# Patient Record
Sex: Female | Born: 1980 | Race: Black or African American | Hispanic: No | Marital: Single | Smoking: Former smoker
Health system: Southern US, Community
[De-identification: ages and names within clinical notes are randomized; demographics above are authoritative.]

## PROBLEM LIST (undated history)

## (undated) ENCOUNTER — Inpatient Hospital Stay (HOSPITAL_COMMUNITY): Payer: Self-pay

## (undated) DIAGNOSIS — R51 Headache: Secondary | ICD-10-CM

## (undated) DIAGNOSIS — O24419 Gestational diabetes mellitus in pregnancy, unspecified control: Secondary | ICD-10-CM

## (undated) DIAGNOSIS — R112 Nausea with vomiting, unspecified: Secondary | ICD-10-CM

## (undated) DIAGNOSIS — R519 Headache, unspecified: Secondary | ICD-10-CM

## (undated) DIAGNOSIS — K219 Gastro-esophageal reflux disease without esophagitis: Secondary | ICD-10-CM

## (undated) DIAGNOSIS — I10 Essential (primary) hypertension: Secondary | ICD-10-CM

## (undated) DIAGNOSIS — E1110 Type 2 diabetes mellitus with ketoacidosis without coma: Secondary | ICD-10-CM

## (undated) DIAGNOSIS — O139 Gestational [pregnancy-induced] hypertension without significant proteinuria, unspecified trimester: Secondary | ICD-10-CM

## (undated) DIAGNOSIS — E109 Type 1 diabetes mellitus without complications: Secondary | ICD-10-CM

## (undated) HISTORY — DX: Gestational diabetes mellitus in pregnancy, unspecified control: O24.419

## (undated) HISTORY — DX: Gestational (pregnancy-induced) hypertension without significant proteinuria, unspecified trimester: O13.9

## (undated) HISTORY — DX: Essential (primary) hypertension: I10

---

## 2005-09-27 ENCOUNTER — Emergency Department (HOSPITAL_COMMUNITY): Admission: EM | Admit: 2005-09-27 | Discharge: 2005-09-27 | Payer: Self-pay | Admitting: Family Medicine

## 2009-09-24 ENCOUNTER — Emergency Department (HOSPITAL_COMMUNITY): Admission: EM | Admit: 2009-09-24 | Discharge: 2009-09-25 | Payer: Self-pay | Admitting: Emergency Medicine

## 2009-09-29 ENCOUNTER — Emergency Department (HOSPITAL_COMMUNITY): Admission: EM | Admit: 2009-09-29 | Discharge: 2009-09-29 | Payer: Self-pay | Admitting: Emergency Medicine

## 2009-11-14 ENCOUNTER — Inpatient Hospital Stay (HOSPITAL_COMMUNITY): Admission: AD | Admit: 2009-11-14 | Discharge: 2009-11-14 | Payer: Self-pay | Admitting: Family Medicine

## 2009-11-14 ENCOUNTER — Ambulatory Visit: Payer: Self-pay | Admitting: Nurse Practitioner

## 2009-12-24 ENCOUNTER — Ambulatory Visit (HOSPITAL_COMMUNITY): Admission: RE | Admit: 2009-12-24 | Discharge: 2009-12-24 | Payer: Self-pay | Admitting: Family Medicine

## 2009-12-31 ENCOUNTER — Ambulatory Visit: Payer: Self-pay | Admitting: Obstetrics and Gynecology

## 2010-01-05 ENCOUNTER — Ambulatory Visit: Payer: Self-pay | Admitting: Obstetrics & Gynecology

## 2010-01-12 ENCOUNTER — Encounter: Admission: RE | Admit: 2010-01-12 | Discharge: 2010-01-23 | Payer: Self-pay | Admitting: Obstetrics and Gynecology

## 2010-01-12 ENCOUNTER — Ambulatory Visit: Payer: Self-pay | Admitting: Family Medicine

## 2010-01-19 ENCOUNTER — Encounter (INDEPENDENT_AMBULATORY_CARE_PROVIDER_SITE_OTHER): Payer: Self-pay | Admitting: *Deleted

## 2010-01-19 ENCOUNTER — Ambulatory Visit: Payer: Self-pay | Admitting: Obstetrics & Gynecology

## 2010-01-19 LAB — CONVERTED CEMR LAB
Hgb A1c MFr Bld: 6.5 % — ABNORMAL HIGH (ref <5.7)
TSH: 1.994 microintl units/mL (ref 0.350–4.500)

## 2010-01-26 ENCOUNTER — Encounter (INDEPENDENT_AMBULATORY_CARE_PROVIDER_SITE_OTHER): Payer: Self-pay | Admitting: *Deleted

## 2010-01-26 ENCOUNTER — Encounter
Admission: RE | Admit: 2010-01-26 | Discharge: 2010-03-09 | Payer: Self-pay | Source: Home / Self Care | Admitting: Obstetrics & Gynecology

## 2010-01-26 ENCOUNTER — Ambulatory Visit: Payer: Self-pay | Admitting: Obstetrics & Gynecology

## 2010-01-26 LAB — CONVERTED CEMR LAB
ALT: 15 units/L (ref 0–35)
AST: 14 units/L (ref 0–37)
Albumin: 3.7 g/dL (ref 3.5–5.2)
BUN: 4 mg/dL — ABNORMAL LOW (ref 6–23)
Calcium: 9.3 mg/dL (ref 8.4–10.5)
Chloride: 105 meq/L (ref 96–112)
Creatinine, Urine: 89.4 mg/dL
Hemoglobin: 12.5 g/dL (ref 12.0–15.0)
Potassium: 4 meq/L (ref 3.5–5.3)
RDW: 12.7 % (ref 11.5–15.5)
WBC: 10.7 10*3/uL — ABNORMAL HIGH (ref 4.0–10.5)

## 2010-01-27 ENCOUNTER — Encounter (INDEPENDENT_AMBULATORY_CARE_PROVIDER_SITE_OTHER): Payer: Self-pay | Admitting: *Deleted

## 2010-02-09 ENCOUNTER — Ambulatory Visit: Payer: Self-pay | Admitting: Obstetrics & Gynecology

## 2010-02-12 ENCOUNTER — Ambulatory Visit (HOSPITAL_COMMUNITY): Admission: RE | Admit: 2010-02-12 | Discharge: 2010-02-12 | Payer: Self-pay | Admitting: Obstetrics & Gynecology

## 2010-02-12 ENCOUNTER — Ambulatory Visit: Payer: Self-pay | Admitting: Obstetrics & Gynecology

## 2010-02-16 ENCOUNTER — Ambulatory Visit: Payer: Self-pay | Admitting: Obstetrics & Gynecology

## 2010-02-19 ENCOUNTER — Ambulatory Visit: Payer: Self-pay | Admitting: Obstetrics & Gynecology

## 2010-02-23 ENCOUNTER — Ambulatory Visit: Payer: Self-pay | Admitting: Family Medicine

## 2010-02-26 ENCOUNTER — Ambulatory Visit: Payer: Self-pay | Admitting: Obstetrics and Gynecology

## 2010-03-02 ENCOUNTER — Ambulatory Visit: Payer: Self-pay | Admitting: Obstetrics & Gynecology

## 2010-03-02 ENCOUNTER — Encounter (INDEPENDENT_AMBULATORY_CARE_PROVIDER_SITE_OTHER): Payer: Self-pay | Admitting: *Deleted

## 2010-03-05 ENCOUNTER — Ambulatory Visit: Payer: Self-pay | Admitting: Obstetrics and Gynecology

## 2010-03-09 ENCOUNTER — Encounter (INDEPENDENT_AMBULATORY_CARE_PROVIDER_SITE_OTHER): Payer: Self-pay | Admitting: *Deleted

## 2010-03-09 ENCOUNTER — Ambulatory Visit: Payer: Self-pay | Admitting: Obstetrics and Gynecology

## 2010-03-09 LAB — CONVERTED CEMR LAB
Chlamydia, DNA Probe: NEGATIVE
GC Probe Amp, Genital: NEGATIVE

## 2010-03-12 ENCOUNTER — Ambulatory Visit (HOSPITAL_COMMUNITY)
Admission: RE | Admit: 2010-03-12 | Discharge: 2010-03-12 | Payer: Self-pay | Source: Home / Self Care | Admitting: Obstetrics and Gynecology

## 2010-03-12 ENCOUNTER — Ambulatory Visit: Payer: Self-pay | Admitting: Obstetrics and Gynecology

## 2010-03-18 ENCOUNTER — Ambulatory Visit: Payer: Self-pay | Admitting: Obstetrics & Gynecology

## 2010-03-23 ENCOUNTER — Ambulatory Visit: Payer: Self-pay | Admitting: Obstetrics & Gynecology

## 2010-03-23 ENCOUNTER — Encounter: Admission: RE | Admit: 2010-03-23 | Discharge: 2010-05-26 | Payer: Self-pay | Source: Home / Self Care

## 2010-03-26 ENCOUNTER — Ambulatory Visit: Payer: Self-pay | Admitting: Obstetrics & Gynecology

## 2010-03-26 ENCOUNTER — Ambulatory Visit (HOSPITAL_COMMUNITY): Admission: RE | Admit: 2010-03-26 | Discharge: 2010-03-26 | Payer: Self-pay | Admitting: Family Medicine

## 2010-03-28 ENCOUNTER — Inpatient Hospital Stay (HOSPITAL_COMMUNITY)
Admission: AD | Admit: 2010-03-28 | Discharge: 2010-04-03 | Payer: Self-pay | Source: Home / Self Care | Attending: Obstetrics & Gynecology | Admitting: Obstetrics & Gynecology

## 2010-04-27 ENCOUNTER — Ambulatory Visit: Payer: Self-pay | Admitting: Obstetrics and Gynecology

## 2010-05-06 ENCOUNTER — Other Ambulatory Visit
Admission: RE | Admit: 2010-05-06 | Discharge: 2010-05-06 | Payer: Self-pay | Source: Home / Self Care | Admitting: Family Medicine

## 2010-05-06 ENCOUNTER — Ambulatory Visit
Admission: RE | Admit: 2010-05-06 | Discharge: 2010-05-06 | Payer: Self-pay | Source: Home / Self Care | Attending: Obstetrics and Gynecology | Admitting: Obstetrics and Gynecology

## 2010-05-07 ENCOUNTER — Encounter: Payer: Self-pay | Admitting: Obstetrics & Gynecology

## 2010-05-07 LAB — CONVERTED CEMR LAB: Trich, Wet Prep: NONE SEEN

## 2010-07-07 LAB — CBC
HCT: 34.1 % — ABNORMAL LOW (ref 36.0–46.0)
Hemoglobin: 13.5 g/dL (ref 12.0–15.0)
MCH: 32.9 pg (ref 26.0–34.0)
MCH: 33.1 pg (ref 26.0–34.0)
MCHC: 33 g/dL (ref 30.0–36.0)
MCHC: 33.2 g/dL (ref 30.0–36.0)
MCHC: 33.3 g/dL (ref 30.0–36.0)
MCV: 99.4 fL (ref 78.0–100.0)
MCV: 99.7 fL (ref 78.0–100.0)
Platelets: 187 10*3/uL (ref 150–400)
RDW: 13.5 % (ref 11.5–15.5)
RDW: 13.9 % (ref 11.5–15.5)
RDW: 13.9 % (ref 11.5–15.5)
WBC: 17.8 10*3/uL — ABNORMAL HIGH (ref 4.0–10.5)

## 2010-07-07 LAB — GLUCOSE, CAPILLARY
Glucose-Capillary: 103 mg/dL — ABNORMAL HIGH (ref 70–99)
Glucose-Capillary: 105 mg/dL — ABNORMAL HIGH (ref 70–99)
Glucose-Capillary: 106 mg/dL — ABNORMAL HIGH (ref 70–99)
Glucose-Capillary: 113 mg/dL — ABNORMAL HIGH (ref 70–99)
Glucose-Capillary: 113 mg/dL — ABNORMAL HIGH (ref 70–99)
Glucose-Capillary: 120 mg/dL — ABNORMAL HIGH (ref 70–99)
Glucose-Capillary: 122 mg/dL — ABNORMAL HIGH (ref 70–99)
Glucose-Capillary: 124 mg/dL — ABNORMAL HIGH (ref 70–99)
Glucose-Capillary: 131 mg/dL — ABNORMAL HIGH (ref 70–99)
Glucose-Capillary: 155 mg/dL — ABNORMAL HIGH (ref 70–99)
Glucose-Capillary: 169 mg/dL — ABNORMAL HIGH (ref 70–99)
Glucose-Capillary: 227 mg/dL — ABNORMAL HIGH (ref 70–99)
Glucose-Capillary: 49 mg/dL — ABNORMAL LOW (ref 70–99)
Glucose-Capillary: 51 mg/dL — ABNORMAL LOW (ref 70–99)
Glucose-Capillary: 63 mg/dL — ABNORMAL LOW (ref 70–99)
Glucose-Capillary: 63 mg/dL — ABNORMAL LOW (ref 70–99)
Glucose-Capillary: 77 mg/dL (ref 70–99)
Glucose-Capillary: 89 mg/dL (ref 70–99)

## 2010-07-07 LAB — COMPREHENSIVE METABOLIC PANEL
ALT: 14 U/L (ref 0–35)
AST: 31 U/L (ref 0–37)
Calcium: 7.9 mg/dL — ABNORMAL LOW (ref 8.4–10.5)
GFR calc Af Amer: 49 mL/min — ABNORMAL LOW (ref >60)
Sodium: 138 mEq/L (ref 135–145)
Total Protein: 5.1 g/dL — ABNORMAL LOW (ref 6.0–8.3)

## 2010-07-07 LAB — POCT URINALYSIS DIPSTICK
Bilirubin Urine: NEGATIVE
Glucose, UA: NEGATIVE mg/dL
Glucose, UA: NEGATIVE mg/dL
Ketones, ur: 15 mg/dL — AB
Nitrite: NEGATIVE
Nitrite: NEGATIVE
Protein, ur: NEGATIVE mg/dL
Specific Gravity, Urine: 1.02 (ref 1.005–1.030)
Urobilinogen, UA: 0.2 mg/dL (ref 0.0–1.0)
Urobilinogen, UA: 0.2 mg/dL (ref 0.0–1.0)

## 2010-07-07 LAB — URINALYSIS, MICROSCOPIC ONLY
Bilirubin Urine: NEGATIVE
Nitrite: NEGATIVE
Specific Gravity, Urine: 1.015 (ref 1.005–1.030)
Urobilinogen, UA: 0.2 mg/dL (ref 0.0–1.0)

## 2010-07-07 LAB — PROTEIN / CREATININE RATIO, URINE
Protein Creatinine Ratio: 0.27 — ABNORMAL HIGH (ref 0.00–0.15)
Total Protein, Urine: 11 mg/dL

## 2010-07-08 LAB — GLUCOSE, CAPILLARY: Glucose-Capillary: 124 mg/dL — ABNORMAL HIGH (ref 70–99)

## 2010-07-08 LAB — POCT URINALYSIS DIPSTICK
Bilirubin Urine: NEGATIVE
Bilirubin Urine: NEGATIVE
Glucose, UA: 100 mg/dL — AB
Glucose, UA: NEGATIVE mg/dL
Ketones, ur: 15 mg/dL — AB
Ketones, ur: 40 mg/dL — AB
Ketones, ur: 80 mg/dL — AB
Nitrite: NEGATIVE
Specific Gravity, Urine: 1.02 (ref 1.005–1.030)
Urobilinogen, UA: 0.2 mg/dL (ref 0.0–1.0)

## 2010-07-09 LAB — POCT URINALYSIS DIPSTICK
Bilirubin Urine: NEGATIVE
Bilirubin Urine: NEGATIVE
Bilirubin Urine: NEGATIVE
Bilirubin Urine: NEGATIVE
Glucose, UA: >=1000 mg/dL — AB
Glucose, UA: >=1000 mg/dL — AB
Glucose, UA: 500 mg/dL — AB
Ketones, ur: 40 mg/dL — AB
Ketones, ur: 80 mg/dL — AB
Ketones, ur: 40 mg/dL — AB
Nitrite: NEGATIVE
Nitrite: NEGATIVE
Protein, ur: NEGATIVE mg/dL
Specific Gravity, Urine: 1.025 (ref 1.005–1.030)
Specific Gravity, Urine: 1.02 (ref 1.005–1.030)
Specific Gravity, Urine: 1.025 (ref 1.005–1.030)
pH: 5.5 (ref 5.0–8.0)

## 2010-07-11 LAB — URINALYSIS, ROUTINE W REFLEX MICROSCOPIC
Glucose, UA: >1000 mg/dL — AB
Nitrite: NEGATIVE
Protein, ur: NEGATIVE mg/dL
Specific Gravity, Urine: >1.03 — ABNORMAL HIGH (ref 1.005–1.030)
Urobilinogen, UA: 0.2 mg/dL (ref 0.0–1.0)
pH: 6 (ref 5.0–8.0)

## 2010-07-11 LAB — URINE MICROSCOPIC-ADD ON

## 2010-07-11 LAB — URINE CULTURE: Colony Count: 10000

## 2010-07-13 LAB — BASIC METABOLIC PANEL
BUN: 6 mg/dL (ref 6–23)
Chloride: 107 mEq/L (ref 96–112)
Glucose, Bld: 219 mg/dL — ABNORMAL HIGH (ref 70–99)
Potassium: 3.3 mEq/L — ABNORMAL LOW (ref 3.5–5.1)

## 2010-07-13 LAB — DIFFERENTIAL
Basophils Relative: 0 % (ref 0–1)
Lymphocytes Relative: 21 % (ref 12–46)
Lymphs Abs: 2.9 10*3/uL (ref 0.7–4.0)
Monocytes Absolute: 0.5 10*3/uL (ref 0.1–1.0)
Monocytes Relative: 4 % (ref 3–12)
Neutro Abs: 10.2 10*3/uL — ABNORMAL HIGH (ref 1.7–7.7)

## 2010-07-13 LAB — URINE CULTURE

## 2010-07-13 LAB — URINALYSIS, ROUTINE W REFLEX MICROSCOPIC
Bilirubin Urine: NEGATIVE
Hgb urine dipstick: NEGATIVE
Specific Gravity, Urine: 1.026 (ref 1.005–1.030)
Urobilinogen, UA: 0.2 mg/dL (ref 0.0–1.0)

## 2010-07-13 LAB — CBC
Hemoglobin: 12.4 g/dL (ref 12.0–15.0)
MCHC: 34.8 g/dL (ref 30.0–36.0)
RBC: 3.58 MIL/uL — ABNORMAL LOW (ref 3.87–5.11)
WBC: 13.8 10*3/uL — ABNORMAL HIGH (ref 4.0–10.5)

## 2010-07-13 LAB — URINE MICROSCOPIC-ADD ON

## 2010-07-13 LAB — HEPATIC FUNCTION PANEL
ALT: 23 U/L (ref 0–35)
AST: 23 U/L (ref 0–37)
Alkaline Phosphatase: 62 U/L (ref 39–117)
Bilirubin, Direct: <0.1 mg/dL (ref 0.0–0.3)
Total Bilirubin: 0.5 mg/dL (ref 0.3–1.2)

## 2010-07-13 LAB — WET PREP, GENITAL

## 2010-07-13 LAB — GC/CHLAMYDIA PROBE AMP, GENITAL: Chlamydia, DNA Probe: NEGATIVE

## 2010-07-13 LAB — POCT PREGNANCY, URINE: Preg Test, Ur: POSITIVE

## 2012-01-06 LAB — CYTOLOGY - PAP: CYTOLOGY - PAP: NEGATIVE

## 2012-01-06 LAB — GLUCOSE TOLERANCE, 1 HOUR (50G) W/O FASTING: Glucose, 1 Hour GTT: 345

## 2012-01-10 LAB — OB RESULTS CONSOLE ABO/RH: RH Type: POSITIVE

## 2012-01-10 LAB — OB RESULTS CONSOLE HIV ANTIBODY (ROUTINE TESTING): HIV: NONREACTIVE

## 2012-01-10 LAB — OB RESULTS CONSOLE RPR: RPR: NONREACTIVE

## 2012-01-10 LAB — OB RESULTS CONSOLE RUBELLA ANTIBODY, IGM: Rubella: IMMUNE

## 2012-04-28 LAB — OB RESULTS CONSOLE RPR: RPR: NONREACTIVE

## 2012-04-28 LAB — CYSTIC FIBROSIS DIAGNOSTIC STUDY: Interpretation-CFDNA:: NEGATIVE

## 2012-05-22 ENCOUNTER — Encounter: Payer: Self-pay | Admitting: Obstetrics & Gynecology

## 2012-05-22 ENCOUNTER — Encounter: Payer: Medicaid Other | Attending: Obstetrics & Gynecology | Admitting: Dietician

## 2012-05-22 ENCOUNTER — Ambulatory Visit (INDEPENDENT_AMBULATORY_CARE_PROVIDER_SITE_OTHER): Payer: Medicaid Other | Admitting: Obstetrics & Gynecology

## 2012-05-22 VITALS — BP 134/78 | Temp 98.7°F | Ht 62.0 in | Wt 177.4 lb

## 2012-05-22 DIAGNOSIS — O24919 Unspecified diabetes mellitus in pregnancy, unspecified trimester: Secondary | ICD-10-CM

## 2012-05-22 DIAGNOSIS — M545 Low back pain: Secondary | ICD-10-CM

## 2012-05-22 DIAGNOSIS — O9989 Other specified diseases and conditions complicating pregnancy, childbirth and the puerperium: Secondary | ICD-10-CM

## 2012-05-22 DIAGNOSIS — Z713 Dietary counseling and surveillance: Secondary | ICD-10-CM | POA: Insufficient documentation

## 2012-05-22 DIAGNOSIS — O358XX0 Maternal care for other (suspected) fetal abnormality and damage, not applicable or unspecified: Secondary | ICD-10-CM

## 2012-05-22 DIAGNOSIS — O99810 Abnormal glucose complicating pregnancy: Secondary | ICD-10-CM | POA: Insufficient documentation

## 2012-05-22 DIAGNOSIS — O34219 Maternal care for unspecified type scar from previous cesarean delivery: Secondary | ICD-10-CM

## 2012-05-22 LAB — POCT URINALYSIS DIP (DEVICE)
Bilirubin Urine: NEGATIVE
Glucose, UA: NEGATIVE mg/dL
Hgb urine dipstick: NEGATIVE
Nitrite: NEGATIVE
Specific Gravity, Urine: 1.025 (ref 1.005–1.030)

## 2012-05-22 NOTE — Patient Instructions (Signed)
Return to clinic for any obstetric concerns or go to [facility] for evaluation  

## 2012-05-22 NOTE — Progress Notes (Signed)
Transfer of care at [redacted]w[redacted]d from [facility]. Records reviewed. Patient has Class B DM, fetal left renal agenesis, chronic back pain requiring Vicodin (LFTs elevated in 02/2012).  Patient has infrequent CBG checks, wants to be "written out of work so I can focus on my diabetes".  She was told that we do not write patients out of work for this indication, offered further diabetic education and counseling.  Patient also will be scheduled for MFM ultrasound and consult given report of fetal left renal agenesis.  Recommended that patient start 2x/week testing today as per our protocol but she refused due to transportation issues; BPP added on to be done at MFM.  Patient seemed to be very upset during the entire encounter.  No other complaints or concerns.  Fetal movement and labor precautions reviewed. Will follow up with [facility] May, CDE recommendations.

## 2012-05-22 NOTE — Progress Notes (Signed)
Patient complains of pain in her back and legs that make it difficult to work.  She is a diabetic.  She has been having bad headaches on a daily basis for a couple of weeks.   The patient states that she was told her baby only has one kidney.  She is confused about this and needs more information.

## 2012-05-22 NOTE — Progress Notes (Signed)
Diabetes Education:  Seen for increased blood glucose and infrequent testing.  Has recently been hospitalized and placed on insulin regimen using NPH and Novolog.  Today she reports that she is having to work and that the job site does not provide her the time for meals, snacks and testing her blood glucose.  05/21/2012  210, 168  (Not working and only 2 entries for the day)  No readings for 05/21/2011 (Day off)  05/19/2012 139, 148, 05/18/2012: 146, 80, 104, 05/17/2012: 192, 198, 117, 118.  Tearful and insisting that at this time, she is not able to work and focus on taking care of her blood glucose/diet and insulin.  Provided her with handout "Nutrition, Diabetes and Pregnancy", which said they provided her in the hospital.  Provided Carb Counting Guide.  Ask that she attempt to plan for meals and snacks, check her glucose and to just not take the insulin or to take it and not check her blood glucose.  Maggie Darry Kelnhofer, RN, RD, CDE

## 2012-05-22 NOTE — Progress Notes (Signed)
Appointment for U/S, NST and consult scheduled with MFM on 05/31/12 at 215pm. Patient states she cannot come this week for an appointment due to Medicaid transportation issues.

## 2012-05-23 ENCOUNTER — Encounter (HOSPITAL_COMMUNITY): Payer: Self-pay | Admitting: Obstetrics & Gynecology

## 2012-05-24 ENCOUNTER — Other Ambulatory Visit (HOSPITAL_COMMUNITY): Payer: Medicaid Other

## 2012-05-24 DIAGNOSIS — O24919 Unspecified diabetes mellitus in pregnancy, unspecified trimester: Secondary | ICD-10-CM

## 2012-05-24 DIAGNOSIS — O34219 Maternal care for unspecified type scar from previous cesarean delivery: Secondary | ICD-10-CM

## 2012-05-24 DIAGNOSIS — O26899 Other specified pregnancy related conditions, unspecified trimester: Secondary | ICD-10-CM

## 2012-05-24 DIAGNOSIS — O358XX0 Maternal care for other (suspected) fetal abnormality and damage, not applicable or unspecified: Secondary | ICD-10-CM

## 2012-05-29 ENCOUNTER — Telehealth: Payer: Self-pay | Admitting: Obstetrics and Gynecology
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
## 2012-05-29 NOTE — Telephone Encounter (Signed)
Patient left a message stating that she missed our call earlier and would like a call back.

## 2012-05-29 NOTE — Telephone Encounter (Signed)
Patient called in and left message on nurse line stating she wants to speak to Dr. Macon Large about her health issues. Called patient back and left message for her to call clinic back for more information.

## 2012-05-31 ENCOUNTER — Ambulatory Visit (HOSPITAL_COMMUNITY): Payer: Medicaid Other

## 2012-05-31 ENCOUNTER — Other Ambulatory Visit: Payer: Self-pay | Admitting: Obstetrics & Gynecology

## 2012-05-31 ENCOUNTER — Ambulatory Visit (HOSPITAL_COMMUNITY): Admission: RE | Admit: 2012-05-31 | Payer: Medicaid Other | Source: Ambulatory Visit

## 2012-05-31 DIAGNOSIS — O24919 Unspecified diabetes mellitus in pregnancy, unspecified trimester: Secondary | ICD-10-CM

## 2012-06-01 ENCOUNTER — Telehealth: Payer: Self-pay | Admitting: *Deleted

## 2012-06-01 NOTE — Telephone Encounter (Signed)
Pt left a message stating that she spoke with a nurse earlier this week about her back injury. She was told to get her records from the provider she saw about her back and bring to her next appt. She tried to get the records but her provider wouldn't release them to her. They told her she needs to have [place] request them. Pt would like a call back so she can give [place] information about who to send the release to.

## 2012-06-02 NOTE — Telephone Encounter (Signed)
Patient has an appointment on Monday. Will have her sign release on Monday.

## 2012-06-05 ENCOUNTER — Encounter: Payer: Medicaid Other | Attending: Obstetrics & Gynecology | Admitting: Dietician

## 2012-06-05 ENCOUNTER — Ambulatory Visit (INDEPENDENT_AMBULATORY_CARE_PROVIDER_SITE_OTHER): Payer: Medicaid Other | Admitting: Obstetrics & Gynecology

## 2012-06-05 VITALS — BP 109/86 | Temp 98.0°F | Wt 180.0 lb

## 2012-06-05 DIAGNOSIS — O24919 Unspecified diabetes mellitus in pregnancy, unspecified trimester: Secondary | ICD-10-CM

## 2012-06-05 DIAGNOSIS — M545 Low back pain: Secondary | ICD-10-CM

## 2012-06-05 DIAGNOSIS — O99810 Abnormal glucose complicating pregnancy: Secondary | ICD-10-CM | POA: Insufficient documentation

## 2012-06-05 DIAGNOSIS — O34219 Maternal care for unspecified type scar from previous cesarean delivery: Secondary | ICD-10-CM

## 2012-06-05 DIAGNOSIS — Z713 Dietary counseling and surveillance: Secondary | ICD-10-CM | POA: Insufficient documentation

## 2012-06-05 LAB — POCT URINALYSIS DIP (DEVICE)
Glucose, UA: 500 mg/dL — AB
Nitrite: NEGATIVE
Protein, ur: 30 mg/dL — AB
Urobilinogen, UA: 1 mg/dL (ref 0.0–1.0)

## 2012-06-05 MED ORDER — INSULIN NPH (HUMAN) (ISOPHANE) 100 UNIT/ML ~~LOC~~ SUSP: SUBCUTANEOUS | Status: DC

## 2012-06-05 MED ORDER — INSULIN ASPART 100 UNIT/ML ~~LOC~~ SOLN: 18.0000 [IU] | Freq: Three times a day (TID) | SUBCUTANEOUS | Status: DC

## 2012-06-05 MED ORDER — FLUCONAZOLE 150 MG PO TABS: 150.0000 mg | ORAL_TABLET | Freq: Once | ORAL | Status: DC

## 2012-06-05 NOTE — Progress Notes (Signed)
US- Ob detail, BPP and NST @ MFM on 06/09/12 @ 1315

## 2012-06-05 NOTE — Telephone Encounter (Signed)
Patient in clinic- brought records with her, states she got them from her lawyer- records copied for scan

## 2012-06-05 NOTE — Progress Notes (Signed)
Pulse 93 Edema trace in ankles. Vaginal d/c as thin clear with itch.
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
## 2012-06-05 NOTE — Patient Instructions (Signed)

## 2012-06-05 NOTE — Progress Notes (Signed)
.   Diabetes Education:  Seen today for high blood glucose levels.  Was hospitalized at [facility] and placed on insulin to control her blood glucose.  Currently, unsure of the names of her insulin and does not have the insulin bottles with her.  Current dose total is at 132 units or 1.6 units insulin per Kg.  She says she is on Humalog and NPH insulins.  She is mixing the insulin in the AM.  Given her high readings of 2-3 times per day being greater than 200 mg/dl, I'm wondering why the large doses of NPH and the 12 units of Novolog before meals are not covering her glucose levels.  Perhaps, she is mixing Humalog with Novolin N insulin.  This does not work.  These are two different insulin manufactures.  Dose adjusted to NPH 14 units in the AM, 14 units at HS and 18 units of Humalog before each meal.  Ask that she not mix insulins in the AM until she comes to clinic on 06/12/2012.  To call clinic nurse if continues to have problems.  Maggie Kiarrah Rausch, RN, RD, CDE

## 2012-06-06 ENCOUNTER — Ambulatory Visit (HOSPITAL_COMMUNITY): Payer: Medicaid Other

## 2012-06-09 ENCOUNTER — Ambulatory Visit (HOSPITAL_COMMUNITY): Payer: Medicaid Other

## 2012-06-12 ENCOUNTER — Other Ambulatory Visit: Payer: Self-pay | Admitting: Obstetrics and Gynecology

## 2012-06-12 ENCOUNTER — Ambulatory Visit (INDEPENDENT_AMBULATORY_CARE_PROVIDER_SITE_OTHER): Payer: Medicaid Other | Admitting: Obstetrics and Gynecology

## 2012-06-12 ENCOUNTER — Encounter: Payer: Self-pay | Admitting: Obstetrics and Gynecology

## 2012-06-12 VITALS — BP 127/86 | Temp 99.1°F | Wt 185.6 lb

## 2012-06-12 DIAGNOSIS — O34219 Maternal care for unspecified type scar from previous cesarean delivery: Secondary | ICD-10-CM

## 2012-06-12 DIAGNOSIS — M545 Low back pain: Secondary | ICD-10-CM

## 2012-06-12 DIAGNOSIS — O358XX0 Maternal care for other (suspected) fetal abnormality and damage, not applicable or unspecified: Secondary | ICD-10-CM

## 2012-06-12 DIAGNOSIS — O9989 Other specified diseases and conditions complicating pregnancy, childbirth and the puerperium: Secondary | ICD-10-CM

## 2012-06-12 DIAGNOSIS — O24919 Unspecified diabetes mellitus in pregnancy, unspecified trimester: Secondary | ICD-10-CM

## 2012-06-12 NOTE — Progress Notes (Signed)
P=100,  states has been having headaches for last 2- 3 days, states not taking any medicine because was told not to take tylenol. States edema has gotten worse in feet, pelvic pressure still for a few weeks,

## 2012-06-12 NOTE — Progress Notes (Signed)
NST reviewed and reactive. CBG F- 94-157 (5/7 abnormal) 2hr pB 119-190 (3/5 abnormal) 2hr p L 98-151 (2/6 abnormal) 2 hr p D 100-220 (4/6 abnormal). Will increase NPH to 16 units in am and qHS. FM/PTL precautions reviewed. Patient desires repeat c-section. Will schedule repeat c-section at 39 weeks. Patient to have cultures done at next visit. Left a little upset due to the fact that disability papers have not been filled out yet.

## 2012-06-13 ENCOUNTER — Encounter: Payer: Self-pay | Admitting: *Deleted

## 2012-06-13 LAB — POCT URINALYSIS DIP (DEVICE)
Ketones, ur: >=160 mg/dL — AB
Leukocytes, UA: NEGATIVE
Protein, ur: 30 mg/dL — AB
Specific Gravity, Urine: >=1.03 (ref 1.005–1.030)
pH: 6 (ref 5.0–8.0)

## 2012-06-14 ENCOUNTER — Telehealth: Payer: Self-pay | Admitting: *Deleted

## 2012-06-14 NOTE — Telephone Encounter (Addendum)
Called pt and left message that I will be needing additional information from her Orthopedic doctor in order to complete the forms for the [employer]. She will need to sign a release of information form when she goes to [facility]FM on 2/21.  I have left a message for her DSS worker Cheryl Parker so that she will know the reason for the delay of paperwork completion. She can call me if she has additional questions.  Pt called back @ 1300 and stated that she will come in today to sign release of information form to obtain records from [facility].

## 2012-06-16 ENCOUNTER — Ambulatory Visit (HOSPITAL_COMMUNITY)
Admission: RE | Admit: 2012-06-16 | Discharge: 2012-06-16 | Disposition: A | Discharge status: Home / Self Care | Payer: Medicaid Other | Source: Ambulatory Visit | Attending: Obstetrics & Gynecology | Admitting: Obstetrics & Gynecology

## 2012-06-16 ENCOUNTER — Encounter: Payer: Self-pay | Admitting: Obstetrics & Gynecology

## 2012-06-16 ENCOUNTER — Ambulatory Visit (HOSPITAL_COMMUNITY): Payer: Medicaid Other

## 2012-06-16 VITALS — BP 131/85 | HR 95 | Wt 187.0 lb

## 2012-06-16 DIAGNOSIS — O24919 Unspecified diabetes mellitus in pregnancy, unspecified trimester: Secondary | ICD-10-CM

## 2012-06-16 DIAGNOSIS — Z363 Encounter for antenatal screening for malformations: Secondary | ICD-10-CM | POA: Insufficient documentation

## 2012-06-16 DIAGNOSIS — M545 Low back pain: Secondary | ICD-10-CM

## 2012-06-16 DIAGNOSIS — O358XX0 Maternal care for other (suspected) fetal abnormality and damage, not applicable or unspecified: Secondary | ICD-10-CM

## 2012-06-16 DIAGNOSIS — Z1389 Encounter for screening for other disorder: Secondary | ICD-10-CM | POA: Insufficient documentation

## 2012-06-16 DIAGNOSIS — O34219 Maternal care for unspecified type scar from previous cesarean delivery: Secondary | ICD-10-CM

## 2012-06-16 NOTE — Consult Note (Signed)
MFM consult  32 yr old G2P1001 at [redacted]w[redacted]d with type II diabetes and fetus with left renal agenesis referred by Dr. Macon Large for fetal ultrasound and consult.  Ultrasound today shows: estimated fetal weight is in the 68th%. Posterior placenta without evidence of previa. Normal amniotic fluid index. The left kidney is not seen. The remainder of the limited anatomy survey is normal. The anatomic survey was limited by advanced gestational age. Normal biophysical profile of 8/8.  I counseled the patient as follows:  1. Appropriate fetal growth. 2. Left renal agenesis: - discussed right kidney appears normal; discussed a person only needs one functioning kidney and the expected outcome is good if the right kidney functions properly as we would expect - do not recommend any change in delivery timing or pregnancy management - referred to Pediatric Nephrology- recommend neonatal follow up as well - recommend inform Pediatrics at delivery - prognosis is good 3. Type II diabetes: Discussed increased risks in pregnancy include: fetal macrosomia, shoulder dystocia, and increased risk of requiring a Cesarean delivery. There is also an increased risk of developing preeclampsia during the pregnancy. There is an increased risk of congenital malformations related to level of diabetic control in the first trimester. I discussed there is an increased risk of stillbirth, neonatal hypoglycemia, neonatal jaundice, and neonatal electrolyte disturbances. I recommend strict glucose control maintaining fasting blood sugars <90 and 2 hour postprandial values <120.  I recommend antenatal testing with either weekly biophysical profiles or twice weekly nonstress tests and weekly amniotic fluid index.  I recommend delivery by estimated due date but not prior to 39 weeks in the absence of other complications. Patient is scheduled for repeat C section at 39 weeks.  I spent 30 minutes in face to face consultation with the patient  in addition to time spent on the ultrasound.  Eulis Foster, MD

## 2012-06-16 NOTE — Progress Notes (Signed)
[facility]  Indication: 32 yr old G2P1001 at [redacted]w[redacted]d with type II diabetes and fetus with left renal agenesis for fetal ultrasound.  Findings: 1. Single intrauterine pregnancy. 2. Estimated fetal weight is in the 68th%. 3. Posterior placenta without evidence of previa. 4. Normal amniotic fluid index. 5. The left kidney is not seen. 6. The remainder of the limited anatomy survey is normal. The anatomic survey was limited by advanced gestational age. 7. Normal biophysical profile of 8/8.  Recommendations: 1. Appropriate fetal growth. 2. Left renal agenesis: - see consult letter - referred to Pediatric Nephrology - recommend inform Pediatrics at delivery - prognosis is good 3. Type II diabetes: Discussed increased risks in pregnancy include: fetal macrosomia, shoulder dystocia, and increased risk of requiring a Cesarean delivery. There is also an increased risk of developing preeclampsia during the pregnancy. There is an increased risk of congenital malformations related to level of diabetic control in the first trimester. I discussed there is an increased risk of stillbirth, neonatal hypoglycemia, neonatal jaundice, and neonatal electrolyte disturbances. I recommend strict glucose control maintaining fasting blood sugars <90 and 2 hour postprandial values <120.  I recommend antenatal testing with either weekly biophysical profiles or twice weekly nonstress tests and weekly amniotic fluid index.  I recommend delivery by estimated due date but not prior to 39 weeks in the absence of other complications. Patient is scheduled for repeat C section at 39 weeks.  Eulis Foster, MD

## 2012-06-19 ENCOUNTER — Encounter: Payer: Self-pay | Admitting: Obstetrics and Gynecology

## 2012-06-19 ENCOUNTER — Ambulatory Visit (INDEPENDENT_AMBULATORY_CARE_PROVIDER_SITE_OTHER): Payer: Medicaid Other | Admitting: Obstetrics and Gynecology

## 2012-06-19 VITALS — BP 135/90 | Temp 98.7°F | Wt 185.8 lb

## 2012-06-19 DIAGNOSIS — O24913 Unspecified diabetes mellitus in pregnancy, third trimester: Secondary | ICD-10-CM

## 2012-06-19 DIAGNOSIS — O358XX0 Maternal care for other (suspected) fetal abnormality and damage, not applicable or unspecified: Secondary | ICD-10-CM

## 2012-06-19 DIAGNOSIS — O24919 Unspecified diabetes mellitus in pregnancy, unspecified trimester: Secondary | ICD-10-CM

## 2012-06-19 DIAGNOSIS — O34219 Maternal care for unspecified type scar from previous cesarean delivery: Secondary | ICD-10-CM

## 2012-06-19 DIAGNOSIS — M545 Low back pain: Secondary | ICD-10-CM

## 2012-06-19 DIAGNOSIS — O358XX1 Maternal care for other (suspected) fetal abnormality and damage, fetus 1: Secondary | ICD-10-CM

## 2012-06-19 DIAGNOSIS — O9989 Other specified diseases and conditions complicating pregnancy, childbirth and the puerperium: Secondary | ICD-10-CM

## 2012-06-19 LAB — POCT URINALYSIS DIP (DEVICE)
Hgb urine dipstick: NEGATIVE
Protein, ur: 100 mg/dL — AB
Specific Gravity, Urine: >=1.03 (ref 1.005–1.030)
Urobilinogen, UA: 0.2 mg/dL (ref 0.0–1.0)

## 2012-06-19 NOTE — Progress Notes (Signed)
NST reviewed and reactive. CBG: f 80-125 (1 normal value) 2 hr p B 71-204 (2/6 abnormal) 2 hr p L105-148 (2/5 normal) 2 hr p D 91-257. Upon review of her log book, patient is not consistently checking 2 hr post meal, at times CBGs are checked 30 minutes after. Education provided. No change to insulin level right now. Cultures done.

## 2012-06-21 ENCOUNTER — Encounter (HOSPITAL_COMMUNITY): Payer: Self-pay

## 2012-06-22 ENCOUNTER — Other Ambulatory Visit: Payer: Medicaid Other

## 2012-06-22 ENCOUNTER — Telehealth (HOSPITAL_COMMUNITY): Payer: Self-pay | Admitting: MS"

## 2012-06-22 NOTE — Telephone Encounter (Addendum)
Called patient to discuss referral for pediatric urology given unilateral renal agenesis. Explained that [facility] pediatric urologist come to [facility] in [place] once per month for prenatal consultations. However, the next scheduled appointments are for 07/18/12, which is after patient's [facility] and scheduled delivery date. Reviewed with the patient that we could attempt to schedule an earlier date with pediatric urology or patient could wait until after delivery. The patient's pediatrician would be able to facilitate follow-up. Discussed with Ms. Casciano that per discussion with Dr. Vincenza Hews, she did not feel that a prenatal consultation was required but wanted patient to have the option, if she desired one. Ms. Quintin stated that she was comfortable waiting for postnatal follow-up of the unilateral renal agenesis.   Clydie Braun Deboraha Goar 06/22/2012 1:17 PM

## 2012-06-23 ENCOUNTER — Ambulatory Visit (INDEPENDENT_AMBULATORY_CARE_PROVIDER_SITE_OTHER): Payer: Medicaid Other | Admitting: *Deleted

## 2012-06-23 VITALS — BP 137/82 | Wt 187.6 lb

## 2012-06-23 DIAGNOSIS — O24913 Unspecified diabetes mellitus in pregnancy, third trimester: Secondary | ICD-10-CM

## 2012-06-23 DIAGNOSIS — O24919 Unspecified diabetes mellitus in pregnancy, unspecified trimester: Secondary | ICD-10-CM

## 2012-06-23 NOTE — Progress Notes (Signed)
P = 83   Pt did not bring log book but had meter- fasting today= 109. There were only 2-3 readings per day noted. Pt stated that she was told to check CBG 3hrs after eating. I re-educated that it needs to be checked 2 hrs after eating as well as fasting. She should have 4 readings per day. Pt instructed to bring meter to visit on 06/26/12- voiced understanding.

## 2012-06-26 ENCOUNTER — Ambulatory Visit (INDEPENDENT_AMBULATORY_CARE_PROVIDER_SITE_OTHER): Payer: Medicaid Other | Admitting: Obstetrics & Gynecology

## 2012-06-26 VITALS — BP 126/86 | Temp 98.3°F | Wt 187.2 lb

## 2012-06-26 DIAGNOSIS — O24919 Unspecified diabetes mellitus in pregnancy, unspecified trimester: Secondary | ICD-10-CM

## 2012-06-26 DIAGNOSIS — O24913 Unspecified diabetes mellitus in pregnancy, third trimester: Secondary | ICD-10-CM

## 2012-06-26 LAB — POCT URINALYSIS DIP (DEVICE)
Hgb urine dipstick: NEGATIVE
Ketones, ur: NEGATIVE mg/dL
Leukocytes, UA: NEGATIVE
Protein, ur: 30 mg/dL — AB
pH: 6.5 (ref 5.0–8.0)

## 2012-06-26 NOTE — Progress Notes (Signed)
Pt states she has H/A today- pain scale 7.  Tylenol 500 mg given po.   Repeat C/S scheduled 07/05/12

## 2012-06-26 NOTE — Progress Notes (Signed)
P = 91 

## 2012-06-26 NOTE — Progress Notes (Signed)
NST reactive today. Brought her book but has not recorded in book since 2/23, encouraged to do so. 166 at 0948 today. States fasting 93 today, wil not change doses.

## 2012-06-26 NOTE — Patient Instructions (Addendum)
Cesarean Delivery  Cesarean delivery is the birth of a baby through a cut (incision) in the abdomen and womb (uterus).  LET YOUR CAREGIVER KNOW ABOUT:  Complicationsinvolving the pregnancy.  Allergies.  Medicines taken including herbs, eyedrops, over-the-counter medicines, and creams.  Use of steroids (by mouth or creams).  Previous problems with anesthetics or numbing medicine.  Previous surgery.  History of blood clots.  History of bleeding or blood problems.  Other health problems. RISKS AND COMPLICATIONS   Bleeding.  Infection.  Blood clots.  Injury to surrounding organs.  Anesthesia problems.  Injury to the baby. BEFORE THE PROCEDURE   A tube (Foley catheter) will be placed in your bladder. The Foley catheter drains the urine from your bladder into a bag. This keeps your bladder empty during surgery.  An intravenous access tube (IV) will be placed in your arm.  Hair may be removed from your pubic area and your lower abdomen. This is to prevent infection in the incision site.  You may be given an antacid medicine to drink. This will prevent acid contents in your stomach from going into your lungs if you vomit during the surgery.  You may be given an antibiotic medicine to prevent infection. PROCEDURE   You may be given medicine to numb the lower half of your body (regional anesthetic). If you were in labor, you may have already had an epidural in place which can be used in both labor and cesarean delivery. You may possibly be given medicine to make you sleep (general anesthetic) though this is not as common.  An incision will be made in your abdomen that extends to your uterus. There are 2 basic kinds of incisions:  The horizontal (transverse) incision. Horizontal incisions are used for most routine cesarean deliveries.  The vertical (up and down) incision. This is less commonly used. This is most often reserved for women who have a serious complication  (extreme prematurity) or under emergency situations.  The horizontal and vertical incisions may both be used at the same time. However, this is very uncommon.  Your baby will then be delivered. AFTER THE PROCEDURE   If you were awake during the surgery, you will see your baby right away. If you were asleep, you will see your baby as soon as you are awake.  You may breastfeed your baby after surgery.  You may be able to get up and walk the same day as the surgery. If you need to stay in bed for a period of time, you will receive help to turn, cough, and take deep breaths after surgery. This helps prevent lung problems such as pneumonia.  Do not get out of bed alone the first time after surgery. You will need help getting out of bed until you are able to do this by yourself.  You may be able to shower the day after your cesarean delivery. After the bandage (dressing) is taken off the incision site, a nurse will assist you to shower, if you like.  You will have pneumatic compressing hose placed on your feet or lower legs. These hose are used to prevent blood clots. When you are up and walking regularly, they will no longer be necessary.  Do not cross your legs when you sit.  Save any blood clots that you pass. If you pass a clot while on the toilet, do not flush it. Call for the nurse. Tell the nurse if you think you are bleeding too much or passing too many   clots.  Start drinking liquids and eating food as directed by your caregiver. If your stomach is not ready, drinking and eating too soon can cause an increase in bloating and swelling of your intestine and abdomen. This is very uncomfortable.  You will be given medicine as needed. Let your caregivers know if you are hurting. They want you to be comfortable. You may also be given an antibiotic to prevent an infection.  Your IV will be taken out when you are drinking a reasonable amount of fluids. The Foley catheter is taken out when  you are up and walking.  If your blood type is Rh negative and your baby's blood type is Rh positive, you will be given a shot of anti-D immune globulin. This shot prevents you from having Rh problems with a future pregnancy. You should get the shot even if you had your tubes tied (tubal ligation).  If you are allowed to take the baby for a walk, place the baby in the bassinet and push it. Do not carry your baby in your arms. Document Released: 04/12/2005 Document Revised: 07/05/2011 Document Reviewed: 08/07/2010 [employer] Patient Information 2013 [employer].  

## 2012-06-28 ENCOUNTER — Encounter: Payer: Self-pay | Admitting: Obstetrics and Gynecology

## 2012-06-28 DIAGNOSIS — O99820 Streptococcus B carrier state complicating pregnancy: Secondary | ICD-10-CM | POA: Insufficient documentation

## 2012-06-28 NOTE — Progress Notes (Signed)
2/28- NST reviewed and reactive 

## 2012-06-29 ENCOUNTER — Ambulatory Visit (INDEPENDENT_AMBULATORY_CARE_PROVIDER_SITE_OTHER): Payer: Medicaid Other | Admitting: *Deleted

## 2012-06-29 VITALS — BP 143/79 | Wt 190.1 lb

## 2012-06-29 DIAGNOSIS — O24913 Unspecified diabetes mellitus in pregnancy, third trimester: Secondary | ICD-10-CM

## 2012-06-29 DIAGNOSIS — O24919 Unspecified diabetes mellitus in pregnancy, unspecified trimester: Secondary | ICD-10-CM

## 2012-06-29 NOTE — Progress Notes (Signed)
NST reviewed and reactive.  Carolyn L. Harraway-Smith, M.D., FACOG    

## 2012-06-29 NOTE — Progress Notes (Signed)
P = 91 

## 2012-06-30 ENCOUNTER — Encounter (HOSPITAL_COMMUNITY): Payer: Self-pay

## 2012-07-03 ENCOUNTER — Inpatient Hospital Stay (HOSPITAL_COMMUNITY): Admission: RE | Admit: 2012-07-03 | Payer: Medicaid Other | Source: Ambulatory Visit

## 2012-07-03 ENCOUNTER — Other Ambulatory Visit: Payer: Self-pay | Admitting: Obstetrics & Gynecology

## 2012-07-03 ENCOUNTER — Ambulatory Visit (INDEPENDENT_AMBULATORY_CARE_PROVIDER_SITE_OTHER): Payer: Medicaid Other | Admitting: Obstetrics and Gynecology

## 2012-07-03 ENCOUNTER — Encounter: Payer: Self-pay | Admitting: Obstetrics and Gynecology

## 2012-07-03 VITALS — BP 128/78 | Temp 98.1°F | Wt 189.7 lb

## 2012-07-03 DIAGNOSIS — O34219 Maternal care for unspecified type scar from previous cesarean delivery: Secondary | ICD-10-CM

## 2012-07-03 DIAGNOSIS — O9989 Other specified diseases and conditions complicating pregnancy, childbirth and the puerperium: Secondary | ICD-10-CM

## 2012-07-03 DIAGNOSIS — O24919 Unspecified diabetes mellitus in pregnancy, unspecified trimester: Secondary | ICD-10-CM

## 2012-07-03 DIAGNOSIS — O99820 Streptococcus B carrier state complicating pregnancy: Secondary | ICD-10-CM

## 2012-07-03 DIAGNOSIS — Z22330 Carrier of Group B streptococcus: Secondary | ICD-10-CM

## 2012-07-03 DIAGNOSIS — O358XX0 Maternal care for other (suspected) fetal abnormality and damage, not applicable or unspecified: Secondary | ICD-10-CM

## 2012-07-03 DIAGNOSIS — O09899 Supervision of other high risk pregnancies, unspecified trimester: Secondary | ICD-10-CM

## 2012-07-03 DIAGNOSIS — O26893 Other specified pregnancy related conditions, third trimester: Secondary | ICD-10-CM

## 2012-07-03 DIAGNOSIS — O24913 Unspecified diabetes mellitus in pregnancy, third trimester: Secondary | ICD-10-CM

## 2012-07-03 DIAGNOSIS — O358XX1 Maternal care for other (suspected) fetal abnormality and damage, fetus 1: Secondary | ICD-10-CM

## 2012-07-03 LAB — POCT URINALYSIS DIP (DEVICE)
Bilirubin Urine: NEGATIVE
Glucose, UA: NEGATIVE mg/dL
Ketones, ur: 40 mg/dL — AB
Specific Gravity, Urine: >=1.03 (ref 1.005–1.030)

## 2012-07-03 NOTE — Progress Notes (Signed)
Rpt C/S on 07/05/12.

## 2012-07-03 NOTE — Progress Notes (Signed)
NST reviewed and reactive. Patient did not bring meter or log book today. Reports highest postprandial 175. FM/labor precautions reviewed. Reviewed risks and benefits of cesarean section including but not limited to risks of bleeding, infection and damage to adjacent organs. Patient verbalized understanding and all questions were answered. Patient was informed of fetal diagnosis of cleft lip/palate and was provided with information on topic. Patient was completely unaware of that diagnosis.

## 2012-07-03 NOTE — Progress Notes (Signed)
Pulse- 100  Patient states she forget her blood sugar book today

## 2012-07-03 NOTE — Patient Instructions (Signed)
Breastfeeding, Cleft Lip, Cleft Palate Cleft palate and cleft lip are some of the most common birth defects that happen as a baby is developing in the womb. A cleft, or opening, in either the palate (roof of the mouth) or lip can happen together or separately. Both can be corrected through surgery. Both conditions can prevent babies from breastfeeding. This is because the baby cannot form a good seal around the nipple and areola with his or her mouth. Because of this, the baby has difficulty getting enough milk out of the breast. There are special benefits of breastfeeding for babies with cleft defects:  Babies with a cleft palate tend to have more ear infections, because fluid can more easily accumulate in the middle ear. The immune properties of human milk protect the baby against many kinds of infections, which is important as the infant faces surgery.  Human milk is a body fluid that is less irritating to a baby's tissues than formula.  Breastfeeding is calming and soothing for mother and baby. It gives the benefit of skin-to-skin contact with the mother. It is beneficial for an infant to suck for comfort, as well as for nourishment.  The breast is soft and flexible, and it molds itself in order to compensate for abnormalities of the lip and mouth.  Breastfeeding allows the baby to have increased control over the flow of milk. Cleft palate can happen on one or both sides of a baby's mouth. It can be partial or complete. Right after birth, a mother whose baby has a cleft palate can try to breastfeed her baby. If the cleft is large enough to prevent the infant from nursing effectively, the mother can start expressing her milk right away, to keep up her supply. Even if the baby cannot latch on well to the mother's breast, the baby can be fed breast milk by cup or spoon. In some hospitals, babies with cleft palate are fitted with a mouthpiece called an obturator. It fits into the cleft and seals it, for  easier feeding. After surgery, the baby should be able to exclusively breastfeed, if the mother's milk supply has been maintained. Begin breastfeeding as soon as possible after birth. Try different positions, while the breast is still soft and pliable. An upright position is often easiest for a baby with a cleft palate, because milk is less likely to leak into the baby's nose. The mother can use her thumb or breast to help fill in the opening left by the lip, in order to form a seal around the breast. It is important to be patient and to find the way that breastfeeding will work best for you and your baby. With cleft lip repair, breastfeeding may only need to be stopped for a few hours during the infant's surgery and recovery period.  Talk with a lactation consultant in the hospital for assistance as soon as possible. Human milk and early breastfeeding is still best for your baby's health. Document Released: 08/10/2004 Document Revised: 07/05/2011 Document Reviewed: 04/02/2009 [facility] Patient Information 2013 [place], [place].

## 2012-07-04 ENCOUNTER — Encounter (HOSPITAL_COMMUNITY): Payer: Self-pay

## 2012-07-04 ENCOUNTER — Encounter (HOSPITAL_COMMUNITY)
Admission: RE | Admit: 2012-07-04 | Discharge: 2012-07-04 | Disposition: A | Discharge status: Home / Self Care | Payer: Medicaid Other | Source: Ambulatory Visit | Attending: Obstetrics and Gynecology | Admitting: Obstetrics and Gynecology

## 2012-07-04 VITALS — BP 144/93 | Ht 62.0 in | Wt 185.0 lb

## 2012-07-04 DIAGNOSIS — O34219 Maternal care for unspecified type scar from previous cesarean delivery: Secondary | ICD-10-CM

## 2012-07-04 DIAGNOSIS — M545 Low back pain: Secondary | ICD-10-CM

## 2012-07-04 DIAGNOSIS — O99820 Streptococcus B carrier state complicating pregnancy: Secondary | ICD-10-CM

## 2012-07-04 LAB — CBC
Hemoglobin: 13.2 g/dL (ref 12.0–15.0)
MCV: 93.5 fL (ref 78.0–100.0)
Platelets: 197 10*3/uL (ref 150–400)
RBC: 4.14 MIL/uL (ref 3.87–5.11)
WBC: 10.8 10*3/uL — ABNORMAL HIGH (ref 4.0–10.5)

## 2012-07-04 LAB — BASIC METABOLIC PANEL WITH GFR
BUN: 9 mg/dL (ref 6–23)
CO2: 21 meq/L (ref 19–32)
Calcium: 8.6 mg/dL (ref 8.4–10.5)
Chloride: 105 meq/L (ref 96–112)
Creatinine, Ser: 0.59 mg/dL (ref 0.50–1.10)
GFR calc Af Amer: >90 mL/min
GFR calc non Af Amer: >90 mL/min
Glucose, Bld: 82 mg/dL (ref 70–99)
Potassium: 3.7 meq/L (ref 3.5–5.1)
Sodium: 134 meq/L — ABNORMAL LOW (ref 135–145)

## 2012-07-04 LAB — ABO/RH: ABO/RH(D): B NEG

## 2012-07-04 LAB — TYPE AND SCREEN
ABO/RH(D): B NEG
Antibody Screen: NEGATIVE

## 2012-07-04 NOTE — Patient Instructions (Addendum)
20 Jazsmin Couse  07/04/2012   Your procedure is scheduled on:  07/05/12  Enter through the Main Entrance of [facility] at 10 AM. Per MD instructions to patient.  Pick up the phone at the desk and dial [phone].   Call this number if you have problems the morning of surgery: [phone]   Remember:   Do not eat food:After Midnight.  Do not drink clear liquids: After Midnight.  Take these medicines the morning of surgery with A SIP OF WATER: usual dose of Insulin with dinner, 1/2 dose of bedtime.   Do not wear jewelry, make-up or nail polish.  Do not wear lotions, powders, or perfumes. You may wear deodorant.  Do not shave 48 hours prior to surgery.  Do not bring valuables to the hospital.  Contacts, dentures or bridgework may not be worn into surgery.  Leave suitcase in the car. After surgery it may be brought to your room.  For patients admitted to the hospital, checkout time is 11:00 AM the day of discharge.   Patients discharged the day of surgery will not be allowed to drive home.  Name and phone number of your driver: NA  Special Instructions: Shower using CHG 2 nights before surgery and the night before surgery.  If you shower the day of surgery use CHG.  Use special wash - you have one bottle of CHG for all showers.  You should use approximately 1/3 of the bottle for each shower.   Please read over the following fact sheets that you were given: Surgical Site Infection Prevention

## 2012-07-05 ENCOUNTER — Encounter (HOSPITAL_COMMUNITY)
Admission: RE | Disposition: A | Discharge status: Home / Self Care | Payer: Self-pay | Source: Ambulatory Visit | Attending: Obstetrics and Gynecology

## 2012-07-05 ENCOUNTER — Inpatient Hospital Stay (HOSPITAL_COMMUNITY): Payer: Medicaid Other | Admitting: Anesthesiology

## 2012-07-05 ENCOUNTER — Encounter (HOSPITAL_COMMUNITY): Payer: Self-pay | Admitting: Anesthesiology

## 2012-07-05 ENCOUNTER — Inpatient Hospital Stay (HOSPITAL_COMMUNITY)
Admission: RE | Admit: 2012-07-05 | Discharge: 2012-07-07 | DRG: 766 | Disposition: A | Discharge status: Home / Self Care | Payer: Medicaid Other | Source: Ambulatory Visit | Attending: Obstetrics and Gynecology | Admitting: Obstetrics and Gynecology

## 2012-07-05 DIAGNOSIS — M545 Low back pain: Secondary | ICD-10-CM

## 2012-07-05 DIAGNOSIS — O99820 Streptococcus B carrier state complicating pregnancy: Secondary | ICD-10-CM

## 2012-07-05 DIAGNOSIS — O99892 Other specified diseases and conditions complicating childbirth: Secondary | ICD-10-CM | POA: Diagnosis present

## 2012-07-05 DIAGNOSIS — O358XX0 Maternal care for other (suspected) fetal abnormality and damage, not applicable or unspecified: Secondary | ICD-10-CM | POA: Diagnosis present

## 2012-07-05 DIAGNOSIS — O99814 Abnormal glucose complicating childbirth: Secondary | ICD-10-CM | POA: Diagnosis present

## 2012-07-05 DIAGNOSIS — O34219 Maternal care for unspecified type scar from previous cesarean delivery: Principal | ICD-10-CM | POA: Diagnosis present

## 2012-07-05 DIAGNOSIS — O358XX1 Maternal care for other (suspected) fetal abnormality and damage, fetus 1: Secondary | ICD-10-CM

## 2012-07-05 DIAGNOSIS — Z22330 Carrier of Group B streptococcus: Secondary | ICD-10-CM

## 2012-07-05 LAB — RPR: RPR Ser Ql: NONREACTIVE

## 2012-07-05 LAB — GLUCOSE, CAPILLARY: Glucose-Capillary: 93 mg/dL (ref 70–99)

## 2012-07-05 SURGERY — Surgical Case
Anesthesia: Spinal | Site: Abdomen | Wound class: Clean Contaminated

## 2012-07-05 MED ORDER — LACTATED RINGERS IV SOLN
INTRAVENOUS | Status: DC
  Administered 2012-07-05 (×4): via INTRAVENOUS

## 2012-07-05 MED ORDER — SCOPOLAMINE 1 MG/3DAYS TD PT72
MEDICATED_PATCH | TRANSDERMAL | Status: AC
  Administered 2012-07-05: 1.5 mg via TRANSDERMAL
  Filled 2012-07-05: qty 1

## 2012-07-05 MED ORDER — FENTANYL CITRATE 0.05 MG/ML IJ SOLN
INTRAMUSCULAR | Status: AC
  Filled 2012-07-05: qty 2

## 2012-07-05 MED ORDER — DIPHENHYDRAMINE HCL 50 MG/ML IJ SOLN: 25.0000 mg | INTRAMUSCULAR | Status: DC | PRN

## 2012-07-05 MED ORDER — 0.9 % SODIUM CHLORIDE (POUR BTL) OPTIME
TOPICAL | Status: DC | PRN
  Administered 2012-07-05: 1000 mL

## 2012-07-05 MED ORDER — DIBUCAINE 1 % RE OINT: 1.0000 "application " | TOPICAL_OINTMENT | RECTAL | Status: DC | PRN

## 2012-07-05 MED ORDER — NALOXONE HCL 1 MG/ML IJ SOLN
1.0000 ug/kg/h | INTRAVENOUS | Status: DC | PRN
  Filled 2012-07-05: qty 2

## 2012-07-05 MED ORDER — IBUPROFEN 600 MG PO TABS: 600.0000 mg | ORAL_TABLET | Freq: Four times a day (QID) | ORAL | Status: DC | PRN

## 2012-07-05 MED ORDER — PHENYLEPHRINE 40 MCG/ML (10ML) SYRINGE FOR IV PUSH (FOR BLOOD PRESSURE SUPPORT)
PREFILLED_SYRINGE | INTRAVENOUS | Status: AC
  Filled 2012-07-05: qty 5

## 2012-07-05 MED ORDER — WITCH HAZEL-GLYCERIN EX PADS: 1.0000 "application " | MEDICATED_PAD | CUTANEOUS | Status: DC | PRN

## 2012-07-05 MED ORDER — NALOXONE HCL 0.4 MG/ML IJ SOLN: 0.4000 mg | INTRAMUSCULAR | Status: DC | PRN

## 2012-07-05 MED ORDER — MORPHINE SULFATE (PF) 0.5 MG/ML IJ SOLN
INTRAMUSCULAR | Status: DC | PRN
  Administered 2012-07-05: .15 mg via INTRATHECAL

## 2012-07-05 MED ORDER — SCOPOLAMINE 1 MG/3DAYS TD PT72: 1.0000 | MEDICATED_PATCH | Freq: Once | TRANSDERMAL | Status: DC

## 2012-07-05 MED ORDER — NALBUPHINE SYRINGE 5 MG/0.5 ML
INJECTION | INTRAMUSCULAR | Status: AC
  Filled 2012-07-05: qty 1

## 2012-07-05 MED ORDER — ONDANSETRON HCL 4 MG PO TABS: 4.0000 mg | ORAL_TABLET | ORAL | Status: DC | PRN

## 2012-07-05 MED ORDER — SODIUM CHLORIDE 0.9 % IJ SOLN: 3.0000 mL | INTRAMUSCULAR | Status: DC | PRN

## 2012-07-05 MED ORDER — SIMETHICONE 80 MG PO CHEW: 80.0000 mg | CHEWABLE_TABLET | ORAL | Status: DC | PRN

## 2012-07-05 MED ORDER — METFORMIN HCL 500 MG PO TABS
1000.0000 mg | ORAL_TABLET | Freq: Two times a day (BID) | ORAL | Status: DC
  Administered 2012-07-05: 1000 mg via ORAL
  Filled 2012-07-05 (×2): qty 2

## 2012-07-05 MED ORDER — KETOROLAC TROMETHAMINE 30 MG/ML IJ SOLN
30.0000 mg | Freq: Four times a day (QID) | INTRAMUSCULAR | Status: AC | PRN
  Administered 2012-07-05: 30 mg via INTRAVENOUS
  Filled 2012-07-05: qty 1

## 2012-07-05 MED ORDER — TETANUS-DIPHTH-ACELL PERTUSSIS 5-2.5-18.5 LF-MCG/0.5 IM SUSP: 0.5000 mL | Freq: Once | INTRAMUSCULAR | Status: DC

## 2012-07-05 MED ORDER — OXYTOCIN 40 UNITS IN LACTATED RINGERS INFUSION - SIMPLE MED: 62.5000 mL/h | INTRAVENOUS | Status: AC

## 2012-07-05 MED ORDER — METOCLOPRAMIDE HCL 5 MG/ML IJ SOLN: 10.0000 mg | Freq: Three times a day (TID) | INTRAMUSCULAR | Status: DC | PRN

## 2012-07-05 MED ORDER — MORPHINE SULFATE 0.5 MG/ML IJ SOLN
INTRAMUSCULAR | Status: AC
  Filled 2012-07-05: qty 10

## 2012-07-05 MED ORDER — KETOROLAC TROMETHAMINE 30 MG/ML IJ SOLN
INTRAMUSCULAR | Status: AC
  Filled 2012-07-05: qty 1

## 2012-07-05 MED ORDER — DIPHENHYDRAMINE HCL 25 MG PO CAPS
25.0000 mg | ORAL_CAPSULE | ORAL | Status: DC | PRN
  Administered 2012-07-06 (×2): 25 mg via ORAL
  Filled 2012-07-05: qty 1

## 2012-07-05 MED ORDER — OXYTOCIN 10 UNIT/ML IJ SOLN
40.0000 [IU] | INTRAVENOUS | Status: DC | PRN
  Administered 2012-07-05: 40 [IU] via INTRAVENOUS

## 2012-07-05 MED ORDER — DIPHENHYDRAMINE HCL 50 MG/ML IJ SOLN: 12.5000 mg | INTRAMUSCULAR | Status: DC | PRN

## 2012-07-05 MED ORDER — CEFAZOLIN SODIUM-DEXTROSE 2-3 GM-% IV SOLR
INTRAVENOUS | Status: DC | PRN
  Administered 2012-07-05: 2 g via INTRAVENOUS

## 2012-07-05 MED ORDER — SIMETHICONE 80 MG PO CHEW
80.0000 mg | CHEWABLE_TABLET | Freq: Three times a day (TID) | ORAL | Status: DC
  Administered 2012-07-05 – 2012-07-07 (×7): 80 mg via ORAL

## 2012-07-05 MED ORDER — IBUPROFEN 600 MG PO TABS
600.0000 mg | ORAL_TABLET | Freq: Four times a day (QID) | ORAL | Status: DC
  Administered 2012-07-06 – 2012-07-07 (×6): 600 mg via ORAL
  Filled 2012-07-05 (×6): qty 1

## 2012-07-05 MED ORDER — MENTHOL 3 MG MT LOZG: 1.0000 | LOZENGE | OROMUCOSAL | Status: DC | PRN

## 2012-07-05 MED ORDER — OXYCODONE-ACETAMINOPHEN 5-325 MG PO TABS
1.0000 | ORAL_TABLET | ORAL | Status: DC | PRN
  Administered 2012-07-06 (×2): 2 via ORAL
  Administered 2012-07-06 – 2012-07-07 (×6): 1 via ORAL
  Filled 2012-07-05: qty 2
  Filled 2012-07-05 (×2): qty 1
  Filled 2012-07-05 (×2): qty 2
  Filled 2012-07-05 (×3): qty 1

## 2012-07-05 MED ORDER — NALBUPHINE HCL 10 MG/ML IJ SOLN
5.0000 mg | INTRAMUSCULAR | Status: DC | PRN
  Administered 2012-07-05: 5 mg via INTRAVENOUS
  Administered 2012-07-05: 10 mg via INTRAVENOUS
  Filled 2012-07-05 (×2): qty 1

## 2012-07-05 MED ORDER — CEFAZOLIN SODIUM-DEXTROSE 2-3 GM-% IV SOLR: 2.0000 g | INTRAVENOUS | Status: DC

## 2012-07-05 MED ORDER — LACTATED RINGERS IV SOLN
INTRAVENOUS | Status: DC | PRN
  Administered 2012-07-05: 12:00:00 via INTRAVENOUS

## 2012-07-05 MED ORDER — BUPIVACAINE IN DEXTROSE 0.75-8.25 % IT SOLN
INTRATHECAL | Status: DC | PRN
  Administered 2012-07-05: 1.4 mL via INTRATHECAL

## 2012-07-05 MED ORDER — EPHEDRINE 5 MG/ML INJ
INTRAVENOUS | Status: AC
  Filled 2012-07-05: qty 10

## 2012-07-05 MED ORDER — NALBUPHINE HCL 10 MG/ML IJ SOLN
5.0000 mg | INTRAMUSCULAR | Status: DC | PRN
  Administered 2012-07-05: 5 mg via SUBCUTANEOUS
  Administered 2012-07-06: 10 mg via SUBCUTANEOUS
  Filled 2012-07-05: qty 0.5
  Filled 2012-07-05: qty 1

## 2012-07-05 MED ORDER — LACTATED RINGERS IV SOLN
INTRAVENOUS | Status: DC
  Administered 2012-07-05 – 2012-07-06 (×2): via INTRAVENOUS

## 2012-07-05 MED ORDER — MEPERIDINE HCL 25 MG/ML IJ SOLN: 6.2500 mg | INTRAMUSCULAR | Status: DC | PRN

## 2012-07-05 MED ORDER — ONDANSETRON HCL 4 MG/2ML IJ SOLN
INTRAMUSCULAR | Status: DC | PRN
  Administered 2012-07-05: 4 mg via INTRAVENOUS

## 2012-07-05 MED ORDER — SENNOSIDES-DOCUSATE SODIUM 8.6-50 MG PO TABS
2.0000 | ORAL_TABLET | Freq: Every day | ORAL | Status: DC
  Administered 2012-07-05 – 2012-07-06 (×2): 2 via ORAL

## 2012-07-05 MED ORDER — ONDANSETRON HCL 4 MG/2ML IJ SOLN
INTRAMUSCULAR | Status: AC
  Filled 2012-07-05: qty 2

## 2012-07-05 MED ORDER — ONDANSETRON HCL 4 MG/2ML IJ SOLN: 4.0000 mg | INTRAMUSCULAR | Status: DC | PRN

## 2012-07-05 MED ORDER — DIPHENHYDRAMINE HCL 25 MG PO CAPS
25.0000 mg | ORAL_CAPSULE | Freq: Four times a day (QID) | ORAL | Status: DC | PRN
  Filled 2012-07-05: qty 1

## 2012-07-05 MED ORDER — FENTANYL CITRATE 0.05 MG/ML IJ SOLN
INTRAMUSCULAR | Status: DC | PRN
  Administered 2012-07-05: 25 ug via INTRATHECAL

## 2012-07-05 MED ORDER — KETOROLAC TROMETHAMINE 30 MG/ML IJ SOLN
30.0000 mg | Freq: Four times a day (QID) | INTRAMUSCULAR | Status: AC | PRN
  Administered 2012-07-05: 30 mg via INTRAMUSCULAR

## 2012-07-05 MED ORDER — PRENATAL MULTIVITAMIN CH
1.0000 | ORAL_TABLET | Freq: Every day | ORAL | Status: DC
  Administered 2012-07-06 – 2012-07-07 (×2): 1 via ORAL
  Filled 2012-07-05 (×2): qty 1

## 2012-07-05 MED ORDER — LANOLIN HYDROUS EX OINT: 1.0000 "application " | TOPICAL_OINTMENT | CUTANEOUS | Status: DC | PRN

## 2012-07-05 MED ORDER — FENTANYL CITRATE 0.05 MG/ML IJ SOLN: 25.0000 ug | INTRAMUSCULAR | Status: DC | PRN

## 2012-07-05 MED ORDER — ONDANSETRON HCL 4 MG/2ML IJ SOLN: 4.0000 mg | Freq: Three times a day (TID) | INTRAMUSCULAR | Status: DC | PRN

## 2012-07-05 MED ORDER — OXYTOCIN 10 UNIT/ML IJ SOLN
INTRAMUSCULAR | Status: AC
  Filled 2012-07-05: qty 4

## 2012-07-05 MED ORDER — EPHEDRINE SULFATE 50 MG/ML IJ SOLN
INTRAMUSCULAR | Status: DC | PRN
  Administered 2012-07-05 (×3): 5 mg via INTRAVENOUS
  Administered 2012-07-05: 10 mg via INTRAVENOUS
  Administered 2012-07-05 (×8): 5 mg via INTRAVENOUS

## 2012-07-05 SURGICAL SUPPLY — 31 items
BRR ADH 6X5 SEPRAFILM 1 SHT (MISCELLANEOUS)
CONTAINER PREFILL 10% NBF 15ML (MISCELLANEOUS) IMPLANT
DRAPE LG THREE QUARTER DISP (DRAPES) ×2 IMPLANT
DRESSING TELFA 8X3 (GAUZE/BANDAGES/DRESSINGS) ×2 IMPLANT
DRSG OPSITE POSTOP 4X10 (GAUZE/BANDAGES/DRESSINGS) ×2 IMPLANT
DURAPREP 26ML APPLICATOR (WOUND CARE) ×2 IMPLANT
ELECT REM PT RETURN 9FT ADLT (ELECTROSURGICAL) ×2
ELECTRODE REM PT RTRN 9FT ADLT (ELECTROSURGICAL) ×1 IMPLANT
EXTRACTOR VACUUM M CUP 4 TUBE (SUCTIONS) IMPLANT
GLOVE BIOGEL PI IND STRL 6.5 (GLOVE) ×1 IMPLANT
GLOVE BIOGEL PI INDICATOR 6.5 (GLOVE) ×1
GLOVE SURG SS PI 6.0 STRL IVOR (GLOVE) ×2 IMPLANT
GOWN STRL REIN XL XLG (GOWN DISPOSABLE) ×4 IMPLANT
KIT ABG SYR 3ML LUER SLIP (SYRINGE) IMPLANT
NEEDLE HYPO 25X5/8 SAFETYGLIDE (NEEDLE) IMPLANT
NS IRRIG 1000ML POUR BTL (IV SOLUTION) ×2 IMPLANT
PACK C SECTION WH (CUSTOM PROCEDURE TRAY) ×2 IMPLANT
PAD ABD 7.5X8 STRL (GAUZE/BANDAGES/DRESSINGS) ×2 IMPLANT
PAD OB MATERNITY 4.3X12.25 (PERSONAL CARE ITEMS) ×2 IMPLANT
RTRCTR C-SECT PINK 25CM LRG (MISCELLANEOUS) ×1 IMPLANT
SEPRAFILM MEMBRANE 5X6 (MISCELLANEOUS) IMPLANT
SLEEVE SCD COMPRESS KNEE MED (MISCELLANEOUS) IMPLANT
SPONGE GAUZE 4X4 12PLY (GAUZE/BANDAGES/DRESSINGS) ×2 IMPLANT
STAPLER VISISTAT 35W (STAPLE) ×1 IMPLANT
SUT PLAIN 0 NONE (SUTURE) IMPLANT
SUT VIC AB 0 CT1 36 (SUTURE) ×8 IMPLANT
SUT VIC AB 4-0 KS 27 (SUTURE) ×1 IMPLANT
TAPE CLOTH SURG 4X10 WHT LF (GAUZE/BANDAGES/DRESSINGS) ×1 IMPLANT
TOWEL OR 17X24 6PK STRL BLUE (TOWEL DISPOSABLE) ×6 IMPLANT
TRAY FOLEY CATH 14FR (SET/KITS/TRAYS/PACK) ×2 IMPLANT
WATER STERILE IRR 1000ML POUR (IV SOLUTION) ×1 IMPLANT

## 2012-07-05 NOTE — Anesthesia Postprocedure Evaluation (Signed)
  Anesthesia Post-op Note  Patient: Cheryl Parker  Procedure(s) Performed: Procedure(s): CESAREAN SECTION (N/A)  Patient Location: Mother/Baby  Anesthesia Type:Spinal  Level of Consciousness: awake, alert , oriented and patient cooperative  Airway and Oxygen Therapy: Patient Spontanous Breathing  Post-op Pain: mild  Post-op Assessment: Patient's Cardiovascular Status Stable, Respiratory Function Stable, Patent Airway, No signs of Nausea or vomiting, Adequate PO intake and Pain level controlled  Post-op Vital Signs: Reviewed and stable  Complications: No apparent anesthesia complications

## 2012-07-05 NOTE — Anesthesia Procedure Notes (Signed)
Spinal  Patient location during procedure: OR Start time: 07/05/2012 11:20 AM Staffing Anesthesiologist: Keylie Beavers A. Performed by: anesthesiologist  Preanesthetic Checklist Completed: patient identified, site marked, surgical consent, pre-op evaluation, timeout performed, IV checked, risks and benefits discussed and monitors and equipment checked Spinal Block Patient position: sitting Prep: site prepped and draped and DuraPrep Patient monitoring: heart rate, cardiac monitor, continuous pulse ox and blood pressure Approach: midline Location: L3-4 Injection technique: single-shot Needle Needle type: Sprotte  Needle gauge: 24 G Needle length: 9 cm Needle insertion depth: 5 cm Assessment Sensory level: T4 Additional Notes Patient tolerated procedure well. Adequate sensory level.

## 2012-07-05 NOTE — Anesthesia Preprocedure Evaluation (Signed)
Anesthesia Evaluation  Patient identified by MRN, date of birth, ID band Patient awake    Reviewed: Allergy & Precautions, H&P , NPO status , Patient's Chart, lab work & pertinent test results  Airway Mallampati: III TM Distance: >3 FB Neck ROM: Full    Dental no notable dental hx. (+) Teeth Intact   Pulmonary neg pulmonary ROS,  breath sounds clear to auscultation  Pulmonary exam normal       Cardiovascular hypertension, negative cardio ROS  Rhythm:Regular Rate:Normal     Neuro/Psych negative neurological ROS  negative psych ROS   GI/Hepatic Neg liver ROS, GERD-  Medicated and Controlled,  Endo/Other  diabetes, Well Controlled, Gestational, Insulin Dependent  Renal/GU negative Renal ROS  negative genitourinary   Musculoskeletal negative musculoskeletal ROS (+)   Abdominal (+) + obese,   Peds  Hematology negative hematology ROS (+)   Anesthesia Other Findings   Reproductive/Obstetrics (+) Pregnancy Previous C/Section Fetal Anomalies                           Anesthesia Physical Anesthesia Plan  ASA: II  Anesthesia Plan: Spinal   Post-op Pain Management:    Induction:   Airway Management Planned: Natural Airway  Additional Equipment:   Intra-op Plan:   Post-operative Plan:   Informed Consent: I have reviewed the patients History and Physical, chart, labs and discussed the procedure including the risks, benefits and alternatives for the proposed anesthesia with the patient or authorized representative who has indicated his/her understanding and acceptance.   Dental advisory given  Plan Discussed with: Anesthesiologist, CRNA and Surgeon  Anesthesia Plan Comments:         Anesthesia Quick Evaluation

## 2012-07-05 NOTE — Anesthesia Postprocedure Evaluation (Signed)
  Anesthesia Post-op Note  Patient: Cheryl Parker  Procedure(s) Performed: Procedure(s): CESAREAN SECTION (N/A)  Patient Location: PACU  Anesthesia Type:Spinal  Level of Consciousness: awake, alert  and oriented  Airway and Oxygen Therapy: Patient Spontanous Breathing  Post-op Pain: none  Post-op Assessment: Post-op Vital signs reviewed, Patient's Cardiovascular Status Stable, Respiratory Function Stable, Patent Airway, No signs of Nausea or vomiting, Pain level controlled, No headache and No backache  Post-op Vital Signs: Reviewed and stable  Complications: No apparent anesthesia complications

## 2012-07-05 NOTE — Transfer of Care (Signed)
Immediate Anesthesia Transfer of Care Note  Patient: Cheryl Parker  Procedure(s) Performed: Procedure(s): CESAREAN SECTION (N/A)  Patient Location: PACU  Anesthesia Type:Spinal  Level of Consciousness: awake, alert  and oriented  Airway & Oxygen Therapy: Patient Spontanous Breathing  Post-op Assessment: Report given to PACU RN  Post vital signs: Reviewed  Complications: No apparent anesthesia complications

## 2012-07-05 NOTE — Addendum Note (Signed)
Addendum created 07/05/12 1322 by Tyrone Apple. Malen Gauze, MD   Modules edited: Anesthesia Attestations

## 2012-07-05 NOTE — H&P (Signed)
[name] is a 32 y.o. female G2P1001 at 20 weeks presenting for scheduled repeat cesarean section. Patient had prenatal care at [facility] hospital complicated by class B diabetes melitus, current fetus with left renal agenesis, and left cleft lip and cleft palate, h/o chronic back pain, previous cesarean section. Patient is currently without any complaints. History OB History   Grav Para Term Preterm Abortions TAB SAB Ect Mult Living   2 1 1       1      Past Medical History  Diagnosis Date  . Hypertension   . Pregnancy induced hypertension   . 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 Gestational diabetes    Past Surgical History  Procedure Laterality Date  . Cesarean section     Family History: family history includes Diabetes in her mother. Social History:  reports that she has never smoked. She has never used smokeless tobacco. She reports that she does not drink alcohol or use illicit drugs.   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      Blood pressure 137/91, pulse 91, temperature 98.2 F (36.8 C), temperature source Oral, resp. rate 18, last menstrual period 10/06/2011, SpO2 99.00%. Exam Physical Exam  GENERAL: Well-developed, well-nourished female in no acute distress.  HEENT: Normocephalic, atraumatic. Sclerae anicteric.  NECK: Supple. Normal thyroid.  LUNGS: Clear to auscultation bilaterally.  HEART: Regular rate and rhythm. ABDOMEN: Soft,  gravid, nontender. PELVIC: Not indicated EXTREMITIES: No cyanosis, clubbing, or edema, 2+ distal pulses.  Prenatal labs: ABO, Rh: --/--/B NEG, B NEG (03/11 1400) Antibody: NEG (03/11 1400) Rubella: Immune (09/16 0000) RPR: NON REACTIVE (03/11 1400)  HBsAg: Negative (09/16 0000)  HIV: Non-reactive (01/03 0000)  GBS: Positive (02/24 0000)   Assessment/Plan: 32 yo G2P1001 at 39 weeks here for scheduled repeat cesarean section - risks, benefits and alternatives were explained, including but not limited to risks of bleeding, infection and damage to adjacent organs. Patient verbalized understanding and all questions were answered.  - Patient planning on using Nexplanon vs Depo-Provera for post-partum contraception  CONSTANT,PEGGY 07/05/2012, 10:14 AM

## 2012-07-05 NOTE — Op Note (Signed)
[name] PROCEDURE DATE: 07/05/2012  PREOPERATIVE DIAGNOSIS: Intrauterine pregnancy at  [redacted]w[redacted]d weeks gestation with GDM class B (most likely pre-GDM); for scheduled elective repeat c-section  POSTOPERATIVE DIAGNOSIS: The same  PROCEDURE:     Cesarean Section  SURGEON:  Dr. Catalina Antigua  ASSISTANT: none  INDICATIONS: Cheryl Parker is a 32 y.o. [id number] at [redacted]w[redacted]d with GDM class B for scheduled for cesarean section secondary to previous cesarean section.  The risks of cesarean section discussed with the patient included but were not limited to: bleeding which may require transfusion or reoperation; infection which may require antibiotics; injury to bowel, bladder, ureters or other surrounding organs; injury to the fetus; need for additional procedures including hysterectomy in the event of a life-threatening hemorrhage; placental abnormalities wth subsequent pregnancies, incisional problems, thromboembolic phenomenon and other postoperative/anesthesia complications. The patient concurred with the proposed plan, giving informed written consent for the procedure.    FINDINGS:  Viable female infant in cephalic presentation with no evidence of cleft lip/cleft palate. Apgars 9 and 9, weight, unknown at time of witting this note.  Clear amniotic fluid.  Intact placenta, three vessel cord.  Normal uterus, fallopian tubes and ovaries bilaterally.  ANESTHESIA:    Spinal INTRAVENOUS FLUIDS:3500 ml ESTIMATED BLOOD LOSS: 700 ml URINE OUTPUT:  100 ml SPECIMENS: Placenta sent to L&D COMPLICATIONS: None immediate  PROCEDURE IN DETAIL:  The patient received intravenous antibiotics and had sequential compression devices applied to her lower extremities while in the preoperative area.  She was then taken to the operating room where anesthesia was induced and was found to be adequate. A foley catheter was placed into her bladder and attached to constant gravity. She was then placed in a dorsal supine position  with a leftward tilt, and prepped and draped in a sterile manner. After an adequate timeout was performed, a Pfannenstiel skin incision was made with scalpel and carried through to the underlying layer of fascia. The fascia was incised in the midline and this incision was extended bilaterally using the Mayo scissors. Kocher clamps were applied to the superior aspect of the fascial incision and the underlying rectus muscles were dissected off bluntly. A similar process was carried out on the inferior aspect of the facial incision. The rectus muscles were separated in the midline bluntly and the peritoneum was entered bluntly. The Alexis self-retaining retractor was introduced into the abdominal cavity. Attention was turned to the lower uterine segment where a bladder flap was created, and a transverse hysterotomy was made with a scalpel and extended bilaterally bluntly. The infant was successfully delivered, and cord was clamped and cut and infant was handed over to awaiting neonatology team. Uterine massage was then administered and the placenta delivered intact with three-vessel cord. The uterus was cleared of clot and debris.  The hysterotomy was closed with 0 Vicryl in a running locked fashion, and an imbricating layer was also placed with a 0 Vicryl. Overall, excellent hemostasis was noted. The pelvis copiously irrigated and cleared of all clot and debris. Hemostasis was confirmed on all surfaces.  The peritoneum and the muscles were reapproximated using 0 vicryl interrupted stitches. The fascia was then closed using 0 Vicryl in a running locked fashion.  The skin was closed in a subcuticular fashion using 3.0 Vicryl. The patient tolerated the procedure well. Sponge, lap, instrument and needle counts were correct x 2. She was taken to the recovery room in stable condition.    CONSTANT,PEGGYMD  07/05/2012 12:22 PM

## 2012-07-06 ENCOUNTER — Encounter (HOSPITAL_COMMUNITY): Payer: Self-pay | Admitting: *Deleted

## 2012-07-06 LAB — CBC
Hemoglobin: 11.9 g/dL — ABNORMAL LOW (ref 12.0–15.0)
MCH: 31.2 pg (ref 26.0–34.0)
Platelets: 213 10*3/uL (ref 150–400)
RBC: 3.81 MIL/uL — ABNORMAL LOW (ref 3.87–5.11)
WBC: 10.9 10*3/uL — ABNORMAL HIGH (ref 4.0–10.5)

## 2012-07-06 LAB — BIRTH TISSUE RECOVERY COLLECTION (PLACENTA DONATION)

## 2012-07-06 LAB — GLUCOSE, CAPILLARY
Glucose-Capillary: 136 mg/dL — ABNORMAL HIGH (ref 70–99)
Glucose-Capillary: 101 mg/dL — ABNORMAL HIGH (ref 70–99)

## 2012-07-06 NOTE — Progress Notes (Signed)
UR chart review completed.  

## 2012-07-06 NOTE — Progress Notes (Signed)
Subjective: Postpartum Day 1: Cesarean Delivery  Patient reports no problems voiding.  No BM or flatus. Appetite is good. No complaints.   Objective: Vital signs in last 24 hours: Temp:  [97.6 F (36.4 C)-98.4 F (36.9 C)] 98.4 F (36.9 C) (03/13 0351) Pulse Rate:  [62-92] 77 (03/13 0351) Resp:  [16-24] 20 (03/13 0351) BP: (126-171)/(72-100) 126/83 mmHg (03/13 0351) SpO2:  [93 %-100 %] 98 % (03/13 0603) Weight:  [83.915 kg (185 lb)] 83.915 kg (185 lb) (03/13 0001)  Physical Exam:  General: alert, cooperative, appears stated age and no distress Uterine Fundus: firm Incision: no significant drainage DVT Evaluation: No evidence of DVT seen on physical exam. Negative Homan's sign. No cords or calf tenderness. Heart and Lungs clear to auscultation   Recent Labs  07/04/12 1525 07/06/12 0620  HGB 13.2 11.9*  HCT 38.7 35.9*    Assessment/Plan: Status post Cesarean section. Doing well postoperatively.  Continue with current care, plan for discharge tomorrow.  Lorna Dibble, PA-S 07/06/2012, 7:38 AM

## 2012-07-07 LAB — GLUCOSE, CAPILLARY: Glucose-Capillary: 133 mg/dL — ABNORMAL HIGH (ref 70–99)

## 2012-07-07 MED ORDER — RHO D IMMUNE GLOBULIN 1500 UNIT/2ML IJ SOLN
300.0000 ug | Freq: Once | INTRAMUSCULAR | Status: AC
  Administered 2012-07-07: 300 ug via INTRAMUSCULAR
  Filled 2012-07-07: qty 2

## 2012-07-07 MED ORDER — IBUPROFEN 600 MG PO TABS: 600.0000 mg | ORAL_TABLET | Freq: Four times a day (QID) | ORAL | Status: DC

## 2012-07-07 MED ORDER — OXYCODONE-ACETAMINOPHEN 5-325 MG PO TABS: 1.0000 | ORAL_TABLET | Freq: Four times a day (QID) | ORAL | Status: DC | PRN

## 2012-07-07 MED ORDER — DOCUSATE SODIUM 100 MG PO CAPS: 100.0000 mg | ORAL_CAPSULE | Freq: Two times a day (BID) | ORAL | Status: DC

## 2012-07-07 NOTE — Progress Notes (Signed)
I have seen and examined this patient and I agree with the above. Cam Hai 10:40 PM 07/07/2012

## 2012-07-07 NOTE — Discharge Summary (Signed)
Obstetric Discharge Summary Reason for Admission: cesarean section- repeat, with GDM class B Prenatal Procedures: none Intrapartum Procedures: cesarean: low cervical, transverse Postpartum Procedures: none Complications-Operative and Postpartum: none Hemoglobin  Date Value Range Status  07/06/2012 11.9* 12.0 - 15.0 g/dL Final  1/61/0960 45.4   Final     HCT  Date Value Range Status  07/06/2012 35.9* 36.0 - 46.0 % Final    Physical Exam:  General: alert, cooperative and no distress Lochia: appropriate Uterine Fundus: firm Incision: no significant drainage, no dehiscence, no significant erythema DVT Evaluation: No evidence of DVT seen on physical exam. Negative Homan's sign. No cords or calf tenderness.  Discharge Diagnoses: Repeat Low transverse c section, Class B GDM  Discharge Information: Date: 07/07/2012 Activity: unrestricted Diet: Carb modified Medications: PNV, Ibuprofen, Colace and Percocet Condition: stable Instructions: refer to practice specific booklet Discharge to: home Follow-up Information   Follow up with [facility]. (you will be called to schedule a follow up appt for 4-6 weeks)    Contact information:   [address] [place] [address] [phone]      Newborn Data: Live born female  Birth Weight: 7 lb 2.6 oz (3250 g) APGAR: 8, 9  Home with mother.  Kevin Fenton 07/07/2012, 7:38 AM  I saw and examined patient along with student and agree with above note.   Buffey Zabinski 07/13/2012 6:54 AM

## 2012-07-09 LAB — RH IG WORKUP (INCLUDES ABO/RH)
ABO/RH(D): B NEG
Fetal Screen: NEGATIVE

## 2012-07-13 NOTE — Discharge Summary (Signed)
Chart reviewed and agree with management and plan.  

## 2012-07-27 ENCOUNTER — Encounter: Payer: Self-pay | Admitting: *Deleted

## 2012-08-02 ENCOUNTER — Ambulatory Visit: Payer: Medicaid Other | Admitting: Obstetrics & Gynecology

## 2012-08-24 ENCOUNTER — Ambulatory Visit: Payer: Medicaid Other | Admitting: Obstetrics & Gynecology

## 2012-09-15 ENCOUNTER — Ambulatory Visit: Payer: Medicaid Other | Admitting: Obstetrics & Gynecology

## 2012-10-09 ENCOUNTER — Ambulatory Visit: Payer: Medicaid Other | Admitting: Family Medicine

## 2012-11-03 ENCOUNTER — Ambulatory Visit: Payer: Medicaid Other | Admitting: Advanced Practice Midwife

## 2014-02-25 ENCOUNTER — Encounter (HOSPITAL_COMMUNITY): Payer: Self-pay | Admitting: *Deleted

## 2014-09-07 ENCOUNTER — Emergency Department (HOSPITAL_BASED_OUTPATIENT_CLINIC_OR_DEPARTMENT_OTHER): Payer: Medicaid Other

## 2014-09-07 ENCOUNTER — Emergency Department (HOSPITAL_BASED_OUTPATIENT_CLINIC_OR_DEPARTMENT_OTHER)
Admission: EM | Admit: 2014-09-07 | Discharge: 2014-09-07 | Disposition: A | Discharge status: Home / Self Care | Payer: Medicaid Other | Attending: Emergency Medicine | Admitting: Emergency Medicine

## 2014-09-07 ENCOUNTER — Encounter (HOSPITAL_BASED_OUTPATIENT_CLINIC_OR_DEPARTMENT_OTHER): Payer: Self-pay

## 2014-09-07 DIAGNOSIS — Z3202 Encounter for pregnancy test, result negative: Secondary | ICD-10-CM | POA: Insufficient documentation

## 2014-09-07 DIAGNOSIS — I10 Essential (primary) hypertension: Secondary | ICD-10-CM | POA: Diagnosis not present

## 2014-09-07 DIAGNOSIS — Z9889 Other specified postprocedural states: Secondary | ICD-10-CM | POA: Diagnosis not present

## 2014-09-07 DIAGNOSIS — Z8632 Personal history of gestational diabetes: Secondary | ICD-10-CM | POA: Diagnosis not present

## 2014-09-07 DIAGNOSIS — R1033 Periumbilical pain: Secondary | ICD-10-CM | POA: Insufficient documentation

## 2014-09-07 DIAGNOSIS — R739 Hyperglycemia, unspecified: Secondary | ICD-10-CM

## 2014-09-07 DIAGNOSIS — R1013 Epigastric pain: Secondary | ICD-10-CM

## 2014-09-07 DIAGNOSIS — E1165 Type 2 diabetes mellitus with hyperglycemia: Secondary | ICD-10-CM | POA: Diagnosis not present

## 2014-09-07 LAB — BASIC METABOLIC PANEL
Anion gap: 15 (ref 5–15)
BUN: 9 mg/dL (ref 6–20)
CO2: 19 mmol/L — AB (ref 22–32)
Calcium: 9.2 mg/dL (ref 8.9–10.3)
Chloride: 101 mmol/L (ref 101–111)
Creatinine, Ser: 0.51 mg/dL (ref 0.44–1.00)
GFR calc Af Amer: >60 mL/min (ref >60)
GLUCOSE: 307 mg/dL — AB (ref 65–99)
Potassium: 3.3 mmol/L — ABNORMAL LOW (ref 3.5–5.1)
SODIUM: 135 mmol/L (ref 135–145)

## 2014-09-07 LAB — PREGNANCY, URINE: Preg Test, Ur: NEGATIVE

## 2014-09-07 LAB — CBC WITH DIFFERENTIAL/PLATELET
BASOS PCT: 0 % (ref 0–1)
Basophils Absolute: 0 10*3/uL (ref 0.0–0.1)
EOS PCT: 0 % (ref 0–5)
Eosinophils Absolute: 0 10*3/uL (ref 0.0–0.7)
HCT: 44.6 % (ref 36.0–46.0)
Hemoglobin: 15.4 g/dL — ABNORMAL HIGH (ref 12.0–15.0)
LYMPHS ABS: 1.9 10*3/uL (ref 0.7–4.0)
Lymphocytes Relative: 13 % (ref 12–46)
MCH: 31.9 pg (ref 26.0–34.0)
MCHC: 34.5 g/dL (ref 30.0–36.0)
MCV: 92.3 fL (ref 78.0–100.0)
MONO ABS: 0.5 10*3/uL (ref 0.1–1.0)
Monocytes Relative: 3 % (ref 3–12)
NEUTROS PCT: 84 % — AB (ref 43–77)
Neutro Abs: 12.2 10*3/uL — ABNORMAL HIGH (ref 1.7–7.7)
PLATELETS: 400 10*3/uL (ref 150–400)
RBC: 4.83 MIL/uL (ref 3.87–5.11)
RDW: 11.4 % — AB (ref 11.5–15.5)
WBC: 14.6 10*3/uL — AB (ref 4.0–10.5)

## 2014-09-07 LAB — URINALYSIS, ROUTINE W REFLEX MICROSCOPIC
BILIRUBIN URINE: NEGATIVE
HGB URINE DIPSTICK: NEGATIVE
Leukocytes, UA: NEGATIVE
Nitrite: NEGATIVE
PROTEIN: NEGATIVE mg/dL
SPECIFIC GRAVITY, URINE: 1.042 — AB (ref 1.005–1.030)
UROBILINOGEN UA: 0.2 mg/dL (ref 0.0–1.0)
pH: 6 (ref 5.0–8.0)

## 2014-09-07 LAB — URINE MICROSCOPIC-ADD ON

## 2014-09-07 LAB — LIPASE, BLOOD: Lipase: 30 U/L (ref 22–51)

## 2014-09-07 LAB — I-STAT CG4 LACTIC ACID, ED: Lactic Acid, Venous: 2.33 mmol/L (ref 0.5–2.0)

## 2014-09-07 MED ORDER — TRAMADOL HCL 50 MG PO TABS: 50.0000 mg | ORAL_TABLET | Freq: Four times a day (QID) | ORAL | Status: DC | PRN

## 2014-09-07 MED ORDER — IOHEXOL 300 MG/ML  SOLN
100.0000 mL | Freq: Once | INTRAMUSCULAR | Status: AC | PRN
  Administered 2014-09-07: 100 mL via INTRAVENOUS

## 2014-09-07 MED ORDER — ONDANSETRON HCL 4 MG/2ML IJ SOLN
4.0000 mg | Freq: Once | INTRAMUSCULAR | Status: AC
  Administered 2014-09-07: 4 mg via INTRAVENOUS

## 2014-09-07 MED ORDER — IOHEXOL 300 MG/ML  SOLN
25.0000 mL | Freq: Once | INTRAMUSCULAR | Status: AC | PRN
  Administered 2014-09-07: 25 mL via ORAL

## 2014-09-07 MED ORDER — METFORMIN HCL 500 MG PO TABS: 500.0000 mg | ORAL_TABLET | Freq: Two times a day (BID) | ORAL | Status: DC

## 2014-09-07 MED ORDER — SODIUM CHLORIDE 0.9 % IV BOLUS (SEPSIS)
2000.0000 mL | Freq: Once | INTRAVENOUS | Status: AC
  Administered 2014-09-07 (×2): 1000 mL via INTRAVENOUS

## 2014-09-07 MED ORDER — HYDROMORPHONE HCL 1 MG/ML IJ SOLN
1.0000 mg | Freq: Once | INTRAMUSCULAR | Status: AC
  Administered 2014-09-07: 1 mg via INTRAVENOUS
  Filled 2014-09-07: qty 1

## 2014-09-07 MED ORDER — ESOMEPRAZOLE MAGNESIUM 40 MG PO CPDR: 40.0000 mg | DELAYED_RELEASE_CAPSULE | Freq: Every day | ORAL | Status: DC

## 2014-09-07 MED ORDER — ONDANSETRON HCL 4 MG/2ML IJ SOLN
INTRAMUSCULAR | Status: AC
  Filled 2014-09-07: qty 2

## 2014-09-07 NOTE — ED Notes (Addendum)
Pt noted to be hyperventilating and forcefully retching into trashcan when rn entered room, calms easily and no emesis noted.  Only small amounts of spit noted in trashcan.  Pt requesting pain meds, informed will have to see md for narcotic administration orders.  Nausea meds offered, pt states "need pain meds".

## 2014-09-07 NOTE — ED Notes (Signed)
Pt given [facility] "Living well with Diabetes" booklet. Pt verbalized understanding of s/s of hypoglycemia and when to check CBGs. States she will take glucometer to pharmacy when she gets medications filled in order to make sure she had the correct blood glucose strips, batteries etc. Danna HeftyGolden, Darshay Deupree Lee, RN

## 2014-09-07 NOTE — ED Notes (Signed)
Critical Value Lactic Acid 2.33 reported to Claria Dicehristy Golden, Charity fundraiserN and M. Micheline Mazeocherty, MD

## 2014-09-07 NOTE — ED Provider Notes (Signed)
CSN: [id number]     Arrival date & time 09/07/14  1140 History   First MD Initiated Contact with Patient 09/07/14 1320     Chief Complaint  Patient presents with  . Abdominal Pain     (Consider location/radiation/quality/duration/timing/severity/associated sxs/prior Treatment) Patient is a 34 y.o. female presenting with abdominal pain. The history is provided by the patient. No language interpreter was used.  Abdominal Pain Pain location:  Periumbilical and epigastric Pain quality: aching and sharp   Pain radiates to:  Does not radiate Pain severity:  Severe Onset quality:  Sudden Timing:  Constant Progression:  Unchanged Chronicity:  New Context comment:  After drinking a 5 hours energy drink Relieved by:  Nothing Worsened by:  Nothing tried Ineffective treatments: tylenol. Associated symptoms: anorexia, chills, nausea and vomiting   Associated symptoms: no chest pain, no cough, no diarrhea, no dysuria, no fatigue, no fever, no shortness of breath and no sore throat     Past Medical History  Diagnosis Date  . Hypertension   . Pregnancy induced hypertension   . 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 Gestational diabetes    Past Surgical History  Procedure Laterality Date  . Cesarean section    . Cesarean section N/A 07/05/2012    Procedure: CESAREAN SECTION;  Surgeon: Catalina Antigua, MD;  Location: [facility];  Service: Obstetrics;  Laterality: N/A;   Family History  Problem Relation Age of Onset  . Diabetes Mother    History  Substance Use Topics  . Smoking status: Never Smoker   . Smokeless tobacco: Never Used  . Alcohol Use: No   OB History    Gravida Para Term Preterm AB TAB SAB Ectopic Multiple Living   Review of Systems  Constitutional: Positive for chills. Negative for fever, diaphoresis, activity change, appetite change and fatigue.  HENT: Negative for congestion, facial swelling, rhinorrhea and sore throat.    Eyes: Negative for photophobia and discharge.  Respiratory: Negative for cough, chest tightness and shortness of breath.   Cardiovascular: Negative for chest pain, palpitations and leg swelling.  Gastrointestinal: Positive for nausea, vomiting, abdominal pain and anorexia. Negative for diarrhea.  Endocrine: Negative for polydipsia and polyuria.  Genitourinary: Negative for dysuria, frequency, difficulty urinating and pelvic pain.  Musculoskeletal: Negative for back pain, arthralgias, neck pain and neck stiffness.  Skin: Negative for color change and wound.  Allergic/Immunologic: Negative for immunocompromised state.  Neurological: Negative for facial asymmetry, weakness, numbness and headaches.  Hematological: Does not bruise/bleed easily.  Psychiatric/Behavioral: Negative for confusion and agitation.      Allergies  Review of patient's allergies indicates no known allergies.  Home Medications   Prior to Admission medications   Not on File   BP 143/94 mmHg  Pulse 92  Temp(Src) 98.2 F (36.8 C) (Oral)  Resp 18  Ht  (1.651 m)  Wt 145 lb (65.772 kg)  BMI 24.13 kg/m2  SpO2 100%  LMP 06/25/2014 Physical Exam  Constitutional: She is oriented to person, place, and time. She appears well-developed and well-nourished. No distress.  HENT:  Head: Normocephalic and atraumatic.  Mouth/Throat: No oropharyngeal exudate.  Eyes: Pupils are equal, round, and reactive to light.  Neck: Normal range of motion. Neck supple.  Cardiovascular: Normal rate, regular rhythm and normal heart sounds.  Exam reveals no gallop and no friction rub.   No murmur heard. Pulmonary/Chest: Effort normal and  breath sounds normal. No respiratory distress. She has no wheezes. She has no rales.  Abdominal: Soft. Bowel sounds are normal. She exhibits no distension and no mass. There is tenderness in the epigastric area and periumbilical area. There is no rigidity, no rebound and no guarding.  Musculoskeletal:  Normal range of motion. She exhibits no edema or tenderness.  Neurological: She is alert and oriented to person, place, and time.  Skin: Skin is warm and dry.  Psychiatric: She has a normal mood and affect.    ED Course  Procedures (including critical care time) Labs Review Labs Reviewed  URINALYSIS, ROUTINE W REFLEX MICROSCOPIC - Abnormal; Notable for the following:    Specific Gravity, Urine 1.042 (*)    Glucose, UA >1000 (*)    Ketones, ur >80 (*)    All other components within normal limits  URINE MICROSCOPIC-ADD ON - Abnormal; Notable for the following:    Bacteria, UA FEW (*)    All other components within normal limits  CBC WITH DIFFERENTIAL/PLATELET - Abnormal; Notable for the following:    WBC 14.6 (*)    Hemoglobin 15.4 (*)    RDW 11.4 (*)    Neutrophils Relative % 84 (*)    Neutro Abs 12.2 (*)    All other components within normal limits  BASIC METABOLIC PANEL - Abnormal; Notable for the following:    Potassium 3.3 (*)    CO2 19 (*)    Glucose, Bld 307 (*)    All other components within normal limits  I-STAT CG4 LACTIC ACID, ED - Abnormal; Notable for the following:    Lactic Acid, Venous 2.33 (*)    All other components within normal limits  PREGNANCY, URINE  LIPASE, BLOOD    Imaging Review [place] Abdomen Complete  09/07/2014   CLINICAL DATA:  Mid epigastric pain for 1 day. Nausea and vomiting for 1 day. Chills and back pain.  EXAM: ULTRASOUND ABDOMEN COMPLETE  COMPARISON:  09/25/2009  FINDINGS: Gallbladder: No gallstones or wall thickening visualized. No sonographic Murphy sign noted.  Common bile duct: Diameter: 2 mm  Liver: Increased echogenicity throughout the liver without focal mass.  IVC: No abnormality visualized.  Pancreas: Partially obscured.  No obvious mass.  Spleen: Size and appearance within normal limits.  Right Kidney: Length: 13.4 cm. Echogenicity within normal limits. No mass or hydronephrosis visualized.  Left Kidney: Length: 12.2 cm. Echogenicity  within normal limits. No mass or hydronephrosis visualized.  Abdominal aorta: Abdominal aorta was partially obscured. Maximal visualized caliber is 2.3 cm.  Other findings: None.  IMPRESSION: Limited visualization of the aorta and pancreas.  Increased echogenicity throughout the liver likely due to diffuse hepatic steatosis.   Electronically Signed   By: Jolaine ClickArthur  Hoss M.D.   On: 09/07/2014 15:02     EKG Interpretation None      MDM   Final diagnoses:  Epigastric abdominal pain    Pt is a 34 y.o. female with Pmhx as above who presents with epigastic/periumbilical ab pain since about 6 PM last night several hours after drinking a 5 hour energy drink.  She's had associated nausea, vomiting and dry heaves, no diarrhea, no fevers, though has had chills.  On physical exam she is mildly tachycardic.  She appears uncomfortable but in no acute distress.  Abdomen is soft with tenderness in the periumbilical and epigastric region.  Labs from triage shows a leukocytosis of 14.6, hemoglobin of 15.5, which is likely due to hemoconcentration with associated vomiting.  BMP shows  potassium of 3.3, normal creatinine.  Glucose is elevated at 307, anion gap is normal.  Urine does not appear infected.    lactate is elevated, ultrasound was unremarkable.  We'll plan on CT abdomen pelvis which we followed by Dr. Silverio LayYao.    Toy CookeyMegan Pleasant Bensinger, MD 09/07/14 1535

## 2014-09-07 NOTE — ED Provider Notes (Signed)
  Physical Exam  BP 143/94 mmHg  Pulse 92  Temp(Src) 98.2 F (36.8 C) (Oral)  Resp 18  Ht 5\' 5"  (1.651 m)  Wt 145 lb (65.772 kg)  BMI 24.13 kg/m2  SpO2 100%  LMP 06/25/2014  Physical Exam  ED Course  Procedures  MDM Care assumed at sign out from Dr. Micheline Mazeocherty. Patient here with ab pain s/p drinking energy drink. Labs showed glucose 300 with mild gap and mildly elevated lactate. Sign out pending CT ab/pel. CT unremarkable. Felt better, tolerated PO fluids. I think likely early gastro vs food poisoning. She has hx of gestational diabetes and is at risk for diabetes. Counseled patient regarding starting low dose metformin vs repeat labs and Hg A1c with PMD next week and she wants to start medicines for now. Will dc with metformin 500 mg BID. Also start nexium and prn tramadol.   Richardean Canalavid H Yao, MD 09/07/14 [phone]

## 2014-09-07 NOTE — Discharge Instructions (Signed)
Take metformin 500 mg twice daily as your sugar is high. You need to see your doctor this week to get further workup for diabetes.   Take nexium as it will help your stomach.   Take tramadol as needed for pain. DO NOT drive with it.   See your doctor to get blood work done.   Return to ER if you have severe pain, vomiting, fever.  Avoid energy drinks.

## 2014-09-07 NOTE — ED Notes (Signed)
Patient here with generalized abdominal pain x 1 day with vomiting. Patient reports that she thinks related to drinking 5 hour energy drink yesterday.

## 2014-09-07 NOTE — ED Notes (Signed)
MD at bedside. 

## 2014-09-10 ENCOUNTER — Encounter (HOSPITAL_COMMUNITY): Payer: Self-pay

## 2014-09-10 ENCOUNTER — Inpatient Hospital Stay (HOSPITAL_COMMUNITY)
Admission: EM | Admit: 2014-09-10 | Discharge: 2014-09-12 | DRG: 638 | Disposition: A | Discharge status: Home / Self Care | Payer: Medicaid Other | Attending: Family Medicine | Admitting: Family Medicine

## 2014-09-10 DIAGNOSIS — E1110 Type 2 diabetes mellitus with ketoacidosis without coma: Secondary | ICD-10-CM | POA: Diagnosis present

## 2014-09-10 DIAGNOSIS — E1165 Type 2 diabetes mellitus with hyperglycemia: Secondary | ICD-10-CM | POA: Insufficient documentation

## 2014-09-10 DIAGNOSIS — R079 Chest pain, unspecified: Secondary | ICD-10-CM | POA: Insufficient documentation

## 2014-09-10 DIAGNOSIS — E860 Dehydration: Secondary | ICD-10-CM | POA: Diagnosis present

## 2014-09-10 DIAGNOSIS — I248 Other forms of acute ischemic heart disease: Secondary | ICD-10-CM | POA: Diagnosis present

## 2014-09-10 DIAGNOSIS — R1013 Epigastric pain: Secondary | ICD-10-CM | POA: Insufficient documentation

## 2014-09-10 DIAGNOSIS — E1310 Other specified diabetes mellitus with ketoacidosis without coma: Secondary | ICD-10-CM | POA: Diagnosis present

## 2014-09-10 DIAGNOSIS — Z833 Family history of diabetes mellitus: Secondary | ICD-10-CM | POA: Diagnosis not present

## 2014-09-10 DIAGNOSIS — Z7982 Long term (current) use of aspirin: Secondary | ICD-10-CM | POA: Diagnosis not present

## 2014-09-10 DIAGNOSIS — I10 Essential (primary) hypertension: Secondary | ICD-10-CM | POA: Diagnosis present

## 2014-09-10 DIAGNOSIS — K5900 Constipation, unspecified: Secondary | ICD-10-CM | POA: Diagnosis present

## 2014-09-10 DIAGNOSIS — Z794 Long term (current) use of insulin: Secondary | ICD-10-CM

## 2014-09-10 DIAGNOSIS — Z79899 Other long term (current) drug therapy: Secondary | ICD-10-CM

## 2014-09-10 DIAGNOSIS — E785 Hyperlipidemia, unspecified: Secondary | ICD-10-CM | POA: Insufficient documentation

## 2014-09-10 HISTORY — DX: Type 2 diabetes mellitus with ketoacidosis without coma: E11.10

## 2014-09-10 LAB — URINE MICROSCOPIC-ADD ON

## 2014-09-10 LAB — LIPID PANEL
CHOL/HDL RATIO: 7.6 ratio
CHOLESTEROL: 212 mg/dL — AB (ref 0–200)
HDL: 28 mg/dL — ABNORMAL LOW (ref >40)
LDL Cholesterol: 159 mg/dL — ABNORMAL HIGH (ref 0–99)
Triglycerides: 126 mg/dL (ref <150)
VLDL: 25 mg/dL (ref 0–40)

## 2014-09-10 LAB — CBC WITH DIFFERENTIAL/PLATELET
Basophils Absolute: 0 10*3/uL (ref 0.0–0.1)
Basophils Relative: 0 % (ref 0–1)
Eosinophils Absolute: 0 10*3/uL (ref 0.0–0.7)
Eosinophils Relative: 0 % (ref 0–5)
HCT: 47.4 % — ABNORMAL HIGH (ref 36.0–46.0)
Hemoglobin: 16.3 g/dL — ABNORMAL HIGH (ref 12.0–15.0)
LYMPHS ABS: 1.8 10*3/uL (ref 0.7–4.0)
LYMPHS PCT: 13 % (ref 12–46)
MCH: 31.8 pg (ref 26.0–34.0)
MCHC: 34.4 g/dL (ref 30.0–36.0)
MCV: 92.4 fL (ref 78.0–100.0)
MONOS PCT: 6 % (ref 3–12)
Monocytes Absolute: 0.8 10*3/uL (ref 0.1–1.0)
NEUTROS PCT: 81 % — AB (ref 43–77)
Neutro Abs: 11.5 10*3/uL — ABNORMAL HIGH (ref 1.7–7.7)
PLATELETS: 409 10*3/uL — AB (ref 150–400)
RBC: 5.13 MIL/uL — AB (ref 3.87–5.11)
RDW: 12.2 % (ref 11.5–15.5)
WBC: 14.2 10*3/uL — ABNORMAL HIGH (ref 4.0–10.5)

## 2014-09-10 LAB — URINALYSIS, ROUTINE W REFLEX MICROSCOPIC
Bilirubin Urine: NEGATIVE
Ketones, ur: >80 mg/dL — AB
Leukocytes, UA: NEGATIVE
Nitrite: NEGATIVE
PH: 5.5 (ref 5.0–8.0)
Protein, ur: NEGATIVE mg/dL
SPECIFIC GRAVITY, URINE: 1.044 — AB (ref 1.005–1.030)
UROBILINOGEN UA: 0.2 mg/dL (ref 0.0–1.0)

## 2014-09-10 LAB — LIPASE, BLOOD: Lipase: 28 U/L (ref 22–51)

## 2014-09-10 LAB — BASIC METABOLIC PANEL
ANION GAP: 12 (ref 5–15)
BUN: 9 mg/dL (ref 6–20)
CHLORIDE: 105 mmol/L (ref 101–111)
CO2: 18 mmol/L — AB (ref 22–32)
Calcium: 8.7 mg/dL — ABNORMAL LOW (ref 8.9–10.3)
Creatinine, Ser: 0.69 mg/dL (ref 0.44–1.00)
GFR calc non Af Amer: >60 mL/min (ref >60)
Glucose, Bld: 263 mg/dL — ABNORMAL HIGH (ref 65–99)
POTASSIUM: 3.9 mmol/L (ref 3.5–5.1)
SODIUM: 135 mmol/L (ref 135–145)

## 2014-09-10 LAB — COMPREHENSIVE METABOLIC PANEL
ALBUMIN: 4.6 g/dL (ref 3.5–5.0)
ALT: 31 U/L (ref 14–54)
AST: 12 U/L — ABNORMAL LOW (ref 15–41)
Alkaline Phosphatase: 106 U/L (ref 38–126)
Anion gap: 16 — ABNORMAL HIGH (ref 5–15)
BUN: 10 mg/dL (ref 6–20)
CO2: 16 mmol/L — ABNORMAL LOW (ref 22–32)
Calcium: 9.8 mg/dL (ref 8.9–10.3)
Chloride: 99 mmol/L — ABNORMAL LOW (ref 101–111)
Creatinine, Ser: 0.91 mg/dL (ref 0.44–1.00)
GFR calc non Af Amer: >60 mL/min (ref >60)
Glucose, Bld: 428 mg/dL — ABNORMAL HIGH (ref 65–99)
POTASSIUM: 4.3 mmol/L (ref 3.5–5.1)
Sodium: 131 mmol/L — ABNORMAL LOW (ref 135–145)
TOTAL PROTEIN: 7.8 g/dL (ref 6.5–8.1)
Total Bilirubin: 1.5 mg/dL — ABNORMAL HIGH (ref 0.3–1.2)

## 2014-09-10 LAB — TROPONIN I
Troponin I: 0.06 ng/mL — ABNORMAL HIGH (ref <0.031)
Troponin I: <0.03 ng/mL (ref <0.031)

## 2014-09-10 LAB — PREGNANCY, URINE: Preg Test, Ur: NEGATIVE

## 2014-09-10 LAB — RAPID URINE DRUG SCREEN, HOSP PERFORMED
Amphetamines: NOT DETECTED
Barbiturates: POSITIVE — AB
Benzodiazepines: NOT DETECTED
Cocaine: NOT DETECTED
Opiates: NOT DETECTED
Tetrahydrocannabinol: POSITIVE — AB

## 2014-09-10 LAB — CBG MONITORING, ED
GLUCOSE-CAPILLARY: 287 mg/dL — AB (ref 65–99)
Glucose-Capillary: 433 mg/dL — ABNORMAL HIGH (ref 65–99)

## 2014-09-10 LAB — GLUCOSE, CAPILLARY
GLUCOSE-CAPILLARY: 253 mg/dL — AB (ref 65–99)
Glucose-Capillary: 279 mg/dL — ABNORMAL HIGH (ref 65–99)

## 2014-09-10 LAB — TSH: TSH: 0.718 u[IU]/mL (ref 0.350–4.500)

## 2014-09-10 MED ORDER — HYDROMORPHONE HCL 1 MG/ML IJ SOLN
1.0000 mg | Freq: Once | INTRAMUSCULAR | Status: AC
  Administered 2014-09-10: 1 mg via INTRAVENOUS
  Filled 2014-09-10: qty 1

## 2014-09-10 MED ORDER — POTASSIUM CHLORIDE CRYS ER 20 MEQ PO TBCR
40.0000 meq | EXTENDED_RELEASE_TABLET | Freq: Two times a day (BID) | ORAL | Status: DC
  Administered 2014-09-10: 40 meq via ORAL
  Filled 2014-09-10 (×3): qty 2

## 2014-09-10 MED ORDER — INSULIN GLARGINE 100 UNIT/ML ~~LOC~~ SOLN
10.0000 [IU] | Freq: Once | SUBCUTANEOUS | Status: AC
  Administered 2014-09-10: 10 [IU] via SUBCUTANEOUS
  Filled 2014-09-10: qty 0.1

## 2014-09-10 MED ORDER — ONDANSETRON HCL 4 MG/2ML IJ SOLN
4.0000 mg | Freq: Once | INTRAMUSCULAR | Status: AC
  Administered 2014-09-10: 4 mg via INTRAVENOUS
  Filled 2014-09-10: qty 2

## 2014-09-10 MED ORDER — ENSURE ENLIVE PO LIQD: 237.0000 mL | Freq: Two times a day (BID) | ORAL | Status: DC

## 2014-09-10 MED ORDER — PNEUMOCOCCAL VAC POLYVALENT 25 MCG/0.5ML IJ INJ
0.5000 mL | INJECTION | INTRAMUSCULAR | Status: AC
  Administered 2014-09-11: 0.5 mL via INTRAMUSCULAR
  Filled 2014-09-10: qty 0.5

## 2014-09-10 MED ORDER — GI COCKTAIL ~~LOC~~
30.0000 mL | Freq: Once | ORAL | Status: AC
  Administered 2014-09-10: 30 mL via ORAL
  Filled 2014-09-10: qty 30

## 2014-09-10 MED ORDER — SODIUM CHLORIDE 0.9 % IV BOLUS (SEPSIS)
1000.0000 mL | INTRAVENOUS | Status: AC
  Administered 2014-09-10: 1000 mL via INTRAVENOUS

## 2014-09-10 MED ORDER — DEXTROSE-NACL 5-0.45 % IV SOLN: INTRAVENOUS | Status: DC

## 2014-09-10 MED ORDER — HEPARIN SODIUM (PORCINE) 5000 UNIT/ML IJ SOLN
5000.0000 [IU] | Freq: Three times a day (TID) | INTRAMUSCULAR | Status: DC
  Administered 2014-09-11: 5000 [IU] via SUBCUTANEOUS
  Filled 2014-09-10 (×7): qty 1

## 2014-09-10 MED ORDER — INSULIN ASPART 100 UNIT/ML ~~LOC~~ SOLN
0.0000 [IU] | SUBCUTANEOUS | Status: DC
  Administered 2014-09-10 (×2): 5 [IU] via SUBCUTANEOUS
  Administered 2014-09-11 (×2): 3 [IU] via SUBCUTANEOUS
  Administered 2014-09-11: 1 [IU] via SUBCUTANEOUS

## 2014-09-10 MED ORDER — ONDANSETRON HCL 4 MG/2ML IJ SOLN: 4.0000 mg | Freq: Four times a day (QID) | INTRAMUSCULAR | Status: DC | PRN

## 2014-09-10 MED ORDER — DEXTROSE-NACL 5-0.45 % IV SOLN
INTRAVENOUS | Status: DC
  Administered 2014-09-11: via INTRAVENOUS

## 2014-09-10 MED ORDER — POTASSIUM CHLORIDE 10 MEQ/100ML IV SOLN
10.0000 meq | INTRAVENOUS | Status: AC
  Administered 2014-09-10 (×2): 10 meq via INTRAVENOUS
  Filled 2014-09-10 (×2): qty 100

## 2014-09-10 MED ORDER — HYDROMORPHONE HCL 1 MG/ML IJ SOLN
0.5000 mg | Freq: Once | INTRAMUSCULAR | Status: AC
  Administered 2014-09-10: 0.5 mg via INTRAVENOUS
  Filled 2014-09-10: qty 1

## 2014-09-10 MED ORDER — MORPHINE SULFATE 2 MG/ML IJ SOLN
1.0000 mg | INTRAMUSCULAR | Status: DC | PRN
  Filled 2014-09-10: qty 1

## 2014-09-10 MED ORDER — ACETAMINOPHEN 325 MG PO TABS
650.0000 mg | ORAL_TABLET | Freq: Four times a day (QID) | ORAL | Status: DC | PRN
  Administered 2014-09-10 – 2014-09-11 (×4): 650 mg via ORAL
  Filled 2014-09-10 (×4): qty 2

## 2014-09-10 MED ORDER — SODIUM CHLORIDE 0.9 % IV SOLN
INTRAVENOUS | Status: DC
  Administered 2014-09-10: 1000 mL via INTRAVENOUS

## 2014-09-10 MED ORDER — SODIUM CHLORIDE 0.9 % IV SOLN
INTRAVENOUS | Status: DC
  Filled 2014-09-10: qty 2.5

## 2014-09-10 MED ORDER — MORPHINE SULFATE 2 MG/ML IJ SOLN
1.0000 mg | INTRAMUSCULAR | Status: DC | PRN
Start: 2014-09-10 — End: 2014-09-12
  Administered 2014-09-10: 1 mg via INTRAVENOUS

## 2014-09-10 NOTE — Progress Notes (Signed)
NURSING PROGRESS NOTE  Cheryl Parker [id number] Admission Data: 09/10/2014 3:12 PM Attending Provider: Doreene ElandKehinde T Eniola, MD PCP:No primary care provider on file. Code Status: full  Cheryl Parker is a 34 y.o. female patient admitted from ED:  -No acute distress noted.  -No complaints of shortness of breath.  -No complaints of chest pain.   Cardiac Monitoring: Box # [id number] in place. Cardiac monitor yields:normal sinus rhythm.  Blood pressure 144/69, pulse 102, temperature 98.3 F (36.8 C), temperature source Oral, resp. rate 20, height 5\' 2"  (1.575 m), weight 65.772 kg (145 lb), last menstrual period 06/25/2014, SpO2 100 %, unknown if currently breastfeeding.   IV Fluids:  IV in place, occlusive dsg intact without redness, IV cath antecubital right, condition patent and no redness normal saline bolus.   Allergies:  Review of patient's allergies indicates no known allergies.  Past Medical History:   has a past medical history of Hypertension; Pregnancy induced hypertension; Diabetes mellitus without complication; and Gestational diabetes.  Past Surgical History:   has past surgical history that includes Cesarean section and Cesarean section (N/A, 07/05/2012).  Social History:   reports that she has never smoked. She has never used smokeless tobacco. She reports that she does not drink alcohol or use illicit drugs.  Skin: dry intact  Patient/Family orientated to room. Information packet given to patient/family. Admission inpatient armband information verified with patient/family to include name and date of birth and placed on patient arm. Side rails up x 2, fall assessment and education completed with patient/family. Patient/family able to verbalize understanding of risk associated with falls and verbalized understanding to call for assistance before getting out of bed. Call light within reach. Patient/family able to voice and demonstrate understanding of unit orientation instructions.     Will continue to evaluate and treat per MD orders.

## 2014-09-10 NOTE — H&P (Signed)
[facility] Admission History and Physical Service Pager: [phone]  Patient name: Cheryl Parker Medical record number: [id number] Date of birth: [birth year] Age: 34 y.o. Gender: female  Primary Care Provider: No primary care provider on file. Consultants: none Code Status: full  Chief Complaint: Abdominal pain  Assessment and Plan: Shaunie Boehm is a 34 y.o. female presenting with mild DKA and epigastric abdominal pain. PMH is significant for gestational diabetes.   # DKA: Mild. Admit (Bicarb 16; AG 16; cbg 433) DKA likely due to new onset diabetes; No current signs of infection or MI.  History of gestational diabetes; no PCP f/u in last 3 years.  - IV fluids as below; s/p 2L bolus in ED - Lantus 10u; SSI q4hrs - BMET q6hrs x 3 to monitor & replace potassium as needed - check TSH, A1c, UDS, Lipids - Zofran prn - Consult care management for assistance with establishing PCP   # Epigastric abdominal pain: Likely due to DKA.  Upreg Negative.  CT abdomen 09/07/14 Negative. Lipase wnl - Continue to monitor   # Chest Pain: Atypical: worse with movement and palpation.  - check EKG; cycle trops  FEN/GI: NS @ 125 after completing ED bolus - Switch to D5 when cbgs < 250; Carb Mod diet Prophylaxis: Subcutaneous heparin  Disposition: Admit to MedSurg; discharge home pending resolution of DKA  History of Present Illness: Senovia Parker is a 34 y.o. female presenting with epigastric abdominal pain associated with anorexia.  She reports epigastric pain for the past week associated with nausea, vomiting and decreased appetite.  She reports being evaluated in urgent care 2 days ago was diagnosed with diabetes.  She was prescribed metformin which she has filled; however, due to her pain and nausea she has been unable to eat. Abdominal pain is sharp and nonradiating.  She has history of C-section 2, but no other abdominal surgeries.  Denies dyspepsia, reflux or right  upper quadrant pain.  Denies hematemesis.  Denies fevers, chills, URI symptoms, cough, or dysuria.  Endorses sharp left sided chest pain today while in the ED that is worse with movement.  Denies any dyspnea.  History of gestational diabetes.  No PCP follow-up and last 3 years.   Review Of Systems: Per HPI with the following additions:  Otherwise 12 point review of systems was performed and was unremarkable.  Patient Active Problem List   Diagnosis Date Noted  . DKA (diabetic ketoacidoses) 09/10/2014  . Diabetic ketoacidosis without coma associated with other specified diabetes mellitus   . Epigastric pain    Past Medical History: Past Medical History  Diagnosis Date  . Hypertension   . Pregnancy induced hypertension   . 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 Gestational diabetes   . DKA (diabetic ketoacidoses) 09/10/2014   Past Surgical History: Past Surgical History  Procedure Laterality Date  . Cesarean section    . Cesarean section N/A 07/05/2012    Procedure: CESAREAN SECTION;  Surgeon: Mora Bellman, MD;  Location: [facility];  Service: Obstetrics;  Laterality: N/A;   Social History: History  Substance Use Topics  . Smoking status: Never Smoker   . Smokeless tobacco: Never Used  . Alcohol Use: No   Additional social history:   Please also refer to relevant sections of EMR.  Family History: Family History  Problem Relation Age of Onset  . Diabetes Mother    Allergies and Medications: No Known Allergies No current facility-administered medications on file prior  to encounter.   Current Outpatient Prescriptions on File Prior to Encounter  Medication Sig Dispense Refill  . esomeprazole (NEXIUM) 40 MG capsule Take 1 capsule (40 mg total) by mouth daily. 30 capsule 0  . metFORMIN (GLUCOPHAGE) 500 MG tablet Take 1 tablet (500 mg total) by mouth 2 (two) times daily with a meal. 30 tablet 0  . traMADol (ULTRAM) 50 MG tablet Take 1 tablet (50 mg  total) by mouth every 6 (six) hours as needed. 8 tablet 0    Objective: BP 120/86 mmHg  Pulse 92  Temp(Src) 98.6 F (37 C) (Oral)  Resp 18  Ht 5' 2" (1.575 m)  Wt 153 lb 12.8 oz (69.763 kg)  BMI 28.12 kg/m2  SpO2 100%  LMP 06/25/2014 Exam: General: NAD Eyes: EOMI; PERRLA ENTM: MM mildly dry Neck: thyroid nonpalpable; no cervical adenopathy Cardiovascular: RRR no m/r/g; radial pulses 2+ bilaterally Respiratory: CTAB; Normal effort Abdomen: Soft, nondistended; mild epigastric tenderness; no rebounding; BS + Skin: no rashes Neuro: A&Ox3; gross sensory and motor intact Psych: Normal affect and attention  Labs and Imaging: Results for orders placed or performed during the hospital encounter of 09/10/14 (from the past 72 hour(s))  CBG monitoring, ED     Status: Abnormal   Collection Time: 09/10/14 10:00 AM  Result Value Ref Range   Glucose-Capillary 433 (H) 65 - 99 mg/dL  Comprehensive metabolic panel     Status: Abnormal   Collection Time: 09/10/14 10:34 AM  Result Value Ref Range   Sodium 131 (L) 135 - 145 mmol/L   Potassium 4.3 3.5 - 5.1 mmol/L   Chloride 99 (L) 101 - 111 mmol/L   CO2 16 (L) 22 - 32 mmol/L   Glucose, Bld 428 (H) 65 - 99 mg/dL   BUN 10 6 - 20 mg/dL   Creatinine, Ser 0.91 0.44 - 1.00 mg/dL   Calcium 9.8 8.9 - 10.3 mg/dL   Total Protein 7.8 6.5 - 8.1 g/dL   Albumin 4.6 3.5 - 5.0 g/dL   AST 12 (L) 15 - 41 U/L   ALT 31 14 - 54 U/L   Alkaline Phosphatase 106 38 - 126 U/L   Total Bilirubin 1.5 (H) 0.3 - 1.2 mg/dL   GFR calc non Af Amer >60 >60 mL/min   GFR calc Af Amer >60 >60 mL/min    Comment: (NOTE) The eGFR has been calculated using the CKD EPI equation. This calculation has not been validated in all clinical situations. eGFR's persistently <60 mL/min signify possible Chronic Kidney Disease.    Anion gap 16 (H) 5 - 15  Urinalysis, Routine w reflex microscopic     Status: Abnormal   Collection Time: 09/10/14 10:34 AM  Result Value Ref Range    Color, Urine YELLOW YELLOW   APPearance CLEAR CLEAR   Specific Gravity, Urine 1.044 (H) 1.005 - 1.030   pH 5.5 5.0 - 8.0   Glucose, UA >1000 (A) NEGATIVE mg/dL   Hgb urine dipstick TRACE (A) NEGATIVE   Bilirubin Urine NEGATIVE NEGATIVE   Ketones, ur >80 (A) NEGATIVE mg/dL   Protein, ur NEGATIVE NEGATIVE mg/dL   Urobilinogen, UA 0.2 0.0 - 1.0 mg/dL   Nitrite NEGATIVE NEGATIVE   Leukocytes, UA NEGATIVE NEGATIVE  CBC with Differential/Platelet     Status: Abnormal   Collection Time: 09/10/14 10:34 AM  Result Value Ref Range   WBC 14.2 (H) 4.0 - 10.5 K/uL   RBC 5.13 (H) 3.87 - 5.11 MIL/uL   Hemoglobin 16.3 (H) 12.0 -  15.0 g/dL   HCT 47.4 (H) 36.0 - 46.0 %   MCV 92.4 78.0 - 100.0 fL   MCH 31.8 26.0 - 34.0 pg   MCHC 34.4 30.0 - 36.0 g/dL   RDW 12.2 11.5 - 15.5 %   Platelets 409 (H) 150 - 400 K/uL   Neutrophils Relative % 81 (H) 43 - 77 %   Neutro Abs 11.5 (H) 1.7 - 7.7 K/uL   Lymphocytes Relative 13 12 - 46 %   Lymphs Abs 1.8 0.7 - 4.0 K/uL   Monocytes Relative 6 3 - 12 %   Monocytes Absolute 0.8 0.1 - 1.0 K/uL   Eosinophils Relative 0 0 - 5 %   Eosinophils Absolute 0.0 0.0 - 0.7 K/uL   Basophils Relative 0 0 - 1 %   Basophils Absolute 0.0 0.0 - 0.1 K/uL  Lipase, blood     Status: None   Collection Time: 09/10/14 10:34 AM  Result Value Ref Range   Lipase 28 22 - 51 U/L  Pregnancy, urine     Status: None   Collection Time: 09/10/14 10:34 AM  Result Value Ref Range   Preg Test, Ur NEGATIVE NEGATIVE    Comment:        THE SENSITIVITY OF THIS METHODOLOGY IS >20 mIU/mL.   Urine microscopic-add on     Status: Abnormal   Collection Time: 09/10/14 10:34 AM  Result Value Ref Range   Squamous Epithelial / LPF MANY (A) RARE   WBC, UA 7-10 <3 WBC/hpf   RBC / HPF 0-2 <3 RBC/hpf   Bacteria, UA FEW (A) RARE  CBG monitoring, ED     Status: Abnormal   Collection Time: 09/10/14  1:46 PM  Result Value Ref Range   Glucose-Capillary 287 (H) 65 - 99 mg/dL    Olam Idler,  MD 09/10/2014, 3:43 PM PGY-2, [place] Intern pager: [phone], text pages welcome

## 2014-09-10 NOTE — ED Notes (Signed)
Patient given Or

## 2014-09-10 NOTE — Progress Notes (Addendum)
Patient complaining of abdominal pain after drinking prune juice, patient also stated that she has not had a bowel movement since last Tuesday (09/03/14). Dr. Gayla DossJoyner notified as tylenol 650mg  PO prn dose not yet due. Morphine 1mg  IV prn dose placed by MD. No other orders placed at this time. Will continue to monitor

## 2014-09-10 NOTE — ED Provider Notes (Signed)
CSN: [id number]     Arrival date & time 09/10/14  4098 History   First MD Initiated Contact with Patient 09/10/14 1000     Chief Complaint  Patient presents with  . Hyperglycemia     (Consider location/radiation/quality/duration/timing/severity/associated sxs/prior Treatment) Patient is a 34 y.o. female presenting with hyperglycemia and abdominal pain. The history is provided by the patient.  Hyperglycemia Associated symptoms: abdominal pain, dysuria, nausea and vomiting   Associated symptoms: no chest pain, no dizziness, no fatigue, no fever and no shortness of breath   Abdominal Pain Pain location:  Epigastric Pain quality: burning   Pain radiates to:  Does not radiate Pain severity:  Moderate Onset quality:  Gradual Duration:  1 week Timing:  Constant Progression:  Unchanged Chronicity:  New Context comment:   after drinking an energy drink. Relieved by:  Nothing Worsened by:  Nothing tried Ineffective treatments: tramadol, nexium. Associated symptoms: dysuria, nausea and vomiting   Associated symptoms: no chest pain, no cough, no diarrhea, no fatigue, no fever, no hematuria and no shortness of breath     Past Medical History  Diagnosis Date  . Hypertension   . Pregnancy induced hypertension   . 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 Gestational diabetes    Past Surgical History  Procedure Laterality Date  . Cesarean section    . Cesarean section N/A 07/05/2012    Procedure: CESAREAN SECTION;  Surgeon: Catalina Antigua, MD;  Location: [facility];  Service: Obstetrics;  Laterality: N/A;   Family History  Problem Relation Age of Onset  . Diabetes Mother    History  Substance Use Topics  . Smoking status: Never Smoker   . Smokeless tobacco: Never Used  . Alcohol Use: No   OB History    Gravida Para Term Preterm AB TAB SAB Ectopic Multiple Living   Review of Systems  Constitutional: Negative for fever and fatigue.   HENT: Negative for congestion and drooling.   Eyes: Negative for pain.  Respiratory: Negative for cough and shortness of breath.   Cardiovascular: Negative for chest pain.  Gastrointestinal: Positive for nausea, vomiting and abdominal pain. Negative for diarrhea.  Genitourinary: Positive for dysuria. Negative for hematuria.  Musculoskeletal: Negative for back pain, gait problem and neck pain.  Skin: Negative for color change.  Neurological: Negative for dizziness and headaches.  Hematological: Negative for adenopathy.  Psychiatric/Behavioral: Negative for behavioral problems.  All other systems reviewed and are negative.     Allergies  Review of patient's allergies indicates no known allergies.  Home Medications   Prior to Admission medications   Medication Sig Start Date End Date Taking? Authorizing Provider  esomeprazole (NEXIUM) 40 MG capsule Take 1 capsule (40 mg total) by mouth daily. 09/07/14   Richardean Canal, MD  metFORMIN (GLUCOPHAGE) 500 MG tablet Take 1 tablet (500 mg total) by mouth 2 (two) times daily with a meal. 09/07/14   Richardean Canal, MD  traMADol (ULTRAM) 50 MG tablet Take 1 tablet (50 mg total) by mouth every 6 (six) hours as needed. 09/07/14   Richardean Canal, MD   BP 138/85 mmHg  Pulse 116  Temp(Src) 98.3 F (36.8 C) (Oral)  Resp 16  Ht  (1.575 m)  Wt 145 lb (65.772 kg)  BMI 26.51 kg/m2  SpO2 100%  LMP 06/25/2014 Physical Exam  Constitutional: She is oriented to person, place, and  time. She appears well-developed and well-nourished.  HENT:  Head: Normocephalic.  Mouth/Throat: Oropharynx is clear and moist. No oropharyngeal exudate.  Eyes: Conjunctivae and EOM are normal. Pupils are equal, round, and reactive to light.  Neck: Normal range of motion. Neck supple.  Cardiovascular: Normal rate, regular rhythm, normal heart sounds and intact distal pulses.  Exam reveals no gallop and no friction rub.   No murmur heard. Pulmonary/Chest: Effort normal and breath  sounds normal. No respiratory distress. She has no wheezes.  Abdominal: Soft. Bowel sounds are normal. There is tenderness (mild epig ttp). There is no rebound and no guarding.  Musculoskeletal: Normal range of motion. She exhibits no edema or tenderness.  Neurological: She is alert and oriented to person, place, and time.  Skin: Skin is warm and dry.  Psychiatric: She has a normal mood and affect. Her behavior is normal.  Nursing note and vitals reviewed.   ED Course  Procedures (including critical care time) Labs Review Labs Reviewed  COMPREHENSIVE METABOLIC PANEL - Abnormal; Notable for the following:    Sodium 131 (*)    Chloride 99 (*)    CO2 16 (*)    Glucose, Bld 428 (*)    AST 12 (*)    Total Bilirubin 1.5 (*)    Anion gap 16 (*)    All other components within normal limits  URINALYSIS, ROUTINE W REFLEX MICROSCOPIC - Abnormal; Notable for the following:    Specific Gravity, Urine 1.044 (*)    Glucose, UA >1000 (*)    Hgb urine dipstick TRACE (*)    Ketones, ur >80 (*)    All other components within normal limits  CBC WITH DIFFERENTIAL/PLATELET - Abnormal; Notable for the following:    WBC 14.2 (*)    RBC 5.13 (*)    Hemoglobin 16.3 (*)    HCT 47.4 (*)    Platelets 409 (*)    Neutrophils Relative % 81 (*)    Neutro Abs 11.5 (*)    All other components within normal limits  URINE MICROSCOPIC-ADD ON - Abnormal; Notable for the following:    Squamous Epithelial / LPF MANY (*)    Bacteria, UA FEW (*)    All other components within normal limits  LIPID PANEL - Abnormal; Notable for the following:    Cholesterol 212 (*)    HDL 28 (*)    LDL Cholesterol 159 (*)    All other components within normal limits  HEMOGLOBIN A1C - Abnormal; Notable for the following:    Hgb A1c MFr Bld 9.9 (*)    All other components within normal limits  TROPONIN I - Abnormal; Notable for the following:    Troponin I 0.06 (*)    All other components within normal limits  URINE RAPID DRUG  SCREEN (HOSP PERFORMED) - Abnormal; Notable for the following:    Tetrahydrocannabinol POSITIVE (*)    Barbiturates POSITIVE (*)    All other components within normal limits  BASIC METABOLIC PANEL - Abnormal; Notable for the following:    CO2 18 (*)    Glucose, Bld 263 (*)    Calcium 8.7 (*)    All other components within normal limits  BASIC METABOLIC PANEL - Abnormal; Notable for the following:    CO2 19 (*)    Glucose, Bld 164 (*)    Calcium 8.7 (*)    All other components within normal limits  GLUCOSE, CAPILLARY - Abnormal; Notable for the following:    Glucose-Capillary 253 (*)  All other components within normal limits  BASIC METABOLIC PANEL - Abnormal; Notable for the following:    Sodium 134 (*)    CO2 21 (*)    Glucose, Bld 189 (*)    Calcium 8.6 (*)    All other components within normal limits  GLUCOSE, CAPILLARY - Abnormal; Notable for the following:    Glucose-Capillary 279 (*)    All other components within normal limits  TROPONIN I - Abnormal; Notable for the following:    Troponin I 0.05 (*)    All other components within normal limits  GLUCOSE, CAPILLARY - Abnormal; Notable for the following:    Glucose-Capillary 146 (*)    All other components within normal limits  GLUCOSE, CAPILLARY - Abnormal; Notable for the following:    Glucose-Capillary 217 (*)    All other components within normal limits  GLUCOSE, CAPILLARY - Abnormal; Notable for the following:    Glucose-Capillary 210 (*)    All other components within normal limits  GLUCOSE, CAPILLARY - Abnormal; Notable for the following:    Glucose-Capillary 217 (*)    All other components within normal limits  CBG MONITORING, ED - Abnormal; Notable for the following:    Glucose-Capillary 433 (*)    All other components within normal limits  CBG MONITORING, ED - Abnormal; Notable for the following:    Glucose-Capillary 287 (*)    All other components within normal limits  LIPASE, BLOOD  PREGNANCY, URINE   TSH  TROPONIN I  TROPONIN I  TROPONIN I  TROPONIN I  C-PEPTIDE  BASIC METABOLIC PANEL  CBG MONITORING, ED    Imaging Review No results found.   EKG Interpretation None      MDM   Final diagnoses:  Diabetic ketoacidosis without coma associated with other specified diabetes mellitus    10:44 AM 34 y.o. female  With a history of gestational diabetes who presents with continued epigastric pain and elevated blood sugar.  She was seen recently for upper abdominal pain and vomiting. She states that 3 days prior to her last visit she had an energy drink and began vomiting afterwards. She subsequently developed epigastric burning pain. She was seen in the ER recently and had a noncontributory workup including CT scan of the abdomen as well as ultrasound. She denies any fevers but has had ongoing vomiting and epigastric pain despite taking the medications prescribed. She is mildly tachycardic here and vital signs are otherwise unremarkable. We'll get screening lab work and IV fluids. We'll treat symptomatically.  Will start insulin gtt. Pt will be admitted to family service (unassigned pt).     Purvis SheffieldForrest Shekela Goodridge, MD 09/11/14 1228

## 2014-09-10 NOTE — ED Notes (Addendum)
Pt has had diabetes with pregnancy but went to UC the other day because she was vomiting and not feeling well. Hasn't had anything to eat the past few days. Pt thought it went away. Informed that diabetes is not something that goes away but can be managed with medication and diet. Verbalized understanding.

## 2014-09-10 NOTE — Progress Notes (Signed)
RN called for report.  

## 2014-09-11 DIAGNOSIS — R079 Chest pain, unspecified: Secondary | ICD-10-CM

## 2014-09-11 DIAGNOSIS — E1165 Type 2 diabetes mellitus with hyperglycemia: Secondary | ICD-10-CM

## 2014-09-11 LAB — BASIC METABOLIC PANEL
Anion gap: 10 (ref 5–15)
Anion gap: 6 (ref 5–15)
Anion gap: 8 (ref 5–15)
BUN: 8 mg/dL (ref 6–20)
BUN: 10 mg/dL (ref 6–20)
BUN: 10 mg/dL (ref 6–20)
CHLORIDE: 107 mmol/L (ref 101–111)
CHLORIDE: 107 mmol/L (ref 101–111)
CO2: 19 mmol/L — ABNORMAL LOW (ref 22–32)
CO2: 21 mmol/L — ABNORMAL LOW (ref 22–32)
CO2: 23 mmol/L (ref 22–32)
CREATININE: 0.64 mg/dL (ref 0.44–1.00)
CREATININE: 0.57 mg/dL (ref 0.44–1.00)
Calcium: 8.7 mg/dL — ABNORMAL LOW (ref 8.9–10.3)
Calcium: 8.6 mg/dL — ABNORMAL LOW (ref 8.9–10.3)
Calcium: 8.4 mg/dL — ABNORMAL LOW (ref 8.9–10.3)
Chloride: 101 mmol/L (ref 101–111)
Creatinine, Ser: 0.67 mg/dL (ref 0.44–1.00)
GFR calc Af Amer: >60 mL/min (ref >60)
GFR calc Af Amer: >60 mL/min (ref >60)
GFR calc Af Amer: >60 mL/min (ref >60)
GFR calc non Af Amer: >60 mL/min (ref >60)
GFR calc non Af Amer: >60 mL/min (ref >60)
GLUCOSE: 239 mg/dL — AB (ref 65–99)
Glucose, Bld: 164 mg/dL — ABNORMAL HIGH (ref 65–99)
Glucose, Bld: 189 mg/dL — ABNORMAL HIGH (ref 65–99)
POTASSIUM: 3.7 mmol/L (ref 3.5–5.1)
Potassium: 3.6 mmol/L (ref 3.5–5.1)
Potassium: 3.3 mmol/L — ABNORMAL LOW (ref 3.5–5.1)
SODIUM: 134 mmol/L — AB (ref 135–145)
SODIUM: 132 mmol/L — AB (ref 135–145)
Sodium: 136 mmol/L (ref 135–145)

## 2014-09-11 LAB — GLUCOSE, CAPILLARY
GLUCOSE-CAPILLARY: 217 mg/dL — AB (ref 65–99)
Glucose-Capillary: 146 mg/dL — ABNORMAL HIGH (ref 65–99)
Glucose-Capillary: 162 mg/dL — ABNORMAL HIGH (ref 65–99)
Glucose-Capillary: 210 mg/dL — ABNORMAL HIGH (ref 65–99)
Glucose-Capillary: 217 mg/dL — ABNORMAL HIGH (ref 65–99)
Glucose-Capillary: 222 mg/dL — ABNORMAL HIGH (ref 65–99)

## 2014-09-11 LAB — TROPONIN I
TROPONIN I: 0.05 ng/mL — AB (ref <0.031)
Troponin I: <0.03 ng/mL (ref <0.031)

## 2014-09-11 LAB — HEMOGLOBIN A1C
HEMOGLOBIN A1C: 9.9 % — AB (ref 4.8–5.6)
MEAN PLASMA GLUCOSE: 237 mg/dL

## 2014-09-11 MED ORDER — GI COCKTAIL ~~LOC~~
30.0000 mL | Freq: Two times a day (BID) | ORAL | Status: DC | PRN
  Filled 2014-09-11: qty 30

## 2014-09-11 MED ORDER — INSULIN GLARGINE 100 UNIT/ML ~~LOC~~ SOLN
10.0000 [IU] | Freq: Every day | SUBCUTANEOUS | Status: DC
  Administered 2014-09-11: 10 [IU] via SUBCUTANEOUS
  Filled 2014-09-11 (×2): qty 0.1

## 2014-09-11 MED ORDER — INSULIN ASPART 100 UNIT/ML ~~LOC~~ SOLN
0.0000 [IU] | Freq: Three times a day (TID) | SUBCUTANEOUS | Status: DC
  Administered 2014-09-11 (×2): 5 [IU] via SUBCUTANEOUS
  Administered 2014-09-12: 8 [IU] via SUBCUTANEOUS
  Administered 2014-09-12: 5 [IU] via SUBCUTANEOUS

## 2014-09-11 MED ORDER — PANTOPRAZOLE SODIUM 40 MG PO TBEC
40.0000 mg | DELAYED_RELEASE_TABLET | Freq: Every day | ORAL | Status: DC
  Administered 2014-09-11 – 2014-09-12 (×2): 40 mg via ORAL
  Filled 2014-09-11 (×2): qty 1

## 2014-09-11 MED ORDER — POLYETHYLENE GLYCOL 3350 17 G PO PACK
17.0000 g | PACK | Freq: Every day | ORAL | Status: DC
  Administered 2014-09-11 – 2014-09-12 (×2): 17 g via ORAL
  Filled 2014-09-11 (×2): qty 1

## 2014-09-11 MED ORDER — ATORVASTATIN CALCIUM 20 MG PO TABS
20.0000 mg | ORAL_TABLET | Freq: Every day | ORAL | Status: DC
  Administered 2014-09-11: 20 mg via ORAL
  Filled 2014-09-11 (×2): qty 1

## 2014-09-11 MED ORDER — METFORMIN HCL 500 MG PO TABS
500.0000 mg | ORAL_TABLET | Freq: Two times a day (BID) | ORAL | Status: DC
  Administered 2014-09-11 – 2014-09-12 (×2): 500 mg via ORAL
  Filled 2014-09-11 (×4): qty 1

## 2014-09-11 MED ORDER — LIVING WELL WITH DIABETES BOOK
Freq: Once | Status: AC
  Administered 2014-09-11: 22:00:00
  Filled 2014-09-11: qty 1

## 2014-09-11 MED ORDER — INSULIN ASPART 100 UNIT/ML ~~LOC~~ SOLN: 0.0000 [IU] | Freq: Every day | SUBCUTANEOUS | Status: DC

## 2014-09-11 NOTE — Discharge Summary (Signed)
[facility] Discharge Summary  Patient name: Cheryl Parker Medical record number: [id number] Date of birth: 1980-12-24 Age: 34 y.o. Gender: female Date of Admission: 09/10/2014  Date of Discharge: 5/192016 Admitting Physician: Kinnie Feil, MD  Primary Care Provider: No primary care provider on file. Consultants: None  Indication for Hospitalization: DKA  Discharge Diagnoses/Problem List:  Diabetic Ketoacidosis, Type 2 Diabetes Mellitus, Hyperlipidemia, Epigastric pain, Chest pain  Disposition: Home  Discharge Condition: Improved  Discharge Exam:  Blood pressure 126/76, pulse 109, temperature 98.5 F (36.9 C), temperature source Oral, resp. rate 20, height _0  (1.575 m), weight 153 lb 12.8 oz (69.763 kg), SpO2 100 %,  General: NAD, lying in hospital bed Cardiovascular: RRR, no murmurs Respiratory: NWOB, CTAB Abdomen: +BS, NT, ND Extremities: WWP, no cyanosis Neuro: Alert. No focal deficits.   Brief Hospital Course:  Cheryl Parker is a 34 year old female with recent new diagnosis of diabetes who presented with DKA in the setting of 1 week of poor PO intake and vomiting. Her hospital course by problem is outlined below:  DKA / Type 2 Diabetes Mellitus. Labs on admission were remarkable for increased anion gap metabolic acidosis (bicarb 16, AG 16), hyperglycemia (CBG 433), and ketonuria. Given her mild acidosis, we elected to not start an insulin drip, and instead gave the patient subcutaneous insulin. We additionally fluid resuscitated her with IVF. Her acidosis quickly resolved. Work up while here revealed an A1C of 9.9, and a minimally decreased C-peptide (1.0). Patient received extensive diabetes education. She stated that she would prefer to not be discharged on insulin. We started the patient on metformin and Januvia. We also started the patient on atorvastatin and a daily aspirin. She remained stable throughout her stay and was discharged to  have close follow up with the [facility].   Epigastric / Chest Pain. Patient reported epigastric and chest pain on admission. This was likely thought to be related to her persistent vomiting and retching prior to admission. In the ED, the patient had an abdominal CT which was negative, and a normal lipase. Her EKGs showed no signs of ischemia, and she had negative troponins x4 (after initially being elevated to 0.05). We started the patient on a PPI and her symptoms improved and were resolved by the time of discharge.   Issues for Follow Up:  1. Follow up glucose control - Patient discharged on metformin and Januvia, but will likely need basal insulin in the near future 2. Follow up epigastric pain - Thought to be related to retching/vomiting, but would consider further investigation if recurs.   Significant Procedures: None  Significant Labs and Imaging:   Recent Labs Lab 09/07/14 1242 09/10/14 1034  WBC 14.6* 14.2*  HGB 15.4* 16.3*  HCT 44.6 47.4*  PLT 400 409*    Recent Labs Lab 09/10/14 1034 09/10/14 1715 09/11/14 0020 09/11/14 0536 09/11/14 1626 09/12/14 0347  NA 131* 135 136 134* 132* 136  K 4.3 3.9 3.7 3.6 3.3* 3.2*  CL 99* 105 107 107 101 103  CO2 16* 18* 19* 21* 23 25  GLUCOSE 428* 263* 164* 189* 239* 164*  BUN _1 CREATININE 0.91 0.69 0.67 0.64 0.57 0.60  CALCIUM 9.8 8.7* 8.7* 8.6* 8.4* 8.5*  ALKPHOS 106  --   --   --   --   --   AST 12*  --   --   --   --   --  ALT 31  --   --   --   --   --   ALBUMIN 4.6  --   --   --   --   --      Recent Labs Lab 09/11/14 1028 09/11/14 1626 09/11/14 2120 09/12/14 0347 09/12/14 1110  TROPONINI 0.05* <0.03 <0.03 <0.03 <0.03    Recent Labs Lab 09/11/14 1210 09/11/14 1712 09/11/14 2112 09/12/14 0744 09/12/14 1129  GLUCAP 217* 222* 162* 254* 239*   Urinalysis    Component Value Date/Time   COLORURINE YELLOW 09/10/2014 [place] 09/10/2014 1034   LABSPEC 1.044*  09/10/2014 1034   PHURINE 5.5 09/10/2014 1034   GLUCOSEU >1000* 09/10/2014 1034   HGBUR TRACE* 09/10/2014 1034   [employer] 09/10/2014 1034   KETONESUR >80* 09/10/2014 1034   PROTEINUR NEGATIVE 09/10/2014 1034   UROBILINOGEN 0.2 09/10/2014 1034   NITRITE NEGATIVE 09/10/2014 1034   LEUKOCYTESUR NEGATIVE 09/10/2014 1034   UDS: Positive for THC and barbiturates  FLP: total cholesterol 212, HDL 28, LDL 159 TSH 0.718 A1c 9.9 C-peptide 1.0 Lipase 28  EKG: NSR, no acute ischemic changes.   CT Abdomen 5/14 FINDINGS: There is focal fatty infiltration of the liver along the falciform ligament. The liver is otherwise normal. The spleen, pancreas, gallbladder, adrenal glands and kidneys are normal. The aorta is normal. There is no abdominal lymphadenopathy. There is no small bowel obstruction or diverticulitis. The appendix is normal. Fluid-filled bladder is normal. The uterus is normal. There is a 1.9 x 1.6 cm cyst in normal size right ovary, likely follicular cyst. The visualized lung bases are clear. No acute abnormalities identified within the visualized bones. IMPRESSION: No acute abnormality identified in the abdomen and pelvis.   Results/Tests Pending at Time of Discharge: None  Discharge Medications:    Medication List    TAKE these medications        ACCU-CHEK FASTCLIX LANCETS Misc  Use 1-4 times daily as needed to check blood glucose.     ACCU-CHEK NANO SMARTVIEW W/DEVICE Kit  Used daily as needed to check blood glucose     acetaminophen 325 MG tablet  Commonly known as:  TYLENOL  Take 650 mg by mouth every 6 (six) hours as needed for mild pain.     aspirin 81 MG chewable tablet  Chew 1 tablet (81 mg total) by mouth daily.     esomeprazole 40 MG capsule  Commonly known as:  NEXIUM  Take 1 capsule (40 mg total) by mouth daily.     glucose blood test strip  Commonly known as:  ACCU-CHEK SMARTVIEW  Use 1-4 times daily as needed to check blood  glucose.     metFORMIN 500 MG tablet  Commonly known as:  GLUCOPHAGE  Take 1 tablet (500 mg total) by mouth 2 (two) times daily with a meal.     traMADol 50 MG tablet  Commonly known as:  ULTRAM  Take 1 tablet (50 mg total) by mouth every 6 (six) hours as needed.      ASK your doctor about these medications        atorvastatin 20 MG tablet  Commonly known as:  LIPITOR  TAKE 1 TABLET BY MOUTH DAILY AT 6 PM  Ask about: Which instructions should I use?     JANUVIA 100 MG tablet  Generic drug:  sitaGLIPtin  TAKE 1 TABLET BY MOUTH DAILY  Ask about: Which instructions should I use?  Discharge Instructions: Please refer to Patient Instructions section of EMR for full details.  Patient was counseled important signs and symptoms that should prompt return to medical care, changes in medications, dietary instructions, activity restrictions, and follow up appointments.   Follow-Up Appointments: Follow-up Information    Follow up with [facility] On 09/18/2014.   Why:  09:30. Please come 15 minutes prior to your appointment time. If you are unable to keep your appointment please call to rechedule.    Contact information:   [place] [id number] [phone]      Vivi Barrack, MD 09/13/2014, 11:53 AM PGY-1, [facility]

## 2014-09-11 NOTE — Progress Notes (Signed)
Nutrition Brief Note  Patient identified on the Malnutrition Screening Tool (MST) Report and received consult for diabetes education.   Lab Results  Component Value Date   HGBA1C 9.9* 09/10/2014   Wt Readings from Last 15 Encounters:  09/10/14 153 lb 12.8 oz (69.763 kg)  09/07/14 145 lb (65.772 kg)  07/06/12 185 lb (83.915 kg)  07/03/12 189 lb 11.2 oz (86.047 kg)  06/29/12 190 lb 1.6 oz (86.229 kg)  06/26/12 187 lb 3.2 oz (84.913 kg)  06/23/12 187 lb 9.6 oz (85.095 kg)  06/19/12 185 lb 12.8 oz (84.278 kg)  06/12/12 185 lb 9.6 oz (84.188 kg)  06/05/12 180 lb (81.647 kg)  05/22/12 177 lb 6.4 oz (80.468 kg)   Cheryl Parker is a 34 y.o. female presenting with epigastric abdominal pain associated with anorexia. She reports epigastric pain for the past week associated with nausea, vomiting and decreased appetite. She reports being evaluated in urgent care 2 days ago was diagnosed with diabetes. She was prescribed metformin which she has filled; however, due to her pain and nausea she has been unable to eat. Abdominal pain is sharp and nonradiating. She has history of C-section 2, but no other abdominal surgeries. Denies dyspepsia, reflux or right upper quadrant pain. Denies hematemesis. Denies fevers, chills, URI symptoms, cough, or dysuria. Endorses sharp left sided chest pain today while in the ED that is worse with movement. Denies any dyspnea. History of gestational diabetes. No PCP follow-up and last 3 years.   Pt reports poor oral intake and weakness over the past 2 weeks due to abdominal pain. She reveals that she could only consume small bites of food initially, but as the problem progressed she was vomiting up all intake, even liquids. She reveals that her appetite has returned and she consumed an entire cheeseburger for lunch today. Nutrition-focused physical exam revealed no signs of fat and muscle depletion.  Pt reports UBW is 145#, which she has consistently weighed since  he son was born two years ago. She confirms that she had gestational diabetes with both pregnancies (pt has a 35 year old son and 35 year old daughter). She reports that she was very compliant with self management practices during pregnancy and took insulin at this time. She was on Metformin PTA, but both pt and RN confirm that this gave her severe abdominal pain. She also reveals she has a strong family hx of diabetes (her mother and multiple family members on her mother's side have diabetes). She expressed concern over her diagnosis, reporting multiple times "I don't want to end up here like this again". She is motivated to make lifestyle changes and asked very good questions throughout session. She is hopeful that her positive lifestyle changes will also prevent onset of diabetes in her children.   RD provided "Plate Method" handout. Discussed different food groups and their effects on blood sugar, emphasizing carbohydrate-containing foods. Provided list of carbohydrates and recommended serving sizes of common foods.  Discussed importance of controlled and consistent carbohydrate intake throughout the day. Provided examples of ways to balance meals/snacks and encouraged intake of high-fiber, whole grain complex carbohydrates. Teach back method used.  Expect fair compliance.  Body mass index is 28.12 kg/(m^2). Patient meets criteria for overweight based on current BMI.   Current diet order is Carb Modified, patient is consuming approximately 25-75% of meals at this time. RN revealed that pt is also consuming outside food (fast food cheeseburgers) brought in by family members. Labs and medications reviewed.  No nutrition interventions warranted at this time. If nutrition issues arise, please consult RD.   Bryanah Sidell A. Mayford KnifeWilliams, RD, LDN, CDE Pager: [phone] After hours Pager: [phone]

## 2014-09-11 NOTE — Progress Notes (Signed)
Family Medicine Teaching Service Daily Progress Note Intern Pager: [phone]  Patient name: Cheryl Parker Medical record number: [id number] Date of birth: May 12, 1980 Age: 34 y.o. Gender: female  Primary Care Provider: No primary care provider on file. Consultants: None Code Status: Full  Pt Overview and Major Events to Date:  5/17 - Admitted with mild DKA  Assessment and Plan: Cheryl Parker is a 34 y.o. female presenting with mild DKA and epigastric abdominal pain. PMH is significant for gestational diabetes.   # DKA, new onset diabetes: Mild. Admit labs: (Bicarb 16; AG 16; cbg 433). DKA likely due to new onset diabetes; No current signs of infection or MI. History of gestational diabetes; no PCP f/u in last 3 years. Likely component of dehydration and poor PO intake playing a role in initial lab abnormalities.  - CBGs and SSI with meals - Lantus 10U daily, Consider adjusting long acting insulin based on daily SSI needs,and c-peptide levels - Metformin 500mg  bid - A1c 9.9 - Zofran prn - Consult care management for assistance with establishing PCP  - Will check c peptide - Needs extensive diabetes education prior to discharge  # Epigastric abdominal pain: Likely due to DKA and persistent vomiting/retching prior to admission. Can consider GERD but patient has no reflux symptoms. CT abdomen 09/07/14 Negative. Lipase wnl - Continue to monitor  - Consider further investigations if not improving - Start PPI - GI cocktail prn  #HLD. FLP: total cholesterol 212, HDL 28, LDL 159 - Start atorvastatin here.   # Constipation.  - Miralax daily  # Chest Pain: Atypical: worse with movement and palpation. Likely associated with epigastric pain noted above. EKGs stable - Troponin: 0.06, <0.03, 0.03  Disposition: Admitted pending above management.   Subjective:  No further episodes of vomiting, but was unable to eat much breakfast. No BM in about a week.  Objective: Temp:  [98.3 F  (36.8 C)-99.6 F (37.6 C)] 98.8 F (37.1 C) (05/18 0559) Pulse Rate:  [85-116] 85 (05/18 0559) Resp:  [13-25] 13 (05/18 0559) BP: (112-148)/(58-93) 122/67 mmHg (05/18 0559) SpO2:  [99 %-100 %] 100 % (05/18 0559) Weight:  [145 lb (65.772 kg)-153 lb 12.8 oz (69.763 kg)] 153 lb 12.8 oz (69.763 kg) (05/17 1532) Physical Exam: General: NAD, lying in hospital bed Cardiovascular: RRR, no murmurs Respiratory: NWOB, CTAB Abdomen: +BD, tender in epigastrum, S, ND Extremities: WWP, no cyanosis Neuro: Alert. No focal deficits.   Laboratory/Imaging:  Recent Labs Lab 09/07/14 1242 09/10/14 1034  WBC 14.6* 14.2*  HGB 15.4* 16.3*  HCT 44.6 47.4*  PLT 400 409*    Recent Labs Lab 09/10/14 1034 09/10/14 1715 09/11/14 0020 09/11/14 0536  NA 131* 135 136 134*  K 4.3 3.9 3.7 3.6  CL 99* 105 107 107  CO2 16* 18* 19* 21*  BUN 10 9 8 10   CREATININE 0.91 0.69 0.67 0.64  CALCIUM 9.8 8.7* 8.7* 8.6*  PROT 7.8  --   --   --   BILITOT 1.5*  --   --   --   ALKPHOS 106  --   --   --   ALT 31  --   --   --   AST 12*  --   --   --   GLUCOSE 428* 263* 164* 189*    Recent Labs Lab 09/10/14 1642 09/10/14 2013 09/11/14 0020 09/11/14 0403 09/11/14 0806  GLUCAP 253* 279* 146* 217* 210*     Recent Labs Lab 09/10/14 1715 09/10/14 2132 09/11/14 0329  TROPONINI 0.06* <0.03 <0.03   UDS: Positive for THC and barbiturates  FLP: total cholesterol 212, HDL 28, LDL 159 TSH 0.718 W1XA1c 9.9  EKG: NSR, no acute ischemic changes.   [name], MD 09/11/2014, 8:36 AM PGY-1, [facility] FPTS Intern pager: [phone], text pages welcome

## 2014-09-11 NOTE — Progress Notes (Addendum)
RN provided patient with Living Well with Diabetes booklet. RN spent time answering questions and discussing lifestyle changes with patient who was eager to learn and was able to provide teachback, she verbalized the teaching that the dietician and diabetes coordinator provided her today. Patient able to demonstrate and self-inject insulin. Patient stated that she would go through some of the booklet tonight and let RN know if she has any further questions.

## 2014-09-11 NOTE — Progress Notes (Signed)
MD - Patient complaining of "gas" and feeling constipated this morning. Has not had bowel movement since 5/10.

## 2014-09-11 NOTE — Progress Notes (Signed)
Inpatient Diabetes Program Recommendations  AACE/ADA: New Consensus Statement on Inpatient Glycemic Control  Target Ranges:  Prepandial:   less than 140 mg/dL      Peak postprandial:   less than 180 mg/dL (1-2 hours)      Critically ill patients:  140 - 180 mg/dL  Pager:  [phone]    Reason for Visit: Patient with a history of Gestational DM.  Now tranistioned to DM.  Ordered inpatient DM education as well as RD Consult and OPED.   Consider starting Metformin as outpatient treatment.  Inpatient Diabetes Program Recommendations Outpatient Referral: Ordered OPED Diet: Ordered RD consult  Alfredia Clientrissy Shoshannah Faubert PhD, RN, BC-ADM Diabetes Coordinator  Office:  [phone] Team Pager:  [phone]

## 2014-09-11 NOTE — Progress Notes (Signed)
Utilization review completed. Reene Harlacher, RN, BSN. 

## 2014-09-12 ENCOUNTER — Other Ambulatory Visit: Payer: Self-pay | Admitting: Family Medicine

## 2014-09-12 DIAGNOSIS — E785 Hyperlipidemia, unspecified: Secondary | ICD-10-CM | POA: Insufficient documentation

## 2014-09-12 LAB — BASIC METABOLIC PANEL
Anion gap: 8 (ref 5–15)
BUN: 8 mg/dL (ref 6–20)
CO2: 25 mmol/L (ref 22–32)
Calcium: 8.5 mg/dL — ABNORMAL LOW (ref 8.9–10.3)
Chloride: 103 mmol/L (ref 101–111)
Creatinine, Ser: 0.6 mg/dL (ref 0.44–1.00)
GFR calc Af Amer: >60 mL/min (ref >60)
GFR calc non Af Amer: >60 mL/min (ref >60)
GLUCOSE: 164 mg/dL — AB (ref 65–99)
Potassium: 3.2 mmol/L — ABNORMAL LOW (ref 3.5–5.1)
SODIUM: 136 mmol/L (ref 135–145)

## 2014-09-12 LAB — TROPONIN I

## 2014-09-12 LAB — GLUCOSE, CAPILLARY
GLUCOSE-CAPILLARY: 254 mg/dL — AB (ref 65–99)
Glucose-Capillary: 239 mg/dL — ABNORMAL HIGH (ref 65–99)

## 2014-09-12 LAB — C-PEPTIDE: C PEPTIDE: 1 ng/mL — AB (ref 1.1–4.4)

## 2014-09-12 MED ORDER — ATORVASTATIN CALCIUM 20 MG PO TABS: 20.0000 mg | ORAL_TABLET | Freq: Every day | ORAL | Status: DC

## 2014-09-12 MED ORDER — ASPIRIN 81 MG PO CHEW: 81.0000 mg | CHEWABLE_TABLET | Freq: Every day | ORAL | Status: DC

## 2014-09-12 MED ORDER — POTASSIUM CHLORIDE CRYS ER 20 MEQ PO TBCR
40.0000 meq | EXTENDED_RELEASE_TABLET | Freq: Two times a day (BID) | ORAL | Status: DC
  Administered 2014-09-12: 40 meq via ORAL
  Filled 2014-09-12: qty 2

## 2014-09-12 MED ORDER — SITAGLIPTIN PHOSPHATE 100 MG PO TABS: 100.0000 mg | ORAL_TABLET | Freq: Every day | ORAL | Status: DC

## 2014-09-12 MED ORDER — ASPIRIN 81 MG PO CHEW
81.0000 mg | CHEWABLE_TABLET | Freq: Every day | ORAL | Status: DC
  Administered 2014-09-12: 81 mg via ORAL
  Filled 2014-09-12: qty 1

## 2014-09-12 MED ORDER — METFORMIN HCL 500 MG PO TABS: 500.0000 mg | ORAL_TABLET | Freq: Two times a day (BID) | ORAL | Status: DC

## 2014-09-12 MED ORDER — ACCU-CHEK FASTCLIX LANCETS MISC: Status: DC

## 2014-09-12 MED ORDER — GLUCOSE BLOOD VI STRP: ORAL_STRIP | Status: DC

## 2014-09-12 MED ORDER — ACCU-CHEK NANO SMARTVIEW W/DEVICE KIT: PACK | Status: DC

## 2014-09-12 NOTE — Telephone Encounter (Signed)
Rx sent in.  Katina Degreealeb M. Jimmey RalphParker, MD [facility] Resident PGY-1 09/12/2014 3:19 PM

## 2014-09-12 NOTE — Progress Notes (Signed)
Inpatient Diabetes Program Recommendations  AACE/ADA: New Consensus Statement on Inpatient Glycemic Control (2013)  Target Ranges:  Prepandial:   less than 140 mg/dL      Peak postprandial:   less than 180 mg/dL (1-2 hours)      Critically ill patients:  140 - 180 mg/dL     Results for FENIX, RUPPE (MRN [id number]) as of 09/12/2014 14:12  Ref. Range 09/12/2014 07:44 09/12/2014 11:29  Glucose-Capillary Latest Ref Range: 65-99 mg/dL 254 (H) 239 (H)     Met with patient to discuss any further questions patient may have about her new diagnosis of DM.  Patient stated many people have given her information on DM in the hospital and she feels fairly well prepared to go home.  Patient to go to the [facility] after discharge for follow up and primary care.  Patient was instructed to go to the [facility] clinic directly after d/c to get her Rxs.  Discussed Metformin and Januvia with patient.  Explained what they are, how they work, how to take, side effects, etc.  Also discussed s/sxs of Hypoglycemia with patient and how to treat.   Will follow Cheryl Quaker RN, MSN, CDE Diabetes Coordinator Inpatient Diabetes Program Team Pager: [phone] (8a-5p)

## 2014-09-12 NOTE — Progress Notes (Signed)
Family Medicine Teaching Service Daily Progress Note Intern Pager: [phone]  Patient name: Cheryl Parker Medical record number: [id number] Date of birth: 01-28-1981 Age: 34 y.o. Gender: female  Primary Care Provider: No primary care provider on file. Consultants: None Code Status: Full  Pt Overview and Major Events to Date:  5/17 - Admitted with mild DKA  Assessment and Plan: Cheryl Parker is a 34 y.o. female presenting with mild DKA and epigastric abdominal pain. PMH is significant for gestational diabetes.   # DKA (resolved), new onset diabetes: Admit labs: (Bicarb 16; AG 16; cbg 433). History of gestational diabetes; no PCP f/u in last 3 years. Likely component of dehydration and poor PO intake playing a role in initial lab abnormalities. Patient does not wish to be discharged on insulin.  - CBGs and SSI with meals - Lantus 10U daily, Consider adjusting long acting insulin based on daily SSI needs,and c-peptide levels - Metformin 500mg  bid - Will start Januvia at discharge.  - Can consider adding third antiglycemic agent at follow up.  - A1c 9.9 - Zofran prn - Consult care management for assistance with establishing PCP  - C peptide pending.   # Epigastric abdominal pain: Likely due to DKA and persistent vomiting/retching prior to admission. Can consider GERD but patient has no reflux symptoms. CT abdomen 09/07/14 Negative. Lipase wnl. Improving.  - Continue to monitor  - Start PPI - GI cocktail prn  #HLD. FLP: total cholesterol 212, HDL 28, LDL 159 - Start atorvastatin here.  - Start daily ASA  # Constipation.  - Miralax daily  # Chest Pain: Atypical: worse with movement and palpation. Likely associated with epigastric pain noted above. EKGs stable - Troponin: 0.06, <0.03, 0.03  Disposition: Admitted pending above management.   Subjective:  Able to tolerate eating dinner last night. No further abdominal pain. States that she would prefer to go home on oral  medications, though is open to using insulin in the future.   Objective: Temp:  [98.6 F (37 C)-99.3 F (37.4 C)] 99 F (37.2 C) (05/19 0610) Pulse Rate:  [77-101] 81 (05/19 0610) Resp:  [13-24] 19 (05/19 0610) BP: (115-132)/(65-81) 115/67 mmHg (05/19 0610) SpO2:  [99 %-100 %] 100 % (05/19 0610) Physical Exam: General: NAD, lying in hospital bed Cardiovascular: RRR, no murmurs Respiratory: NWOB, CTAB Abdomen: +BS, NT, ND Extremities: WWP, no cyanosis Neuro: Alert. No focal deficits.   Laboratory/Imaging:  Recent Labs Lab 09/07/14 1242 09/10/14 1034  WBC 14.6* 14.2*  HGB 15.4* 16.3*  HCT 44.6 47.4*  PLT 400 409*    Recent Labs Lab 09/10/14 1034  09/11/14 0536 09/11/14 1626 09/12/14 0347  NA 131*  < > 134* 132* 136  K 4.3  < > 3.6 3.3* 3.2*  CL 99*  < > 107 101 103  CO2 16*  < > 21* 23 25  BUN 10  < > 10 10 8   CREATININE 0.91  < > 0.64 0.57 0.60  CALCIUM 9.8  < > 8.6* 8.4* 8.5*  PROT 7.8  --   --   --   --   BILITOT 1.5*  --   --   --   --   ALKPHOS 106  --   --   --   --   ALT 31  --   --   --   --   AST 12*  --   --   --   --   GLUCOSE 428*  < > 189* 239* 164*  < > =  values in this interval not displayed.  Recent Labs Lab 09/11/14 0806 09/11/14 1210 09/11/14 1712 09/11/14 2112 09/12/14 0744  GLUCAP 210* 217* 222* 162* 254*      Recent Labs Lab 09/11/14 0329 09/11/14 1028 09/11/14 1626 09/11/14 2120 09/12/14 0347  TROPONINI <0.03 0.05* <0.03 <0.03 <0.03    Ardith Darkaleb M Enis Riecke, MD 09/12/2014, 8:15 AM PGY-1, [facility] FPTS Intern pager: [phone], text pages welcome

## 2014-09-12 NOTE — Progress Notes (Signed)
Reviewed discharge paperwork with pt and prescriptions give.  Pt denied any other needs at this time.  PIV removed.  Escorted pt to discharge location.

## 2014-09-12 NOTE — Discharge Instructions (Signed)
You were admitted to the hospital with diabetes ketoacidosis, also called DKA. This occurs when your body is not able to process glucose properly. We started you on two oral medications for your diabetes here, metformin and Januvia. You may experience some diarrhea with the metformin for 1-2 weeks, but this should go away. We also started you on a small dose of aspirin daily and a cholesterol medication. It is very important for [facility] for [facility] management of your diabetes.

## 2014-09-12 NOTE — Plan of Care (Signed)
Problem: Consults Goal: Diagnosis-Diabetes Mellitus Outcome: Completed/Met Date Met:  09/12/14 New Onset Type II

## 2014-09-12 NOTE — Progress Notes (Signed)
Pt given pamphlet on [facility], follow up appointment made and added to [facility]. Pt can receive medications there.

## 2014-09-18 ENCOUNTER — Inpatient Hospital Stay: Payer: Medicaid Other | Admitting: Family Medicine

## 2014-11-15 ENCOUNTER — Encounter (HOSPITAL_COMMUNITY): Payer: Self-pay | Admitting: Emergency Medicine

## 2014-11-15 ENCOUNTER — Inpatient Hospital Stay (HOSPITAL_COMMUNITY)
Admission: EM | Admit: 2014-11-15 | Discharge: 2014-11-19 | DRG: 781 | Disposition: A | Discharge status: Home / Self Care | Payer: Medicaid Other | Attending: Obstetrics and Gynecology | Admitting: Obstetrics and Gynecology

## 2014-11-15 DIAGNOSIS — E876 Hypokalemia: Secondary | ICD-10-CM | POA: Diagnosis present

## 2014-11-15 DIAGNOSIS — E860 Dehydration: Secondary | ICD-10-CM | POA: Diagnosis present

## 2014-11-15 DIAGNOSIS — Z794 Long term (current) use of insulin: Secondary | ICD-10-CM

## 2014-11-15 DIAGNOSIS — K2970 Gastritis, unspecified, without bleeding: Secondary | ICD-10-CM

## 2014-11-15 DIAGNOSIS — O26899 Other specified pregnancy related conditions, unspecified trimester: Secondary | ICD-10-CM

## 2014-11-15 DIAGNOSIS — D72829 Elevated white blood cell count, unspecified: Secondary | ICD-10-CM | POA: Diagnosis present

## 2014-11-15 DIAGNOSIS — E785 Hyperlipidemia, unspecified: Secondary | ICD-10-CM | POA: Diagnosis present

## 2014-11-15 DIAGNOSIS — O210 Mild hyperemesis gravidarum: Secondary | ICD-10-CM | POA: Diagnosis present

## 2014-11-15 DIAGNOSIS — R1110 Vomiting, unspecified: Secondary | ICD-10-CM

## 2014-11-15 DIAGNOSIS — O211 Hyperemesis gravidarum with metabolic disturbance: Secondary | ICD-10-CM | POA: Diagnosis present

## 2014-11-15 DIAGNOSIS — Z3A01 Less than 8 weeks gestation of pregnancy: Secondary | ICD-10-CM | POA: Diagnosis present

## 2014-11-15 DIAGNOSIS — R1010 Upper abdominal pain, unspecified: Secondary | ICD-10-CM

## 2014-11-15 DIAGNOSIS — O24111 Pre-existing diabetes mellitus, type 2, in pregnancy, first trimester: Principal | ICD-10-CM | POA: Diagnosis present

## 2014-11-15 DIAGNOSIS — R109 Unspecified abdominal pain: Secondary | ICD-10-CM

## 2014-11-15 DIAGNOSIS — E1165 Type 2 diabetes mellitus with hyperglycemia: Secondary | ICD-10-CM | POA: Diagnosis present

## 2014-11-15 DIAGNOSIS — E871 Hypo-osmolality and hyponatremia: Secondary | ICD-10-CM

## 2014-11-15 DIAGNOSIS — Z833 Family history of diabetes mellitus: Secondary | ICD-10-CM

## 2014-11-15 DIAGNOSIS — K3184 Gastroparesis: Secondary | ICD-10-CM | POA: Diagnosis present

## 2014-11-15 LAB — BASIC METABOLIC PANEL
Anion gap: 11 (ref 5–15)
BUN: 7 mg/dL (ref 6–20)
CO2: 19 mmol/L — ABNORMAL LOW (ref 22–32)
Calcium: 9.6 mg/dL (ref 8.9–10.3)
Chloride: 103 mmol/L (ref 101–111)
Creatinine, Ser: 0.63 mg/dL (ref 0.44–1.00)
GFR calc non Af Amer: >60 mL/min (ref >60)
Glucose, Bld: 176 mg/dL — ABNORMAL HIGH (ref 65–99)
Potassium: 3.3 mmol/L — ABNORMAL LOW (ref 3.5–5.1)
SODIUM: 133 mmol/L — AB (ref 135–145)

## 2014-11-15 LAB — CBC
HEMATOCRIT: 39.5 % (ref 36.0–46.0)
Hemoglobin: 13.7 g/dL (ref 12.0–15.0)
MCH: 31.8 pg (ref 26.0–34.0)
MCHC: 34.7 g/dL (ref 30.0–36.0)
MCV: 91.6 fL (ref 78.0–100.0)
PLATELETS: 425 10*3/uL — AB (ref 150–400)
RBC: 4.31 MIL/uL (ref 3.87–5.11)
RDW: 12.1 % (ref 11.5–15.5)
WBC: 17.4 10*3/uL — AB (ref 4.0–10.5)

## 2014-11-15 LAB — CBG MONITORING, ED: Glucose-Capillary: 159 mg/dL — ABNORMAL HIGH (ref 65–99)

## 2014-11-15 MED ORDER — SODIUM CHLORIDE 0.9 % IV BOLUS (SEPSIS)
1000.0000 mL | Freq: Once | INTRAVENOUS | Status: AC
  Administered 2014-11-15: 1000 mL via INTRAVENOUS

## 2014-11-15 MED ORDER — PANTOPRAZOLE SODIUM 40 MG IV SOLR
40.0000 mg | Freq: Once | INTRAVENOUS | Status: AC
  Administered 2014-11-15: 40 mg via INTRAVENOUS
  Filled 2014-11-15: qty 40

## 2014-11-15 MED ORDER — GI COCKTAIL ~~LOC~~
30.0000 mL | Freq: Once | ORAL | Status: AC
  Administered 2014-11-15: 30 mL via ORAL
  Filled 2014-11-15: qty 30

## 2014-11-15 MED ORDER — ONDANSETRON HCL 4 MG/2ML IJ SOLN
4.0000 mg | Freq: Once | INTRAMUSCULAR | Status: AC
  Administered 2014-11-15: 4 mg via INTRAVENOUS
  Filled 2014-11-15: qty 2

## 2014-11-15 NOTE — ED Notes (Signed)
Patient is a Type II diabetic, has been out of her meds for over a couple of weeks.  Patient with labored breathing, nausea and vomiting.  Patient has also been out of strips and has not been checking her sugar.

## 2014-11-15 NOTE — ED Notes (Signed)
Dr. Yelverton at the bedside.  

## 2014-11-15 NOTE — ED Provider Notes (Signed)
CSN: [id number]     Arrival date & time 11/15/14  2249 History  This chart was scribed for Julianne Rice, MD by Chester Holstein, ED Scribe. This patient was seen in room A13C/A13C and the patient's care was started at 11:24 PM.    Chief Complaint  Patient presents with  . Hyperglycemia     The history is provided by the patient. No language interpreter was used.   HPI Comments: Cheryl Parker is a 34 y.o. female with PMHx of HTN, DM, and DKA who presents to the Emergency Department complaining of nausea and vomiting with onset 2-3 hours ago. Pt reports recent sharp, burning upper abdominal pain, worsening today and radiating into back. She notes associated SOB. She attempted over-the-counter herbal therapy with no relief. She reports movement and walking worsens the pain. She reports she is unable to tolerate food intake but denies changes in pain with eating. She states she took Aleve frequently for 1 month prior to abdominal pain which began after she stopped taking it. Pt was seen in ED on 09/10/14 for epigastric pain with admission. She completed the course of Rx given at d/c 2 weeks ago. She did not f/u with a primary physician as instructed. Denies diarrhea and fever.   Past Medical History  Diagnosis Date  . Hypertension   . Pregnancy induced hypertension   . 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 Gestational diabetes   . DKA (diabetic ketoacidoses) 09/10/2014   Past Surgical History  Procedure Laterality Date  . Cesarean section    . Cesarean section N/A 07/05/2012    Procedure: CESAREAN SECTION;  Surgeon: Mora Bellman, MD;  Location: [facility];  Service: Obstetrics;  Laterality: N/A;   Family History  Problem Relation Age of Onset  . Diabetes Mother    History  Substance Use Topics  . Smoking status: Never Smoker   . Smokeless tobacco: Never Used  . Alcohol Use: No   OB History    Gravida Para Term Preterm AB TAB SAB Ectopic Multiple Living    '2 2 2       2     ' Review of Systems  Constitutional: Negative for fever and chills.  Respiratory: Positive for shortness of breath. Negative for cough.   Cardiovascular: Negative for chest pain, palpitations and leg swelling.  Gastrointestinal: Positive for nausea, vomiting and abdominal pain. Negative for diarrhea and constipation.  Musculoskeletal: Negative for back pain, neck pain and neck stiffness.  Skin: Negative for rash and wound.  Neurological: Negative for dizziness, weakness, light-headedness, numbness and headaches.  All other systems reviewed and are negative.     Allergies  Review of patient's allergies indicates no known allergies.  Home Medications   Prior to Admission medications   Medication Sig Start Date End Date Taking? Authorizing Provider  ACCU-CHEK FASTCLIX LANCETS MISC Use 1-4 times daily as needed to check blood glucose. 09/12/14  Yes Vivi Barrack, MD  acetaminophen (TYLENOL) 325 MG tablet Take 650 mg by mouth every 6 (six) hours as needed for mild pain.   Yes Historical Provider, MD  aspirin 81 MG chewable tablet Chew 1 tablet (81 mg total) by mouth daily. 09/12/14  Yes Vivi Barrack, MD  atorvastatin (LIPITOR) 20 MG tablet TAKE 1 TABLET BY MOUTH DAILY AT 6 PM 09/12/14  Yes Vivi Barrack, MD  Blood Glucose Monitoring Suppl (ACCU-CHEK NANO SMARTVIEW) W/DEVICE KIT Used daily as needed to check blood glucose 09/12/14  Yes Caleb  Burnetta Sabin, MD  glucose blood (ACCU-CHEK SMARTVIEW) test strip Use 1-4 times daily as needed to check blood glucose. 09/12/14  Yes Vivi Barrack, MD  JANUVIA 100 MG tablet TAKE 1 TABLET BY MOUTH DAILY 09/12/14  Yes Vivi Barrack, MD  metFORMIN (GLUCOPHAGE) 500 MG tablet Take 1 tablet (500 mg total) by mouth 2 (two) times daily with a meal. 09/12/14  Yes Vivi Barrack, MD  traMADol (ULTRAM) 50 MG tablet Take 1 tablet (50 mg total) by mouth every 6 (six) hours as needed. Patient taking differently: Take 50 mg by mouth every 6 (six) hours as  needed for moderate pain.  09/07/14  Yes Wandra Arthurs, MD  esomeprazole (NEXIUM) 40 MG capsule Take 1 capsule (40 mg total) by mouth daily. 11/16/14   Julianne Rice, MD  ondansetron (ZOFRAN ODT) 4 MG disintegrating tablet 47m ODT q4 hours prn nausea/vomit 11/16/14   DJulianne Rice MD   BP 133/82 mmHg  Pulse 109  Temp(Src) 97.6 F (36.4 C) (Oral)  Resp 18  Wt 159 lb (72.122 kg)  SpO2 100% Physical Exam  Constitutional: She is oriented to person, place, and time. She appears well-developed and well-nourished. No distress.  HENT:  Head: Normocephalic and atraumatic.  Mouth/Throat: Oropharynx is clear and moist.  Eyes: EOM are normal. Pupils are equal, round, and reactive to light.  Neck: Normal range of motion. Neck supple.  Cardiovascular: Normal rate and regular rhythm.   Pulmonary/Chest: Effort normal and breath sounds normal. No respiratory distress. She has no wheezes. She has no rales. She exhibits no tenderness.  Abdominal: Soft. Bowel sounds are normal. She exhibits no distension and no mass. There is no tenderness. There is no rebound and no guarding.  Musculoskeletal: Normal range of motion. She exhibits no edema or tenderness.  No CVA tenderness bilaterally.  Neurological: She is alert and oriented to person, place, and time.  Skin: Skin is warm and dry. No rash noted. No erythema.  Psychiatric: She has a normal mood and affect. Her behavior is normal.  Nursing note and vitals reviewed.   ED Course  Procedures (including critical care time) DIAGNOSTIC STUDIES: Oxygen Saturation is 100% on room air, normal by my interpretation.    COORDINATION OF CARE: 11:30 PM Discussed treatment plan with patient at beside, the patient agrees with the plan and has no further questions at this time.   Labs Review Labs Reviewed  BASIC METABOLIC PANEL - Abnormal; Notable for the following:    Sodium 133 (*)    Potassium 3.3 (*)    CO2 19 (*)    Glucose, Bld 176 (*)    All other  components within normal limits  CBC - Abnormal; Notable for the following:    WBC 17.4 (*)    Platelets 425 (*)    All other components within normal limits  HEPATIC FUNCTION PANEL - Abnormal; Notable for the following:    Bilirubin, Direct <0.1 (*)    All other components within normal limits  URINALYSIS, ROUTINE W REFLEX MICROSCOPIC [facility] - Abnormal; Notable for the following:    Glucose, UA >1000 (*)    Ketones, ur >80 (*)    All other components within normal limits  URINE MICROSCOPIC-ADD ON - Abnormal; Notable for the following:    Squamous Epithelial / LPF FEW (*)    All other components within normal limits  CBG MONITORING, ED - Abnormal; Notable for the following:    Glucose-Capillary 159 (*)  All other components within normal limits  POC URINE PREG, ED - Abnormal; Notable for the following:    Preg Test, Ur POSITIVE (*)    All other components within normal limits  CBG MONITORING, ED - Abnormal; Notable for the following:    Glucose-Capillary 219 (*)    All other components within normal limits  LIPASE, BLOOD  HEMOGLOBIN A1C  HCG, SERUM, QUALITATIVE    Imaging Review US Abdomen Complete  11/16/2014   CLINICAL DATA:  35 year old female with upper abdominal pain and vomiting  EXAM: ULTRASOUND ABDOMEN COMPLETE  COMPARISON:  CT dated 09/07/2014  FINDINGS: Gallbladder: No gallstones or wall thickening visualized. No sonographic Murphy sign noted.  Common bile duct: Diameter: 4 mm  Liver: No focal lesion identified. Within normal limits in parenchymal echogenicity.  IVC: No abnormality visualized.  Pancreas: Visualized portion unremarkable.  Spleen: Size and appearance within normal limits.  Right Kidney: Length: 12 cm. Echogenicity within normal limits. No mass or hydronephrosis visualized.  Left Kidney: Length: 12 cm. Echogenicity within normal limits. No mass or hydronephrosis visualized.  Abdominal aorta: No aneurysm visualized.  Other findings: None.  IMPRESSION:  Unremarkable abdominal ultrasound.   Electronically Signed   By: Anner Crete M.D.   On: 11/16/2014 02:04     EKG Interpretation None      MDM   Final diagnoses:  Upper abdominal pain  Gastritis  Intractable vomiting with nausea, vomiting of unspecified type    I personally performed the services described in this documentation, which was scribed in my presence. The recorded information has been reviewed and is accurate.  Patient with persistent vomiting despite multiple medication dosing. She also has persistent tachycardia despite several liters of IV fluids. The patient's symptoms likely due to hyperemesis gravidarum and gastritis. Discussed with [facility] hospitalist and will admit to observation MedSurg bed.    Julianne Rice, MD 11/16/14 [phone]

## 2014-11-15 NOTE — ED Notes (Signed)
Patient currently attempting to void via bedside commode.

## 2014-11-16 ENCOUNTER — Inpatient Hospital Stay (HOSPITAL_COMMUNITY): Payer: Medicaid Other

## 2014-11-16 ENCOUNTER — Encounter (HOSPITAL_COMMUNITY): Payer: Self-pay | Admitting: *Deleted

## 2014-11-16 ENCOUNTER — Emergency Department (HOSPITAL_COMMUNITY): Payer: Medicaid Other

## 2014-11-16 DIAGNOSIS — D72829 Elevated white blood cell count, unspecified: Secondary | ICD-10-CM

## 2014-11-16 DIAGNOSIS — R1110 Vomiting, unspecified: Secondary | ICD-10-CM | POA: Insufficient documentation

## 2014-11-16 DIAGNOSIS — O211 Hyperemesis gravidarum with metabolic disturbance: Secondary | ICD-10-CM | POA: Diagnosis present

## 2014-11-16 DIAGNOSIS — O210 Mild hyperemesis gravidarum: Secondary | ICD-10-CM | POA: Diagnosis not present

## 2014-11-16 DIAGNOSIS — E876 Hypokalemia: Secondary | ICD-10-CM | POA: Diagnosis present

## 2014-11-16 DIAGNOSIS — E785 Hyperlipidemia, unspecified: Secondary | ICD-10-CM | POA: Diagnosis present

## 2014-11-16 DIAGNOSIS — Z833 Family history of diabetes mellitus: Secondary | ICD-10-CM | POA: Diagnosis not present

## 2014-11-16 DIAGNOSIS — E871 Hypo-osmolality and hyponatremia: Secondary | ICD-10-CM

## 2014-11-16 DIAGNOSIS — Z794 Long term (current) use of insulin: Secondary | ICD-10-CM | POA: Diagnosis not present

## 2014-11-16 DIAGNOSIS — Z3A01 Less than 8 weeks gestation of pregnancy: Secondary | ICD-10-CM | POA: Diagnosis present

## 2014-11-16 DIAGNOSIS — K3184 Gastroparesis: Secondary | ICD-10-CM | POA: Diagnosis present

## 2014-11-16 DIAGNOSIS — E1165 Type 2 diabetes mellitus with hyperglycemia: Secondary | ICD-10-CM

## 2014-11-16 DIAGNOSIS — O24111 Pre-existing diabetes mellitus, type 2, in pregnancy, first trimester: Secondary | ICD-10-CM | POA: Diagnosis not present

## 2014-11-16 LAB — URINALYSIS, ROUTINE W REFLEX MICROSCOPIC
Bilirubin Urine: NEGATIVE
Hgb urine dipstick: NEGATIVE
Ketones, ur: >80 mg/dL — AB
Leukocytes, UA: NEGATIVE
Nitrite: NEGATIVE
Protein, ur: NEGATIVE mg/dL
SPECIFIC GRAVITY, URINE: 1.021 (ref 1.005–1.030)
UROBILINOGEN UA: 0.2 mg/dL (ref 0.0–1.0)
pH: 5 (ref 5.0–8.0)

## 2014-11-16 LAB — HEPATIC FUNCTION PANEL
ALT: 33 U/L (ref 14–54)
AST: 18 U/L (ref 15–41)
Albumin: 4.2 g/dL (ref 3.5–5.0)
Alkaline Phosphatase: 79 U/L (ref 38–126)
Bilirubin, Direct: <0.1 mg/dL — ABNORMAL LOW (ref 0.1–0.5)
Total Bilirubin: 0.6 mg/dL (ref 0.3–1.2)
Total Protein: 7.3 g/dL (ref 6.5–8.1)

## 2014-11-16 LAB — HCG, SERUM, QUALITATIVE: PREG SERUM: POSITIVE — AB

## 2014-11-16 LAB — URINE MICROSCOPIC-ADD ON

## 2014-11-16 LAB — CBG MONITORING, ED: Glucose-Capillary: 219 mg/dL — ABNORMAL HIGH (ref 65–99)

## 2014-11-16 LAB — GLUCOSE, CAPILLARY
GLUCOSE-CAPILLARY: 201 mg/dL — AB (ref 65–99)
GLUCOSE-CAPILLARY: 180 mg/dL — AB (ref 65–99)
Glucose-Capillary: 159 mg/dL — ABNORMAL HIGH (ref 65–99)
Glucose-Capillary: 184 mg/dL — ABNORMAL HIGH (ref 65–99)

## 2014-11-16 LAB — LIPASE, BLOOD: Lipase: 36 U/L (ref 22–51)

## 2014-11-16 LAB — TSH: TSH: 0.509 u[IU]/mL (ref 0.350–4.500)

## 2014-11-16 LAB — MAGNESIUM: MAGNESIUM: 1.7 mg/dL (ref 1.7–2.4)

## 2014-11-16 LAB — POC URINE PREG, ED: Preg Test, Ur: POSITIVE — AB

## 2014-11-16 MED ORDER — MORPHINE SULFATE 2 MG/ML IJ SOLN
0.5000 mg | INTRAMUSCULAR | Status: DC | PRN
  Administered 2014-11-16 (×2): 1 mg via INTRAVENOUS
  Administered 2014-11-16: 0.5 mg via INTRAVENOUS
  Administered 2014-11-16 – 2014-11-17 (×7): 1 mg via INTRAVENOUS
  Filled 2014-11-16 (×9): qty 1

## 2014-11-16 MED ORDER — ONDANSETRON HCL 4 MG/2ML IJ SOLN
4.0000 mg | Freq: Once | INTRAMUSCULAR | Status: AC
  Administered 2014-11-16: 4 mg via INTRAVENOUS
  Filled 2014-11-16: qty 2

## 2014-11-16 MED ORDER — ENOXAPARIN SODIUM 40 MG/0.4ML ~~LOC~~ SOLN
40.0000 mg | SUBCUTANEOUS | Status: DC
  Administered 2014-11-16 – 2014-11-17 (×2): 40 mg via SUBCUTANEOUS
  Filled 2014-11-16 (×3): qty 0.4

## 2014-11-16 MED ORDER — ONDANSETRON 4 MG PO TBDP: ORAL_TABLET | ORAL | Status: DC

## 2014-11-16 MED ORDER — BOOST / RESOURCE BREEZE PO LIQD
1.0000 | Freq: Three times a day (TID) | ORAL | Status: DC
  Administered 2014-11-17 – 2014-11-18 (×3): 1 via ORAL
  Filled 2014-11-16 (×4): qty 1

## 2014-11-16 MED ORDER — FOLIC ACID 5 MG/ML IJ SOLN
1.0000 mg | Freq: Every day | INTRAMUSCULAR | Status: DC
  Administered 2014-11-16 – 2014-11-17 (×2): 1 mg via INTRAVENOUS
  Filled 2014-11-16 (×3): qty 0.2

## 2014-11-16 MED ORDER — METOCLOPRAMIDE HCL 5 MG/ML IJ SOLN
5.0000 mg | Freq: Three times a day (TID) | INTRAMUSCULAR | Status: DC
  Administered 2014-11-16 – 2014-11-18 (×9): 5 mg via INTRAVENOUS
  Filled 2014-11-16: qty 1
  Filled 2014-11-16 (×2): qty 2
  Filled 2014-11-16: qty 1
  Filled 2014-11-16: qty 2
  Filled 2014-11-16 (×2): qty 1
  Filled 2014-11-16 (×5): qty 2

## 2014-11-16 MED ORDER — POTASSIUM CHLORIDE IN NACL 20-0.9 MEQ/L-% IV SOLN
INTRAVENOUS | Status: DC
  Administered 2014-11-16 – 2014-11-17 (×3): via INTRAVENOUS
  Filled 2014-11-16 (×7): qty 1000

## 2014-11-16 MED ORDER — ESOMEPRAZOLE MAGNESIUM 40 MG PO CPDR: 40.0000 mg | DELAYED_RELEASE_CAPSULE | Freq: Every day | ORAL | Status: DC

## 2014-11-16 MED ORDER — ACETAMINOPHEN 325 MG PO TABS
650.0000 mg | ORAL_TABLET | Freq: Four times a day (QID) | ORAL | Status: DC | PRN
  Administered 2014-11-16: 650 mg via ORAL
  Filled 2014-11-16: qty 2

## 2014-11-16 MED ORDER — ACETAMINOPHEN 650 MG RE SUPP: 650.0000 mg | Freq: Four times a day (QID) | RECTAL | Status: DC | PRN

## 2014-11-16 MED ORDER — SODIUM CHLORIDE 0.9 % IV SOLN
INTRAVENOUS | Status: DC
  Administered 2014-11-16: 07:00:00 via INTRAVENOUS

## 2014-11-16 MED ORDER — MORPHINE SULFATE 2 MG/ML IJ SOLN
2.0000 mg | Freq: Once | INTRAMUSCULAR | Status: AC
  Administered 2014-11-16: 2 mg via INTRAVENOUS
  Filled 2014-11-16: qty 1

## 2014-11-16 MED ORDER — ALUM & MAG HYDROXIDE-SIMETH 200-200-20 MG/5ML PO SUSP
30.0000 mL | Freq: Four times a day (QID) | ORAL | Status: DC | PRN
  Administered 2014-11-18 (×2): 30 mL via ORAL
  Filled 2014-11-16 (×2): qty 30

## 2014-11-16 MED ORDER — CHLORHEXIDINE GLUCONATE 0.12 % MT SOLN
15.0000 mL | Freq: Two times a day (BID) | OROMUCOSAL | Status: DC
  Administered 2014-11-16 – 2014-11-17 (×2): 15 mL via OROMUCOSAL
  Filled 2014-11-16 (×4): qty 15

## 2014-11-16 MED ORDER — SODIUM CHLORIDE 0.9 % IV SOLN: INTRAVENOUS | Status: DC

## 2014-11-16 MED ORDER — SODIUM CHLORIDE 0.9 % IV BOLUS (SEPSIS)
1000.0000 mL | Freq: Once | INTRAVENOUS | Status: AC
  Administered 2014-11-16: 1000 mL via INTRAVENOUS

## 2014-11-16 MED ORDER — INSULIN ASPART 100 UNIT/ML ~~LOC~~ SOLN
0.0000 [IU] | SUBCUTANEOUS | Status: DC
  Administered 2014-11-16 (×2): 3 [IU] via SUBCUTANEOUS
  Administered 2014-11-16: 5 [IU] via SUBCUTANEOUS
  Administered 2014-11-16: 3 [IU] via SUBCUTANEOUS
  Administered 2014-11-17: 2 [IU] via SUBCUTANEOUS
  Administered 2014-11-17: 5 [IU] via SUBCUTANEOUS
  Administered 2014-11-17 (×4): 2 [IU] via SUBCUTANEOUS
  Administered 2014-11-18: 3 [IU] via SUBCUTANEOUS
  Administered 2014-11-18 (×2): 2 [IU] via SUBCUTANEOUS
  Administered 2014-11-18: 3 [IU] via SUBCUTANEOUS
  Administered 2014-11-18: 2 [IU] via SUBCUTANEOUS

## 2014-11-16 MED ORDER — THIAMINE HCL 100 MG/ML IJ SOLN
100.0000 mg | Freq: Every day | INTRAMUSCULAR | Status: DC
  Administered 2014-11-16 – 2014-11-17 (×3): 100 mg via INTRAVENOUS
  Filled 2014-11-16: qty 1
  Filled 2014-11-16: qty 2
  Filled 2014-11-16 (×2): qty 1
  Filled 2014-11-16: qty 2

## 2014-11-16 MED ORDER — ONDANSETRON HCL 4 MG/2ML IJ SOLN
4.0000 mg | Freq: Three times a day (TID) | INTRAMUSCULAR | Status: AC | PRN
  Filled 2014-11-16 (×2): qty 2

## 2014-11-16 MED ORDER — FAMOTIDINE IN NACL 20-0.9 MG/50ML-% IV SOLN
20.0000 mg | Freq: Two times a day (BID) | INTRAVENOUS | Status: DC
  Administered 2014-11-16 – 2014-11-18 (×6): 20 mg via INTRAVENOUS
  Filled 2014-11-16 (×12): qty 50

## 2014-11-16 MED ORDER — SODIUM CHLORIDE 0.9 % IJ SOLN
3.0000 mL | Freq: Two times a day (BID) | INTRAMUSCULAR | Status: DC
  Administered 2014-11-16 – 2014-11-17 (×2): 3 mL via INTRAVENOUS

## 2014-11-16 MED ORDER — CETYLPYRIDINIUM CHLORIDE 0.05 % MT LIQD
7.0000 mL | Freq: Two times a day (BID) | OROMUCOSAL | Status: DC
  Administered 2014-11-16 – 2014-11-17 (×2): 7 mL via OROMUCOSAL

## 2014-11-16 MED ORDER — SODIUM CHLORIDE 0.9 % IV SOLN: 8.0000 mg | Freq: Once | INTRAVENOUS | Status: DC

## 2014-11-16 MED ORDER — ONDANSETRON HCL 4 MG/2ML IJ SOLN
4.0000 mg | Freq: Three times a day (TID) | INTRAMUSCULAR | Status: DC
  Administered 2014-11-16 – 2014-11-17 (×5): 4 mg via INTRAVENOUS
  Filled 2014-11-16 (×4): qty 2

## 2014-11-16 MED ORDER — INSULIN GLARGINE 100 UNIT/ML ~~LOC~~ SOLN
5.0000 [IU] | Freq: Every day | SUBCUTANEOUS | Status: DC
  Administered 2014-11-16 – 2014-11-17 (×2): 5 [IU] via SUBCUTANEOUS
  Filled 2014-11-16 (×2): qty 0.05

## 2014-11-16 NOTE — ED Notes (Signed)
cbg is 219 

## 2014-11-16 NOTE — Progress Notes (Signed)
Called for report from ED.  RN will call back.  [employer] RN-BC, WTA.

## 2014-11-16 NOTE — ED Notes (Signed)
Attempted to call report

## 2014-11-16 NOTE — ED Notes (Signed)
Called ultrasound, urine sample on file, no hcg needed prior to travel to ultrasound.

## 2014-11-16 NOTE — ED Notes (Signed)
Dr. Ranae Palms aware of cbg 219. Planning for admission.

## 2014-11-16 NOTE — Progress Notes (Signed)
RN called- pt with continued emesis and dry heaves and complains of severe epigastric pain that is worse with movement- no blood in emesis. Pain not relieved by IV Morphine. Review in Up-To-Date says cautious use of NSAIDs during 1st trimester so am reluctant to give. Will attempt to control emesis by adding IV Reglan- documented as safe to use during pregnancy. Will use heating pad to treat CW pain. Will like to avoid increasing IV Narc use since could be contributing to N/V. If we are unable to mange her sx's she may be better served at [facility] under the care of an OB.  Junious Silk, ANP

## 2014-11-16 NOTE — Discharge Instructions (Signed)
Gastritis, Adult °Gastritis is soreness and swelling (inflammation) of the lining of the stomach. Gastritis can develop as a sudden onset (acute) or long-term (chronic) condition. If gastritis is not treated, it can lead to stomach bleeding and ulcers. °CAUSES  °Gastritis occurs when the stomach lining is weak or damaged. Digestive juices from the stomach then inflame the weakened stomach lining. The stomach lining may be weak or damaged due to viral or bacterial infections. One common bacterial infection is the Helicobacter pylori infection. Gastritis can also result from excessive alcohol consumption, taking certain medicines, or having too much acid in the stomach.  °SYMPTOMS  °In some cases, there are no symptoms. When symptoms are present, they may include: °· Pain or a burning sensation in the upper abdomen. °· Nausea. °· Vomiting. °· An uncomfortable feeling of fullness after eating. °DIAGNOSIS  °Your caregiver may suspect you have gastritis based on your symptoms and a physical exam. To determine the cause of your gastritis, your caregiver may perform the following: °· Blood or stool tests to check for the H pylori bacterium. °· Gastroscopy. A thin, flexible tube (endoscope) is passed down the esophagus and into the stomach. The endoscope has a light and camera on the end. Your caregiver uses the endoscope to view the inside of the stomach. °· Taking a tissue sample (biopsy) from the stomach to examine under a microscope. °TREATMENT  °Depending on the cause of your gastritis, medicines may be prescribed. If you have a bacterial infection, such as an H pylori infection, antibiotics may be given. If your gastritis is caused by too much acid in the stomach, H2 blockers or antacids may be given. Your caregiver may recommend that you stop taking aspirin, ibuprofen, or other nonsteroidal anti-inflammatory drugs (NSAIDs). °HOME CARE INSTRUCTIONS °· Only take over-the-counter or prescription medicines as directed by  your caregiver. °· If you were given antibiotic medicines, take them as directed. Finish them even if you start to feel better. °· Drink enough fluids to keep your urine clear or pale yellow. °· Avoid foods and drinks that make your symptoms worse, such as: °¨ Caffeine or alcoholic drinks. °¨ Chocolate. °¨ Peppermint or mint flavorings. °¨ Garlic and onions. °¨ Spicy foods. °¨ Citrus fruits, such as oranges, lemons, or limes. °¨ Tomato-based foods such as sauce, chili, salsa, and pizza. °¨ Fried and fatty foods. °· Eat small, frequent meals instead of large meals. °SEEK IMMEDIATE MEDICAL CARE IF:  °· You have black or dark red stools. °· You vomit blood or material that looks like coffee grounds. °· You are unable to keep fluids down. °· Your abdominal pain gets worse. °· You have a fever. °· You do not feel better after 1 week. °· You have any other questions or concerns. °MAKE SURE YOU: °· Understand these instructions. °· Will watch your condition. °· Will get help right away if you are not doing well or get worse. °Document Released: 04/06/2001 Document Revised: 10/12/2011 Document Reviewed: 05/26/2011 °ExitCare® Patient Information ©2015 [employer]. This information is not intended to replace advice given to you by your health care provider. Make sure you discuss any questions you have with your health care provider. ° °Nausea and Vomiting °Nausea is a sick feeling that often comes before throwing up (vomiting). Vomiting is a reflex where stomach contents come out of your mouth. Vomiting can cause severe loss of body fluids (dehydration). Children and elderly adults can become dehydrated quickly, especially if they also have diarrhea. Nausea and vomiting are   symptoms of a condition or disease. It is important to find the cause of your symptoms. °CAUSES  °· Direct irritation of the stomach lining. This irritation can result from increased acid production (gastroesophageal reflux disease), infection, food  poisoning, taking certain medicines (such as nonsteroidal anti-inflammatory drugs), alcohol use, or tobacco use. °· Signals from the brain. These signals could be caused by a headache, heat exposure, an inner ear disturbance, increased pressure in the brain from injury, infection, a tumor, or a concussion, pain, emotional stimulus, or metabolic problems. °· An obstruction in the gastrointestinal tract (bowel obstruction). °· Illnesses such as diabetes, hepatitis, gallbladder problems, appendicitis, kidney problems, cancer, sepsis, atypical symptoms of a heart attack, or eating disorders. °· Medical treatments such as chemotherapy and radiation. °· Receiving medicine that makes you sleep (general anesthetic) during surgery. °DIAGNOSIS °Your caregiver may ask for tests to be done if the problems do not improve after a few days. Tests may also be done if symptoms are severe or if the reason for the nausea and vomiting is not clear. Tests may include: °· Urine tests. °· Blood tests. °· Stool tests. °· Cultures (to look for evidence of infection). °· X-rays or other imaging studies. °Test results can help your caregiver make decisions about treatment or the need for additional tests. °TREATMENT °You need to stay well hydrated. Drink frequently but in small amounts. You may wish to drink water, sports drinks, clear broth, or eat frozen ice pops or gelatin dessert to help stay hydrated. When you eat, eating slowly may help prevent nausea. There are also some antinausea medicines that may help prevent nausea. °HOME CARE INSTRUCTIONS  °· Take all medicine as directed by your caregiver. °· If you do not have an appetite, do not force yourself to eat. However, you must continue to drink fluids. °· If you have an appetite, eat a normal diet unless your caregiver tells you differently. °¨ Eat a variety of complex carbohydrates (rice, wheat, potatoes, bread), lean meats, yogurt, fruits, and vegetables. °¨ Avoid high-fat foods  because they are more difficult to digest. °· Drink enough water and fluids to keep your urine clear or pale yellow. °· If you are dehydrated, ask your caregiver for specific rehydration instructions. Signs of dehydration may include: °¨ Severe thirst. °¨ Dry lips and mouth. °¨ Dizziness. °¨ Dark urine. °¨ Decreasing urine frequency and amount. °¨ Confusion. °¨ Rapid breathing or pulse. °SEEK IMMEDIATE MEDICAL CARE IF:  °· You have blood or brown flecks (like coffee grounds) in your vomit. °· You have black or bloody stools. °· You have a severe headache or stiff neck. °· You are confused. °· You have severe abdominal pain. °· You have chest pain or trouble breathing. °· You do not urinate at least once every 8 hours. °· You develop cold or clammy skin. °· You continue to vomit for longer than 24 to 48 hours. °· You have a fever. °MAKE SURE YOU:  °· Understand these instructions. °· Will watch your condition. °· Will get help right away if you are not doing well or get worse. °Document Released: 04/12/2005 Document Revised: 07/05/2011 Document Reviewed: 09/09/2010 °ExitCare® Patient Information ©2015 [employer]. This information is not intended to replace advice given to you by your health care provider. Make sure you discuss any questions you have with your health care provider. ° °

## 2014-11-16 NOTE — ED Notes (Signed)
Reported large emesis episode to Dr. Ranae Palms. MD gives verbal order for  of zofran.

## 2014-11-16 NOTE — Progress Notes (Signed)
Utilization Review completed. Sanford Lindblad RN BSN CM 

## 2014-11-16 NOTE — H&P (Signed)
[facility] History and Physical                                                                                    Cheryl Parker, is a 34 y.o. female  MRN: [id number]   DOB - 1980/08/16  Admit Date - 11/15/2014  Outpatient Primary MD for the patient is No primary care provider on file.  Referring MD: Lita Mains / ER  With History of -  Past Medical History  Diagnosis Date  . Hypertension   . Pregnancy induced hypertension   . 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 Gestational diabetes   . DKA (diabetic ketoacidoses) 09/10/2014      Past Surgical History  Procedure Laterality Date  . Cesarean section    . Cesarean section N/A 07/05/2012    Procedure: CESAREAN SECTION;  Surgeon: Mora Bellman, MD;  Location: [facility];  Service: Obstetrics;  Laterality: N/A;    in for   Chief Complaint  Patient presents with  . Hyperglycemia     HPI This is a 34 year old female patient with initial gestational diabetes now converted to permanent diabetes-last hospitalized June 2016 with DKA. Presents with issues with ongoing nausea vomiting and suprapubic abdominal discomfort. Other past medical history includes pregnancy-induced hypertension and prior C-section. Patient reported to the ER with complaints of nausea and vomiting abrupt onset 2-3 hours prior to presentation this is been associated with sharp burning upper abdominal pain radiating into the back. She also reported she had not had a bowel movement in several days and attempted over-the-counter herbal therapy with no relief. She's been unable to keep anything down since onset of symptoms. Apparently she's had intermittent issues with symptoms of nausea and abdominal discomfort and vomiting for the past 4 weeks. After her recent discharge for DKA she apparently did not follow up with the provider and therefore did not get insulin prescription filled. At some point she went to an urgent care and obtain a  prescription for metformin but this caused GI symptoms as above so she stopped. Subsequently was prescribed Januvia which she took until the end of June and stopped this because of ongoing GI symptoms of nausea and vomiting. In addition in May and in June she was having dental pain and took relatively regular doses of Aleve over-the-counter every 12 hours but has not taken any Aleve since June. She reports emesis as essentially nonbloody except for occasional specks of blood after repeated episodes of emesis. She has not had any dark tarry or bloody stools. She did have some mild shortness of breath with today's onset of abdominal pain. She's not had a bowel movement in several days but has noticed increased urinary output and increased thirst.  In the ER she was afebrile and normotensive slightly tachycardic with a pulse between 110 and 1:15, room air sats were 100%, respiratory rate was normal. On clinical exam she appeared to be fine depleted so she was given 2 L of fluid. Has revealed sodium 133 potassium 3.3 glucose has been between 176 and 219, she did have leukocytosis white count 17,400 and platelets were slightly elevated at 425,000. Point of  care pregnancy test was positive. Urinalysis was unremarkable except for greater than 1000 glucose and greater than 80 ketones, she appeared to be dehydrated with a urine specific gravity 1.021. Abdominal ultrasound was unremarkable.   Review of Systems   In addition to the HPI above,  No Fever-chills, myalgias or other constitutional symptoms No Headache, changes with Vision or hearing, new weakness, tingling, numbness in any extremity, No problems swallowing food or Liquids, indigestion/reflux No Chest pain, Cough or Shortness of Breath, palpitations, orthopnea or DOE No melena or hematochezia, no dark tarry stools, Bowel movements are regular, No dysuria, hematuria or flank pain No new skin rashes, lesions, masses or bruises, No new joints  pains-aches No recent weight gain or loss   *A full 10 point Review of Systems was done, except as stated above, all other Review of Systems were negative.  Social History History  Substance Use Topics  . Smoking status: Never Smoker   . Smokeless tobacco: Never Used  . Alcohol Use: No    Resides at: Private residence  Lives with: With her young children  Ambulatory status: Without assistive devices   Family History Family History  Problem Relation Age of Onset  . Diabetes Mother      Prior to Admission medications   Medication Sig Start Date End Date Taking? Authorizing Provider  ACCU-CHEK FASTCLIX LANCETS MISC Use 1-4 times daily as needed to check blood glucose. 09/12/14  Yes Vivi Barrack, MD  acetaminophen (TYLENOL) 325 MG tablet Take 650 mg by mouth every 6 (six) hours as needed for mild pain.   Yes Historical Provider, MD  aspirin 81 MG chewable tablet Chew 1 tablet (81 mg total) by mouth daily. 09/12/14  Yes Vivi Barrack, MD  atorvastatin (LIPITOR) 20 MG tablet TAKE 1 TABLET BY MOUTH DAILY AT 6 PM 09/12/14  Yes Vivi Barrack, MD  Blood Glucose Monitoring Suppl (ACCU-CHEK NANO SMARTVIEW) W/DEVICE KIT Used daily as needed to check blood glucose 09/12/14  Yes Vivi Barrack, MD  glucose blood (ACCU-CHEK SMARTVIEW) test strip Use 1-4 times daily as needed to check blood glucose. 09/12/14  Yes Vivi Barrack, MD  JANUVIA 100 MG tablet TAKE 1 TABLET BY MOUTH DAILY 09/12/14  Yes Vivi Barrack, MD  metFORMIN (GLUCOPHAGE) 500 MG tablet Take 1 tablet (500 mg total) by mouth 2 (two) times daily with a meal. 09/12/14  Yes Vivi Barrack, MD  traMADol (ULTRAM) 50 MG tablet Take 1 tablet (50 mg total) by mouth every 6 (six) hours as needed. Patient taking differently: Take 50 mg by mouth every 6 (six) hours as needed for moderate pain.  09/07/14  Yes Wandra Arthurs, MD  esomeprazole (NEXIUM) 40 MG capsule Take 1 capsule (40 mg total) by mouth daily. 11/16/14   Julianne Rice, MD   ondansetron (ZOFRAN ODT) 4 MG disintegrating tablet 26m ODT q4 hours prn nausea/vomit 11/16/14   DJulianne Rice MD    No Known Allergies  Physical Exam  Vitals  Blood pressure 129/72, pulse 111, temperature 98.7 F (37.1 C), temperature source Oral, resp. rate 18, weight 159 lb (72.122 kg), SpO2 100 %, unknown if currently breastfeeding.   General:  In no acute distress, appears healthy and well nourished  Psych:  Normal affect, Denies Suicidal or Homicidal ideations, Awake Alert, Oriented X 3. Speech and thought patterns are clear and appropriate, no apparent short term memory deficits  Neuro:   No focal neurological deficits, CN II through XII intact, Strength 5/5  all 4 extremities, Sensation intact all 4 extremities.  ENT:  Ears and Eyes appear Normal, Conjunctivae clear, PER. Moist oral mucosa without erythema or exudates.  Neck:  Supple, No lymphadenopathy appreciated  Respiratory:  Symmetrical chest wall movement, Good air movement bilaterally, CTAB. Room Air  Cardiac:  RRR, No Murmurs, no LE edema noted, no JVD, No carotid bruits, peripheral pulses palpable at 2+  Abdomen:  Positive bowel sounds, Soft, Non tender, Non distended,  No masses appreciated, no obvious hepatosplenomegaly  Skin:  No Cyanosis, Normal Skin Turgor, No Skin Rash or Bruise.  Extremities: Symmetrical without obvious trauma or injury,  no effusions.  Data Review  CBC  Recent Labs Lab 11/15/14 2301  WBC 17.4*  HGB 13.7  HCT 39.5  PLT 425*  MCV 91.6  MCH 31.8  MCHC 34.7  RDW 12.1    Chemistries   Recent Labs Lab 11/15/14 2301  NA 133*  K 3.3*  CL 103  CO2 19*  GLUCOSE 176*  BUN 7  CREATININE 0.63  CALCIUM 9.6  AST 18  ALT 33  ALKPHOS 79  BILITOT 0.6    estimated creatinine clearance is 92.1 mL/min (by C-G formula based on Cr of 0.63).  No results for input(s): TSH, T4TOTAL, T3FREE, THYROIDAB in the last 72 hours.  Invalid input(s): FREET3  Coagulation profile No  results for input(s): INR, PROTIME in the last 168 hours.  No results for input(s): DDIMER in the last 72 hours.  Cardiac Enzymes No results for input(s): CKMB, TROPONINI, MYOGLOBIN in the last 168 hours.  Invalid input(s): CK  Invalid input(s): POCBNP  Urinalysis    Component Value Date/Time   COLORURINE YELLOW 11/16/2014 [place] 11/16/2014 0434   LABSPEC 1.021 11/16/2014 0434   PHURINE 5.0 11/16/2014 0434   GLUCOSEU >1000* 11/16/2014 0434   HGBUR NEGATIVE 11/16/2014 0434   BILIRUBINUR NEGATIVE 11/16/2014 0434   KETONESUR >80* 11/16/2014 0434   PROTEINUR NEGATIVE 11/16/2014 0434   UROBILINOGEN 0.2 11/16/2014 0434   NITRITE NEGATIVE 11/16/2014 0434   LEUKOCYTESUR NEGATIVE 11/16/2014 0434    Imaging results:   US Abdomen Complete  11/16/2014   CLINICAL DATA:  34 year old female with upper abdominal pain and vomiting  EXAM: ULTRASOUND ABDOMEN COMPLETE  COMPARISON:  CT dated 09/07/2014  FINDINGS: Gallbladder: No gallstones or wall thickening visualized. No sonographic Murphy sign noted.  Common bile duct: Diameter: 4 mm  Liver: No focal lesion identified. Within normal limits in parenchymal echogenicity.  IVC: No abnormality visualized.  Pancreas: Visualized portion unremarkable.  Spleen: Size and appearance within normal limits.  Right Kidney: Length: 12 cm. Echogenicity within normal limits. No mass or hydronephrosis visualized.  Left Kidney: Length: 12 cm. Echogenicity within normal limits. No mass or hydronephrosis visualized.  Abdominal aorta: No aneurysm visualized.  Other findings: None.  IMPRESSION: Unremarkable abdominal ultrasound.   Electronically Signed   By: Anner Crete M.D.   On: 11/16/2014 02:04     Assessment & Plan  Principal Problem:   Hyperemesis gravidarum -Telemetry bed since will be on scheduled Zofran -IV fluids and Zofran every 8 hours with every 12 hour Pepcid -Has reported intermittent issues with abdominal discomfort but no  specific signs of GI bleeding; as precaution we'll check stools for fecal occult blood since she reported regular NSAID use in June -Check TSH -Newly diagnosed pregnant this admission & does not have primary OB/GYN-have asked case management to assist, primarily [employer] case management since they followed this patient during her previous pregnancy  in 2014 -Serum hCG positive -Check pelvic ultrasound to confirm intrauterine pregnancy and to estimate gestational age of pregnancy -Previous pregnancy with scheduled C-section delivery and patient previously documented with history of pregnancy-induced hypertension  Active Problems:   Hypokalemia -Likely from recurrent emesis as well as poorly controlled diabetes/hyperglycemia -Continue IV fluids with potassium -Check magnesium -BMET in am    Dehydration with hyponatremia -Likely multifactorial from hyperglycemia and recurrent emesis -IV fluids and follow labs    Type 2 diabetes mellitus with hyperglycemia -Resume Lantus and begin at 5 units daily -Check CBGs and provide SSI -Suspect GI intolerance symptoms from metformin and Januvia may have been representative of nausea and vomiting from undiagnosed pregnancy; patient reports that pregnancy test during June admission was negative -Januvia listed as cautious used her in pregnancy due to an adequate human data available; metformin safe during pregnancy but with patient having current GI symptoms may not be best choice of drug for her    Leukocytosis -Likely reactive giving no other clinical signs of infection -Repeat CBC in a.m.    HLD  -Lipitor on hold secondary to nausea and vomiting -Lipitor is contraindicated during pregnancy -Need to discuss further with patient she was actually taking this medication; had not been taking diabetes medicines as prescribed    DVT Prophylaxis: Lovenox  Family Communication:   No family at bedside  Code Status:  Full code  Condition:   Stable  Discharge disposition: Anticipate discharge home once patient adequately hydrated and N/V adequately controlled  Time spent in minutes : 60      ELLIS,ALLISON L. ANP on 11/16/2014 at 8:45 AM  Between 7am to 7pm - Pager - [phone]  After 7pm go to [email or web address] - password [email or web address]  And look for the night coverage person covering me after hours  [facility]   I have taken an interval history, reviewed the chart and examined the patient. I agree with the Advanced Practice Provider's note, impression and recommendations. I have made any necessary editorial changes. 33 year old female with a history of diabetes mellitus, came to the ED with nausea vomiting, found to be hyperglycemic and found to be pregnant with positive urine hCG . Patient likely presenting with hyperemesis gravidarum, started on Zofran, Pepcid, IV fluids. Will repeat his potassium. Check magnesium. Will check pelvic ultrasound to confirm the pregnancy Resume patient's Lantus and sliding scale insulin with NovoLog. Hold Lipitor as it is contraindicated during pregnancy. Patient will need follow-up with OB as outpatient.

## 2014-11-16 NOTE — Progress Notes (Signed)
OB pelvic US confirms gestational age at 7 weeks.  Junious Silk, ANP

## 2014-11-16 NOTE — ED Notes (Signed)
Ginger ale, saltine crackers provided. Encouraged to void.

## 2014-11-17 ENCOUNTER — Encounter (HOSPITAL_COMMUNITY): Payer: Self-pay | Admitting: *Deleted

## 2014-11-17 DIAGNOSIS — O210 Mild hyperemesis gravidarum: Secondary | ICD-10-CM | POA: Diagnosis present

## 2014-11-17 LAB — CBC
HCT: 36.6 % (ref 36.0–46.0)
Hemoglobin: 12.5 g/dL (ref 12.0–15.0)
MCH: 31.8 pg (ref 26.0–34.0)
MCHC: 34.2 g/dL (ref 30.0–36.0)
MCV: 93.1 fL (ref 78.0–100.0)
PLATELETS: 362 10*3/uL (ref 150–400)
RBC: 3.93 MIL/uL (ref 3.87–5.11)
RDW: 12.2 % (ref 11.5–15.5)
WBC: 15.1 10*3/uL — AB (ref 4.0–10.5)

## 2014-11-17 LAB — COMPREHENSIVE METABOLIC PANEL
ALBUMIN: 3.4 g/dL — AB (ref 3.5–5.0)
ALT: 22 U/L (ref 14–54)
ANION GAP: 9 (ref 5–15)
AST: 11 U/L — ABNORMAL LOW (ref 15–41)
Alkaline Phosphatase: 75 U/L (ref 38–126)
BILIRUBIN TOTAL: 0.8 mg/dL (ref 0.3–1.2)
BUN: <5 mg/dL — ABNORMAL LOW (ref 6–20)
CO2: 17 mmol/L — ABNORMAL LOW (ref 22–32)
Calcium: 8.3 mg/dL — ABNORMAL LOW (ref 8.9–10.3)
Chloride: 105 mmol/L (ref 101–111)
Creatinine, Ser: 0.54 mg/dL (ref 0.44–1.00)
Glucose, Bld: 139 mg/dL — ABNORMAL HIGH (ref 65–99)
POTASSIUM: 3.5 mmol/L (ref 3.5–5.1)
SODIUM: 131 mmol/L — AB (ref 135–145)
TOTAL PROTEIN: 5.9 g/dL — AB (ref 6.5–8.1)

## 2014-11-17 LAB — GLUCOSE, CAPILLARY
GLUCOSE-CAPILLARY: 148 mg/dL — AB (ref 65–99)
GLUCOSE-CAPILLARY: 223 mg/dL — AB (ref 65–99)
Glucose-Capillary: 140 mg/dL — ABNORMAL HIGH (ref 65–99)
Glucose-Capillary: 143 mg/dL — ABNORMAL HIGH (ref 65–99)
Glucose-Capillary: 136 mg/dL — ABNORMAL HIGH (ref 65–99)
Glucose-Capillary: 147 mg/dL — ABNORMAL HIGH (ref 65–99)

## 2014-11-17 MED ORDER — PYRIDOXINE HCL 100 MG/ML IJ SOLN
100.0000 mg | Freq: Every day | INTRAMUSCULAR | Status: DC
  Administered 2014-11-17 – 2014-11-18 (×2): 100 mg via INTRAVENOUS
  Filled 2014-11-17 (×4): qty 1

## 2014-11-17 MED ORDER — ACETAMINOPHEN 325 MG PO TABS
325.0000 mg | ORAL_TABLET | ORAL | Status: DC | PRN
  Administered 2014-11-18 – 2014-11-19 (×5): 325 mg via ORAL
  Filled 2014-11-17 (×5): qty 1

## 2014-11-17 MED ORDER — PRENATAL MULTIVITAMIN CH
1.0000 | ORAL_TABLET | Freq: Every day | ORAL | Status: DC
  Administered 2014-11-18: 1 via ORAL
  Filled 2014-11-17: qty 1

## 2014-11-17 MED ORDER — PROMETHAZINE HCL 25 MG/ML IJ SOLN
25.0000 mg | INTRAMUSCULAR | Status: DC
  Administered 2014-11-17 – 2014-11-19 (×4): 25 mg via INTRAVENOUS
  Filled 2014-11-17 (×5): qty 1

## 2014-11-17 MED ORDER — ONDANSETRON HCL 4 MG/2ML IJ SOLN: 4.0000 mg | Freq: Four times a day (QID) | INTRAMUSCULAR | Status: DC | PRN

## 2014-11-17 MED ORDER — SODIUM CHLORIDE 0.9 % IV SOLN
25.0000 mg | Freq: Once | INTRAVENOUS | Status: DC
  Filled 2014-11-17: qty 1

## 2014-11-17 MED ORDER — SODIUM CHLORIDE 0.9 % IV SOLN
25.0000 mg | INTRAVENOUS | Status: DC
  Administered 2014-11-17: 25 mg via INTRAVENOUS
  Filled 2014-11-17 (×2): qty 1

## 2014-11-17 MED ORDER — FAMOTIDINE IN NACL 20-0.9 MG/50ML-% IV SOLN: 20.0000 mg | Freq: Two times a day (BID) | INTRAVENOUS | Status: DC

## 2014-11-17 MED ORDER — ONDANSETRON HCL 4 MG PO TABS: 4.0000 mg | ORAL_TABLET | Freq: Four times a day (QID) | ORAL | Status: DC | PRN

## 2014-11-17 NOTE — Progress Notes (Signed)
MEDICATION RELATED CONSULT NOTE - INITIAL   Pharmacy Consult for review of medications  Indication: first trimester pregnancy   Medications:  Scheduled:  . antiseptic oral rinse  7 mL Mouth Rinse q12n4p  . chlorhexidine  15 mL Mouth Rinse BID  . enoxaparin (LOVENOX) injection  40 mg Subcutaneous Q24H  . famotidine (PEPCID) IV  20 mg Intravenous Q12H  . feeding supplement  1 Container Oral TID BM  . folic acid  1 mg Intravenous Daily  . insulin aspart  0-15 Units Subcutaneous 6 times per day  . metoCLOPramide (REGLAN) injection  5 mg Intravenous TID AC & HS  . ondansetron (ZOFRAN) IV  4 mg Intravenous 3 times per day  . sodium chloride  3 mL Intravenous Q12H  . thiamine  100 mg Intravenous Daily    Assessment: 34yo female presenting to ED with N/V and found to be in first trimester of pregnancy.  I have reviewed her current medications and found Lantus to be Category C, though listed as OK to continue in pregnancy if pt stable on it prior.  This pt was not on Lantus pta and this has been communicated to MD.  Home Meds of concern:  Lipitor is Category X Nexium and Tramadol are both Category C Aspirin - recommended in setting of hx pre-eclampsia, treatment of complications d/t antiphospholipid syndrome, or prevention of thrombosis during 2nd & 3rd trimesters  Recommendation Do not resume Lipitor on discharge Change Nexium to Pepcid on discharge Consider discussing use of Tramadol and Aspirin with OB/Gyn before resuming  Marisue Humble, PharmD Clinical Pharmacist [facility]

## 2014-11-17 NOTE — Progress Notes (Signed)
Patient c/o that mid upper abdomen pain is cramping in nature and rates consistently as a 9 or higher.  She continues to be nauseated and vomits at times.  [facility] made aware that 1 mg Morphine only helps for about "15 minutes" and then pain level climbs again.  Rec'd order to give additional does of Morphine 2 mg IV which was given at 2305.  Will continue to monitor patient.  [employer] RN-BC, WTA.

## 2014-11-17 NOTE — Progress Notes (Signed)
Per Dr.I.Myers and patient request, patient for transfer to [facility], Room 318 via [facility]. Report called to [facility], [facility] notified, patient notified, and patient's sister (present in room) aware. All belongings gathered. Awaiting transport.Telemetry d/c'd. IVF to be saline locked when transport arrived.

## 2014-11-17 NOTE — Progress Notes (Signed)
Patient transferred to [facility] via [facility].  Patient's family took all her belongings.

## 2014-11-17 NOTE — Progress Notes (Signed)
Patient ID: Cheryl Parker, female   DOB: 1981-01-23, 34 y.o.   MRN: [id number]  [facility] PROGRESS NOTE  Shellia Hartl WUJ:[id number] DOB: Jan 30, 1981 DOA: 11/15/2014 PCP: No primary care provider on file.   Brief narrative:    Pt is 34 yo female, in first trimester of pregnancy, presented to [facility] ED with main concern of persistent nausea and non bloody vomiting, unable to keep anything down. No fevers, chills, no urinary concerns, no chest pain or shortness of breath.   Assessment/Plan:    Principal Problem:   Hyperemesis gravidarum - pt reports feeling better this AM and Zofran and reglan helping - ? Underlying gastroparesis contributing to vomiting a well - DM control  - continue current medical therapy, advance diet if pt able to tolerate   Active Problems:   Type 2 diabetes mellitus with hyperglycemia and gastroparesis  - continue long acting insulin with SSI for better control     Metabolic acidosis - from dehydration  - continue IVF and repeat BMP in AM    Hypokalemia secondary to vomiting   - supplemented and WNL this AM    Dehydration with hyponatremia - continue IVF and repeat BMP in AM    Thrombocytosis, acute  - reactive - resolved     Leukocytosis - possibly reactive and secondary to principal problem - no UTI based on UA and no symptoms suggestive of UTI - holding off on ABX for now - WBC is trending down and pt has been afebrile  - repeat CBC in AM  DVT prophylaxis - Lovenox SQ  Code Status: Full.  Family Communication:  plan of care discussed with the patient Disposition Plan: Home when stable.   IV access:  Peripheral IV  Procedures and diagnostic studies:    US Abdomen Complete 11/16/2014  Unremarkable abdominal ultrasound.   US Ob Transvaginal 11/16/2014  Intrauterine gestational sac, yolk sac, fetal pole and cardiac activity with crown-rump length corresponding to gestational age [redacted] weeks 0 days which is concordant with the assigned  gestational age of [redacted] weeks 1 day by LMP.   Medical Consultants:  None   Other Consultants:  None  IAnti-Infectives:   None   Debbora Presto, MD  [facility] Pager [phone]  If 7PM-7AM, please contact night-coverage [email or web address] Password [email or web address] 11/17/2014, 12:00 PM   LOS: 1 day   HPI/Subjective: No events overnight. Reports feeling better, still with poor oral intake   Objective: Filed Vitals:   11/16/14 2104 11/17/14 0409 11/17/14 0553 11/17/14 0815  BP: 131/69 122/78  135/75  Pulse: 89 79  82  Temp: 99.1 F (37.3 C) 98.2 F (36.8 C)  98.6 F (37 C)  TempSrc: Oral Oral  Oral  Resp: 17 16  18   Height:   5\' 2"  (1.575 m)   Weight:  74 kg (163 lb 2.3 oz)    SpO2: 100% 100%  100%    Intake/Output Summary (Last 24 hours) at 11/17/14 1200 Last data filed at 11/17/14 0940  Gross per 24 hour  Intake   2099 ml  Output      0 ml  Net   2099 ml    Exam:   General:  Pt is alert, follows commands appropriately, not in acute distress  Cardiovascular: Regular rate and rhythm, S1/S2, no murmurs, no rubs, no gallops  Respiratory: Clear to auscultation bilaterally, no wheezing, no crackles, no rhonchi  Abdomen: Soft, non tender, non distended, bowel sounds present, no guarding  Extremities: No edema, pulses DP and PT palpable  bilaterally  Data Reviewed: Basic Metabolic Panel:  Recent Labs Lab 11/15/14 2301 11/16/14 0948 11/17/14 0505  NA 133*  --  131*  K 3.3*  --  3.5  CL 103  --  105  CO2 19*  --  17*  GLUCOSE 176*  --  139*  BUN 7  --  <5*  CREATININE 0.63  --  0.54  CALCIUM 9.6  --  8.3*  MG  --  1.7  --    Liver Function Tests:  Recent Labs Lab 11/15/14 2301 11/17/14 0505  AST 18 11*  ALT 33 22  ALKPHOS 79 75  BILITOT 0.6 0.8  PROT 7.3 5.9*  ALBUMIN 4.2 3.4*    Recent Labs Lab 11/15/14 2301  LIPASE 36   CBC:  Recent Labs Lab 11/15/14 2301 11/17/14 0505  WBC 17.4* 15.1*  HGB 13.7 12.5  HCT 39.5 36.6  MCV 91.6 93.1  PLT 425* 362    CBG:  Recent Labs Lab 11/16/14 1623 11/16/14 2103 11/16/14 2356 11/17/14 0407 11/17/14 0804  GLUCAP 180* 184* 223* 148* 140*    Recent Results (from the past 240 hour(s))  Urine culture     Status: None (Preliminary result)   Collection Time: 11/16/14  7:32 PM  Result Value Ref Range Status   Specimen Description URINE, CLEAN CATCH  Final   Special Requests NONE  Final   Culture CULTURE REINCUBATED FOR BETTER GROWTH  Final   Report Status PENDING  Incomplete     Scheduled Meds: . antiseptic oral rinse  7 mL Mouth Rinse q12n4p  . chlorhexidine  15 mL Mouth Rinse BID  . enoxaparin (LOVENOX) injection  40 mg Subcutaneous Q24H  . famotidine (PEPCID) IV  20 mg Intravenous Q12H  . feeding supplement  1 Container Oral TID BM  . folic acid  1 mg Intravenous Daily  . insulin aspart  0-15 Units Subcutaneous 6 times per day  . insulin glargine  5 Units Subcutaneous Daily  . metoCLOPramide (REGLAN) injection  5 mg Intravenous TID AC & HS  . ondansetron (ZOFRAN) IV  4 mg Intravenous 3 times per day  . sodium chloride  3 mL Intravenous Q12H  . thiamine  100 mg Intravenous Daily   Continuous Infusions: . 0.9 % NaCl with KCl 20 mEq / L 125 mL/hr at 11/17/14 9604

## 2014-11-17 NOTE — H&P (Signed)
Cheryl Parker is an 34 y.o. female G3P2002 at [redacted] weeks gestational age transferred from [facility] for the treatment of hyperemesis in pregnancy. Patient was admitted on 7/23 secondary to abdominal pain, nausea and emesis. Patient did not know she was pregnant. She reports the onset of the abdominal pain a week ago after the discontinuation of a diabetic medication (she doesn't remember the name). She said the pain became progressively worst and she subsequently developed nausea and emesis and was not able to tolerate anything orally. The pain is located in the epigastric region and is worst with food consumption. Patient with history of gestational diabetes managed with insulin. She was then diagnosed with type 2 diabetes but could not tolerate metformin which she self discontinued. Patient is without any other complaints. She denies cramping/bleeding   Menstrual History:  Patient's last menstrual period was 09/25/2014 (approximate).    Past Medical History  Diagnosis Date  . Hypertension   . Pregnancy induced hypertension   . 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 Gestational diabetes   . DKA (diabetic ketoacidoses) 09/10/2014    Past Surgical History  Procedure Laterality Date  . Cesarean section    . Cesarean section N/A 07/05/2012    Procedure: CESAREAN SECTION;  Surgeon: Mora Bellman, MD;  Location: [facility];  Service: Obstetrics;  Laterality: N/A;    Family History  Problem Relation Age of Onset  . Diabetes Mother   . Kidney disease Father     Social History:  reports that she has never smoked. She has never used smokeless tobacco. She reports that she does not drink alcohol or use illicit drugs.  Allergies: No Known Allergies  Prescriptions prior to admission  Medication Sig Dispense Refill Last Dose  . ACCU-CHEK FASTCLIX LANCETS MISC Use 1-4 times daily as needed to check blood glucose. 102 each 0 Past Month at Unknown time  . Blood Glucose  Monitoring Suppl (ACCU-CHEK NANO SMARTVIEW) W/DEVICE KIT Used daily as needed to check blood glucose 1 kit 0 Past Month at Unknown time  . glucose blood (ACCU-CHEK SMARTVIEW) test strip Use 1-4 times daily as needed to check blood glucose. 100 each 0 Past Month at Unknown time  . JANUVIA 100 MG tablet TAKE 1 TABLET BY MOUTH DAILY 90 tablet 0 Past Month at Unknown time  . acetaminophen (TYLENOL) 325 MG tablet Take 650 mg by mouth every 6 (six) hours as needed for mild pain.   More than a month at Unknown time  . aspirin 81 MG chewable tablet Chew 1 tablet (81 mg total) by mouth daily. 30 tablet 0 More than a month at Unknown time  . atorvastatin (LIPITOR) 20 MG tablet TAKE 1 TABLET BY MOUTH DAILY AT 6 PM 90 tablet 0 More than a month at Unknown time  . metFORMIN (GLUCOPHAGE) 500 MG tablet Take 1 tablet (500 mg total) by mouth 2 (two) times daily with a meal. 60 tablet 0 More than a month at Unknown time  . traMADol (ULTRAM) 50 MG tablet Take 1 tablet (50 mg total) by mouth every 6 (six) hours as needed. (Patient taking differently: Take 50 mg by mouth every 6 (six) hours as needed for moderate pain. ) 8 tablet 0 More than a month at Unknown time    Review of Systems  All other systems reviewed and are negative.   Blood pressure 133/73, pulse 88, temperature 99.3 F (37.4 C), temperature source Oral, resp. rate 20, height '5\' 2"'  (1.575  m), weight 163 lb 2.3 oz (74 kg), last menstrual period 09/25/2014, SpO2 100 %, not currently breastfeeding. Physical Exam GENERAL: Well-developed, well-nourished female in no acute distress.  LUNGS: Clear to auscultation bilaterally.  HEART: Regular rate and rhythm. ABDOMEN: Soft, nontender, nondistended. No organomegaly. PELVIC: Not performed EXTREMITIES: No cyanosis, clubbing, or edema, 2+ distal pulses.  Results for orders placed or performed during the hospital encounter of 11/15/14 (from the past 24 hour(s))  Glucose, capillary     Status: Abnormal    Collection Time: 11/16/14 11:56 PM  Result Value Ref Range   Glucose-Capillary 223 (H) 65 - 99 mg/dL  Glucose, capillary     Status: Abnormal   Collection Time: 11/17/14  4:07 AM  Result Value Ref Range   Glucose-Capillary 148 (H) 65 - 99 mg/dL  Comprehensive metabolic panel     Status: Abnormal   Collection Time: 11/17/14  5:05 AM  Result Value Ref Range   Sodium 131 (L) 135 - 145 mmol/L   Potassium 3.5 3.5 - 5.1 mmol/L   Chloride 105 101 - 111 mmol/L   CO2 17 (L) 22 - 32 mmol/L   Glucose, Bld 139 (H) 65 - 99 mg/dL   BUN <5 (L) 6 - 20 mg/dL   Creatinine, Ser 0.54 0.44 - 1.00 mg/dL   Calcium 8.3 (L) 8.9 - 10.3 mg/dL   Total Protein 5.9 (L) 6.5 - 8.1 g/dL   Albumin 3.4 (L) 3.5 - 5.0 g/dL   AST 11 (L) 15 - 41 U/L   ALT 22 14 - 54 U/L   Alkaline Phosphatase 75 38 - 126 U/L   Total Bilirubin 0.8 0.3 - 1.2 mg/dL   GFR calc non Af Amer >60 >60 mL/min   GFR calc Af Amer >60 >60 mL/min   Anion gap 9 5 - 15  CBC     Status: Abnormal   Collection Time: 11/17/14  5:05 AM  Result Value Ref Range   WBC 15.1 (H) 4.0 - 10.5 K/uL   RBC 3.93 3.87 - 5.11 MIL/uL   Hemoglobin 12.5 12.0 - 15.0 g/dL   HCT 36.6 36.0 - 46.0 %   MCV 93.1 78.0 - 100.0 fL   MCH 31.8 26.0 - 34.0 pg   MCHC 34.2 30.0 - 36.0 g/dL   RDW 12.2 11.5 - 15.5 %   Platelets 362 150 - 400 K/uL  Glucose, capillary     Status: Abnormal   Collection Time: 11/17/14  8:04 AM  Result Value Ref Range   Glucose-Capillary 140 (H) 65 - 99 mg/dL  Glucose, capillary     Status: Abnormal   Collection Time: 11/17/14 12:11 PM  Result Value Ref Range   Glucose-Capillary 143 (H) 65 - 99 mg/dL  Glucose, capillary     Status: Abnormal   Collection Time: 11/17/14  5:41 PM  Result Value Ref Range   Glucose-Capillary 136 (H) 65 - 99 mg/dL  Glucose, capillary     Status: Abnormal   Collection Time: 11/17/14 10:03 PM  Result Value Ref Range   Glucose-Capillary 147 (H) 65 - 99 mg/dL    US Abdomen Complete  11/16/2014   CLINICAL DATA:   34 year old female with upper abdominal pain and vomiting  EXAM: ULTRASOUND ABDOMEN COMPLETE  COMPARISON:  CT dated 09/07/2014  FINDINGS: Gallbladder: No gallstones or wall thickening visualized. No sonographic Murphy sign noted.  Common bile duct: Diameter: 4 mm  Liver: No focal lesion identified. Within normal limits in parenchymal echogenicity.  IVC: No abnormality visualized.  Pancreas: Visualized portion unremarkable.  Spleen: Size and appearance within normal limits.  Right Kidney: Length: 12 cm. Echogenicity within normal limits. No mass or hydronephrosis visualized.  Left Kidney: Length: 12 cm. Echogenicity within normal limits. No mass or hydronephrosis visualized.  Abdominal aorta: No aneurysm visualized.  Other findings: None.  IMPRESSION: Unremarkable abdominal ultrasound.   Electronically Signed   By: Anner Crete M.D.   On: 11/16/2014 02:04   US Ob Comp Less 14 Wks  11/16/2014   CLINICAL DATA:  Pelvic pain for 2 days with vomiting. Positive pregnancy test.  EXAM: OBSTETRIC <14 WK [place] AND TRANSVAGINAL OB US  TECHNIQUE: Both transabdominal and transvaginal ultrasound examinations were performed for complete evaluation of the gestation as well as the maternal uterus, adnexal regions, and pelvic cul-de-sac. Transvaginal technique was performed to assess early pregnancy.  COMPARISON:  None for this pregnancy  FINDINGS: Intrauterine gestational sac: Visualized/normal in shape.  Yolk sac:  Visualized  Embryo:  Visualized  Cardiac Activity: Visualized  Heart Rate: 125  Bpm  CRL:  10  mm   7 w   0 d                  [place] EDC: 07/05/15  Maternal uterus/adnexae: Trace free fluid in the cul-de-sac. Ovaries are normal.  IMPRESSION: Intrauterine gestational sac, yolk sac, fetal pole and cardiac activity with crown-rump length corresponding to gestational age [redacted] weeks 0 days which is concordant with the assigned gestational age of [redacted] weeks 1 day by LMP.   Electronically Signed   By: Conchita Paris M.D.   On:  11/16/2014 10:48   US Ob Transvaginal  11/16/2014   CLINICAL DATA:  Pelvic pain for 2 days with vomiting. Positive pregnancy test.  EXAM: OBSTETRIC <14 WK [place] AND TRANSVAGINAL OB US  TECHNIQUE: Both transabdominal and transvaginal ultrasound examinations were performed for complete evaluation of the gestation as well as the maternal uterus, adnexal regions, and pelvic cul-de-sac. Transvaginal technique was performed to assess early pregnancy.  COMPARISON:  None for this pregnancy  FINDINGS: Intrauterine gestational sac: Visualized/normal in shape.  Yolk sac:  Visualized  Embryo:  Visualized  Cardiac Activity: Visualized  Heart Rate: 125  Bpm  CRL:  10  mm   7 w   0 d                  [place] EDC: 07/05/15  Maternal uterus/adnexae: Trace free fluid in the cul-de-sac. Ovaries are normal.  IMPRESSION: Intrauterine gestational sac, yolk sac, fetal pole and cardiac activity with crown-rump length corresponding to gestational age [redacted] weeks 0 days which is concordant with the assigned gestational age of [redacted] weeks 1 day by LMP.   Electronically Signed   By: Conchita Paris M.D.   On: 11/16/2014 10:48    Assessment/Plan: 34 yo with hyperemesis 1)  Hyperemesis - Admit for IV hydration and antiemetics - Vitamin B6  - Advance diet as tolerated  2) Diabetes - Follow up on Hg A1C results - will monitor CBG and cover with sliding scale until patient is tolerating po - Will start insulin regimen upon discharge as patient would prefer that over restarting metformin  Continue routine care  Ginnette Gates 11/17/2014, 11:21 PM

## 2014-11-18 LAB — COMPREHENSIVE METABOLIC PANEL
ALBUMIN: 3.4 g/dL — AB (ref 3.5–5.0)
ALT: 18 U/L (ref 14–54)
AST: 13 U/L — ABNORMAL LOW (ref 15–41)
Alkaline Phosphatase: 65 U/L (ref 38–126)
Anion gap: 4 — ABNORMAL LOW (ref 5–15)
CALCIUM: 8 mg/dL — AB (ref 8.9–10.3)
CHLORIDE: 108 mmol/L (ref 101–111)
CO2: 18 mmol/L — AB (ref 22–32)
Creatinine, Ser: 0.49 mg/dL (ref 0.44–1.00)
GFR calc non Af Amer: >60 mL/min (ref >60)
Glucose, Bld: 141 mg/dL — ABNORMAL HIGH (ref 65–99)
Potassium: 3.7 mmol/L (ref 3.5–5.1)
SODIUM: 130 mmol/L — AB (ref 135–145)
TOTAL PROTEIN: 6.1 g/dL — AB (ref 6.5–8.1)
Total Bilirubin: 1.2 mg/dL (ref 0.3–1.2)

## 2014-11-18 LAB — HEMOGLOBIN A1C
Hgb A1c MFr Bld: 8 % — ABNORMAL HIGH (ref 4.8–5.6)
Mean Plasma Glucose: 183 mg/dL

## 2014-11-18 LAB — GLUCOSE, CAPILLARY
GLUCOSE-CAPILLARY: 162 mg/dL — AB (ref 65–99)
GLUCOSE-CAPILLARY: 116 mg/dL — AB (ref 65–99)
GLUCOSE-CAPILLARY: 175 mg/dL — AB (ref 65–99)
GLUCOSE-CAPILLARY: 143 mg/dL — AB (ref 65–99)
Glucose-Capillary: 129 mg/dL — ABNORMAL HIGH (ref 65–99)
Glucose-Capillary: 138 mg/dL — ABNORMAL HIGH (ref 65–99)

## 2014-11-18 LAB — URINE CULTURE

## 2014-11-18 MED ORDER — GLYBURIDE 2.5 MG PO TABS
2.5000 mg | ORAL_TABLET | Freq: Every day | ORAL | Status: DC
  Administered 2014-11-18: 2.5 mg via ORAL
  Filled 2014-11-18: qty 1

## 2014-11-18 MED ORDER — ENSURE ENLIVE PO LIQD
237.0000 mL | Freq: Three times a day (TID) | ORAL | Status: DC
  Administered 2014-11-18: 237 mL via ORAL
  Filled 2014-11-18 (×3): qty 237

## 2014-11-18 NOTE — Progress Notes (Signed)
Subjective: Patient reports significant improvement in her epigastric pain. She reports feeling somewhat better. No emesis overnight. She denies vaginal bleeding or pelvic cramp    Objective: I have reviewed patient's vital signs.  General: alert, cooperative and no distress Resp: clear to auscultation bilaterally Cardio: regular rate and rhythm GI: soft, non-tender; bowel sounds normal; no masses,  no organomegaly Extremities: Homans sign is negative, no sign of DVT   Assessment/Plan: 34 yo G3P2002 at [redacted] weeks gestation admitted with hyperemesis and poorly controlled DM  1) hyperemesis - Continue IV hydration - Continue antiemetics - Encourage po intake  2) type 2 DM - Continue monitoring CBGs - fasting this am of 141. Will start glyburide 2.5 mg qHS - Continue covering CBG with sliding scale - Follow up HgA1C (9.9 in May 2016)   LOS: 2 days    Khala Tarte 11/18/2014, 7:03 AM

## 2014-11-19 LAB — GLUCOSE, CAPILLARY
GLUCOSE-CAPILLARY: 86 mg/dL (ref 65–99)
Glucose-Capillary: 83 mg/dL (ref 65–99)
Glucose-Capillary: 69 mg/dL (ref 65–99)
Glucose-Capillary: 98 mg/dL (ref 65–99)

## 2014-11-19 MED ORDER — GLYBURIDE 2.5 MG PO TABS: 2.5000 mg | ORAL_TABLET | Freq: Every day | ORAL | Status: DC

## 2014-11-19 MED ORDER — PROMETHAZINE HCL 25 MG PO TABS: ORAL_TABLET | ORAL | Status: DC

## 2014-11-19 MED ORDER — PRENATAL MULTIVITAMIN CH: 1.0000 | ORAL_TABLET | Freq: Every day | ORAL | Status: DC

## 2014-11-19 MED ORDER — FAMOTIDINE 20 MG PO TABS: 20.0000 mg | ORAL_TABLET | Freq: Two times a day (BID) | ORAL | Status: DC

## 2014-11-19 MED ORDER — ACCU-CHEK NANO SMARTVIEW W/DEVICE KIT: 1.0000 | PACK | Freq: Four times a day (QID) | Status: DC

## 2014-11-19 NOTE — Discharge Summary (Signed)
Physician Discharge Summary  Patient ID: Cheryl Parker MRN: [id number] DOB/AGE: 35-Oct-1982 35 y.o.  Admit date: 11/15/2014 Discharge date: 11/19/2014  Admission Diagnoses:hypermesis gravidarum in first trimester Discharge Diagnoses:  Principal Problem:   Hyperemesis gravidarum Active Problems:   Type 2 diabetes mellitus with hyperglycemia   HLD (hyperlipidemia)   Hypokalemia   Dehydration with hyponatremia   Leukocytosis   Hyperemesis affecting pregnancy, antepartum   Discharged Condition: good  Hospital Course: Patient admitted on 7/22 secondary to abdominal pain and hyperemesis. Patient discovered that she was pregnant during this admission. Her [facility] for further management of hyperemesis. She received IV fluid and antiemetics. On HD#4 patient reported feeling very good and requested to be discharged. Patient is also a type 2 DM, managed on oral glycemic agent which she doesn't remember the name. Patient received glyburide 2.5 mg qhs which significantly improved her fasting CBG. Instructions provided on monitoring CBGs. Patient with a h/o GDM and knows the diet to follow as well as how to check her CBG. New ob appointment also made for the patient. All questions were answered  Consults: None  Significant Diagnostic Studies: 10/2314 ultrasound : living IUP at [redacted]w[redacted]d Treatments: IV hydration and phenergan, pepcid and glyburide  Discharge Exam: Blood pressure 111/60, pulse 89, temperature 99.1 F (37.3 C), temperature source Oral, resp. rate 18, height '5\' 2"'  (1.575 m), weight 158 lb 3 oz (71.753 kg), last menstrual period 09/25/2014, SpO2 100 %, not currently breastfeeding. General appearance: alert, cooperative and no distress Resp: clear to auscultation bilaterally Cardio: regular rate and rhythm Extremities: extremities normal, atraumatic, no cyanosis or edema and no edema, redness or tenderness in the calves or  thighs  Disposition: 01-Home or Self Care     Medication List    STOP taking these medications        ACCU-CHEK FASTCLIX LANCETS Misc     atorvastatin 20 MG tablet  Commonly known as:  LIPITOR     JANUVIA 100 MG tablet  Generic drug:  sitaGLIPtin     metFORMIN 500 MG tablet  Commonly known as:  GLUCOPHAGE     traMADol 50 MG tablet  Commonly known as:  ULTRAM      TAKE these medications        ACCU-CHEK NANO SMARTVIEW W/DEVICE Kit  1 Device by Does not apply route 4 (four) times daily.     acetaminophen 325 MG tablet  Commonly known as:  TYLENOL  Take 650 mg by mouth every 6 (six) hours as needed for mild pain.     aspirin 81 MG chewable tablet  Chew 1 tablet (81 mg total) by mouth daily.     esomeprazole 40 MG capsule  Commonly known as:  NEXIUM  Take 1 capsule (40 mg total) by mouth daily.     famotidine 20 MG tablet  Commonly known as:  PEPCID  Take 1 tablet (20 mg total) by mouth 2 (two) times daily.     glucose blood test strip  Commonly known as:  ACCU-CHEK SMARTVIEW  Use 1-4 times daily as needed to check blood glucose.     glyBURIDE 2.5 MG tablet  Commonly known as:  DIABETA  Take 1 tablet (2.5 mg total) by mouth at bedtime.     ondansetron 4 MG disintegrating tablet  Commonly known as:  ZOFRAN ODT  471mODT q4 hours prn nausea/vomit     prenatal multivitamin Tabs tablet  Take 1 tablet by mouth daily  at 12 noon.     promethazine 25 MG tablet  Commonly known as:  PHENERGAN  Take 1/2 to 1 tablet every 6 hours prn nausea           Follow-up Information    Schedule an appointment as soon as possible for a visit with Kend[facility].   Contact information:   [place] [phone]       Follow up with [facility].   Specialty:  Emergency Medicine   Why:  As needed, If symptoms worsen   Contact information:   [address] [id number] [place] [phone]      Follow up with [facility].   Specialty:  Obstetrics and Gynecology   Why:  An appointment will be made for you to start prenatal care   Contact information:   [place] [phone]      Signed: [place] 11/19/2014, 9:08 AM

## 2014-11-19 NOTE — Progress Notes (Signed)
Patient discharged home with father... Discharge instructions reviewed with patient and she verbalized understanding... Condition stable... No equipment.... Taken to car via wheelchair by Selinda Michaels, RN.

## 2014-11-19 NOTE — Progress Notes (Signed)
CBG 69 at 0610. Given graham crackers, declined juice offered on the unit but ate graham crackers. Recheck CBG result of 86. Will continue to monitor

## 2014-12-02 ENCOUNTER — Ambulatory Visit: Payer: Medicaid Other

## 2014-12-09 ENCOUNTER — Encounter: Payer: Medicaid Other | Attending: Obstetrics & Gynecology | Admitting: *Deleted

## 2014-12-09 ENCOUNTER — Other Ambulatory Visit: Payer: Self-pay | Admitting: Family Medicine

## 2014-12-09 ENCOUNTER — Ambulatory Visit: Payer: Medicaid Other | Admitting: *Deleted

## 2014-12-09 VITALS — Ht 62.0 in | Wt 158.2 lb

## 2014-12-09 DIAGNOSIS — O24419 Gestational diabetes mellitus in pregnancy, unspecified control: Secondary | ICD-10-CM

## 2014-12-09 DIAGNOSIS — O24911 Unspecified diabetes mellitus in pregnancy, first trimester: Secondary | ICD-10-CM | POA: Diagnosis present

## 2014-12-09 DIAGNOSIS — IMO0001 Reserved for inherently not codable concepts without codable children: Secondary | ICD-10-CM

## 2014-12-09 DIAGNOSIS — R030 Elevated blood-pressure reading, without diagnosis of hypertension: Secondary | ICD-10-CM

## 2014-12-09 DIAGNOSIS — Z713 Dietary counseling and surveillance: Secondary | ICD-10-CM | POA: Insufficient documentation

## 2014-12-09 MED ORDER — INSULIN NPH (HUMAN) (ISOPHANE) 100 UNIT/ML ~~LOC~~ SUSP: 8.0000 [IU] | Freq: Two times a day (BID) | SUBCUTANEOUS | Status: DC

## 2014-12-09 MED ORDER — GLUCOSE BLOOD VI STRP: ORAL_STRIP | Status: DC

## 2014-12-09 MED ORDER — INSULIN ASPART 100 UNIT/ML ~~LOC~~ SOLN: 11.0000 [IU] | Freq: Three times a day (TID) | SUBCUTANEOUS | Status: DC

## 2014-12-09 MED ORDER — "INSULIN SYRINGE 31G X 5/16"" 0.3 ML MISC": 1.0000 | Freq: Every day | Status: DC

## 2014-12-09 MED ORDER — ACCU-CHEK FASTCLIX LANCETS MISC: 1.0000 | Freq: Four times a day (QID) | Status: DC

## 2014-12-09 NOTE — Progress Notes (Signed)
Nutrition note: GDM diet education Pt has type 2 DM & has been having elevated BS- fasting: 151-287, pp: 124-249. Pt reports eating 3 meals/d. Pt is taking a gummy PNV. Pt reports having N&V and heartburn but that medication has helped improve both. Pt reports no walking or physical activity. Pt received verbal & written review of DM diet during pregnancy. Pt agrees to follow DM diet with 3 meals & 1-3 snacks/d with proper CHO/ protein combination. Pt does not have WIC & is not interested in receiving it yet. Pt is unsure about BF. F/u in 4-6 wks Blondell Reveal, MS, RD, LDN, [facility]

## 2014-12-09 NOTE — Progress Notes (Signed)
Patient present following hospitalization for hyperemesis. Found to be [redacted]wks pregnant. Utilized insulin while in [facility] hospital. Discharged on Metformin and januvia, continued hyperemesis. Readmit stay at [facility] started on glyburide 2.5mg  HS. Glucose readings FBS 151-287, PP readings 124-249mg /dl. Reviewed with Dr. Shawnie Pons. Insulin started. She will be taking NPH 8units before breakfast and HS  and Novolog /Humolog 11 units 15-30 minutes before meals.

## 2014-12-11 LAB — COMPREHENSIVE METABOLIC PANEL
ALBUMIN: 3.8 g/dL (ref 3.6–5.1)
ALT: 35 U/L — ABNORMAL HIGH (ref 6–29)
AST: 23 U/L (ref 10–30)
Alkaline Phosphatase: 74 U/L (ref 33–115)
BILIRUBIN TOTAL: 0.4 mg/dL (ref 0.2–1.2)
BUN: 8 mg/dL (ref 7–25)
CALCIUM: 9.1 mg/dL (ref 8.6–10.2)
CO2: 21 mmol/L (ref 20–31)
Chloride: 101 mmol/L (ref 98–110)
Creat: 0.57 mg/dL (ref 0.50–1.10)
Glucose, Bld: 228 mg/dL — ABNORMAL HIGH (ref 65–99)
Potassium: 4.3 mmol/L (ref 3.5–5.3)
Sodium: 134 mmol/L — ABNORMAL LOW (ref 135–146)
Total Protein: 5.8 g/dL — ABNORMAL LOW (ref 6.1–8.1)

## 2014-12-11 LAB — CBC
HCT: 36.8 % (ref 36.0–46.0)
Hemoglobin: 12 g/dL (ref 12.0–15.0)
MCH: 31.1 pg (ref 26.0–34.0)
MCHC: 32.6 g/dL (ref 30.0–36.0)
MCV: 95.3 fL (ref 78.0–100.0)
MPV: 10 fL (ref 8.6–12.4)
PLATELETS: 412 10*3/uL — AB (ref 150–400)
RBC: 3.86 MIL/uL — ABNORMAL LOW (ref 3.87–5.11)
RDW: 13.1 % (ref 11.5–15.5)
WBC: 12.4 10*3/uL — ABNORMAL HIGH (ref 4.0–10.5)

## 2014-12-13 ENCOUNTER — Encounter: Payer: Self-pay | Admitting: Family Medicine

## 2014-12-13 DIAGNOSIS — O34219 Maternal care for unspecified type scar from previous cesarean delivery: Secondary | ICD-10-CM | POA: Insufficient documentation

## 2014-12-13 DIAGNOSIS — O24919 Unspecified diabetes mellitus in pregnancy, unspecified trimester: Secondary | ICD-10-CM | POA: Insufficient documentation

## 2014-12-13 LAB — PROTEIN, URINE, 24 HOUR
Protein, 24H Urine: 130 mg/d (ref <150)
Protein, Urine: 8 mg/dL (ref 5–24)

## 2014-12-16 ENCOUNTER — Ambulatory Visit: Payer: Medicaid Other

## 2014-12-23 ENCOUNTER — Encounter: Payer: Self-pay | Admitting: Obstetrics & Gynecology

## 2014-12-23 ENCOUNTER — Ambulatory Visit: Payer: Medicaid Other

## 2014-12-23 ENCOUNTER — Ambulatory Visit (INDEPENDENT_AMBULATORY_CARE_PROVIDER_SITE_OTHER): Payer: Medicaid Other | Admitting: Obstetrics & Gynecology

## 2014-12-23 ENCOUNTER — Other Ambulatory Visit (HOSPITAL_COMMUNITY)
Admission: RE | Admit: 2014-12-23 | Discharge: 2014-12-23 | Disposition: A | Discharge status: Home / Self Care | Payer: Medicaid Other | Source: Ambulatory Visit | Attending: Obstetrics & Gynecology | Admitting: Obstetrics & Gynecology

## 2014-12-23 VITALS — BP 129/74 | HR 122 | Temp 98.5°F | Wt 160.1 lb

## 2014-12-23 DIAGNOSIS — E876 Hypokalemia: Secondary | ICD-10-CM | POA: Diagnosis not present

## 2014-12-23 DIAGNOSIS — O09521 Supervision of elderly multigravida, first trimester: Secondary | ICD-10-CM | POA: Diagnosis not present

## 2014-12-23 DIAGNOSIS — Z1151 Encounter for screening for human papillomavirus (HPV): Secondary | ICD-10-CM | POA: Insufficient documentation

## 2014-12-23 DIAGNOSIS — O24111 Pre-existing diabetes mellitus, type 2, in pregnancy, first trimester: Secondary | ICD-10-CM

## 2014-12-23 DIAGNOSIS — O99211 Obesity complicating pregnancy, first trimester: Secondary | ICD-10-CM

## 2014-12-23 DIAGNOSIS — Z23 Encounter for immunization: Secondary | ICD-10-CM

## 2014-12-23 DIAGNOSIS — O3421 Maternal care for scar from previous cesarean delivery: Secondary | ICD-10-CM

## 2014-12-23 DIAGNOSIS — O24911 Unspecified diabetes mellitus in pregnancy, first trimester: Secondary | ICD-10-CM

## 2014-12-23 DIAGNOSIS — Z01419 Encounter for gynecological examination (general) (routine) without abnormal findings: Secondary | ICD-10-CM | POA: Insufficient documentation

## 2014-12-23 DIAGNOSIS — O24419 Gestational diabetes mellitus in pregnancy, unspecified control: Secondary | ICD-10-CM

## 2014-12-23 LAB — POCT URINALYSIS DIP (DEVICE)
BILIRUBIN URINE: NEGATIVE
Glucose, UA: 500 mg/dL — AB
Hgb urine dipstick: NEGATIVE
Ketones, ur: 40 mg/dL — AB
Leukocytes, UA: NEGATIVE
NITRITE: NEGATIVE
PROTEIN: 30 mg/dL — AB
Specific Gravity, Urine: >=1.03 (ref 1.005–1.030)
Urobilinogen, UA: 0.2 mg/dL (ref 0.0–1.0)
pH: 5.5 (ref 5.0–8.0)

## 2014-12-23 MED ORDER — PROMETHAZINE HCL 25 MG RE SUPP: 25.0000 mg | Freq: Four times a day (QID) | RECTAL | Status: DC | PRN

## 2014-12-23 MED ORDER — INSULIN NPH (HUMAN) (ISOPHANE) 100 UNIT/ML ~~LOC~~ SUSP: SUBCUTANEOUS | Status: DC

## 2014-12-23 MED ORDER — GLUCOSE BLOOD VI STRP: ORAL_STRIP | Status: DC

## 2014-12-23 MED ORDER — INSULIN ASPART 100 UNIT/ML ~~LOC~~ SOLN: SUBCUTANEOUS | Status: DC

## 2014-12-23 NOTE — Progress Notes (Signed)
Patient reports nausea/vomiting which limits how much she can eat Reports pain in her legs at night which have occurred since taking the insulin

## 2014-12-24 ENCOUNTER — Encounter: Payer: Self-pay | Admitting: Obstetrics & Gynecology

## 2014-12-24 ENCOUNTER — Encounter: Payer: Self-pay | Admitting: *Deleted

## 2014-12-24 ENCOUNTER — Telehealth: Payer: Self-pay | Admitting: *Deleted

## 2014-12-24 LAB — PRENATAL PROFILE (SOLSTAS)
ANTIBODY SCREEN: NEGATIVE
BASOS PCT: 0 % (ref 0–1)
Basophils Absolute: 0 10*3/uL (ref 0.0–0.1)
EOS ABS: 0 10*3/uL (ref 0.0–0.7)
Eosinophils Relative: 0 % (ref 0–5)
HEMATOCRIT: 37.7 % (ref 36.0–46.0)
HIV 1&2 Ab, 4th Generation: NONREACTIVE
Hemoglobin: 12.6 g/dL (ref 12.0–15.0)
Hepatitis B Surface Ag: NEGATIVE
Lymphocytes Relative: 14 % (ref 12–46)
Lymphs Abs: 2.1 10*3/uL (ref 0.7–4.0)
MCH: 32.2 pg (ref 26.0–34.0)
MCHC: 33.4 g/dL (ref 30.0–36.0)
MCV: 96.4 fL (ref 78.0–100.0)
MONO ABS: 0.6 10*3/uL (ref 0.1–1.0)
MPV: 9.7 fL (ref 8.6–12.4)
Monocytes Relative: 4 % (ref 3–12)
NEUTROS ABS: 12.1 10*3/uL — AB (ref 1.7–7.7)
Neutrophils Relative %: 82 % — ABNORMAL HIGH (ref 43–77)
Platelets: 398 10*3/uL (ref 150–400)
RBC: 3.91 MIL/uL (ref 3.87–5.11)
RDW: 13.2 % (ref 11.5–15.5)
RH TYPE: NEGATIVE
Rubella: 1.02 Index — ABNORMAL HIGH (ref <0.90)
WBC: 14.7 10*3/uL — AB (ref 4.0–10.5)

## 2014-12-24 LAB — CULTURE, OB URINE
Colony Count: NO GROWTH
ORGANISM ID, BACTERIA: NO GROWTH

## 2014-12-24 LAB — CYTOLOGY - PAP

## 2014-12-24 NOTE — Progress Notes (Signed)
Genetic counseling scheduled for September 1 :00pm with [facility]>

## 2014-12-24 NOTE — Telephone Encounter (Addendum)
Attempted to contact patient, no answer, message states phone is not accepting incoming calls.  Called mother (emergency contact) and informed her that I needed to speak with Cheryl Parker and to have her call the clinic.  Pt needs to be informed of Genetic counseling appointment on December 26, 2014 @ 3:00pm in MFM.  8/31  [phone]  Called pt and heard same message stating that the person called is not accepting calls at this time. MFM notified that pt has not been reached with appt details.  Diane Day RNC

## 2014-12-24 NOTE — Progress Notes (Signed)
Subjective:    Cheryl Parker is a [id number] [redacted]w[redacted]d being seen today for her first obstetrical visit.  Her obstetrical history is significant for advanced maternal age, obesity and pregnancy induced hypertension, gestational diabetes, but now has Type 2 DM Patient does intend to breast feed. Pregnancy history fully reviewed.  Patient reports nausea and vomiting and does not like the pills (makes throat hurt).  Requests suppositories (phenergan). Reports muscle soreness in legs--will start magnesium 200 mg po daily.  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 Hypertension   . Pregnancy induced hypertension   . 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 DKA (diabetic ketoacidoses) 09/10/2014   Past Surgical History  Procedure Laterality Date  . Cesarean section    . Cesarean section N/A 07/05/2012    Procedure: CESAREAN SECTION;  Surgeon: Catalina Antigua, MD;  Location: [facility];  Service: Obstetrics;  Laterality: N/A;   Family History  Problem Relation Age of Onset  . Diabetes Mother   . Kidney disease Father      Exam    Uterus:     Pelvic Exam:    Perineum: No Hemorrhoids   Vulva: normal   Vagina:  normal mucosa   pH: n/a   Cervix: no lesions   Adnexa: normal adnexa   Bony Pelvis: gynecoid  System: Breast:  normal appearance, no masses or tenderness   Skin: normal coloration and turgor, no rashes    Neurologic: oriented, normal mood   Extremities: no deformities   HEENT  sclera clear, anicteric, oropharynx clear, no lesions, neck supple with midline trachea and thyroid without masses   Mouth/Teeth mucous membranes moist, pharynx normal without lesions   Neck supple and no masses   Cardiovascular: regular rate and rhythm   Respiratory:  appears well, vitals normal, no respiratory distress, acyanotic, normal RR, chest clear, no wheezing, crepitations, rhonchi, normal symmetric air entry   Abdomen: soft, non-tender; bowel sounds normal; no masses,  no organomegaly   Urinary: urethral meatus normal      Assessment:    Pregnancy: [id number] Patient Active Problem List   Diagnosis Date Noted  . Pre-existing Diabetes mellitus in pregnancy, antepartum 12/13/2014  . Previous cesarean section complicating pregnancy, antepartum condition or complication 12/13/2014  . Hyperemesis affecting pregnancy, antepartum 11/17/2014  . Hypokalemia 11/16/2014  . Dehydration with hyponatremia 11/16/2014  . Leukocytosis 11/16/2014  . HLD (hyperlipidemia)   . Type 2 diabetes mellitus with hyperglycemia   . Pain in the chest   . Epigastric pain         Plan:     Initial labs drawn. Prenatal vitamins. Problem list reviewed and updated. Genetic Screening discussed First Screen: ordered. (pt will be AMA--could do [facility]IPS)  Ultrasound discussed;  fetal survey: requested.  Follow up in 1 weeks. Patient has high fasting ans pp cbgs.  All doses of insulin increased (14 Novalog tid and 12 NPH bid) Refer to nutrition to review diet again. Explained to her in detail that she is a type 2 DM with hgb A1C 9.9 4 months ago. Phenergan suppositories for nausea Baby asa  Collyns Mcquigg H. 12/24/2014

## 2014-12-25 NOTE — Telephone Encounter (Signed)
Contacted patient, appointment given, pt verbalizes understanding. Pt states she cannot make this appointment time, number to MFM given for patient to change appointment date/time.

## 2014-12-26 ENCOUNTER — Other Ambulatory Visit (HOSPITAL_COMMUNITY): Payer: Medicaid Other

## 2014-12-26 ENCOUNTER — Encounter (HOSPITAL_COMMUNITY): Payer: Medicaid Other

## 2014-12-26 LAB — HEMOGLOBINOPATHY EVALUATION
HGB A2 QUANT: 2.6 % (ref 2.2–3.2)
HGB F QUANT: 0 % (ref 0.0–2.0)
Hemoglobin Other: 0 %
Hgb A: 97.4 % (ref 96.8–97.8)
Hgb S Quant: 0 %

## 2014-12-27 LAB — CANNABANOIDS (GC/LC/MS), URINE: THC-COOH (GC/LC/MS), ur confirm: 40 ng/mL — AB (ref <5)

## 2014-12-28 LAB — PRESCRIPTION MONITORING PROFILE (19 PANEL)
AMPHETAMINE/METH: NEGATIVE ng/mL
Barbiturate Screen, Urine: NEGATIVE ng/mL
Benzodiazepine Screen, Urine: NEGATIVE ng/mL
Buprenorphine, Urine: NEGATIVE ng/mL
CARISOPRODOL, URINE: NEGATIVE ng/mL
COCAINE METABOLITES: NEGATIVE ng/mL
Creatinine, Urine: 227.4 mg/dL (ref >20.0)
Fentanyl, Ur: NEGATIVE ng/mL
MDMA URINE: NEGATIVE ng/mL
Meperidine, Ur: NEGATIVE ng/mL
Methadone Screen, Urine: NEGATIVE ng/mL
Methaqualone: NEGATIVE ng/mL
Nitrites, Initial: NEGATIVE ug/mL
OXYCODONE SCRN UR: NEGATIVE ng/mL
Opiate Screen, Urine: NEGATIVE ng/mL
Phencyclidine, Ur: NEGATIVE ng/mL
Propoxyphene: NEGATIVE ng/mL
TAPENTADOLUR: NEGATIVE ng/mL
TRAMADOL UR: NEGATIVE ng/mL
Zolpidem, Urine: NEGATIVE ng/mL
pH, Initial: 5.9 pH (ref 4.5–8.9)

## 2015-01-06 ENCOUNTER — Ambulatory Visit (INDEPENDENT_AMBULATORY_CARE_PROVIDER_SITE_OTHER): Payer: Self-pay | Admitting: Obstetrics & Gynecology

## 2015-01-06 VITALS — BP 116/74 | HR 97 | Temp 98.5°F | Wt 163.0 lb

## 2015-01-06 DIAGNOSIS — Z3492 Encounter for supervision of normal pregnancy, unspecified, second trimester: Secondary | ICD-10-CM

## 2015-01-06 DIAGNOSIS — O24912 Unspecified diabetes mellitus in pregnancy, second trimester: Secondary | ICD-10-CM

## 2015-01-06 LAB — POCT URINALYSIS DIP (DEVICE)
Bilirubin Urine: NEGATIVE
GLUCOSE, UA: 250 mg/dL — AB
Nitrite: NEGATIVE
PH: 5.5 (ref 5.0–8.0)
PROTEIN: NEGATIVE mg/dL
Urobilinogen, UA: 0.2 mg/dL (ref 0.0–1.0)

## 2015-01-06 NOTE — Progress Notes (Signed)
Subjective: did not bring her book today  Cheryl Parker is a 34 y.o. G3P2002 at [redacted]w[redacted]d being seen today for ongoing prenatal care.  Patient reports nausea.  Contractions: Not present.  Vag. Bleeding: None.  . Denies leaking of fluid.   The following portions of the patient's history were reviewed and updated as appropriate: allergies, current medications, past family history, past medical history, past social history, past surgical history and problem list.   Objective:   Filed Vitals:   01/06/15 1009  BP: 116/74  Pulse: 97  Temp: 98.5 F (36.9 C)  Weight: 163 lb (73.936 kg)    Fetal Status: Fetal Heart Rate (bpm): 156         General:  Alert, oriented and cooperative. Patient is in no acute distress.  Skin: Skin is warm and dry. No rash noted.   Cardiovascular: Normal heart rate noted  Respiratory: Normal respiratory effort, no problems with respiration noted  Abdomen: Soft, gravid, appropriate for gestational age. Pain/Pressure: Absent     Pelvic: Vag. Bleeding: None     Cervical exam deferred        Extremities: Normal range of motion.  Edema: None  Mental Status: Normal mood and affect. Normal behavior. Normal judgment and thought content.   Urinalysis: Urine Protein: Negative Urine Glucose: 2+  Assessment and Plan:  Pregnancy: G3P2002 at [redacted]w[redacted]d  1. Prenatal care in second trimester Needs US-ordered - POCT urinalysis dip (device) - [place] MFM OB COMP + 14 WK; Future  2. Diabetes mellitus in pregnancy, antepartum, second trimester States improved control but did not bring her book  Preterm labor symptoms and general obstetric precautions including but not limited to vaginal bleeding, contractions, leaking of fluid and fetal movement were reviewed in detail with the patient. Please refer to After Visit Summary for other counseling recommendations.  2 weeks, has [facility] appt 2 days  Adam Phenix, MD

## 2015-01-06 NOTE — Progress Notes (Signed)
Ultrasound for anatomy scheduled for 02/03/2015 @ 8:30am. She opts to move genetic counseling to that day as well. MFM aware

## 2015-01-06 NOTE — Patient Instructions (Signed)
Second Trimester of Pregnancy The second trimester is from week 13 through week 28, months 4 through 6. The second trimester is often a time when you feel your best. Your body has also adjusted to being pregnant, and you begin to feel better physically. Usually, morning sickness has lessened or quit completely, you may have more energy, and you may have an increase in appetite. The second trimester is also a time when the fetus is growing rapidly. At the end of the sixth month, the fetus is about 9 inches long and weighs about 1 pounds. You will likely begin to feel the baby move (quickening) between 18 and 20 weeks of the pregnancy. BODY CHANGES Your body goes through many changes during pregnancy. The changes vary from woman to woman.   Your weight will continue to increase. You will notice your lower abdomen bulging out.  You may begin to get stretch marks on your hips, abdomen, and breasts.  You may develop headaches that can be relieved by medicines approved by your health care provider.  You may urinate more often because the fetus is pressing on your bladder.  You may develop or continue to have heartburn as a result of your pregnancy.  You may develop constipation because certain hormones are causing the muscles that push waste through your intestines to slow down.  You may develop hemorrhoids or swollen, bulging veins (varicose veins).  You may have back pain because of the weight gain and pregnancy hormones relaxing your joints between the bones in your pelvis and as a result of a shift in weight and the muscles that support your balance.  Your breasts will continue to grow and be tender.  Your gums may bleed and may be sensitive to brushing and flossing.  Dark spots or blotches (chloasma, mask of pregnancy) may develop on your face. This will likely fade after the baby is born.  A dark line from your belly button to the pubic area (linea nigra) may appear. This will likely fade  after the baby is born.  You may have changes in your hair. These can include thickening of your hair, rapid growth, and changes in texture. Some women also have hair loss during or after pregnancy, or hair that feels dry or thin. Your hair will most likely return to normal after your baby is born. WHAT TO EXPECT AT YOUR PRENATAL VISITS During a routine prenatal visit:  You will be weighed to make sure you and the fetus are growing normally.  Your blood pressure will be taken.  Your abdomen will be measured to track your baby's growth.  The fetal heartbeat will be listened to.  Any test results from the previous visit will be discussed. Your health care provider may ask you:  How you are feeling.  If you are feeling the baby move.  If you have had any abnormal symptoms, such as leaking fluid, bleeding, severe headaches, or abdominal cramping.  If you have any questions. Other tests that may be performed during your second trimester include:  Blood tests that check for:  Low iron levels (anemia).  Gestational diabetes (between 24 and 28 weeks).  Rh antibodies.  Urine tests to check for infections, diabetes, or protein in the urine.  An ultrasound to confirm the proper growth and development of the baby.  An amniocentesis to check for possible genetic problems.  Fetal screens for spina bifida and Down syndrome. HOME CARE INSTRUCTIONS   Avoid all smoking, herbs, alcohol, and unprescribed   drugs. These chemicals affect the formation and growth of the baby.  Follow your health care provider's instructions regarding medicine use. There are medicines that are either safe or unsafe to take during pregnancy.  Exercise only as directed by your health care provider. Experiencing uterine cramps is a good sign to stop exercising.  Continue to eat regular, healthy meals.  Wear a good support bra for breast tenderness.  Do not use hot tubs, steam rooms, or saunas.  Wear your  seat belt at all times when driving.  Avoid raw meat, uncooked cheese, cat litter boxes, and soil used by cats. These carry germs that can cause birth defects in the baby.  Take your prenatal vitamins.  Try taking a stool softener (if your health care provider approves) if you develop constipation. Eat more high-fiber foods, such as fresh vegetables or fruit and whole grains. Drink plenty of fluids to keep your urine clear or pale yellow.  Take warm sitz baths to soothe any pain or discomfort caused by hemorrhoids. Use hemorrhoid cream if your health care provider approves.  If you develop varicose veins, wear support hose. Elevate your feet for 15 minutes, 3-4 times a day. Limit salt in your diet.  Avoid heavy lifting, wear low heel shoes, and practice good posture.  Rest with your legs elevated if you have leg cramps or low back pain.  Visit your dentist if you have not gone yet during your pregnancy. Use a soft toothbrush to brush your teeth and be gentle when you floss.  A sexual relationship may be continued unless your health care provider directs you otherwise.  Continue to go to all your prenatal visits as directed by your health care provider. SEEK MEDICAL CARE IF:   You have dizziness.  You have mild pelvic cramps, pelvic pressure, or nagging pain in the abdominal area.  You have persistent nausea, vomiting, or diarrhea.  You have a bad smelling vaginal discharge.  You have pain with urination. SEEK IMMEDIATE MEDICAL CARE IF:   You have a fever.  You are leaking fluid from your vagina.  You have spotting or bleeding from your vagina.  You have severe abdominal cramping or pain.  You have rapid weight gain or loss.  You have shortness of breath with chest pain.  You notice sudden or extreme swelling of your face, hands, ankles, feet, or legs.  You have not felt your baby move in over an hour.  You have severe headaches that do not go away with  medicine.  You have vision changes. Document Released: 04/06/2001 Document Revised: 04/17/2013 Document Reviewed: 06/13/2012 [employer] Patient Information 2015 [employer]. This information is not intended to replace advice given to you by your health care provider. Make sure you discuss any questions you have with your health care provider.  

## 2015-01-06 NOTE — Progress Notes (Signed)
Breastfeeding tip of the week reviewed Schedule anatomy ultrasound Ketones: trace, Hgb: trace, Leukocytes: small

## 2015-01-08 ENCOUNTER — Ambulatory Visit (HOSPITAL_COMMUNITY): Payer: Medicaid Other

## 2015-01-08 ENCOUNTER — Encounter (HOSPITAL_COMMUNITY): Payer: Medicaid Other

## 2015-01-08 ENCOUNTER — Encounter: Payer: Self-pay | Admitting: Obstetrics & Gynecology

## 2015-01-08 DIAGNOSIS — F1290 Cannabis use, unspecified, uncomplicated: Secondary | ICD-10-CM | POA: Insufficient documentation

## 2015-01-10 ENCOUNTER — Encounter: Payer: Self-pay | Admitting: Obstetrics & Gynecology

## 2015-01-10 DIAGNOSIS — O0990 Supervision of high risk pregnancy, unspecified, unspecified trimester: Secondary | ICD-10-CM | POA: Insufficient documentation

## 2015-01-20 ENCOUNTER — Encounter: Payer: Self-pay | Admitting: Family Medicine

## 2015-01-27 ENCOUNTER — Ambulatory Visit (INDEPENDENT_AMBULATORY_CARE_PROVIDER_SITE_OTHER): Payer: Medicaid Other | Admitting: Obstetrics and Gynecology

## 2015-01-27 ENCOUNTER — Encounter: Payer: Self-pay | Admitting: Obstetrics and Gynecology

## 2015-01-27 VITALS — BP 118/68 | HR 98 | Wt 167.9 lb

## 2015-01-27 DIAGNOSIS — O24912 Unspecified diabetes mellitus in pregnancy, second trimester: Secondary | ICD-10-CM

## 2015-01-27 DIAGNOSIS — O0992 Supervision of high risk pregnancy, unspecified, second trimester: Secondary | ICD-10-CM | POA: Diagnosis not present

## 2015-01-27 DIAGNOSIS — O34219 Maternal care for unspecified type scar from previous cesarean delivery: Secondary | ICD-10-CM

## 2015-01-27 DIAGNOSIS — O360120 Maternal care for anti-D [Rh] antibodies, second trimester, not applicable or unspecified: Secondary | ICD-10-CM | POA: Diagnosis not present

## 2015-01-27 DIAGNOSIS — E1165 Type 2 diabetes mellitus with hyperglycemia: Secondary | ICD-10-CM

## 2015-01-27 DIAGNOSIS — O26899 Other specified pregnancy related conditions, unspecified trimester: Secondary | ICD-10-CM

## 2015-01-27 DIAGNOSIS — Z6791 Unspecified blood type, Rh negative: Secondary | ICD-10-CM | POA: Insufficient documentation

## 2015-01-27 LAB — POCT URINALYSIS DIP (DEVICE)
BILIRUBIN URINE: NEGATIVE
Glucose, UA: 500 mg/dL — AB
Hgb urine dipstick: NEGATIVE
Ketones, ur: 80 mg/dL — AB
NITRITE: NEGATIVE
Protein, ur: NEGATIVE mg/dL
Specific Gravity, Urine: 1.025 (ref 1.005–1.030)
Urobilinogen, UA: 0.2 mg/dL (ref 0.0–1.0)
pH: 6 (ref 5.0–8.0)

## 2015-01-27 MED ORDER — FAMOTIDINE 20 MG PO TABS: 20.0000 mg | ORAL_TABLET | Freq: Two times a day (BID) | ORAL | Status: DC

## 2015-01-27 NOTE — Progress Notes (Signed)
Subjective:  Cheryl Parker is a 34 y.o. G3P2002 at [redacted]w[redacted]d being seen today for ongoing prenatal care.  Patient reports no complaints.  Contractions: Not present.  Vag. Bleeding: None. Movement: Present. Denies leaking of fluid.   The following portions of the patient's history were reviewed and updated as appropriate: allergies, current medications, past family history, past medical history, past social history, past surgical history and problem list.   Objective:   Filed Vitals:   01/27/15 1018  BP: 118/68  Pulse: 98  Weight: 167 lb 14.4 oz (76.159 kg)    Fetal Status: Fetal Heart Rate (bpm): 148   Movement: Present     General:  Alert, oriented and cooperative. Patient is in no acute distress.  Skin: Skin is warm and dry. No rash noted.   Cardiovascular: Normal heart rate noted  Respiratory: Normal respiratory effort, no problems with respiration noted  Abdomen: Soft, gravid, appropriate for gestational age. Pain/Pressure: Absent     Pelvic: Vag. Bleeding: None     Cervical exam deferred        Extremities: Normal range of motion.  Edema: None  Mental Status: Normal mood and affect. Normal behavior. Normal judgment and thought content.   Urinalysis:      Assessment and Plan:  Pregnancy: G3P2002 at [redacted]w[redacted]d  1. Type 2 diabetes mellitus with hyperglycemia, unspecified long term insulin use status (HCC)   2. Supervision of high-risk pregnancy, second trimester Patient is doing well, reports a lot of heart burn- Rx pepcid provided Plans on having Panorama with MFM on 02/03/2015  3. Previous cesarean section complicating pregnancy, antepartum condition or complication   4. Diabetes mellitus in pregnancy, antepartum, second trimester Patient has not checked CBG for a week prior to her appointment. Based on last few days CBG log, fasting as high as 150 and 2 hr pp as high as 312 Will increase Novolog to 16 with each meal and NPH 14 BID Discussed portion control and adhering to the  diet Discussed increasing exercise regimen Follow up anatomy on 10/10  5. Rh negative, antepartum, second trimester, not applicable or unspecified fetus Will need rhogam  General obstetric precautions including but not limited to vaginal bleeding, contractions, leaking of fluid and fetal movement were reviewed in detail with the patient. Please refer to After Visit Summary for other counseling recommendations.  Return in about 2 weeks (around 02/10/2015).   Catalina Antigua, MD

## 2015-02-03 ENCOUNTER — Ambulatory Visit (HOSPITAL_COMMUNITY): Admission: RE | Admit: 2015-02-03 | Payer: Medicaid Other | Source: Ambulatory Visit

## 2015-02-03 ENCOUNTER — Ambulatory Visit (HOSPITAL_COMMUNITY)
Admission: RE | Admit: 2015-02-03 | Discharge: 2015-02-03 | Disposition: A | Discharge status: Home / Self Care | Payer: Medicaid Other | Source: Ambulatory Visit | Attending: Obstetrics & Gynecology | Admitting: Obstetrics & Gynecology

## 2015-02-03 ENCOUNTER — Other Ambulatory Visit: Payer: Self-pay | Admitting: Obstetrics & Gynecology

## 2015-02-03 ENCOUNTER — Encounter (HOSPITAL_COMMUNITY): Payer: Self-pay

## 2015-02-03 DIAGNOSIS — Z315 Encounter for genetic counseling: Secondary | ICD-10-CM | POA: Insufficient documentation

## 2015-02-03 DIAGNOSIS — O24312 Unspecified pre-existing diabetes mellitus in pregnancy, second trimester: Secondary | ICD-10-CM

## 2015-02-03 DIAGNOSIS — O09522 Supervision of elderly multigravida, second trimester: Secondary | ICD-10-CM | POA: Diagnosis not present

## 2015-02-03 DIAGNOSIS — O34219 Maternal care for unspecified type scar from previous cesarean delivery: Secondary | ICD-10-CM

## 2015-02-03 DIAGNOSIS — Z3A18 18 weeks gestation of pregnancy: Secondary | ICD-10-CM | POA: Diagnosis not present

## 2015-02-03 DIAGNOSIS — O09529 Supervision of elderly multigravida, unspecified trimester: Secondary | ICD-10-CM | POA: Insufficient documentation

## 2015-02-03 DIAGNOSIS — Z3689 Encounter for other specified antenatal screening: Secondary | ICD-10-CM

## 2015-02-03 DIAGNOSIS — O360120 Maternal care for anti-D [Rh] antibodies, second trimester, not applicable or unspecified: Secondary | ICD-10-CM | POA: Diagnosis not present

## 2015-02-03 DIAGNOSIS — Z3492 Encounter for supervision of normal pregnancy, unspecified, second trimester: Secondary | ICD-10-CM

## 2015-02-03 DIAGNOSIS — Z36 Encounter for antenatal screening of mother: Secondary | ICD-10-CM | POA: Insufficient documentation

## 2015-02-03 DIAGNOSIS — O352XX0 Maternal care for (suspected) hereditary disease in fetus, not applicable or unspecified: Secondary | ICD-10-CM | POA: Insufficient documentation

## 2015-02-03 NOTE — Progress Notes (Signed)
Genetic Counseling  High-Risk Gestation Note  Appointment Date:  02/03/2015 Referred By: Adam Phenix, MD Date of Birth:  [birth year]   Pregnancy History: [id number] Estimated Date of Delivery: 07/02/15 Estimated Gestational Age: [redacted]w[redacted]d Attending: Particia Nearing, MD   Ms. Cheryl Parker was seen for genetic counseling because of a maternal age of 34 y.o..  She will be 34 years old at delivery.   In Summary:  Reviewed maternal age related 1 in 73 risk for fetal aneuploidy  Detailed ultrasound performed today and within normal limits  After detailed discussion, patient declined additional screening or testing for fetal chromosome conditions  Patient with diabetes; Fetal echocardiogram being facilitated  Previous son with unilateral renal agenesis   She was counseled regarding maternal age and the association with risk for chromosome conditions due to nondisjunction with aging of the ova.  We reviewed chromosomes, nondisjunction, and the associated 1 in 141 risk for fetal aneuploidy at [redacted]w[redacted]d gestation related to a maternal age of 34 years old at delivery.  She was counseled that the risk for aneuploidy decreases as gestational age increases, accounting for those pregnancies which spontaneously abort.  We specifically discussed Down syndrome (trisomy 9), trisomies 80 and 51, and sex chromosome aneuploidies (47,XXX and 47,XXY) including the common features and prognoses of each.   We reviewed available screening options including Quad screen, noninvasive prenatal screening (NIPS)/prenatal cell free DNA (cfDNA) testing, and detailed ultrasound.  She was counseled that screening tests are used to modify a patient's a priori risk for aneuploidy, typically based on age. This estimate provides a pregnancy specific risk assessment. We reviewed the benefits and limitations of each option. Specifically, we discussed the conditions for which each test screens, the detection rates, and false positive  rates of each. She was also counseled regarding diagnostic testing via amniocentesis. We reviewed the approximate 1 in 300-500 risk for complications for amniocentesis, including spontaneous pregnancy loss.   A detailed ultrasound was performed today. The ultrasound report will be sent under separate cover. There were no visualized fetal anomalies or markers suggestive of aneuploidy.   After consideration of all the options, Cheryl Parker declined further screening and testing for fetal chromosome conditions including Quad screen, NIPS, and amniocentesis. She stated that she was initially concerned about these conditions given that she was unaware of the pregnancy in the beginning. She later stated that the presence of one of these conditions would not alter the course of her pregnancy for her and she would prefer to find this information postnatally, if present.  She understands that screening tests cannot rule out all birth defects or genetic syndromes. The patient was advised of this limitation and states she still does not want additional testing at this time.   Cheryl Parker was provided with written information regarding sickle cell anemia (SCA) including the carrier frequency and incidence in the Hispanic/African-American population, the availability of carrier testing and prenatal diagnosis if indicated.  In addition, we discussed that hemoglobinopathies are routinely screened for as part of the [place] newborn screening panel. Hemoglobin electrophoresis is available to the patient if desired and if not previously performed.  Both family histories were reviewed and found to be contributory for unilateral renal agenesis in the patient's previous son. He is currently 52 years old and is followed by pediatric urology at [facility]. The patient reported that he currently has normal kidney function. No additional relatives were reported with congenital renal anomalies. Cheryl Parker stated that  she and the  father of her children have both of their kidneys. 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 Remaining visualized fetal anatomy appeared normal. Complete ultrasound results reported separately.   Renal agenesis is typically sporadic. However, there are familial cases reported that are consistent with autosomal dominant inheritance. Additionally, prenatal exposures such as warfarin, cocaine, and maternal diabetes have been reported to be associated with renal agenesis. Cheryl Parker had diabetes during her previous pregnancy.  We also discussed that renal agenesis is described as an underlying feature of many single gene conditions and can also be seen with chromosome conditions. Specifically, renal agenesis has been described as a feature of two birth defects associations: VACTERL and MURCS. The patient reported no additional features for her son suggestive of a particular syndrome. We discussed that in the case of isolated renal agenesis, recurrence risk is likely low, given that sporadic occurrence is observed in the majority of cases. However, given that autosomal dominant inheritance has been observed in families, renal imaging would be available to Cheryl Parker and the father of the pregnancy, if not previously performed, in order to more accurately determine recurrence risk assessment for relatives. Without further information regarding the provided family history, an accurate genetic risk cannot be calculated. Further genetic counseling is warranted if more information is obtained.  Cheryl Parker denied exposure to environmental toxins or chemical agents. She denied the use of alcohol or street drugs. She reported smoking 3-4 cigarettes per day prior to being aware of the pregnancy. She reported that she has discontinued smoking cigarettes. She denied significant viral illnesses during the course of her pregnancy. Her medical and surgical histories  were contributory for diabetes, for which she is currently taking medication. We discussed the fact that women who have insulin dependent diabetes are at an increased risk to have a baby with a birth defect.  The increase in risk correlates with the level of blood sugar control during the pregnancy, particularly during organogenesis.  The increase in risk is for any type of birth defect but is greatest for heart, limb, and neural tube defects.  The risk could be as high as 6-10% for individuals whose blood sugars are not well-controlled, but lower for women who have good blood sugar control throughout pregnancy. Fetal echocardiogram is being scheduled for the patient. Follow-up ultrasound was scheduled in 6 weeks.    I counseled Cheryl Parker regarding the above risks and available options.  The approximate face-to-face time with the genetic counselor was 40 minutes.  Quinn Plowman, MS,  Certified Genetic Counselor 02/03/2015

## 2015-02-10 ENCOUNTER — Telehealth: Payer: Self-pay | Admitting: *Deleted

## 2015-02-10 ENCOUNTER — Ambulatory Visit (INDEPENDENT_AMBULATORY_CARE_PROVIDER_SITE_OTHER): Payer: Medicaid Other | Admitting: Obstetrics and Gynecology

## 2015-02-10 ENCOUNTER — Inpatient Hospital Stay (HOSPITAL_COMMUNITY)
Admission: AD | Admit: 2015-02-10 | Discharge: 2015-02-10 | Disposition: A | Discharge status: Home / Self Care | Payer: Medicaid Other | Source: Ambulatory Visit | Attending: Family Medicine | Admitting: Family Medicine

## 2015-02-10 ENCOUNTER — Encounter (HOSPITAL_COMMUNITY): Payer: Self-pay | Admitting: *Deleted

## 2015-02-10 ENCOUNTER — Encounter: Payer: Medicaid Other | Attending: Obstetrics & Gynecology | Admitting: *Deleted

## 2015-02-10 VITALS — BP 111/70 | HR 108 | Temp 98.2°F | Wt 168.9 lb

## 2015-02-10 DIAGNOSIS — R112 Nausea with vomiting, unspecified: Secondary | ICD-10-CM | POA: Diagnosis present

## 2015-02-10 DIAGNOSIS — O210 Mild hyperemesis gravidarum: Secondary | ICD-10-CM | POA: Diagnosis present

## 2015-02-10 DIAGNOSIS — O24919 Unspecified diabetes mellitus in pregnancy, unspecified trimester: Secondary | ICD-10-CM

## 2015-02-10 DIAGNOSIS — O211 Hyperemesis gravidarum with metabolic disturbance: Secondary | ICD-10-CM | POA: Diagnosis not present

## 2015-02-10 DIAGNOSIS — Z7982 Long term (current) use of aspirin: Secondary | ICD-10-CM | POA: Insufficient documentation

## 2015-02-10 DIAGNOSIS — Z713 Dietary counseling and surveillance: Secondary | ICD-10-CM | POA: Diagnosis not present

## 2015-02-10 DIAGNOSIS — Z87891 Personal history of nicotine dependence: Secondary | ICD-10-CM | POA: Insufficient documentation

## 2015-02-10 DIAGNOSIS — O26892 Other specified pregnancy related conditions, second trimester: Secondary | ICD-10-CM | POA: Insufficient documentation

## 2015-02-10 DIAGNOSIS — O360120 Maternal care for anti-D [Rh] antibodies, second trimester, not applicable or unspecified: Secondary | ICD-10-CM

## 2015-02-10 DIAGNOSIS — O219 Vomiting of pregnancy, unspecified: Secondary | ICD-10-CM

## 2015-02-10 DIAGNOSIS — R739 Hyperglycemia, unspecified: Secondary | ICD-10-CM | POA: Insufficient documentation

## 2015-02-10 DIAGNOSIS — Z3A19 19 weeks gestation of pregnancy: Secondary | ICD-10-CM | POA: Diagnosis not present

## 2015-02-10 DIAGNOSIS — O0992 Supervision of high risk pregnancy, unspecified, second trimester: Secondary | ICD-10-CM

## 2015-02-10 DIAGNOSIS — O24419 Gestational diabetes mellitus in pregnancy, unspecified control: Secondary | ICD-10-CM

## 2015-02-10 DIAGNOSIS — O34219 Maternal care for unspecified type scar from previous cesarean delivery: Secondary | ICD-10-CM

## 2015-02-10 DIAGNOSIS — E1165 Type 2 diabetes mellitus with hyperglycemia: Secondary | ICD-10-CM | POA: Diagnosis present

## 2015-02-10 DIAGNOSIS — O0990 Supervision of high risk pregnancy, unspecified, unspecified trimester: Secondary | ICD-10-CM

## 2015-02-10 DIAGNOSIS — O24912 Unspecified diabetes mellitus in pregnancy, second trimester: Secondary | ICD-10-CM | POA: Diagnosis not present

## 2015-02-10 LAB — COMPREHENSIVE METABOLIC PANEL
ALBUMIN: 3.2 g/dL — AB (ref 3.5–5.0)
ALT: 16 U/L (ref 14–54)
ANION GAP: 3 — AB (ref 5–15)
AST: 37 U/L (ref 15–41)
Alkaline Phosphatase: 70 U/L (ref 38–126)
BILIRUBIN TOTAL: 0.3 mg/dL (ref 0.3–1.2)
BUN: 5 mg/dL — AB (ref 6–20)
CO2: 24 mmol/L (ref 22–32)
Calcium: 8.6 mg/dL — ABNORMAL LOW (ref 8.9–10.3)
Chloride: 106 mmol/L (ref 101–111)
Creatinine, Ser: 0.53 mg/dL (ref 0.44–1.00)
GFR calc Af Amer: >60 mL/min (ref >60)
GFR calc non Af Amer: >60 mL/min (ref >60)
GLUCOSE: 172 mg/dL — AB (ref 65–99)
POTASSIUM: 3.8 mmol/L (ref 3.5–5.1)
SODIUM: 133 mmol/L — AB (ref 135–145)
Total Protein: 6.8 g/dL (ref 6.5–8.1)

## 2015-02-10 LAB — CBC WITH DIFFERENTIAL/PLATELET
BASOS ABS: 0 10*3/uL (ref 0.0–0.1)
BASOS PCT: 0 %
EOS ABS: 0 10*3/uL (ref 0.0–0.7)
Eosinophils Relative: 0 %
HEMATOCRIT: 35.9 % — AB (ref 36.0–46.0)
HEMOGLOBIN: 12 g/dL (ref 12.0–15.0)
Lymphocytes Relative: 18 %
Lymphs Abs: 1.7 10*3/uL (ref 0.7–4.0)
MCH: 32.2 pg (ref 26.0–34.0)
MCHC: 33.4 g/dL (ref 30.0–36.0)
MCV: 96.2 fL (ref 78.0–100.0)
MONO ABS: 0.6 10*3/uL (ref 0.1–1.0)
Monocytes Relative: 6 %
NEUTROS ABS: 7.3 10*3/uL (ref 1.7–7.7)
NEUTROS PCT: 76 %
Platelets: 293 10*3/uL (ref 150–400)
RBC: 3.73 MIL/uL — ABNORMAL LOW (ref 3.87–5.11)
RDW: 12.7 % (ref 11.5–15.5)
WBC: 9.7 10*3/uL (ref 4.0–10.5)

## 2015-02-10 LAB — GLUCOSE, CAPILLARY
GLUCOSE-CAPILLARY: 97 mg/dL (ref 65–99)
Glucose-Capillary: 190 mg/dL — ABNORMAL HIGH (ref 65–99)
Glucose-Capillary: 165 mg/dL — ABNORMAL HIGH (ref 65–99)
Glucose-Capillary: 86 mg/dL (ref 65–99)

## 2015-02-10 LAB — POCT URINALYSIS DIP (DEVICE)
BILIRUBIN URINE: NEGATIVE
Glucose, UA: 500 mg/dL — AB
HGB URINE DIPSTICK: NEGATIVE
LEUKOCYTES UA: NEGATIVE
Nitrite: NEGATIVE
Protein, ur: NEGATIVE mg/dL
Urobilinogen, UA: 0.2 mg/dL (ref 0.0–1.0)
pH: 5.5 (ref 5.0–8.0)

## 2015-02-10 MED ORDER — ONDANSETRON HCL 4 MG/2ML IJ SOLN
4.0000 mg | Freq: Once | INTRAMUSCULAR | Status: AC
  Administered 2015-02-10: 4 mg via INTRAVENOUS
  Filled 2015-02-10: qty 2

## 2015-02-10 MED ORDER — ACCU-CHEK FASTCLIX LANCETS MISC: 1.0000 | Freq: Four times a day (QID) | Status: DC

## 2015-02-10 MED ORDER — ONDANSETRON 4 MG PO TBDP: 4.0000 mg | ORAL_TABLET | Freq: Four times a day (QID) | ORAL | Status: DC | PRN

## 2015-02-10 MED ORDER — GLUCOSE BLOOD VI STRP: ORAL_STRIP | Status: DC

## 2015-02-10 MED ORDER — INSULIN ASPART 100 UNIT/ML ~~LOC~~ SOLN: SUBCUTANEOUS | Status: DC

## 2015-02-10 MED ORDER — SODIUM CHLORIDE 0.9 % IV BOLUS (SEPSIS)
1000.0000 mL | Freq: Once | INTRAVENOUS | Status: AC
  Administered 2015-02-10 (×2): 1000 mL via INTRAVENOUS

## 2015-02-10 MED ORDER — INSULIN NPH (HUMAN) (ISOPHANE) 100 UNIT/ML ~~LOC~~ SUSP: SUBCUTANEOUS | Status: DC

## 2015-02-10 NOTE — Discharge Instructions (Signed)
Continue to take your insulin ---> both your basal/cloudy insulin (NPH) and your mealtime insulin (Humalog)  Use the Zofran as needed to support your eating and drinking  Eat small frequent meals  Try to eat bland foods  Return to the MAU if you are unable to keep down food, your sugar is > 170 despite taking your insulin, you are not peeing often, or any other concern.     Hyperglycemia Hyperglycemia occurs when the glucose (sugar) in your blood is too high. Hyperglycemia can happen for many reasons, but it most often happens to people who do not know they have diabetes or are not managing their diabetes properly.  CAUSES  Whether you have diabetes or not, there are other causes of hyperglycemia. Hyperglycemia can occur when you have diabetes, but it can also occur in other situations that you might not be as aware of, such as: Diabetes  If you have diabetes and are having problems controlling your blood glucose, hyperglycemia could occur because of some of the following reasons:  Not following your meal plan.  Not taking your diabetes medications or not taking it properly.  Exercising less or doing less activity than you normally do.  Being sick. Pre-diabetes  This cannot be ignored. Before people develop Type 2 diabetes, they almost always have "pre-diabetes." This is when your blood glucose levels are higher than normal, but not yet high enough to be diagnosed as diabetes. Research has shown that some long-term damage to the body, especially the heart and circulatory system, may already be occurring during pre-diabetes. If you take action to manage your blood glucose when you have pre-diabetes, you may delay or prevent Type 2 diabetes from developing. Stress  If you have diabetes, you may be "diet" controlled or on oral medications or insulin to control your diabetes. However, you may find that your blood glucose is higher than usual in the hospital whether you have diabetes or  not. This is often referred to as "stress hyperglycemia." Stress can elevate your blood glucose. This happens because of hormones put out by the body during times of stress. If stress has been the cause of your high blood glucose, it can be followed regularly by your caregiver. That way he/she can make sure your hyperglycemia does not continue to get worse or progress to diabetes. Steroids  Steroids are medications that act on the infection fighting system (immune system) to block inflammation or infection. One side effect can be a rise in blood glucose. Most people can produce enough extra insulin to allow for this rise, but for those who cannot, steroids make blood glucose levels go even higher. It is not unusual for steroid treatments to "uncover" diabetes that is developing. It is not always possible to determine if the hyperglycemia will go away after the steroids are stopped. A special blood test called an A1c is sometimes done to determine if your blood glucose was elevated before the steroids were started. SYMPTOMS  Thirsty.  Frequent urination.  Dry mouth.  Blurred vision.  Tired or fatigue.  Weakness.  Sleepy.  Tingling in feet or leg. DIAGNOSIS  Diagnosis is made by monitoring blood glucose in one or all of the following ways:  A1c test. This is a chemical found in your blood.  Fingerstick blood glucose monitoring.  Laboratory results. TREATMENT  First, knowing the cause of the hyperglycemia is important before the hyperglycemia can be treated. Treatment may include, but is not be limited to:  Education.  Change or adjustment in medications.  Change or adjustment in meal plan.  Treatment for an illness, infection, etc.  More frequent blood glucose monitoring.  Change in exercise plan.  Decreasing or stopping steroids.  Lifestyle changes. HOME CARE INSTRUCTIONS   Test your blood glucose as directed.  Exercise regularly. Your caregiver will give you  instructions about exercise. Pre-diabetes or diabetes which comes on with stress is helped by exercising.  Eat wholesome, balanced meals. Eat often and at regular, fixed times. Your caregiver or nutritionist will give you a meal plan to guide your sugar intake.  Being at an ideal weight is important. If needed, losing as little as 10 to 15 pounds may help improve blood glucose levels. SEEK MEDICAL CARE IF:   You have questions about medicine, activity, or diet.  You continue to have symptoms (problems such as increased thirst, urination, or weight gain). SEEK IMMEDIATE MEDICAL CARE IF:   You are vomiting or have diarrhea.  Your breath smells fruity.  You are breathing faster or slower.  You are very sleepy or incoherent.  You have numbness, tingling, or pain in your feet or hands.  You have chest pain.  Your symptoms get worse even though you have been following your caregiver's orders.  If you have any other questions or concerns.   This information is not intended to replace advice given to you by your health care provider. Make sure you discuss any questions you have with your health care provider.   Document Released: 10/06/2000 Document Revised: 07/05/2011 Document Reviewed: 12/17/2014 Elsevier Interactive Patient Education [employer]

## 2015-02-10 NOTE — MAU Note (Signed)
Pt states she started having vomiting & diarrhea since Saturday, unable to keep down solids or liquids, denies fever.  C/O upper abd pain, denies bleeding.

## 2015-02-10 NOTE — Progress Notes (Signed)
States doesn't feel good. Has been having nausea and vomiting and diarrhea since 02/08/15. States last ate yesterday after breakfast. States after vomiting having pains in stomach and back. Feels baby moving but feels like just feels arms legs move not whole body.

## 2015-02-10 NOTE — MAU Provider Note (Signed)
History    CSN: [id number] Arrival date and time: 02/10/15 1019 First Provider Initiated Contact with Patient 02/10/15 1036    Chief Complaint  Patient presents with  . Hyperglycemia   HPI  Patient is 34 y.o. [id number] 31w5dhere with complaints of nausea, vomitting, diarrhea and elevated BG in clinic today. Patient reports she was in her normal state of health until Saturday when she started having diarrhea.  She reports vomiting since Sunday. Last food intake was Sunday night. Reports diarrhea, watery since Saturday, non bloody, no recent abx. No other sick contacts. Denies fevers, chills. Denies dysuria. Denies any wounds that might be infected. Denies productive cough, denies cough in general. Repots mild SOB since the beginning of pregnancy.  Patient reports taking both of her types of insulin (cloudy and clear- per patient's own words).   +FM, denies LOF, VB, contractions, vaginal discharge.   OB History    Gravida Para Term Preterm AB TAB SAB Ectopic Multiple Living   '3 2 2 ' 0 0 0 0 0 0 2      Past Medical History  Diagnosis Date  . Hypertension   . Pregnancy induced hypertension   . 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 DKA (diabetic ketoacidoses) ([facility] 09/10/2014    Past Surgical History  Procedure Laterality Date  . Cesarean section    . Cesarean section N/A 07/05/2012    Procedure: CESAREAN SECTION;  Surgeon: PMora Bellman MD;  [facility];  Service: Obstetrics;  Laterality: N/A;    Family History  Problem Relation Age of Onset  . Diabetes Mother   . Kidney disease Father   . Birth defects Son     unilateral renal agenesis    Social History  Substance Use Topics  . Smoking status: Former SResearch scientist (life sciences) . Smokeless tobacco: Never Used  . Alcohol Use: No    Allergies: No Known Allergies  Prescriptions prior to admission  Medication Sig Dispense Refill Last Dose  . acetaminophen (TYLENOL) 325 MG tablet Take 650 mg by mouth  every 6 (six) hours as needed for mild pain.   02/09/2015 at Unknown time  . aspirin 81 MG chewable tablet Chew 1 tablet (81 mg total) by mouth daily. 30 tablet 0 Past Month at Unknown time  . famotidine (PEPCID) 20 MG tablet Take 1 tablet (20 mg total) by mouth 2 (two) times daily. 60 tablet 6 02/09/2015 at Unknown time  . insulin aspart (NOVOLOG) 100 UNIT/ML injection Inject 16 units SQ before each meal 10 mL 10 02/10/2015 at Unknown time  . insulin NPH Human (HUMULIN N,NOVOLIN N) 100 UNIT/ML injection Inject 14 units subcutaneously before breakfast and 19 units at bedtime 10 mL 10 02/10/2015 at Unknown time  . Prenatal Vit-Fe Fumarate-FA (PRENATAL MULTIVITAMIN) TABS tablet Take 1 tablet by mouth daily at 12 noon. 30 tablet 11 Past Month at Unknown time  . promethazine (PHENERGAN) 25 MG suppository Place 1 suppository (25 mg total) rectally every 6 (six) hours as needed for nausea or vomiting. 30 each 1 Past Week at Unknown time  . Blood Glucose Monitoring Suppl (ACCU-CHEK NANO SMARTVIEW) W/DEVICE KIT 1 Device by Does not apply route 4 (four) times daily. 1 kit 0 Taking  . Insulin Syringe-Needle U-100 (INSULIN SYRINGE .3CC/31GX5/16") 31G X 5/16" 0.3 ML MISC 1 each by Does not apply route 5 (five) times daily. DX GDM O24.419 for injecting insulin 5 times daily 100 each 10 Taking    Review of Systems  Constitutional: Negative for fever and chills.  Eyes: Negative for blurred vision and double vision.  Respiratory: Positive for shortness of breath. Negative for cough.   Cardiovascular: Negative for chest pain and orthopnea.  Gastrointestinal: Negative for nausea and vomiting.  Genitourinary: Negative for dysuria, frequency and flank pain.  Musculoskeletal: Negative for myalgias and back pain.  Skin: Negative for rash.  Neurological: Negative for dizziness, tingling, weakness and headaches.  Endo/Heme/Allergies: Does not bruise/bleed easily.  Psychiatric/Behavioral: Negative for depression and  suicidal ideas. The patient is not nervous/anxious.    Physical Exam   Blood pressure 118/75, pulse 101, temperature 98.8 F (37.1 C), temperature source Oral, resp. rate 20, last menstrual period 09/25/2014, SpO2 100 %.  Physical Exam  Nursing note and vitals reviewed. Constitutional: She is oriented to person, place, and time. She appears well-developed and well-nourished. No distress.  Pregnant female. Sick but not toxic appearing. Somewhat withdrawn. Not actively vomiting.   HENT:  Head: Normocephalic and atraumatic.  Eyes: Conjunctivae are normal. No scleral icterus.  Neck: Normal range of motion. Neck supple.  Cardiovascular: Normal rate and intact distal pulses.   Respiratory: Effort normal. She exhibits no tenderness.  GI: Soft. There is no tenderness. There is no rebound and no guarding.  Gravid. No CVA tenderness.   Genitourinary: Vagina normal.  Musculoskeletal: Normal range of motion. She exhibits no edema.  Neurological: She is alert and oriented to person, place, and time.  Skin: Skin is warm and dry. No rash noted.  Psychiatric: She has a normal mood and affect.    MAU Course  Procedures  MDM CMP- bicarb 24, AG 3 CBG- 190, 165, 172, 86, 97 CBC- wbc 9.7, hgb 12.0 UA- glucose >500, >160 keytones. Negative LE/HGB S/p 2 L NS bolus.   Assessment and Plan  Cheryl Parker is 34 y.o. G3P2002 at 25w5dpresenting with concern for DKA from clinic given elevated blood glucose and keytones on UA.  Work up in MAU does not support acidosis but patient does have evidence of hyperglycemia and dehydration -likely related to viral gastroenteritis as no other infectious source identified. Unlikely serious bacterial infection given WBC wnl and UA non-infectious appearing.   #Hyperglycemia: Bicarb normal and AG normal. Patient received 2L NS and BG lowered appropriately  #Nausea/Vomitting, likely gastroenteritis: Patient has PO challenge in MAU and tolerated this well. She will be  given zofran for prn use to help support PO intake. Instructed to increase fluid intake and eat small bland meals. She voiced understanding that inability to eat/drink without vomiting would mean she should return to MAU.   Follow up in 1 week with HKensal10/17/2016, 2:28 PM

## 2015-02-10 NOTE — Progress Notes (Signed)
Patient present for review of glucose readings. She is not logging numbers. Nor testing 4 times daily. In review of her glucometer FBS range 181-213mg /dl, 2hpp or whenever she doesn't feel fell she will test with readings from 89-239mg /dl. Patient states she ran out of test strips and was not able to test ad instructed.

## 2015-02-10 NOTE — Progress Notes (Signed)
Subjective:  Cheryl Parker is a 34 y.o. G3P2002 at 8426w5d being seen today for ongoing prenatal care.  Patient reports vomiting and diarrhea. For last 2.5 days. Has had n/v this pregnancy, none though for the past 3 weeks. Having epigastric abdominal pain as well. Getting worse. Urinating, tolerating some fluids. Random glucose right now is 190. Hasn't been recording 4 times a day. Reviewing meter. Hasn't been to ophtho  Contractions: Not present.  Vag. Bleeding: None. Movement: Present. Denies leaking of fluid.   The following portions of the patient's history were reviewed and updated as appropriate: allergies, current medications, past family history, past medical history, past social history, past surgical history and problem list. Problem list updated.  Objective:   Filed Vitals:   02/10/15 0906  BP: 111/70  Pulse: 108  Temp: 98.2 F (36.8 C)  Weight: 168 lb 14.4 oz (76.613 kg)    Fetal Status: Fetal Heart Rate (bpm): 150   Movement: Present     General:  Alert, oriented and cooperative. Patient is in no acute distress.  Skin: Skin is warm and dry. No rash noted.   Cardiovascular: Normal heart rate noted  Respiratory: Normal respiratory effort, no problems with respiration noted  Abdomen: Soft, gravid, appropriate for gestational age. ttp epigastrum. Pain/Pressure: Present     Pelvic: Vag. Bleeding: None     Cervical exam deferred        Extremities: Normal range of motion.  Edema: None  Mental Status: Normal mood and affect. Normal behavior. Normal judgment and thought content.   Urinalysis: Urine Protein: Negative Urine Glucose: Negative  Assessment and Plan:  Pregnancy: G3P2002 at 4726w5d  1. Diabetes mellitus in pregnancy, antepartum, unspecified trimester To see our DM educator today Fastings appear to be mostly > 95, not checking 4 times daily Increase nighttime nph from 14 to 19 units, f/u diabetes educator one week Fetal echo scheduled 12/5 at [facility] - growth  scan scheduled in November - flu vaccine today  # abominal pain, nausea, vomiting - may be recurrence of n/v of pregnancy, but random glucose 190s and ketones in urine, so concern for dka - to our MAU for dka evaluation  Preterm labor symptoms and general obstetric precautions including but not limited to vaginal bleeding, contractions, leaking of fluid and fetal movement were reviewed in detail with the patient. Please refer to After Visit Summary for other counseling recommendations.  Return in about 2 weeks (around 02/24/2015).   Kathrynn RunningNoah Bedford Wouk, MD

## 2015-02-10 NOTE — Telephone Encounter (Signed)
Called patient per Dr Ashok PallWouk to let her know to increase her nighttime NPH insulin from 14 to 19 units. She also needs to see the diabetes educator next week. There was no answer. Voice mail was left asking her to return our call at the clinic regarding her medication dose.

## 2015-02-10 NOTE — Addendum Note (Signed)
Addended by: Rosendo GrosHALPIN, Railey Glad L on: 02/10/2015 12:33 PM   Modules accepted: Orders

## 2015-02-13 NOTE — Telephone Encounter (Signed)
Called patient and left message that we are calling with medication changes and appt info, please call back and ask for a nurse.

## 2015-02-17 ENCOUNTER — Encounter: Payer: Self-pay | Admitting: *Deleted

## 2015-02-17 NOTE — Telephone Encounter (Signed)
Certified letter to patient

## 2015-02-24 ENCOUNTER — Ambulatory Visit: Payer: Medicaid Other

## 2015-03-03 ENCOUNTER — Ambulatory Visit (INDEPENDENT_AMBULATORY_CARE_PROVIDER_SITE_OTHER): Payer: Medicaid Other | Admitting: Family Medicine

## 2015-03-03 ENCOUNTER — Encounter: Payer: Medicaid Other | Attending: Obstetrics & Gynecology | Admitting: *Deleted

## 2015-03-03 VITALS — BP 118/72 | HR 99 | Temp 98.3°F | Wt 172.6 lb

## 2015-03-03 DIAGNOSIS — F1210 Cannabis abuse, uncomplicated: Secondary | ICD-10-CM | POA: Diagnosis not present

## 2015-03-03 DIAGNOSIS — O99322 Drug use complicating pregnancy, second trimester: Secondary | ICD-10-CM

## 2015-03-03 DIAGNOSIS — O360120 Maternal care for anti-D [Rh] antibodies, second trimester, not applicable or unspecified: Secondary | ICD-10-CM | POA: Diagnosis not present

## 2015-03-03 DIAGNOSIS — O24912 Unspecified diabetes mellitus in pregnancy, second trimester: Secondary | ICD-10-CM | POA: Diagnosis present

## 2015-03-03 DIAGNOSIS — O24919 Unspecified diabetes mellitus in pregnancy, unspecified trimester: Secondary | ICD-10-CM | POA: Insufficient documentation

## 2015-03-03 DIAGNOSIS — F1290 Cannabis use, unspecified, uncomplicated: Secondary | ICD-10-CM

## 2015-03-03 DIAGNOSIS — Z713 Dietary counseling and surveillance: Secondary | ICD-10-CM | POA: Insufficient documentation

## 2015-03-03 DIAGNOSIS — O352XX0 Maternal care for (suspected) hereditary disease in fetus, not applicable or unspecified: Secondary | ICD-10-CM

## 2015-03-03 DIAGNOSIS — O34219 Maternal care for unspecified type scar from previous cesarean delivery: Secondary | ICD-10-CM | POA: Diagnosis not present

## 2015-03-03 DIAGNOSIS — E1165 Type 2 diabetes mellitus with hyperglycemia: Secondary | ICD-10-CM | POA: Diagnosis not present

## 2015-03-03 DIAGNOSIS — O24419 Gestational diabetes mellitus in pregnancy, unspecified control: Secondary | ICD-10-CM

## 2015-03-03 LAB — POCT URINALYSIS DIP (DEVICE)
BILIRUBIN URINE: NEGATIVE
Glucose, UA: NEGATIVE mg/dL
HGB URINE DIPSTICK: NEGATIVE
KETONES UR: NEGATIVE mg/dL
Nitrite: NEGATIVE
PH: 7.5 (ref 5.0–8.0)
Protein, ur: 30 mg/dL — AB
SPECIFIC GRAVITY, URINE: 1.02 (ref 1.005–1.030)
Urobilinogen, UA: 1 mg/dL (ref 0.0–1.0)

## 2015-03-03 MED ORDER — INSULIN ASPART 100 UNIT/ML ~~LOC~~ SOLN: SUBCUTANEOUS | Status: DC

## 2015-03-03 MED ORDER — ASPIRIN 81 MG PO CHEW: 81.0000 mg | CHEWABLE_TABLET | Freq: Every day | ORAL | Status: DC

## 2015-03-03 MED ORDER — INSULIN NPH (HUMAN) (ISOPHANE) 100 UNIT/ML ~~LOC~~ SUSP: SUBCUTANEOUS | Status: DC

## 2015-03-03 NOTE — Progress Notes (Signed)
Urinalysis shows moderate leukocytes.  

## 2015-03-03 NOTE — Progress Notes (Signed)
Subjective:  Cheryl Parker is a 34 y.o. G3P2002 at 452w5d being seen today for ongoing prenatal care.  Patient reports no complaints.  Contractions: Not present.  Vag. Bleeding: None. Movement: Present. Denies leaking of fluid.   The following portions of the patient's history were reviewed and updated as appropriate: allergies, current medications, past family history, past medical history, past social history, past surgical history and problem list. Problem list updated.  Objective:   Filed Vitals:   03/03/15 1040  BP: 118/72  Pulse: 99  Temp: 98.3 F (36.8 C)  Weight: 172 lb 9.6 oz (78.291 kg)    Fetal Status: Fetal Heart Rate (bpm): 145 Fundal Height: 24 cm Movement: Present     General:  Alert, oriented and cooperative. Patient is in no acute distress.  Skin: Skin is warm and dry. No rash noted.   Cardiovascular: Normal heart rate noted  Respiratory: Normal respiratory effort, no problems with respiration noted  Abdomen: Soft, gravid, appropriate for gestational age. Pain/Pressure: Absent     Pelvic: Vag. Bleeding: None     Cervical exam deferred        Extremities: Normal range of motion.  Edema: None  Mental Status: Normal mood and affect. Normal behavior. Normal judgment and thought content.   Urinalysis: Urine Protein: 1+ Urine Glucose: Negative  Fasting 99-192 Bfast 110-194 Lunch 119-149 Dinner 125-209  Assessment and Plan:  Pregnancy: G3P2002 at 616w5d  1. Diabetes mellitus in pregnancy, antepartum, second trimester - very poorly controlled (HA1C 8.0%) - NPH--> increased to 18 and 22u - Aspart--> increased to 18u with meals - Will need serial growth scan q 4 weeks.  - DM educator will call to discuss values in 1 week. Consider interval increase in both long acting and prandial insulin - US Fetal Echocardiography; Future - Ambulatory referral to Ophthalmology  2. Marijuana use  3. Previous cesarean section complicating pregnancy, antepartum condition or  complication -desires repeat  4. 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 Rh negative, antepartum, second trimester, not applicable or unspecified fetus - needs rhogam at 28 weeks  6. Type 2 diabetes mellitus with hyperglycemia, unspecified long term insulin use status (HCC) - US Fetal Echocardiography scheduled Dec 5th @[facility]  - Ambulatory referral to Ophthalmology  Preterm labor symptoms and general obstetric precautions including but not limited to vaginal bleeding, contractions, leaking of fluid and fetal movement were reviewed in detail with the patient. Please refer to After Visit Summary for other counseling recommendations.  Return in about 4 weeks (around 03/31/2015) for Routine prenatal care.   Federico FlakeKimberly Niles Patches Mcdonnell, MD  Future Appointments Date Time Provider Department Center  03/17/2015 9:00 AM [facility] US 1 [facility]US 203  03/17/2015 10:30 AM [facility] NURSE [facility]  03/24/2015 9:05 AM Catalina AntiguaPeggy Constant, MD [facility]

## 2015-03-03 NOTE — Patient Instructions (Addendum)
NPH 18 u in AM; 22 u in the night Novolog 18 u with every meal Schedule for eye doctor  Safe Medications in Pregnancy   Acne: Benzoyl Peroxide Salicylic Acid  Backache/Headache: Tylenol: 2 regular strength every 4 hours OR              2 Extra strength every 6 hours  Colds/Coughs/Allergies: Benadryl (alcohol free) 25 mg every 6 hours as needed Breath right strips Claritin Cepacol throat lozenges Chloraseptic throat spray Cold-Eeze- up to three times per day Cough drops, alcohol free Flonase (by prescription only) Guaifenesin Mucinex Robitussin DM (plain only, alcohol free) Saline nasal spray/drops Sudafed (pseudoephedrine) & Actifed ** use only after [redacted] weeks gestation and if you do not have high blood pressure Tylenol Vicks Vaporub Zinc lozenges Zyrtec   Constipation: Colace Ducolax suppositories Fleet enema Glycerin suppositories Metamucil Milk of magnesia Miralax Senokot Smooth move tea  Diarrhea: Kaopectate Imodium A-D  *NO pepto Bismol  Hemorrhoids: Anusol Anusol HC Preparation H Tucks  Indigestion: Tums Maalox Mylanta Zantac  Pepcid  Insomnia: Benadryl (alcohol free)  every 6 hours as needed Tylenol PM Unisom, no Gelcaps  Leg Cramps: Tums MagGel  Nausea/Vomiting:  Bonine Dramamine Emetrol Ginger extract Sea bands Meclizine  Nausea medication to take during pregnancy:  Unisom (doxylamine succinate 25 mg tablets) Take one tablet daily at bedtime. If symptoms are not adequately controlled, the dose can be increased to a maximum recommended dose of two tablets daily (1/2 tablet in the morning, 1/2 tablet mid-afternoon and one at bedtime). Vitamin B6  tablets. Take one tablet twice a day (up to 200 mg per day).  Skin Rashes: Aveeno products Benadryl cream or  every 6 hours as needed Calamine Lotion 1% cortisone cream  Yeast infection: Gyne-lotrimin 7 Monistat 7   **If taking multiple medications, please check  labels to avoid duplicating the same active ingredients **take medication as directed on the label ** Do not exceed 4000 mg of tylenol in 24 hours **Do not take medications that contain aspirin or ibuprofen      Type 1 or Type 2 Diabetes Mellitus During Pregnancy Diabetes mellitus, often simply referred to as diabetes, is a long-term (chronic) disease. Type 1 diabetes occurs when the islet cells, which are in the pancreas and make the hormone insulin, are destroyed and can no longer make insulin. Type 2 diabetes occurs when the pancreas does not make enough insulin, the cells are less responsive to the insulin that is made (insulin resistance), or both. Insulin is needed to move sugars from food into the tissue cells. The tissue cells use the sugars for energy. The lack of insulin or the lack of normal response to insulin causes excess sugars to build up in the blood instead of going into the tissue cells. As a result, high blood sugar (hyperglycemia) develops.  If blood glucose levels are kept in the normal range both before and during pregnancy, women can have a healthy pregnancy. If your blood glucose levels are not well controlled, there may be risks to you, your unborn baby, and your labor and delivery. Also, there may be risks to your baby once he or she is born.  RISK FACTORS  You are predisposed to developing type 1 diabetes if someone in your family has diabetes and you are exposed to certain environmental triggers.  You have an increased chance of developing type 2 diabetes if you have a family history of diabetes and also have one or more of the following risk  factors:  Being overweight.  Having an inactive lifestyle.  Having a history of consistently eating high-calorie foods. SYMPTOMS  Increased thirst (polydipsia).  Increased urination (polyuria).  Increased urination during the night (nocturia).  Weight loss. This weight loss may be rapid.  Frequent, recurring  infections.  Tiredness (fatigue).  Weakness.  Vision changes, such as blurred vision.  Fruity smell to your breath.  Abdominal pain.  Nausea or vomiting. DIAGNOSIS  If you have risk factors for diabetes, you may be screened for undiagnosed type 2 diabetes at your first prenatal visit. If you have previously given birth and you had gestational diabetes, you should be screened. The screening should be performed 6-12 weeks after the child is born and repeated every 1-3 years after the first test. Diabetes is diagnosed when blood glucose levels are increased. Your blood glucose level may be checked by one or more of the following blood tests:  A fasting blood glucose test. You will not be allowed to eat for at least 8 hours before a blood sample is taken.  A random blood glucose test. Your blood glucose is checked at any time of the day regardless of when you ate.  A hemoglobin A1c blood glucose test. A hemoglobin A1c test provides information about blood glucose control over the previous 3 months.  An oral glucose tolerance test (OGTT). Your blood glucose is measured after you have not eaten (fasted) for 1-3 hours and then after you drink a glucose-containing beverage. An OGTT is usually performed during weeks 24-28 of your pregnancy. TREATMENT   You will need to take diabetes medicine or insulin daily to keep blood glucose levels in the desired range.  You will need to match insulin dosing with exercise and healthy food choices. If you have type 1 or type 2 diabetes, your treatment goal is to maintain the following blood glucose levels during pregnancy:  Before meals (preprandial), at bedtime, and overnight: 60-99 mg/dL.  After meals (postprandial): peak of 100-129 mg/dL.  A1c: less than 7%. HOME CARE INSTRUCTIONS   Have your hemoglobin A1c level checked twice a year.  Perform daily blood glucose monitoring as directed by your health care provider. It is common to perform  frequent blood glucose monitoring.  Monitor urine ketones when you are sick and as directed by your health care provider.  Take your diabetes medicine and insulin as directed by your health care provider to maintain your blood glucose level in the desired range.  Never run out of diabetes medicine or insulin. It is needed every day.  Adjust insulin based on your intake of carbohydrates. Carbohydrates can raise blood glucose levels but need to be included in your diet. Carbohydrates provide vitamins, minerals, and fiber, which are an essential part of a healthy diet. Carbohydrates are found in fruits, vegetables, whole grains, dairy products, legumes, and foods containing added sugars.  Eat healthy foods. Alternate 3 meals with 3 snacks.  Maintain a healthy weight gain. The usual total expected weight gain varies according to your prepregnancy body mass index (BMI).  Carry a medical alert card or wear medical alert jewelry.  Carry a 15-gram carbohydrate snack with you at all times to treat low blood sugar (hypoglycemia). Some examples of 15-gram carbohydrate snacks include:  Glucose tablets, 3 or 4.  Glucose gel, 15-gram tube.  Raisins, 2 Tbsp (24 grams).  Jelly beans, 6.  Animal crackers, 8.  Fruit juice, regular soda, or low-fat milk, 4 ounces (120 mL).  Gummy treats, 9.  Recognize  hypoglycemia. Hypoglycemia during pregnancy occurs with blood glucose levels of 60 mg/dL and below. The risk for hypoglycemia increases when fasting or skipping meals, during or after intense exercise, and during sleep. Hypoglycemia symptoms can include:  Tremors or shakes.  Decreased ability to concentrate.  Sweating.  Increased heart rate.  Headache.  Dry mouth.  Hunger.  Irritability.  Anxiety.  Restless sleep.  Altered speech or coordination.  Confusion.  Treat hypoglycemia promptly. If you are alert and able to safely swallow, follow the 15:15 rule:  Take 15-20 grams of  rapid-acting glucose or carbohydrate. Rapid-acting options include glucose gel, glucose tablets, or 4 ounces (120 mL) of fruit juice, regular soda, or low-fat milk.  Check your blood glucose level 15 minutes after taking the glucose.  Take an additional 15-20 grams of glucose if the repeat blood glucose level is still 70 mg/dL or below.  Eat a meal or snack within 1 hour once blood glucose levels return to normal.  Engage in at least 30 minutes of physical activity a day or as directed by your health care provider. Ten minutes of physical activity timed 30 minutes after each meal is encouraged to control postprandial blood glucose levels.  Watch for polyuria (excess urination) and polydipsia (feeling extra thirsty), which are early signs of hyperglycemia. An early awareness of hyperglycemia allows for prompt treatment. Treat hyperglycemia as directed by your health care provider.  Adjust your insulin dosing and food intake, as needed, if you start a new exercise or sport.  Follow your sick-day plan any time you are unable to eat or drink as usual.  Avoid tobacco and alcohol use.  Keep all follow-up visits as directed by your health care provider.  Follow the advice of your health care provider regarding your prenatal and post-delivery (postpartum) appointments, meal planning, exercise, medicines, vitamins, blood tests, other medical tests, and physical activities.  Continue daily skin and foot care. Examine your skin and feet daily for cuts, bruises, redness, nail problems, bleeding, blisters, or sores. A foot exam by a health care provider should be done annually.  Brush your teeth and gums at least twice a day and floss at least once a day. Follow up with your dentist regularly.  Schedule an eye exam during the first trimester of your pregnancy or as directed by your health care provider.  Share your diabetes management plan with your workplace or school.  Stay up-to-date with  immunizations.  Learn to manage stress.  Obtain ongoing diabetes education and support as needed.  Your health care provider may recommend that you take one low-dose aspirin (81 mg) each day to help prevent high blood pressure during your pregnancy (preeclampsia or eclampsia). You may be at risk for preeclampsia or eclampsia if:  You had preeclampsia or eclampsia during a previous pregnancy.  Your baby did not grow as expected during a previous pregnancy.  You experienced preterm birth with a previous pregnancy.  You experienced a separation of the placenta from the uterus (placental abruption) during a previous pregnancy.  You experienced the loss of your baby during a previous pregnancy.  You are pregnant with more than one baby.  You have other medical conditions, such as high blood pressure or autoimmune disease. SEEK MEDICAL CARE IF:   You are unable to eat food or drink fluids for more than 6 hours.  You have nausea and vomiting for more than 6 hours.  You have a blood glucose level of 200 mg/dL and you have ketones in  your urine.  There is a change in mental status.  You develop vision problems.  You have a persistent headache.  You have upper abdominal pain or discomfort.  You have an additional serious sickness.  You have diarrhea for more than 6 hours.  You have been sick or have had a fever for 2 days and are not getting better. SEEK IMMEDIATE MEDICAL CARE IF:  You have difficulty breathing.  You no longer feel your baby moving.  You are bleeding or have discharge from your vagina.  You start having premature contractions or labor. MAKE SURE YOU:  Understand these instructions.  Will watch your condition.  Will get help right away if you are not doing well or get worse.   This information is not intended to replace advice given to you by your health care provider. Make sure you discuss any questions you have with your health care provider.    Document Released: 01/05/2012 Document Revised: 05/03/2014 Document Reviewed: 01/05/2012 Elsevier Interactive Patient Education [employer]2016 [employer]

## 2015-03-03 NOTE — Progress Notes (Signed)
Patient here for review of BG Log sheet and insulin action of her Regular and NPH insulins. MD has increased her doses of NPH to 18 in AM and 22 at bedtime and meal time Regular insulin to 18 units at each meal. Patient expressed good verbal understanding of both insulin action times. I reviewed symptoms of hypoglycemia and treatment options. She states she mixed her NPH and Regular with last pregnancy but takes separate injections this time. The only time she could potentially mix her insulin could be at breakfast. She will discuss this with CDE at her next follow up visit with her. Plan phone call follow up in 1 week and CDE visit in 2 weeks.

## 2015-03-10 ENCOUNTER — Telehealth: Payer: Self-pay | Admitting: *Deleted

## 2015-03-10 DIAGNOSIS — O24419 Gestational diabetes mellitus in pregnancy, unspecified control: Secondary | ICD-10-CM

## 2015-03-10 NOTE — Telephone Encounter (Signed)
Patient called and spoke to Delphina CahillSusan Ross @ front desk. Wanted to speak with Harriett SineNancy and relay blood sugars. Harriett Sineancy no longer in clinic. Darl PikesSusan took down most recent blood sugars as follows: 11/8  177 am/117 pm 11/9  188 am/109 pm 11/10  108 am/ 127 pm 11/11  190 am/161 pm 11/12  185 am/127 pm 11/14  172 am/ 119/pm These values were shown to Dr Debroah LoopArnold. He recommended taking sugars fasting and 2 hours postprandial, log these and follow up with Harriett SineNancy at her next scheduled appt 11/21. Attempted to contact the patient and got her voice mail. Left message asking her to please return to call at the clinic.

## 2015-03-12 MED ORDER — INSULIN NPH (HUMAN) (ISOPHANE) 100 UNIT/ML ~~LOC~~ SUSP: SUBCUTANEOUS | Status: DC

## 2015-03-12 NOTE — Telephone Encounter (Signed)
Spoke w/pt yesterday when she called back regarding her blood sugar values. Further questioning revealed additional information regarding recent blood sugar values which were not given to Cheryl Parker during conversation on 11/14. They are as follows:     Fasting            2hr pc breakfast     2hr pc lunch     2hr pc dinner      11/8       177  117                   159  124      11/9       188  109         ---    106      11/10     108  127        154  106      11/11     190   ---              151  106      11/12     185  127        146  137      11/13     ----  ----        109  198      11/14     172  119        132  131  I advised pt that I will discuss with MD and call back with recommendations. Pt voiced understanding  11/16  1610  Called pt after consult with Dr. Macon LargeAnyanwu and advised her that she should increase NPH insulin to 20 units @ breakfast and 25 units at bedtime.  Novolog insulin dose to remain the same @ 18 units each meal.  Pt voiced understanding.

## 2015-03-17 ENCOUNTER — Other Ambulatory Visit: Payer: Self-pay | Admitting: Family Medicine

## 2015-03-17 ENCOUNTER — Ambulatory Visit (HOSPITAL_COMMUNITY): Payer: Medicaid Other

## 2015-03-17 ENCOUNTER — Ambulatory Visit: Payer: Medicaid Other

## 2015-03-17 ENCOUNTER — Encounter: Payer: Self-pay | Admitting: *Deleted

## 2015-03-24 ENCOUNTER — Encounter: Payer: Self-pay | Admitting: Obstetrics and Gynecology

## 2015-03-24 ENCOUNTER — Ambulatory Visit (INDEPENDENT_AMBULATORY_CARE_PROVIDER_SITE_OTHER): Payer: Medicaid Other | Admitting: Obstetrics and Gynecology

## 2015-03-24 VITALS — BP 113/67 | HR 103 | Temp 98.6°F | Wt 176.5 lb

## 2015-03-24 DIAGNOSIS — E1165 Type 2 diabetes mellitus with hyperglycemia: Secondary | ICD-10-CM | POA: Diagnosis not present

## 2015-03-24 DIAGNOSIS — O24912 Unspecified diabetes mellitus in pregnancy, second trimester: Secondary | ICD-10-CM | POA: Diagnosis present

## 2015-03-24 DIAGNOSIS — O360120 Maternal care for anti-D [Rh] antibodies, second trimester, not applicable or unspecified: Secondary | ICD-10-CM | POA: Diagnosis not present

## 2015-03-24 DIAGNOSIS — O34219 Maternal care for unspecified type scar from previous cesarean delivery: Secondary | ICD-10-CM | POA: Diagnosis not present

## 2015-03-24 DIAGNOSIS — O0992 Supervision of high risk pregnancy, unspecified, second trimester: Secondary | ICD-10-CM

## 2015-03-24 LAB — POCT URINALYSIS DIP (DEVICE)
BILIRUBIN URINE: NEGATIVE
Glucose, UA: >=1000 mg/dL — AB
Hgb urine dipstick: NEGATIVE
Ketones, ur: 80 mg/dL — AB
Leukocytes, UA: NEGATIVE
NITRITE: NEGATIVE
PH: 6 (ref 5.0–8.0)
PROTEIN: NEGATIVE mg/dL
Specific Gravity, Urine: 1.025 (ref 1.005–1.030)
Urobilinogen, UA: 0.2 mg/dL (ref 0.0–1.0)

## 2015-03-24 MED ORDER — ONDANSETRON 4 MG PO TBDP: 4.0000 mg | ORAL_TABLET | Freq: Four times a day (QID) | ORAL | Status: DC | PRN

## 2015-03-24 NOTE — Progress Notes (Signed)
Pressure- pelvic area Educated pt on Skin to Skin  Pt requests refill on Zofran

## 2015-03-24 NOTE — Progress Notes (Deleted)
Nutrition note: DM diet f/u Pt

## 2015-03-24 NOTE — Progress Notes (Signed)
Nutrition note: DM diet f/u Pt has gained 16.5# @ 5216w5d, which is > expected. Pt has been checking her BS-since last medication change: fasting: 117-162; 2 hr pp: 67-225. MD suggested that it could be due to her body going through stress because she is having a lot of N&V and therefore sometimes not eating a lot.  Pt also indicated that she sometimes drinks tea or other sugar beverages. Pt reports eating 3 meals & 1 snack/d. Pt is taking a PNV. Reviewed DM diet and discussed options for when she feels nauseous. Per client, MD increased her medications today. Encouraged pt to cut out all sugar beverages. Also discussed protein-rich, CHO-free foods that she can snack on if she is hungry but unable to have CHO at that time.  Pt agreeable & reports no other ?s at this time. F/u in 2-4 wks or as needed Blondell RevealLaura Dodie Parisi, MS, RD, LDN, [facility]

## 2015-03-24 NOTE — Progress Notes (Signed)
Subjective:  Cheryl Parker is a [age] y.o. G3P2002 at 6093w5d being seen today for ongoing prenatal care.  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  Patient reports nausea.  Contractions: Not present. Vag. Bleeding: None.  Movement: Present. Denies leaking of fluid.   The following portions of the patient's history were reviewed and updated as appropriate: allergies, current medications, past family history, past medical history, past social history, past surgical history and problem list. Problem list updated.  Objective:   Filed Vitals:   03/24/15 0919  BP: 113/67  Pulse: 103  Temp: 98.6 F (37 C)  Weight: 176 lb 8 oz (80.06 kg)    Fetal Status: Fetal Heart Rate (bpm): 152   Movement: Present     General:  Alert, oriented and cooperative. Patient is in no acute distress.  Skin: Skin is warm and dry. No rash noted.   Cardiovascular: Normal heart rate noted  Respiratory: Normal respiratory effort, no problems with respiration noted  Abdomen: Soft, gravid, appropriate for gestational age. Pain/Pressure: Present     Pelvic: Vag. Bleeding: None     Cervical exam deferred        Extremities: Normal range of motion.  Edema: None  Mental Status: Normal mood and affect. Normal behavior. Normal judgment and thought content.   Urinalysis: Urine Protein: Negative Urine Glucose: 4+  Assessment and Plan:   Pregnancy: G3P2002 at 8793w5d  1. Type 2 diabetes mellitus with hyperglycemia, unspecified long term insulin use status (HCC) CBGs all out of range with fasting as high as 179, 2 hr pp 76-225. Patient is not always adhering to the diet and often skips meals due to nausea. She is not taking a protein snack at bedtime and is not exercises. Discussed the benefits of all these modifications with the patient. Patient to see nutritionist today Increased NPH 27 in am and 32 with dinner  2. Supervision of high-risk pregnancy, second trimester   3. Rh negative, antepartum, second trimester, not applicable or unspecified fetus Needs rhogam by next visit  4. Previous cesarean section complicating pregnancy, antepartum condition or complication Will schedule repeat at 39 weeks  5. Diabetes mellitus in pregnancy, antepartum, second trimester Follow up growth ultrasound 11/29  Preterm labor symptoms and general obstetric precautions including but not limited to vaginal bleeding, contractions, leaking of fluid and fetal movement were reviewed in detail with the patient. Please refer to After Visit Summary for other counseling recommendations.  Return in about 2 weeks (around 04/07/2015).   Catalina AntiguaPeggy Daila Elbert, MD

## 2015-03-25 ENCOUNTER — Ambulatory Visit (HOSPITAL_COMMUNITY)
Admission: RE | Admit: 2015-03-25 | Discharge: 2015-03-25 | Disposition: A | Discharge status: Home / Self Care | Payer: Medicaid Other | Source: Ambulatory Visit | Attending: Obstetrics and Gynecology | Admitting: Obstetrics and Gynecology

## 2015-03-25 ENCOUNTER — Other Ambulatory Visit (HOSPITAL_COMMUNITY): Payer: Self-pay | Admitting: Maternal and Fetal Medicine

## 2015-03-25 VITALS — BP 121/72 | HR 101 | Wt 175.2 lb

## 2015-03-25 DIAGNOSIS — O24912 Unspecified diabetes mellitus in pregnancy, second trimester: Secondary | ICD-10-CM

## 2015-03-25 DIAGNOSIS — O34219 Maternal care for unspecified type scar from previous cesarean delivery: Secondary | ICD-10-CM | POA: Insufficient documentation

## 2015-03-25 DIAGNOSIS — O09522 Supervision of elderly multigravida, second trimester: Secondary | ICD-10-CM | POA: Diagnosis present

## 2015-03-25 DIAGNOSIS — O24913 Unspecified diabetes mellitus in pregnancy, third trimester: Secondary | ICD-10-CM

## 2015-03-25 DIAGNOSIS — Z3A25 25 weeks gestation of pregnancy: Secondary | ICD-10-CM | POA: Diagnosis not present

## 2015-03-25 DIAGNOSIS — O24312 Unspecified pre-existing diabetes mellitus in pregnancy, second trimester: Secondary | ICD-10-CM | POA: Diagnosis not present

## 2015-03-25 DIAGNOSIS — O360120 Maternal care for anti-D [Rh] antibodies, second trimester, not applicable or unspecified: Secondary | ICD-10-CM

## 2015-03-31 NOTE — Telephone Encounter (Signed)
See above note under contacts tab by Gilman ButtnerNancy Halpin RN.

## 2015-04-04 ENCOUNTER — Encounter (HOSPITAL_COMMUNITY): Payer: Self-pay | Admitting: *Deleted

## 2015-04-07 ENCOUNTER — Ambulatory Visit (INDEPENDENT_AMBULATORY_CARE_PROVIDER_SITE_OTHER): Payer: Medicaid Other | Admitting: Family Medicine

## 2015-04-07 VITALS — BP 124/73 | HR 109 | Temp 98.1°F | Wt 177.9 lb

## 2015-04-07 DIAGNOSIS — F1210 Cannabis abuse, uncomplicated: Secondary | ICD-10-CM

## 2015-04-07 DIAGNOSIS — O360130 Maternal care for anti-D [Rh] antibodies, third trimester, not applicable or unspecified: Secondary | ICD-10-CM

## 2015-04-07 DIAGNOSIS — O24913 Unspecified diabetes mellitus in pregnancy, third trimester: Secondary | ICD-10-CM | POA: Diagnosis not present

## 2015-04-07 DIAGNOSIS — O24419 Gestational diabetes mellitus in pregnancy, unspecified control: Secondary | ICD-10-CM | POA: Diagnosis not present

## 2015-04-07 DIAGNOSIS — O352XX0 Maternal care for (suspected) hereditary disease in fetus, not applicable or unspecified: Secondary | ICD-10-CM | POA: Diagnosis not present

## 2015-04-07 DIAGNOSIS — O09522 Supervision of elderly multigravida, second trimester: Secondary | ICD-10-CM | POA: Diagnosis not present

## 2015-04-07 DIAGNOSIS — Z23 Encounter for immunization: Secondary | ICD-10-CM

## 2015-04-07 DIAGNOSIS — F1290 Cannabis use, unspecified, uncomplicated: Secondary | ICD-10-CM

## 2015-04-07 DIAGNOSIS — O34219 Maternal care for unspecified type scar from previous cesarean delivery: Secondary | ICD-10-CM | POA: Diagnosis not present

## 2015-04-07 DIAGNOSIS — O0992 Supervision of high risk pregnancy, unspecified, second trimester: Secondary | ICD-10-CM

## 2015-04-07 LAB — CBC
HEMATOCRIT: 37.1 % (ref 36.0–46.0)
Hemoglobin: 12.4 g/dL (ref 12.0–15.0)
MCH: 31.4 pg (ref 26.0–34.0)
MCHC: 33.4 g/dL (ref 30.0–36.0)
MCV: 93.9 fL (ref 78.0–100.0)
MPV: 10.6 fL (ref 8.6–12.4)
PLATELETS: 336 10*3/uL (ref 150–400)
RBC: 3.95 MIL/uL (ref 3.87–5.11)
RDW: 12.8 % (ref 11.5–15.5)
WBC: 12.3 10*3/uL — AB (ref 4.0–10.5)

## 2015-04-07 LAB — POCT URINALYSIS DIP (DEVICE)
Bilirubin Urine: NEGATIVE
GLUCOSE, UA: 100 mg/dL — AB
Hgb urine dipstick: NEGATIVE
KETONES UR: 80 mg/dL — AB
NITRITE: NEGATIVE
PROTEIN: NEGATIVE mg/dL
Specific Gravity, Urine: >=1.03 (ref 1.005–1.030)
Urobilinogen, UA: 0.2 mg/dL (ref 0.0–1.0)
pH: 5.5 (ref 5.0–8.0)

## 2015-04-07 MED ORDER — RHO D IMMUNE GLOBULIN 1500 UNIT/2ML IJ SOSY
300.0000 ug | PREFILLED_SYRINGE | Freq: Once | INTRAMUSCULAR | Status: AC
  Administered 2015-04-07: 300 ug via INTRAMUSCULAR

## 2015-04-07 MED ORDER — INSULIN ASPART 100 UNIT/ML ~~LOC~~ SOLN: SUBCUTANEOUS | Status: DC

## 2015-04-07 MED ORDER — TETANUS-DIPHTH-ACELL PERTUSSIS 5-2.5-18.5 LF-MCG/0.5 IM SUSP
0.5000 mL | Freq: Once | INTRAMUSCULAR | Status: AC
  Administered 2015-04-07: 0.5 mL via INTRAMUSCULAR

## 2015-04-07 MED ORDER — INSULIN NPH (HUMAN) (ISOPHANE) 100 UNIT/ML ~~LOC~~ SUSP: SUBCUTANEOUS | Status: DC

## 2015-04-07 NOTE — Progress Notes (Signed)
Subjective:  Cheryl Parker is a 34 y.o. G3P2002 at 593w5d being seen today for ongoing prenatal care.  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  Patient reports backache and headache.  Contractions: Not present. Vag. Bleeding: None.  Movement: Present. Denies leaking of fluid.   Currently takes insulin: Clear 22u cloudy 25, 32 qhs  The following portions of the patient's history were reviewed and updated as appropriate: allergies, current medications, past family history, past medical history, past social history, past surgical history and problem list. Problem list updated.  Objective:   Filed Vitals:   04/07/15 1153  BP: 124/73  Pulse: 109  Temp: 98.1 F (36.7 C)  Weight: 177 lb 14.4 oz (80.695 kg)    Fetal Status: Fetal Heart Rate (bpm): 155   Movement: Present     General:  Alert, oriented and cooperative. Patient is in no acute distress.  Skin: Skin is warm and dry. No rash noted.   Cardiovascular: Normal heart rate noted  Respiratory: Normal respiratory effort, no problems with respiration noted  Abdomen: Soft, gravid, appropriate for gestational age. Pain/Pressure: Present     Pelvic: Vag. Bleeding: None     Cervical exam deferred        Extremities: Normal range of motion.  Edema: None  Mental Status: Normal mood and affect. Normal behavior. Normal judgment and thought content.    Urinalysis: Urine Protein: Negative Urine Glucose: 1+  Fasting: 107-196 2hr  62-287 lunch 98-217 dinner  Assessment and Plan:  Pregnancy: G3P2002 at 263w5d  1. Diabetes mellitus in pregnancy, antepartum, third trimester- POORLY CONTROLLED -Fetal echo performed and wnl -Taking ASA for prex prevention - increase NPH to 28u qAM and 34 qPM -increase Aspart 24u with meals - f/u with DM ed in 1 week to review sugars and then 2-3 weeks [facility]   2. Advanced maternal age in multigravida, second trimester  3. Marijuana use  4. Previous cesarean section complicating pregnancy, antepartum condition or complication -scheduled for 3/1 RCS  5. 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 Rh negative, antepartum, third trimester, not applicable or unspecified fetus -Rhogam today  7. Supervision of high-risk pregnancy, second trimester - updated box - rhogam today - CBC  - TDap  Preterm labor symptoms and general obstetric precautions including but not limited to vaginal bleeding, contractions, leaking of fluid and fetal movement were reviewed in detail with the patient. Please refer to After Visit Summary for other counseling recommendations.  Return in about 1 week (around 04/14/2015) for with DM ed for sugar check.   Federico FlakeKimberly Niles Rion Schnitzer, MD  Future Appointments Date Time Provider Department Center  04/22/2015 4:00 PM [facility] US 1 [facility]

## 2015-04-07 NOTE — Progress Notes (Signed)
Breastfeeding tip of the week reviewed Tdap today Rhogam today 28 week labs today

## 2015-04-07 NOTE — Patient Instructions (Addendum)
NPH (cloudy) 28u morning  34u at night Aspart (clear)- 24u with meals  Continue health diet with small frequent meals   Gestational Diabetes Mellitus Gestational diabetes mellitus, often simply referred to as gestational diabetes, is a type of diabetes that some women develop during pregnancy. In gestational diabetes, the pancreas does not make enough insulin (a hormone), the cells are less responsive to the insulin that is made (insulin resistance), or both.Normally, insulin moves sugars from food into the tissue cells. The tissue cells use the sugars for energy. The lack of insulin or the lack of normal response to insulin causes excess sugars to build up in the blood instead of going into the tissue cells. As a result, high blood sugar (hyperglycemia) develops. The effect of high sugar (glucose) levels can cause many problems.  RISK FACTORS You have an increased chance of developing gestational diabetes if you have a family history of diabetes and also have one or more of the following risk factors:  A body mass index over 30 (obesity).  A previous pregnancy with gestational diabetes.  An older age at the time of pregnancy. If blood glucose levels are kept in the normal range during pregnancy, women can have a healthy pregnancy. If your blood glucose levels are not well controlled, there may be risks to you, your unborn baby (fetus), your labor and delivery, or your newborn baby.  SYMPTOMS  If symptoms are experienced, they are much like symptoms you would normally expect during pregnancy. The symptoms of gestational diabetes include:   Increased thirst (polydipsia).  Increased urination (polyuria).  Increased urination during the night (nocturia).  Weight loss. This weight loss may be rapid.  Frequent, recurring infections.  Tiredness (fatigue).  Weakness.  Vision changes, such as blurred vision.  Fruity smell to your breath.  Abdominal pain. DIAGNOSIS Diabetes is  diagnosed when blood glucose levels are increased. Your blood glucose level may be checked by one or more of the following blood tests:  A fasting blood glucose test. You will not be allowed to eat for at least 8 hours before a blood sample is taken.  A random blood glucose test. Your blood glucose is checked at any time of the day regardless of when you ate.  An oral glucose tolerance test (OGTT). Your blood glucose is measured after you have not eaten (fasted) for 1-3 hours and then after you drink a glucose-containing beverage. Since the hormones that cause insulin resistance are highest at about 24-28 weeks of a pregnancy, an OGTT is usually performed during that time. If you have risk factors, you may be screened for undiagnosed type 2 diabetes at your first prenatal visit. TREATMENT  Gestational diabetes should be managed first with diet and exercise. Medicines may be added only if they are needed.  You will need to take diabetes medicine or insulin daily to keep blood glucose levels in the desired range.  You will need to match insulin dosing with exercise and healthy food choices. If you have gestational diabetes, your treatment goal is to maintain the following blood glucose levels:  Before meals (preprandial): at or below 95 mg/dL.  After meals (postprandial):  One hour after a meal: at or below 140 mg/dL.  Two hours after a meal: at or below 120 mg/dL. If you have pre-existing type 1 or type 2 diabetes, your treatment goal is to maintain the following blood glucose levels:  Before meals, at bedtime, and overnight: 60-99 mg/dL.  After meals: peak of 100-129 mg/dL.  HOME CARE INSTRUCTIONS   Have your hemoglobin A1c level checked twice a year.  Perform daily blood glucose monitoring as directed by your health care provider. It is common to perform frequent blood glucose monitoring.  Monitor urine ketones when you are ill and as directed by your health care provider.  Take  your diabetes medicine and insulin as directed by your health care provider to maintain your blood glucose level in the desired range.  Never run out of diabetes medicine or insulin. It is needed every day.  Adjust insulin based on your intake of carbohydrates. Carbohydrates can raise blood glucose levels but need to be included in your diet. Carbohydrates provide vitamins, minerals, and fiber which are an essential part of a healthy diet. Carbohydrates are found in fruits, vegetables, whole grains, dairy products, legumes, and foods containing added sugars.  Eat healthy foods. Alternate 3 meals with 3 snacks.  Maintain a healthy weight gain. The usual total expected weight gain varies according to your prepregnancy body mass index (BMI).  Carry a medical alert card or wear your medical alert jewelry.  Carry a 15-gram carbohydrate snack with you at all times to treat low blood glucose (hypoglycemia). Some examples of 15-gram carbohydrate snacks include:  Glucose tablets, 3 or 4.  Glucose gel, 15-gram tube.  Raisins, 2 tablespoons (24 g).  Jelly beans, 6.  Animal crackers, 8.  Fruit juice, regular soda, or low-fat milk, 4 ounces (120 mL).  Gummy treats, 9.  Recognize hypoglycemia. Hypoglycemia during pregnancy occurs with blood glucose levels of 60 mg/dL and below. The risk for hypoglycemia increases when fasting or skipping meals, during or after intense exercise, and during sleep. Hypoglycemia symptoms can include:  Tremors or shakes.  Decreased ability to concentrate.  Sweating.  Increased heart rate.  Headache.  Dry mouth.  Hunger.  Irritability.  Anxiety.  Restless sleep.  Altered speech or coordination.  Confusion.  Treat hypoglycemia promptly. If you are alert and able to safely swallow, follow the 15:15 rule:  Take 15-20 grams of rapid-acting glucose or carbohydrate. Rapid-acting options include glucose gel, glucose tablets, or 4 ounces (120 mL) of fruit  juice, regular soda, or low-fat milk.  Check your blood glucose level 15 minutes after taking the glucose.  Take 15-20 grams more of glucose if the repeat blood glucose level is still 70 mg/dL or below.  Eat a meal or snack within 1 hour once blood glucose levels return to normal.  Be alert to polyuria (excess urination) and polydipsia (excess thirst) which are early signs of hyperglycemia. An early awareness of hyperglycemia allows for prompt treatment. Treat hyperglycemia as directed by your health care provider.  Engage in at least 30 minutes of physical activity a day or as directed by your health care provider. Ten minutes of physical activity timed 30 minutes after each meal is encouraged to control postprandial blood glucose levels.  Adjust your insulin dosing and food intake as needed if you start a new exercise or sport.  Follow your sick-day plan at any time you are unable to eat or drink as usual.  Avoid tobacco and alcohol use.  Keep all follow-up visits as directed by your health care provider.  Follow the advice of your health care provider regarding your prenatal and post-delivery (postpartum) appointments, meal planning, exercise, medicines, vitamins, blood tests, other medical tests, and physical activities.  Perform daily skin and foot care. Examine your skin and feet daily for cuts, bruises, redness, nail problems, bleeding, blisters, or sores.  Brush your teeth and gums at least twice a day and floss at least once a day. Follow up with your dentist regularly.  Schedule an eye exam during the first trimester of your pregnancy or as directed by your health care provider.  Share your diabetes management plan with your workplace or school.  Stay up-to-date with immunizations.  Learn to manage stress.  Obtain ongoing diabetes education and support as needed.  Learn about and consider breastfeeding your baby.  You should have your blood sugar level checked 6-12  weeks after delivery. This is done with an oral glucose tolerance test (OGTT). SEEK MEDICAL CARE IF:   You are unable to eat food or drink fluids for more than 6 hours.  You have nausea and vomiting for more than 6 hours.  You have a blood glucose level of 200 mg/dL and you have ketones in your urine.  There is a change in mental status.  You develop vision problems.  You have a persistent headache.  You have upper abdominal pain or discomfort.  You develop an additional serious illness.  You have diarrhea for more than 6 hours.  You have been sick or have had a fever for a couple of days and are not getting better. SEEK IMMEDIATE MEDICAL CARE IF:   You have difficulty breathing.  You no longer feel the baby moving.  You are bleeding or have discharge from your vagina.  You start having premature contractions or labor. MAKE SURE YOU:  Understand these instructions.  Will watch your condition.  Will get help right away if you are not doing well or get worse.   This information is not intended to replace advice given to you by your health care provider. Make sure you discuss any questions you have with your health care provider.   Document Released: 07/19/2000 Document Revised: 05/03/2014 Document Reviewed: 11/09/2011 Elsevier Interactive Patient Education [employer]2016 Elsevier Inc.

## 2015-04-08 LAB — RPR

## 2015-04-08 LAB — HIV ANTIBODY (ROUTINE TESTING W REFLEX): HIV: NONREACTIVE

## 2015-04-13 ENCOUNTER — Encounter (HOSPITAL_COMMUNITY): Payer: Self-pay

## 2015-04-13 ENCOUNTER — Inpatient Hospital Stay (HOSPITAL_COMMUNITY)
Admission: AD | Admit: 2015-04-13 | Discharge: 2015-04-13 | Disposition: A | Discharge status: Home / Self Care | Payer: Medicaid Other | Source: Ambulatory Visit | Attending: Obstetrics & Gynecology | Admitting: Obstetrics & Gynecology

## 2015-04-13 DIAGNOSIS — E119 Type 2 diabetes mellitus without complications: Secondary | ICD-10-CM | POA: Diagnosis not present

## 2015-04-13 DIAGNOSIS — G44209 Tension-type headache, unspecified, not intractable: Secondary | ICD-10-CM | POA: Insufficient documentation

## 2015-04-13 DIAGNOSIS — Z87891 Personal history of nicotine dependence: Secondary | ICD-10-CM | POA: Diagnosis not present

## 2015-04-13 DIAGNOSIS — I10 Essential (primary) hypertension: Secondary | ICD-10-CM | POA: Diagnosis not present

## 2015-04-13 DIAGNOSIS — Z794 Long term (current) use of insulin: Secondary | ICD-10-CM | POA: Diagnosis not present

## 2015-04-13 DIAGNOSIS — R51 Headache: Secondary | ICD-10-CM | POA: Diagnosis present

## 2015-04-13 MED ORDER — OXYCODONE-ACETAMINOPHEN 5-325 MG PO TABS
2.0000 | ORAL_TABLET | Freq: Once | ORAL | Status: AC
  Administered 2015-04-13: 2 via ORAL
  Filled 2015-04-13: qty 2

## 2015-04-13 NOTE — MAU Note (Signed)
Notified provider that patient has c/o neck and head pain and visual disturbances BP. Hx of PIH but hasn't had trouble with it during this pregnancy. Provider said she would be down to see pt.

## 2015-04-13 NOTE — MAU Provider Note (Signed)
History   G3P2002 in with c/o headache back of neck and frontal since yesterday. Has not taken anything for it today.  CSN: [id number]  Arrival date & time 04/13/15  1627   None     Chief Complaint  Patient presents with  . Neck Pain    HPI  Past Medical History  Diagnosis Date  . Hypertension   . Pregnancy induced hypertension   . 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 DKA (diabetic ketoacidoses) (HCC) 09/10/2014    Past Surgical History  Procedure Laterality Date  . Cesarean section    . Cesarean section N/A 07/05/2012    Procedure: CESAREAN SECTION;  Surgeon: Catalina AntiguaPeggy Constant, MD;  Location: [facility];  Service: Obstetrics;  Laterality: N/A;    Family History  Problem Relation Age of Onset  . Diabetes Mother   . Kidney disease Father   . Birth defects Son     unilateral renal agenesis    Social History  Substance Use Topics  . Smoking status: Former Games developermoker  . Smokeless tobacco: Never Used  . Alcohol Use: No    OB History    Gravida Para Term Preterm AB TAB SAB Ectopic Multiple Living   3 2 2  0 0 0 0 0 0 2      Review of Systems  Constitutional: Negative.   Eyes: Negative.   Respiratory: Negative.   Cardiovascular: Negative.   Gastrointestinal: Negative.   Endocrine: Negative.   Genitourinary: Negative.   Musculoskeletal: Positive for neck pain.  Skin: Negative.   Neurological: Positive for headaches.  Hematological: Negative.   Psychiatric/Behavioral: Negative.     Allergies  Review of patient's allergies indicates no known allergies.  Home Medications  No current outpatient prescriptions on file.  BP 126/69 mmHg  Pulse 94  Resp 17  Ht 5\' 2"  (1.575 m)  Wt 178 lb (80.74 kg)  BMI 32.55 kg/m2  LMP 09/25/2014 (Approximate)  Physical Exam  Constitutional: She is oriented to person, place, and time. She appears well-developed and well-nourished.  HENT:  Head: Normocephalic.  Eyes: Pupils are equal, round, and  reactive to light.  Neck: Normal range of motion.  Cardiovascular: Normal rate, regular rhythm, normal heart sounds and intact distal pulses.   Pulmonary/Chest: Effort normal and breath sounds normal.  Abdominal: Soft. Bowel sounds are normal.  Musculoskeletal: Normal range of motion.  Neurological: She is alert and oriented to person, place, and time. She has normal reflexes.  Skin: Skin is warm and dry.  Psychiatric: She has a normal mood and affect. Her behavior is normal. Judgment and thought content normal.    MAU Course  Procedures (including critical care time)  Labs Reviewed - No data to display No results found.   No diagnosis found.    MDM  Tension headache. Will d/c home with resolution after pain med.

## 2015-04-13 NOTE — MAU Note (Signed)
Patient presents with c/o sharp neck and head pain since yesterday at 1600

## 2015-04-13 NOTE — Discharge Instructions (Signed)

## 2015-04-14 ENCOUNTER — Inpatient Hospital Stay (HOSPITAL_COMMUNITY)
Admission: AD | Admit: 2015-04-14 | Discharge: 2015-04-14 | Disposition: A | Discharge status: Home / Self Care | Payer: Medicaid Other | Source: Ambulatory Visit | Attending: Obstetrics and Gynecology | Admitting: Obstetrics and Gynecology

## 2015-04-14 ENCOUNTER — Encounter: Payer: Medicaid Other | Attending: Obstetrics & Gynecology | Admitting: *Deleted

## 2015-04-14 ENCOUNTER — Encounter (HOSPITAL_COMMUNITY): Payer: Self-pay

## 2015-04-14 ENCOUNTER — Ambulatory Visit: Payer: Medicaid Other | Admitting: *Deleted

## 2015-04-14 DIAGNOSIS — O26893 Other specified pregnancy related conditions, third trimester: Secondary | ICD-10-CM | POA: Diagnosis not present

## 2015-04-14 DIAGNOSIS — E119 Type 2 diabetes mellitus without complications: Secondary | ICD-10-CM | POA: Diagnosis not present

## 2015-04-14 DIAGNOSIS — O24113 Pre-existing diabetes mellitus, type 2, in pregnancy, third trimester: Secondary | ICD-10-CM | POA: Diagnosis not present

## 2015-04-14 DIAGNOSIS — Z713 Dietary counseling and surveillance: Secondary | ICD-10-CM | POA: Diagnosis not present

## 2015-04-14 DIAGNOSIS — Z3A28 28 weeks gestation of pregnancy: Secondary | ICD-10-CM | POA: Insufficient documentation

## 2015-04-14 DIAGNOSIS — O24919 Unspecified diabetes mellitus in pregnancy, unspecified trimester: Secondary | ICD-10-CM | POA: Insufficient documentation

## 2015-04-14 DIAGNOSIS — G44201 Tension-type headache, unspecified, intractable: Secondary | ICD-10-CM | POA: Diagnosis not present

## 2015-04-14 DIAGNOSIS — Z794 Long term (current) use of insulin: Secondary | ICD-10-CM | POA: Diagnosis not present

## 2015-04-14 DIAGNOSIS — O24119 Pre-existing diabetes mellitus, type 2, in pregnancy, unspecified trimester: Secondary | ICD-10-CM

## 2015-04-14 DIAGNOSIS — G44209 Tension-type headache, unspecified, not intractable: Secondary | ICD-10-CM

## 2015-04-14 LAB — COMPREHENSIVE METABOLIC PANEL
ALK PHOS: 81 U/L (ref 38–126)
ALT: 17 U/L (ref 14–54)
AST: 22 U/L (ref 15–41)
Albumin: 3 g/dL — ABNORMAL LOW (ref 3.5–5.0)
Anion gap: 11 (ref 5–15)
BUN: 6 mg/dL (ref 6–20)
CHLORIDE: 104 mmol/L (ref 101–111)
CO2: 19 mmol/L — AB (ref 22–32)
CREATININE: 0.46 mg/dL (ref 0.44–1.00)
Calcium: 9.1 mg/dL (ref 8.9–10.3)
GFR calc Af Amer: >60 mL/min (ref >60)
Glucose, Bld: 196 mg/dL — ABNORMAL HIGH (ref 65–99)
Potassium: 4.2 mmol/L (ref 3.5–5.1)
Sodium: 134 mmol/L — ABNORMAL LOW (ref 135–145)
Total Bilirubin: 0.8 mg/dL (ref 0.3–1.2)
Total Protein: 6.5 g/dL (ref 6.5–8.1)

## 2015-04-14 LAB — PROTEIN / CREATININE RATIO, URINE
Creatinine, Urine: 109 mg/dL
PROTEIN CREATININE RATIO: 0.25 mg/mg{creat} — AB (ref 0.00–0.15)
Total Protein, Urine: 27 mg/dL

## 2015-04-14 LAB — CBC WITH DIFFERENTIAL/PLATELET
BASOS ABS: 0 10*3/uL (ref 0.0–0.1)
Basophils Relative: 0 %
EOS PCT: 0 %
Eosinophils Absolute: 0 10*3/uL (ref 0.0–0.7)
HCT: 38.9 % (ref 36.0–46.0)
HEMOGLOBIN: 12.7 g/dL (ref 12.0–15.0)
LYMPHS PCT: 11 %
Lymphs Abs: 1.6 10*3/uL (ref 0.7–4.0)
MCH: 30.8 pg (ref 26.0–34.0)
MCHC: 32.6 g/dL (ref 30.0–36.0)
MCV: 94.4 fL (ref 78.0–100.0)
Monocytes Absolute: 0.6 10*3/uL (ref 0.1–1.0)
Monocytes Relative: 4 %
NEUTROS ABS: 12.3 10*3/uL — AB (ref 1.7–7.7)
NEUTROS PCT: 85 %
PLATELETS: 323 10*3/uL (ref 150–400)
RBC: 4.12 MIL/uL (ref 3.87–5.11)
RDW: 12.5 % (ref 11.5–15.5)
WBC: 14.5 10*3/uL — AB (ref 4.0–10.5)

## 2015-04-14 LAB — URINE MICROSCOPIC-ADD ON

## 2015-04-14 LAB — URINALYSIS, ROUTINE W REFLEX MICROSCOPIC
Bilirubin Urine: NEGATIVE
GLUCOSE, UA: 500 mg/dL — AB
HGB URINE DIPSTICK: NEGATIVE
Ketones, ur: >80 mg/dL — AB
Nitrite: NEGATIVE
PH: 6 (ref 5.0–8.0)
PROTEIN: 30 mg/dL — AB

## 2015-04-14 MED ORDER — ACETAMINOPHEN 325 MG PO TABS: 650.0000 mg | ORAL_TABLET | Freq: Once | ORAL | Status: DC

## 2015-04-14 MED ORDER — METOCLOPRAMIDE HCL 5 MG/ML IJ SOLN
10.0000 mg | Freq: Once | INTRAMUSCULAR | Status: AC
  Administered 2015-04-14: 10 mg via INTRAVENOUS
  Filled 2015-04-14: qty 2

## 2015-04-14 MED ORDER — LACTATED RINGERS IV BOLUS (SEPSIS): 1000.0000 mL | Freq: Once | INTRAVENOUS | Status: DC

## 2015-04-14 MED ORDER — DIPHENHYDRAMINE HCL 50 MG/ML IJ SOLN
25.0000 mg | Freq: Once | INTRAMUSCULAR | Status: AC
  Administered 2015-04-14: 11:00:00 via INTRAVENOUS
  Filled 2015-04-14: qty 1

## 2015-04-14 MED ORDER — DEXAMETHASONE SODIUM PHOSPHATE 10 MG/ML IJ SOLN
10.0000 mg | Freq: Once | INTRAMUSCULAR | Status: AC
  Administered 2015-04-14: 10 mg via INTRAVENOUS
  Filled 2015-04-14: qty 1

## 2015-04-14 NOTE — MAU Provider Note (Signed)
History     CSN: [id number]  Arrival date and time: 04/14/15 4098   None     Chief Complaint  Patient presents with  . Back Pain  . Headache   HPI   Cheryl Parker is 34 y.o.  [id number] @ [redacted]w[redacted]d presenting for headache of 2 days duration. Patient states that she has pain in the back of neck. Patient also states she has pain in head when leaning forward. Patient denies any hx of headaches.  Patient states she has had a headache for 2 days. Patient received percoet x1 in the MAU yesterday, and that didn't completely resolve the headache. Patient states she feels dry, she states she has not bee urinating as much as normal does. No blurry vision, no swelling in extremities, and no RUQ pain. Patient denies any numbness, tingling, or weakness throughout her body. Patient denies any fevers or chills. Patient states she has not taken  tylenol in the last two weeks. Patient's blood sugars this AM were in 110s.   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  Negative contractions, LOF or vaginal bleeding.   OB History    Gravida Para Term Preterm AB TAB SAB Ectopic Multiple Living   0 0 0 0 0 0 2      Past Medical History  Diagnosis Date  . Hypertension   . Pregnancy induced hypertension   . 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 DKA (diabetic ketoacidoses) (HCC) 09/10/2014    Past Surgical  History  Procedure Laterality Date  . Cesarean section    . Cesarean section N/A 07/05/2012    Procedure: CESAREAN SECTION;  Surgeon: Catalina Antigua, MD;  Location: [facility];  Service: Obstetrics;  Laterality: N/A;    Family History  Problem Relation Age of Onset  . Diabetes Mother   . Kidney disease Father   . Birth defects Son     unilateral renal agenesis    Social History  Substance Use Topics  . Smoking status: Former Games developer  . Smokeless tobacco: Never Used  . Alcohol Use: No    Allergies: No Known Allergies  Prescriptions prior to admission  Medication Sig Dispense Refill Last Dose  . acetaminophen (TYLENOL) 325 MG tablet Take 650 mg by mouth every 6 (six) hours as needed for mild pain or headache.    04/12/2015 at 1300  . aspirin 81 MG chewable tablet Chew 1 tablet (81 mg total) by mouth daily. 90 tablet 3 04/13/2015 at 1000  . famotidine (PEPCID) 20 MG tablet Take 1 tablet (20 mg total) by mouth 2 (two) times daily. 60 tablet 6 04/13/2015 at Unknown time  . insulin aspart (NOVOLOG) 100 UNIT/ML injection Inject 25 Units into the skin 3 (three) times daily with meals.   04/13/2015 at Unknown time  . insulin NPH Human (HUMULIN N,NOVOLIN N) 100 UNIT/ML injection Inject 28-34 Units into the skin 2 (two) times daily. Pt uses 28 units in the morning and 34 units at bedtime.   04/13/2015 at Unknown time  . ondansetron (ZOFRAN ODT) 4 MG disintegrating tablet  Take 1 tablet (4 mg total) by mouth every 6 (six) hours as needed for nausea. 20 tablet 0 Past Week at Unknown time  . Prenatal Vit-Fe Fumarate-FA (PRENATAL MULTIVITAMIN) TABS tablet Take 1 tablet by mouth daily.   04/13/2015 at Unknown time  . promethazine (PHENERGAN) 25 MG suppository Place 1 suppository (25 mg total) rectally every 6 (six) hours as needed for nausea or vomiting. (Patient not taking: Reported on 03/24/2015) 30 each 1 Not Taking    Review of Systems  Constitutional: Negative for fever and chills.  HENT:  Negative for congestion.   Eyes: Negative for blurred vision.  Gastrointestinal: Negative for nausea and vomiting.  Musculoskeletal: Positive for neck pain.  Neurological: Positive for headaches. Negative for dizziness, tingling, sensory change and focal weakness.   Physical Exam   Blood pressure 127/73, pulse 97, temperature 98.5 F (36.9 C), temperature source Oral, resp. rate 16, height 5\' 2"  (1.575 m), weight 178 lb (80.74 kg), last menstrual period 09/25/2014.  Physical Exam  Constitutional: She is oriented to person, place, and time.  Eyes: Conjunctivae and EOM are normal. Pupils are equal, round, and reactive to light.  Neck: Normal range of motion. Neck supple. Spinous process tenderness and muscular tenderness present. No rigidity. No Brudzinski's sign and no Kernig's sign noted.  Tenderness to palpation of cervical spinal process and paraspinal muscles   Respiratory: Effort normal and breath sounds normal.  GI: Soft. Bowel sounds are normal.  Neurological: She is alert and oriented to person, place, and time. No cranial nerve deficit. She exhibits normal muscle tone.  Skin: Skin is warm and dry.    MAU Course  Procedures  MDM - Provided headache cocktail  - Will get CBC and CMET - UA and protein/creatinine ratio   CBC    Component Value Date/Time   WBC 14.5* 04/14/2015 1100   RBC 4.12 04/14/2015 1100   HGB 12.7 04/14/2015 1100   HGB 12.8 01/10/2012   HCT 38.9 04/14/2015 1100   PLT 323 04/14/2015 1100   PLT 372 01/10/2012   MCV 94.4 04/14/2015 1100   MCH 30.8 04/14/2015 1100   MCHC 32.6 04/14/2015 1100   RDW 12.5 04/14/2015 1100   LYMPHSABS 1.6 04/14/2015 1100   MONOABS 0.6 04/14/2015 1100   EOSABS 0.0 04/14/2015 1100   BASOSABS 0.0 04/14/2015 1100    CMP Latest Ref Rng 04/14/2015  Glucose 65 - 99 mg/dL 161(W196(H)  BUN 6 - 20 mg/dL 6  Creatinine 9.600.44 - 4.541.00 mg/dL 0.980.46  Sodium 119135 - 147145 mmol/L 134(L)  Potassium 3.5 - 5.1 mmol/L 4.2  Chloride 101 - 111  mmol/L 104  CO2 22 - 32 mmol/L 19(L)  Calcium 8.9 - 10.3 mg/dL 9.1  Total Protein 6.5 - 8.1 g/dL 6.5  Total Bilirubin 0.3 - 1.2 mg/dL 0.8  Alkaline Phos 38 - 126 U/L 81  AST 15 - 41 U/L 22  ALT 14 - 54 U/L 17   Results for Clint BolderMILLER, Cheryl Parker (MRN [id number]) as of 04/14/2015 13:08  04/14/2015 09:45  Appearance CLOUDY (A)  Bacteria, UA FEW (A)  Bilirubin Urine NEGATIVE  Color, Urine YELLOW  Glucose 500 (A)  Hgb urine dipstick NEGATIVE  Ketones, ur >80 (A)  Leukocytes, UA TRACE (A)  Nitrite NEGATIVE  pH 6.0  Protein 30 (A)  RBC / HPF 0-5  Specific Gravity, Urine >1.030 (H)  Squamous Epithelial / LPF 6-30 (A)  WBC, UA 0-5   Results for Clint BolderMILLER, Cheryl Parker (MRN [id number]) as of 04/14/2015 13:08  Ref. Range  04/14/2015 09:45  Total Protein, Urine Latest Units: mg/dL 27  Protein Creatinine Ratio Latest Ref Range: 0.00-0.15 mg/mgCre 0.25 (H)  Creatinine, Urine Latest Units: mg/dL 161.09    Assessment and Plan  #Headache, tension,  Intractable: Patient's bp wnl. Labs wnl (protein/creatine 0.25, AST/ALT/Platelets wnls). Patient received Headache cocktail including Decadron, Benadryl, and Lactated Ringer Bolus. CBG 196, patient due for midday Novolog. Patient negative for signs of infection, WBC 14.5. Negative Kernig's sign or nuchal rigidity. Specific gravity >1.030, patient likely dehydrated - Follow up, patient's headache has resolved  - Will follow up blood sugars this Friday due to steroid admin for headache  - Discussed hydration with patient   Danella Maiers 04/14/2015, 10:33 AM

## 2015-04-14 NOTE — MAU Note (Addendum)
Headache x 2 days was seen in MAU given Percocet, has not been able to eat or drink, feels dehydrated.

## 2015-04-14 NOTE — Discharge Instructions (Signed)
Please follow up in clinic to check blood sugars in the next 3-4 days. Please continue to drink fluids, especially water to keep well hydrated.   Tension Headache A tension headache is pain, pressure, or aching that is felt over the front and sides of your head. These headaches can last from 30 minutes to several days. HOME CARE Managing Pain  Take over-the-counter and prescription medicines only as told by your doctor.  Lie down in a dark, quiet room when you have a headache.  If directed, apply ice to your head and neck area:  Put ice in a plastic bag.  Place a towel between your skin and the bag.  Leave the ice on for 20 minutes, 2-3 times per day.  Use a heating pad or a hot shower to apply heat to your head and neck area as told by your doctor. Eating and Drinking  Eat meals on a regular schedule.  Do not drink a lot of alcohol.  Do not use a lot of caffeine, or stop using caffeine. General Instructions  Keep all follow-up visits as told by your doctor. This is important.  Keep a journal to find out if certain things bring on headaches. For example, write down:  What you eat and drink.  How much sleep you get.  Any change to your diet or medicines.  Try getting a massage, or doing other things that help you to relax.  Lessen stress.  Sit up straight. Do not tighten (tense) your muscles.  Do not use tobacco products. This includes cigarettes, chewing tobacco, or e-cigarettes. If you need help quitting, ask your doctor.  Exercise regularly as told by your doctor.  Get enough sleep. This may mean 7-9 hours of sleep. GET HELP IF:  Your symptoms are not helped by medicine.  You have a headache that feels different from your usual headache.  You feel sick to your stomach (nauseous) or you throw up (vomit).  You have a fever. GET HELP RIGHT AWAY IF:  Your headache becomes very bad.  You keep throwing up.  You have a stiff neck.  You have trouble  seeing.  You have trouble speaking.  You have pain in your eye or ear.  Your muscles are weak or you lose muscle control.  You lose your balance or you have trouble walking.  You feel like you will pass out (faint) or you pass out.  You have confusion.   This information is not intended to replace advice given to you by your health care provider. Make sure you discuss any questions you have with your health care provider.   Document Released: 07/07/2009 Document Revised: 01/01/2015 Document Reviewed: 08/05/2014 Elsevier Interactive Patient Education [employer]2016 Elsevier Inc.

## 2015-04-14 NOTE — Progress Notes (Signed)
Patient present for review of glucose readings. Log reflects FBS range 113-186, 2hpp 137-247. FBS today 190. Patient notes that when she administers insulin that it leaks out of her. This may explain the elevated readings. I advised patient to inject and hold needle into skin for 10 second after administration to insure receipt of insulin. I will call her on Thursday 04/17/15 to review readings with her.

## 2015-04-17 ENCOUNTER — Telehealth: Payer: Self-pay | Admitting: *Deleted

## 2015-04-17 ENCOUNTER — Telehealth: Payer: Self-pay

## 2015-04-17 NOTE — Telephone Encounter (Signed)
-----   Message from Cheryl HooverKelly P Rassette, Cheryl Parker sent at 04/15/2015  8:12 AM EST ----- Regarding: FW: Please schedule Please call this patient on Thursday 04/17/15 in the morning.  Get her blood sugar readings since 04/14/15 and ask Dr. Macon LargeAnyanwu to review them.  The patient received Decadron in MAU for a headache 04/14/15. Thanks, Tresa EndoKelly ----- Message -----    From: Berton BonAsiyah Zahra Mikell, MD    Sent: 04/14/2015   2:51 PM      To: Cheryl HooverKelly P Rassette, Cheryl Parker Subject: RE: Please schedule                            Yeah, however we need to review blood sugars in the next couple of days because she received Decadron in the MAU for her headache. Sorry. Thanks so much  Cheryl    ----- Message -----    From: Cheryl HooverKelly P Rassette, Cheryl Parker    Sent: 04/14/2015   2:29 PM      To: Berton BonAsiyah Zahra Mikell, MD Subject: Please schedule                                She was seen this morning.  Her next visit is scheduled for 05/05/15. ----- Message -----    From: Berton BonAsiyah Zahra Mikell, MD    Sent: 04/14/2015  12:33 PM      To: Cheryl HooverKelly P Rassette, Cheryl Parker  Sandi MariscalHey Cheryl,   Please make an appointment for Cheryl Parker to have her blood sugars reviewed in the next 3-4 days.   Thanks so much,  Cheryl

## 2015-04-17 NOTE — Telephone Encounter (Signed)
Her blood sugars can be elevated for about one week after Decadron administration on 04/14/15.   She can increase her mealtime Novolog to 27 units daily (from 25 units) and NPH to 30 units in morning and 36 at night (changes made in Medication list) Will reevaluate at next office visit.  Tereso NewcomerUgonna A Aviendha Azbell, MD 04/17/2015 1:27 PM   .

## 2015-04-17 NOTE — Telephone Encounter (Signed)
Per Tresa EndoKelly request here is patient blood sugar  12/19 @ 12:30 226 Lunch 259  04/15/2015 189 fasting                      150  Breakfast  Lunch 154 Dinner 152  12/21  Fasting 82 Lunch 154  04/17/2015 Morning 104

## 2015-04-17 NOTE — Telephone Encounter (Signed)
Left message for patient to call us back regarding medicine

## 2015-04-17 NOTE — Telephone Encounter (Signed)
Informed patient of insulin changes per Dr. Macon LargeAnyanwu. Patient had no further questions or concerns.

## 2015-04-19 ENCOUNTER — Other Ambulatory Visit: Payer: Self-pay | Admitting: Obstetrics and Gynecology

## 2015-04-22 ENCOUNTER — Ambulatory Visit (HOSPITAL_COMMUNITY)
Admission: RE | Admit: 2015-04-22 | Discharge: 2015-04-22 | Disposition: A | Discharge status: Home / Self Care | Payer: Medicaid Other | Source: Ambulatory Visit | Attending: Family Medicine | Admitting: Family Medicine

## 2015-04-22 ENCOUNTER — Other Ambulatory Visit (HOSPITAL_COMMUNITY): Payer: Self-pay | Admitting: Maternal and Fetal Medicine

## 2015-04-22 DIAGNOSIS — O360130 Maternal care for anti-D [Rh] antibodies, third trimester, not applicable or unspecified: Secondary | ICD-10-CM

## 2015-04-22 DIAGNOSIS — O26893 Other specified pregnancy related conditions, third trimester: Secondary | ICD-10-CM | POA: Insufficient documentation

## 2015-04-22 DIAGNOSIS — O24113 Pre-existing diabetes mellitus, type 2, in pregnancy, third trimester: Secondary | ICD-10-CM | POA: Insufficient documentation

## 2015-04-22 DIAGNOSIS — O09523 Supervision of elderly multigravida, third trimester: Secondary | ICD-10-CM | POA: Insufficient documentation

## 2015-04-22 DIAGNOSIS — O24913 Unspecified diabetes mellitus in pregnancy, third trimester: Secondary | ICD-10-CM

## 2015-04-22 DIAGNOSIS — Z3A29 29 weeks gestation of pregnancy: Secondary | ICD-10-CM | POA: Diagnosis not present

## 2015-04-22 DIAGNOSIS — O34219 Maternal care for unspecified type scar from previous cesarean delivery: Secondary | ICD-10-CM

## 2015-04-22 DIAGNOSIS — Z6791 Unspecified blood type, Rh negative: Secondary | ICD-10-CM | POA: Diagnosis not present

## 2015-05-05 ENCOUNTER — Encounter: Payer: Medicaid Other | Admitting: Obstetrics and Gynecology

## 2015-05-12 ENCOUNTER — Ambulatory Visit (INDEPENDENT_AMBULATORY_CARE_PROVIDER_SITE_OTHER): Payer: Medicaid Other | Admitting: Obstetrics & Gynecology

## 2015-05-12 ENCOUNTER — Encounter: Payer: Medicaid Other | Attending: Obstetrics & Gynecology | Admitting: *Deleted

## 2015-05-12 VITALS — BP 139/77 | HR 106 | Temp 98.5°F | Wt 182.0 lb

## 2015-05-12 DIAGNOSIS — O24913 Unspecified diabetes mellitus in pregnancy, third trimester: Secondary | ICD-10-CM

## 2015-05-12 DIAGNOSIS — O24919 Unspecified diabetes mellitus in pregnancy, unspecified trimester: Secondary | ICD-10-CM | POA: Insufficient documentation

## 2015-05-12 DIAGNOSIS — Z713 Dietary counseling and surveillance: Secondary | ICD-10-CM | POA: Diagnosis not present

## 2015-05-12 LAB — POCT URINALYSIS DIP (DEVICE)
Bilirubin Urine: NEGATIVE
Glucose, UA: 100 mg/dL — AB
KETONES UR: 40 mg/dL — AB
NITRITE: NEGATIVE
PH: 6 (ref 5.0–8.0)
PROTEIN: NEGATIVE mg/dL
UROBILINOGEN UA: 0.2 mg/dL (ref 0.0–1.0)

## 2015-05-12 MED ORDER — INSULIN ASPART 100 UNIT/ML ~~LOC~~ SOLN: 30.0000 [IU] | Freq: Three times a day (TID) | SUBCUTANEOUS | Status: DC

## 2015-05-12 MED ORDER — ACCU-CHEK FASTCLIX LANCETS MISC: Status: DC

## 2015-05-12 MED ORDER — GLUCOSE BLOOD VI STRP: 1.0000 | ORAL_STRIP | Status: DC | PRN

## 2015-05-12 MED ORDER — INSULIN NPH (HUMAN) (ISOPHANE) 100 UNIT/ML ~~LOC~~ SUSP: 34.0000 [IU] | Freq: Two times a day (BID) | SUBCUTANEOUS | Status: DC

## 2015-05-12 NOTE — Telephone Encounter (Signed)
pateint was seen today for routine appt given instructions on insulin

## 2015-05-12 NOTE — Progress Notes (Signed)
Normal growth at last US, needs testing supplies Subjective:  Cheryl Parker is a 35 y.o. G3P2002 at 7744w5d being seen today for ongoing prenatal care.  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  Patient reports rash on abdomen.  Contractions: Not present. Vag. Bleeding: None.  Movement: Present. Denies leaking of fluid.   The following portions of the patient's history were reviewed and updated as appropriate: allergies, current medications, past family history, past medical history, past social history, past surgical history and problem list. Problem list updated.  Objective:   Filed Vitals:   05/12/15 1035  BP: 139/77  Pulse: 106  Temp: 98.5 F (36.9 C)  Weight: 182 lb (82.555 kg)    Fetal Status: Fetal Heart Rate (bpm): 155   Movement: Present     General:  Alert, oriented and cooperative. Patient is in no acute distress.  Skin: Skin is warm and dry. No rash noted.   Cardiovascular: Normal heart rate noted  Respiratory: Normal respiratory effort, no problems with respiration noted  Abdomen: Soft, gravid, appropriate for gestational age. Pain/Pressure: Present     Pelvic: Vag. Bleeding: None     Cervical exam deferred        Extremities: Normal range of motion.  Edema: Trace  Mental Status: Normal mood and affect. Normal behavior. Normal judgment and thought content.   Urinalysis: Urine  Protein: Negative Urine Glucose: 1+  Assessment and Plan:  Pregnancy: G3P2002 at 7044w5d  1. Diabetes mellitus in pregnancy, antepartum, third trimester FBS up to 103-145 and PP 79-181, will increase NPH Start NST 2/week Preterm labor symptoms and general obstetric precautions including but not limited to vaginal bleeding, contractions, leaking of fluid and fetal movement were reviewed in detail with the patient. Please refer to After Visit Summary for other counseling recommendations.  Return in about 3 days (around 05/15/2015).   Adam PhenixJames G Shirl Ludington, MD

## 2015-05-12 NOTE — Patient Instructions (Signed)

## 2015-05-12 NOTE — Progress Notes (Signed)
Pt reports pressure and pain in vaginal area Pt requests refill on Zofran, test strips, ASA  Observed rash on sides of abdomen pt states may be related to insulin injections

## 2015-05-12 NOTE — Progress Notes (Signed)
Patient presents with Dr. Debroah LoopArnold r/t elevated glucose readings and need to increase insulin. Will increase NPH to AM 34u, PM 40units.  Novolog to 30units with each meal. Reviewed patient dietary intake.  Breakfast: Sausage Biscuit, Hash Brown, Juice Lunch: Burger, fries, Sweet Tea Dinner: fried chicken, Mashed potatoes, peas / Pizza Counseled on nutrition: NO fruit juice, No sugar sweet tea. Limit all other carbohydrate foods. I question compliance of patient.

## 2015-05-15 ENCOUNTER — Other Ambulatory Visit: Payer: Medicaid Other

## 2015-05-16 ENCOUNTER — Ambulatory Visit (INDEPENDENT_AMBULATORY_CARE_PROVIDER_SITE_OTHER): Payer: Medicaid Other | Admitting: *Deleted

## 2015-05-16 VITALS — BP 119/69 | HR 96

## 2015-05-16 DIAGNOSIS — Z36 Encounter for antenatal screening of mother: Secondary | ICD-10-CM

## 2015-05-16 DIAGNOSIS — O24913 Unspecified diabetes mellitus in pregnancy, third trimester: Secondary | ICD-10-CM

## 2015-05-16 NOTE — Progress Notes (Signed)
05/16/2015 NST reviewed and reactive

## 2015-05-16 NOTE — Progress Notes (Signed)
[place] for growth on 1/25 - BPP added so will not need NST on 1/27.  Cx exam per Dr. Marice Potter due to pt reports UC's as painful. Labor precautions given.

## 2015-05-19 ENCOUNTER — Ambulatory Visit (INDEPENDENT_AMBULATORY_CARE_PROVIDER_SITE_OTHER): Payer: Medicaid Other | Admitting: Family Medicine

## 2015-05-19 ENCOUNTER — Encounter: Payer: Self-pay | Admitting: Family Medicine

## 2015-05-19 VITALS — BP 123/68 | HR 94 | Wt 186.1 lb

## 2015-05-19 DIAGNOSIS — O34219 Maternal care for unspecified type scar from previous cesarean delivery: Secondary | ICD-10-CM

## 2015-05-19 DIAGNOSIS — O24913 Unspecified diabetes mellitus in pregnancy, third trimester: Secondary | ICD-10-CM

## 2015-05-19 DIAGNOSIS — O0993 Supervision of high risk pregnancy, unspecified, third trimester: Secondary | ICD-10-CM

## 2015-05-19 DIAGNOSIS — O360130 Maternal care for anti-D [Rh] antibodies, third trimester, not applicable or unspecified: Secondary | ICD-10-CM

## 2015-05-19 LAB — POCT URINALYSIS DIP (DEVICE)
BILIRUBIN URINE: NEGATIVE
Glucose, UA: 100 mg/dL — AB
HGB URINE DIPSTICK: NEGATIVE
Ketones, ur: 15 mg/dL — AB
NITRITE: NEGATIVE
PROTEIN: NEGATIVE mg/dL
Specific Gravity, Urine: >=1.03 (ref 1.005–1.030)
Urobilinogen, UA: 0.2 mg/dL (ref 0.0–1.0)
pH: 6 (ref 5.0–8.0)

## 2015-05-19 MED ORDER — INSULIN NPH (HUMAN) (ISOPHANE) 100 UNIT/ML ~~LOC~~ SUSP: 44.0000 [IU] | Freq: Two times a day (BID) | SUBCUTANEOUS | Status: DC

## 2015-05-19 MED ORDER — INSULIN ASPART 100 UNIT/ML ~~LOC~~ SOLN: 32.0000 [IU] | Freq: Three times a day (TID) | SUBCUTANEOUS | Status: DC

## 2015-05-19 NOTE — Progress Notes (Signed)
[place] for growth and BPP on 1/25

## 2015-05-19 NOTE — Progress Notes (Signed)
Subjective:  Cheryl Parker is a 35 y.o. G3P2002 at [redacted]w[redacted]d being seen today for ongoing prenatal care.  She is currently monitored for the following issues for this high-risk pregnancy and has Type 2 diabetes mellitus with hyperglycemia (HCC); HLD (hyperlipidemia); Dehydration with hyponatremia; Hyperemesis affecting pregnancy, antepartum; Pre-existing Diabetes mellitus in pregnancy, antepartum; Previous cesarean section complicating pregnancy, antepartum condition or complication; Marijuana use; Supervision of high-risk pregnancy; Rh negative, antepartum; Advanced maternal age in multigravida; Previous child with congenital anomaly, currently pregnant, antepartum; and Nausea and vomiting during pregnancy on her problem list.  Patient reports no complaints.  Contractions: Not present.  .  Movement: Present. Denies leaking of fluid.   The following portions of the patient's history were reviewed and updated as appropriate: allergies, current medications, past family history, past medical history, past social history, past surgical history and problem list. Problem list updated.  Objective:   Filed Vitals:   05/19/15 1012  BP: 123/68  Pulse: 94  Weight: 186 lb 1.6 oz (84.414 kg)    Fetal Status: Fetal Heart Rate (bpm): RNST   Movement: Present     General:  Alert, oriented and cooperative. Patient is in no acute distress.  Skin: Skin is warm and dry. No rash noted.   Cardiovascular: Normal heart rate noted  Respiratory: Normal respiratory effort, no problems with respiration noted  Abdomen: Soft, gravid, appropriate for gestational age. Pain/Pressure: Present     Pelvic:       Cervical exam deferred        Extremities: Normal range of motion.     Mental Status: Normal mood and affect. Normal behavior. Normal judgment and thought content.   Urinalysis: Urine Protein: Negative Urine Glucose: 1+ FBS 101-152 2 hour pp 111-191 Most are out NST reviewed and reactive. Assessment and Plan:   Pregnancy: G3P2002 at [redacted]w[redacted]d  1. Diabetes mellitus in pregnancy, antepartum, third trimester Increased NPH and Novolog as listed below-reviewed risks of hyperglycemia including LGA and IUFD with patient continue 2x/wk testing. - Fetal nonstress test - insulin NPH Human (HUMULIN N,NOVOLIN N) 100 UNIT/ML injection; Inject 0.44 mLs (44 Units total) into the skin 2 (two) times daily. q am and q hs  Dispense: 10 mL; Refill: 10 - insulin aspart (NOVOLOG) 100 UNIT/ML injection; Inject 32 Units into the skin 3 (three) times daily with meals.  Dispense: 10 mL; Refill: 10  2. Previous cesarean section complicating pregnancy, antepartum condition or complication Scheduled for RCS  3. Rh negative, antepartum, third trimester, not applicable or unspecified fetus S/p rhogam  4. Supervision of high-risk pregnancy, third trimester Continue prenatal care.   Preterm labor symptoms and general obstetric precautions including but not limited to vaginal bleeding, contractions, leaking of fluid and fetal movement were reviewed in detail with the patient. Please refer to After Visit Summary for other counseling recommendations.  Return in about 7 days (around 05/26/2015) for as scheduled.   Reva Bores, MD

## 2015-05-19 NOTE — Patient Instructions (Addendum)
Following an appropriate diet and keeping your blood sugar under control is the most important thing to do for your health and that of your unborn baby.  Please check your blood sugar 4 times daily.  Please keep accurate BS logs and bring them with you to every visit.  Please bring your meter also.  Goals for Blood sugar should be: 1. Fasting (first thing in the morning before eating) should be less than 90.   2.  2 hours after meals should be less than 120.  Please eat 3 meals and 3 snacks.  Include protein (meat, dairy-cheese, eggs, nuts) with all meals.  Be mindful that carbohydrates increase your blood sugar.  Not just sweet food (cookies, cake, donuts, fruit, juice, soda) but also bread, pasta, rice, and potatoes.  You have to limit how many carbs you are eating.  Adding exercise, as little as 30 minutes a day can decrease your blood sugar.  Gestational Diabetes Mellitus Gestational diabetes mellitus, often simply referred to as gestational diabetes, is a type of diabetes that some women develop during pregnancy. In gestational diabetes, the pancreas does not make enough insulin (a hormone), the cells are less responsive to the insulin that is made (insulin resistance), or both.Normally, insulin moves sugars from food into the tissue cells. The tissue cells use the sugars for energy. The lack of insulin or the lack of normal response to insulin causes excess sugars to build up in the blood instead of going into the tissue cells. As a result, high blood sugar (hyperglycemia) develops. The effect of high sugar (glucose) levels can cause many problems.  RISK FACTORS You have an increased chance of developing gestational diabetes if you have a family history of diabetes and also have one or more of the following risk factors:  A body mass index over 30 (obesity).  A previous pregnancy with gestational diabetes.  An older age at the time of pregnancy. If blood glucose levels are kept in  the normal range during pregnancy, women can have a healthy pregnancy. If your blood glucose levels are not well controlled, there may be risks to you, your unborn baby (fetus), your labor and delivery, or your newborn baby.  SYMPTOMS  If symptoms are experienced, they are much like symptoms you would normally expect during pregnancy. The symptoms of gestational diabetes include:   Increased thirst (polydipsia).  Increased urination (polyuria).  Increased urination during the night (nocturia).  Weight loss. This weight loss may be rapid.  Frequent, recurring infections.  Tiredness (fatigue).  Weakness.  Vision changes, such as blurred vision.  Fruity smell to your breath.  Abdominal pain. DIAGNOSIS Diabetes is diagnosed when blood glucose levels are increased. Your blood glucose level may be checked by one or more of the following blood tests:  A fasting blood glucose test. You will not be allowed to eat for at least 8 hours before a blood sample is taken.  A random blood glucose test. Your blood glucose is checked at any time of the day regardless of when you ate.  An oral glucose tolerance test (OGTT). Your blood glucose is measured after you have not eaten (fasted) for 1-3 hours and then after you drink a glucose-containing beverage. Since the hormones that cause insulin resistance are highest at about 24-28 weeks of a pregnancy, an OGTT is usually performed during that time. If you have risk factors, you may be screened for undiagnosed type 2 diabetes at your first prenatal visit. TREATMENT  Gestational diabetes   should be managed first with diet and exercise. Medicines may be added only if they are needed.  You will need to take diabetes medicine or insulin daily to keep blood glucose levels in the desired range.  You will need to match insulin dosing with exercise and healthy food choices. If you have gestational diabetes, your treatment goal is to maintain the following  blood glucose levels:  Before meals (preprandial): at or below 95 mg/dL.  After meals (postprandial):  One hour after a meal: at or below 140 mg/dL.  Two hours after a meal: at or below 120 mg/dL. If you have pre-existing type 1 or type 2 diabetes, your treatment goal is to maintain the following blood glucose levels:  Before meals, at bedtime, and overnight: 60-99 mg/dL.  After meals: peak of 100-129 mg/dL. HOME CARE INSTRUCTIONS   Have your hemoglobin A1c level checked twice a year.  Perform daily blood glucose monitoring as directed by your health care provider. It is common to perform frequent blood glucose monitoring.  Monitor urine ketones when you are ill and as directed by your health care provider.  Take your diabetes medicine and insulin as directed by your health care provider to maintain your blood glucose level in the desired range.  Never run out of diabetes medicine or insulin. It is needed every day.  Adjust insulin based on your intake of carbohydrates. Carbohydrates can raise blood glucose levels but need to be included in your diet. Carbohydrates provide vitamins, minerals, and fiber which are an essential part of a healthy diet. Carbohydrates are found in fruits, vegetables, whole grains, dairy products, legumes, and foods containing added sugars.  Eat healthy foods. Alternate 3 meals with 3 snacks.  Maintain a healthy weight gain. The usual total expected weight gain varies according to your prepregnancy body mass index (BMI).  Carry a medical alert card or wear your medical alert jewelry.  Carry a 15-gram carbohydrate snack with you at all times to treat low blood glucose (hypoglycemia). Some examples of 15-gram carbohydrate snacks include:  Glucose tablets, 3 or 4.  Glucose gel, 15-gram tube.  Raisins, 2 tablespoons (24 g).  Jelly beans, 6.  Animal crackers, 8.  Fruit juice, regular soda, or low-fat milk, 4 ounces (120 mL).  Gummy treats,  9.  Recognize hypoglycemia. Hypoglycemia during pregnancy occurs with blood glucose levels of 60 mg/dL and below. The risk for hypoglycemia increases when fasting or skipping meals, during or after intense exercise, and during sleep. Hypoglycemia symptoms can include:  Tremors or shakes.  Decreased ability to concentrate.  Sweating.  Increased heart rate.  Headache.  Dry mouth.  Hunger.  Irritability.  Anxiety.  Restless sleep.  Altered speech or coordination.  Confusion.  Treat hypoglycemia promptly. If you are alert and able to safely swallow, follow the 15:15 rule:  Take 15-20 grams of rapid-acting glucose or carbohydrate. Rapid-acting options include glucose gel, glucose tablets, or 4 ounces (120 mL) of fruit juice, regular soda, or low-fat milk.  Check your blood glucose level 15 minutes after taking the glucose.  Take 15-20 grams more of glucose if the repeat blood glucose level is still 70 mg/dL or below.  Eat a meal or snack within 1 hour once blood glucose levels return to normal.  Be alert to polyuria (excess urination) and polydipsia (excess thirst) which are early signs of hyperglycemia. An early awareness of hyperglycemia allows for prompt treatment. Treat hyperglycemia as directed by your health care provider.  Engage in at least   30 minutes of physical activity a day or as directed by your health care provider. Ten minutes of physical activity timed 30 minutes after each meal is encouraged to control postprandial blood glucose levels.  Adjust your insulin dosing and food intake as needed if you start a new exercise or sport.  Follow your sick-day plan at any time you are unable to eat or drink as usual.  Avoid tobacco and alcohol use.  Keep all follow-up visits as directed by your health care provider.  Follow the advice of your health care provider regarding your prenatal and post-delivery (postpartum) appointments, meal planning, exercise, medicines,  vitamins, blood tests, other medical tests, and physical activities.  Perform daily skin and foot care. Examine your skin and feet daily for cuts, bruises, redness, nail problems, bleeding, blisters, or sores.  Brush your teeth and gums at least twice a day and floss at least once a day. Follow up with your dentist regularly.  Schedule an eye exam during the first trimester of your pregnancy or as directed by your health care provider.  Share your diabetes management plan with your workplace or school.  Stay up-to-date with immunizations.  Learn to manage stress.  Obtain ongoing diabetes education and support as needed.  Learn about and consider breastfeeding your baby.  You should have your blood sugar level checked 6-12 weeks after delivery. This is done with an oral glucose tolerance test (OGTT). SEEK MEDICAL CARE IF:   You are unable to eat food or drink fluids for more than 6 hours.  You have nausea and vomiting for more than 6 hours.  You have a blood glucose level of 200 mg/dL and you have ketones in your urine.  There is a change in mental status.  You develop vision problems.  You have a persistent headache.  You have upper abdominal pain or discomfort.  You develop an additional serious illness.  You have diarrhea for more than 6 hours.  You have been sick or have had a fever for a couple of days and are not getting better. SEEK IMMEDIATE MEDICAL CARE IF:   You have difficulty breathing.  You no longer feel the baby moving.  You are bleeding or have discharge from your vagina.  You start having premature contractions or labor. MAKE SURE YOU:  Understand these instructions.  Will watch your condition.  Will get help right away if you are not doing well or get worse.   This information is not intended to replace advice given to you by your health care provider. Make sure you discuss any questions you have with your health care provider.   Document  Released: 07/19/2000 Document Revised: 05/03/2014 Document Reviewed: 11/09/2011 [employer] Interactive Patient Education 2016 [employer]  Breastfeeding Deciding to breastfeed is one of the best choices you can make for you and your baby. A change in hormones during pregnancy causes your breast tissue to grow and increases the number and size of your milk ducts. These hormones also allow proteins, sugars, and fats from your blood supply to make breast milk in your milk-producing glands. Hormones prevent breast milk from being released before your baby is born as well as prompt milk flow after birth. Once breastfeeding has begun, thoughts of your baby, as well as his or her sucking or crying, can stimulate the release of milk from your milk-producing glands.  BENEFITS OF BREASTFEEDING For Your Baby  Your first milk (colostrum) helps your baby's digestive system function better.  There are   antibodies in your milk that help your baby fight off infections.  Your baby has a lower incidence of asthma, allergies, and sudden infant death syndrome.  The nutrients in breast milk are better for your baby than infant formulas and are designed uniquely for your baby's needs.  Breast milk improves your baby's brain development.  Your baby is less likely to develop other conditions, such as childhood obesity, asthma, or type 2 diabetes mellitus. For You  Breastfeeding helps to create a very special bond between you and your baby.  Breastfeeding is convenient. Breast milk is always available at the correct temperature and costs nothing.  Breastfeeding helps to burn calories and helps you lose the weight gained during pregnancy.  Breastfeeding makes your uterus contract to its prepregnancy size faster and slows bleeding (lochia) after you give birth.   Breastfeeding helps to lower your risk of developing type 2 diabetes mellitus, osteoporosis, and breast or ovarian cancer later in life. SIGNS THAT  YOUR BABY IS HUNGRY Early Signs of Hunger  Increased alertness or activity.  Stretching.  Movement of the head from side to side.  Movement of the head and opening of the mouth when the corner of the mouth or cheek is stroked (rooting).  Increased sucking sounds, smacking lips, cooing, sighing, or squeaking.  Hand-to-mouth movements.  Increased sucking of fingers or hands. Late Signs of Hunger  Fussing.  Intermittent crying. Extreme Signs of Hunger Signs of extreme hunger will require calming and consoling before your baby will be able to breastfeed successfully. Do not wait for the following signs of extreme hunger to occur before you initiate breastfeeding:  Restlessness.  A loud, strong cry.  Screaming. BREASTFEEDING BASICS Breastfeeding Initiation  Find a comfortable place to sit or lie down, with your neck and back well supported.  Place a pillow or rolled up blanket under your baby to bring him or her to the level of your breast (if you are seated). Nursing pillows are specially designed to help support your arms and your baby while you breastfeed.  Make sure that your baby's abdomen is facing your abdomen.  Gently massage your breast. With your fingertips, massage from your chest wall toward your nipple in a circular motion. This encourages milk flow. You may need to continue this action during the feeding if your milk flows slowly.  Support your breast with 4 fingers underneath and your thumb above your nipple. Make sure your fingers are well away from your nipple and your baby's mouth.  Stroke your baby's lips gently with your finger or nipple.  When your baby's mouth is open wide enough, quickly bring your baby to your breast, placing your entire nipple and as much of the colored area around your nipple (areola) as possible into your baby's mouth.  More areola should be visible above your baby's upper lip than below the lower lip.  Your baby's tongue should  be between his or her lower gum and your breast.  Ensure that your baby's mouth is correctly positioned around your nipple (latched). Your baby's lips should create a seal on your breast and be turned out (everted).  It is common for your baby to suck about 2-3 minutes in order to start the flow of breast milk. Latching Teaching your baby how to latch on to your breast properly is very important. An improper latch can cause nipple pain and decreased milk supply for you and poor weight gain in your baby. Also, if your baby is not   latched onto your nipple properly, he or she may swallow some air during feeding. This can make your baby fussy. Burping your baby when you switch breasts during the feeding can help to get rid of the air. However, teaching your baby to latch on properly is still the best way to prevent fussiness from swallowing air while breastfeeding. Signs that your baby has successfully latched on to your nipple:  Silent tugging or silent sucking, without causing you pain.  Swallowing heard between every 3-4 sucks.  Muscle movement above and in front of his or her ears while sucking. Signs that your baby has not successfully latched on to nipple:  Sucking sounds or smacking sounds from your baby while breastfeeding.  Nipple pain. If you think your baby has not latched on correctly, slip your finger into the corner of your baby's mouth to break the suction and place it between your baby's gums. Attempt breastfeeding initiation again. Signs of Successful Breastfeeding Signs from your baby:  A gradual decrease in the number of sucks or complete cessation of sucking.  Falling asleep.  Relaxation of his or her body.  Retention of a small amount of milk in his or her mouth.  Letting go of your breast by himself or herself. Signs from you:  Breasts that have increased in firmness, weight, and size 1-3 hours after feeding.  Breasts that are softer immediately after  breastfeeding.  Increased milk volume, as well as a change in milk consistency and color by the fifth day of breastfeeding.  Nipples that are not sore, cracked, or bleeding. Signs That Your Baby is Getting Enough Milk  Wetting at least 3 diapers in a 24-hour period. The urine should be clear and pale yellow by age 5 days.  At least 3 stools in a 24-hour period by age 5 days. The stool should be soft and yellow.  At least 3 stools in a 24-hour period by age 7 days. The stool should be seedy and yellow.  No loss of weight greater than 10% of birth weight during the first 3 days of age.  Average weight gain of 4-7 ounces (113-198 g) per week after age 4 days.  Consistent daily weight gain by age 5 days, without weight loss after the age of 2 weeks. After a feeding, your baby may spit up a small amount. This is common. BREASTFEEDING FREQUENCY AND DURATION Frequent feeding will help you make more milk and can prevent sore nipples and breast engorgement. Breastfeed when you feel the need to reduce the fullness of your breasts or when your baby shows signs of hunger. This is called "breastfeeding on demand." Avoid introducing a pacifier to your baby while you are working to establish breastfeeding (the first 4-6 weeks after your baby is born). After this time you may choose to use a pacifier. Research has shown that pacifier use during the first year of a baby's life decreases the risk of sudden infant death syndrome (SIDS). Allow your baby to feed on each breast as long as he or she wants. Breastfeed until your baby is finished feeding. When your baby unlatches or falls asleep while feeding from the first breast, offer the second breast. Because newborns are often sleepy in the first few weeks of life, you may need to awaken your baby to get him or her to feed. Breastfeeding times will vary from baby to baby. However, the following rules can serve as a guide to help you ensure that your baby is    properly fed:  Newborns (babies 4 weeks of age or younger) may breastfeed every 1-3 hours.  Newborns should not go longer than 3 hours during the day or 5 hours during the night without breastfeeding.  You should breastfeed your baby a minimum of 8 times in a 24-hour period until you begin to introduce solid foods to your baby at around 6 months of age. BREAST MILK PUMPING Pumping and storing breast milk allows you to ensure that your baby is exclusively fed your breast milk, even at times when you are unable to breastfeed. This is especially important if you are going back to work while you are still breastfeeding or when you are not able to be present during feedings. Your lactation consultant can give you guidelines on how long it is safe to store breast milk. A breast pump is a machine that allows you to pump milk from your breast into a sterile bottle. The pumped breast milk can then be stored in a refrigerator or freezer. Some breast pumps are operated by hand, while others use electricity. Ask your lactation consultant which type will work best for you. Breast pumps can be purchased, but some hospitals and breastfeeding support groups lease breast pumps on a monthly basis. A lactation consultant can teach you how to hand express breast milk, if you prefer not to use a pump. CARING FOR YOUR BREASTS WHILE YOU BREASTFEED Nipples can become dry, cracked, and sore while breastfeeding. The following recommendations can help keep your breasts moisturized and healthy:  Avoid using soap on your nipples.  Wear a supportive bra. Although not required, special nursing bras and tank tops are designed to allow access to your breasts for breastfeeding without taking off your entire bra or top. Avoid wearing underwire-style bras or extremely tight bras.  Air dry your nipples for 3-4minutes after each feeding.  Use only cotton bra pads to absorb leaked breast milk. Leaking of breast milk between feedings  is normal.  Use lanolin on your nipples after breastfeeding. Lanolin helps to maintain your skin's normal moisture barrier. If you use pure lanolin, you do not need to wash it off before feeding your baby again. Pure lanolin is not toxic to your baby. You may also hand express a few drops of breast milk and gently massage that milk into your nipples and allow the milk to air dry. In the first few weeks after giving birth, some women experience extremely full breasts (engorgement). Engorgement can make your breasts feel heavy, warm, and tender to the touch. Engorgement peaks within 3-5 days after you give birth. The following recommendations can help ease engorgement:  Completely empty your breasts while breastfeeding or pumping. You may want to start by applying warm, moist heat (in the shower or with warm water-soaked hand towels) just before feeding or pumping. This increases circulation and helps the milk flow. If your baby does not completely empty your breasts while breastfeeding, pump any extra milk after he or she is finished.  Wear a snug bra (nursing or regular) or tank top for 1-2 days to signal your body to slightly decrease milk production.  Apply ice packs to your breasts, unless this is too uncomfortable for you.  Make sure that your baby is latched on and positioned properly while breastfeeding. If engorgement persists after 48 hours of following these recommendations, contact your health care provider or a lactation consultant. OVERALL HEALTH CARE RECOMMENDATIONS WHILE BREASTFEEDING  Eat healthy foods. Alternate between meals and snacks, eating 3   of each per day. Because what you eat affects your breast milk, some of the foods may make your baby more irritable than usual. Avoid eating these foods if you are sure that they are negatively affecting your baby.  Drink milk, fruit juice, and water to satisfy your thirst (about 10 glasses a day).  Rest often, relax, and continue to take  your prenatal vitamins to prevent fatigue, stress, and anemia.  Continue breast self-awareness checks.  Avoid chewing and smoking tobacco. Chemicals from cigarettes that pass into breast milk and exposure to secondhand smoke may harm your baby.  Avoid alcohol and drug use, including marijuana. Some medicines that may be harmful to your baby can pass through breast milk. It is important to ask your health care provider before taking any medicine, including all over-the-counter and prescription medicine as well as vitamin and herbal supplements. It is possible to become pregnant while breastfeeding. If birth control is desired, ask your health care provider about options that will be safe for your baby. SEEK MEDICAL CARE IF:  You feel like you want to stop breastfeeding or have become frustrated with breastfeeding.  You have painful breasts or nipples.  Your nipples are cracked or bleeding.  Your breasts are red, tender, or warm.  You have a swollen area on either breast.  You have a fever or chills.  You have nausea or vomiting.  You have drainage other than breast milk from your nipples.  Your breasts do not become full before feedings by the fifth day after you give birth.  You feel sad and depressed.  Your baby is too sleepy to eat well.  Your baby is having trouble sleeping.   Your baby is wetting less than 3 diapers in a 24-hour period.  Your baby has less than 3 stools in a 24-hour period.  Your baby's skin or the white part of his or her eyes becomes yellow.   Your baby is not gaining weight by 5 days of age. SEEK IMMEDIATE MEDICAL CARE IF:  Your baby is overly tired (lethargic) and does not want to wake up and feed.  Your baby develops an unexplained fever.   This information is not intended to replace advice given to you by your health care provider. Make sure you discuss any questions you have with your health care provider.   Document Released: 04/12/2005  Document Revised: 01/01/2015 Document Reviewed: 10/04/2012 [employer] Interactive Patient Education 2016 [employer]  

## 2015-05-21 ENCOUNTER — Other Ambulatory Visit (HOSPITAL_COMMUNITY): Payer: Self-pay | Admitting: Maternal and Fetal Medicine

## 2015-05-21 ENCOUNTER — Other Ambulatory Visit: Payer: Self-pay | Admitting: Obstetrics and Gynecology

## 2015-05-21 ENCOUNTER — Ambulatory Visit (HOSPITAL_COMMUNITY)
Admission: RE | Admit: 2015-05-21 | Discharge: 2015-05-21 | Disposition: A | Discharge status: Home / Self Care | Payer: Medicaid Other | Source: Ambulatory Visit | Attending: Family Medicine | Admitting: Family Medicine

## 2015-05-21 ENCOUNTER — Encounter (HOSPITAL_COMMUNITY): Payer: Self-pay

## 2015-05-21 DIAGNOSIS — O24113 Pre-existing diabetes mellitus, type 2, in pregnancy, third trimester: Secondary | ICD-10-CM | POA: Diagnosis not present

## 2015-05-21 DIAGNOSIS — O09523 Supervision of elderly multigravida, third trimester: Secondary | ICD-10-CM

## 2015-05-21 DIAGNOSIS — O34219 Maternal care for unspecified type scar from previous cesarean delivery: Secondary | ICD-10-CM | POA: Insufficient documentation

## 2015-05-21 DIAGNOSIS — O26893 Other specified pregnancy related conditions, third trimester: Secondary | ICD-10-CM | POA: Insufficient documentation

## 2015-05-21 DIAGNOSIS — O360130 Maternal care for anti-D [Rh] antibodies, third trimester, not applicable or unspecified: Secondary | ICD-10-CM

## 2015-05-21 DIAGNOSIS — Z3A34 34 weeks gestation of pregnancy: Secondary | ICD-10-CM | POA: Insufficient documentation

## 2015-05-21 DIAGNOSIS — O24313 Unspecified pre-existing diabetes mellitus in pregnancy, third trimester: Secondary | ICD-10-CM

## 2015-05-21 DIAGNOSIS — Z6791 Unspecified blood type, Rh negative: Secondary | ICD-10-CM | POA: Diagnosis not present

## 2015-05-26 ENCOUNTER — Encounter: Payer: Self-pay | Admitting: Obstetrics and Gynecology

## 2015-05-26 ENCOUNTER — Ambulatory Visit (INDEPENDENT_AMBULATORY_CARE_PROVIDER_SITE_OTHER): Payer: Medicaid Other | Admitting: Obstetrics and Gynecology

## 2015-05-26 VITALS — BP 129/76 | HR 98 | Wt 186.8 lb

## 2015-05-26 DIAGNOSIS — E1165 Type 2 diabetes mellitus with hyperglycemia: Secondary | ICD-10-CM

## 2015-05-26 DIAGNOSIS — O09523 Supervision of elderly multigravida, third trimester: Secondary | ICD-10-CM

## 2015-05-26 DIAGNOSIS — O360130 Maternal care for anti-D [Rh] antibodies, third trimester, not applicable or unspecified: Secondary | ICD-10-CM

## 2015-05-26 DIAGNOSIS — O34219 Maternal care for unspecified type scar from previous cesarean delivery: Secondary | ICD-10-CM

## 2015-05-26 DIAGNOSIS — O0993 Supervision of high risk pregnancy, unspecified, third trimester: Secondary | ICD-10-CM

## 2015-05-26 DIAGNOSIS — O24913 Unspecified diabetes mellitus in pregnancy, third trimester: Secondary | ICD-10-CM | POA: Diagnosis not present

## 2015-05-26 DIAGNOSIS — O352XX0 Maternal care for (suspected) hereditary disease in fetus, not applicable or unspecified: Secondary | ICD-10-CM | POA: Diagnosis not present

## 2015-05-26 LAB — POCT URINALYSIS DIP (DEVICE)
BILIRUBIN URINE: NEGATIVE
Glucose, UA: NEGATIVE mg/dL
HGB URINE DIPSTICK: NEGATIVE
KETONES UR: 80 mg/dL — AB
NITRITE: NEGATIVE
PH: 6 (ref 5.0–8.0)
Protein, ur: 30 mg/dL — AB
Specific Gravity, Urine: >=1.03 (ref 1.005–1.030)
Urobilinogen, UA: 0.2 mg/dL (ref 0.0–1.0)

## 2015-05-26 NOTE — Progress Notes (Signed)
Subjective:  Cheryl Parker is a 35 y.o. G3P2002 at [redacted]w[redacted]d being seen today for ongoing prenatal care.  She is currently monitored for the following issues for this high-risk pregnancy and has Type 2 diabetes mellitus with hyperglycemia (HCC); HLD (hyperlipidemia); Dehydration with hyponatremia; Hyperemesis affecting pregnancy, antepartum; Pre-existing Diabetes mellitus in pregnancy, antepartum; Previous cesarean section complicating pregnancy, antepartum condition or complication; Marijuana use; Supervision of high-risk pregnancy; Rh negative, antepartum; Advanced maternal age in multigravida; Previous child with congenital anomaly, currently pregnant, antepartum; and Nausea and vomiting during pregnancy on her problem list.  Patient reports no complaints.  Contractions: Not present. Vag. Bleeding: None.  Movement: Present. Denies leaking of fluid.   The following portions of the patient's history were reviewed and updated as appropriate: allergies, current medications, past family history, past medical history, past social history, past surgical history and problem list. Problem list updated.  Objective:   Filed Vitals:   05/26/15 0927  BP: 129/76  Pulse: 98  Weight: 186 lb 12.8 oz (84.732 kg)    Fetal Status: Fetal Heart Rate (bpm): RNST   Movement: Present     General:  Alert, oriented and cooperative. Patient is in no acute distress.  Skin: Skin is warm and dry. No rash noted.   Cardiovascular: Normal heart rate noted  Respiratory: Normal respiratory effort, no problems with respiration noted  Abdomen: Soft, gravid, appropriate for gestational age. Pain/Pressure: Present     Pelvic: Vag. Bleeding: None     Cervical exam deferred        Extremities: Normal range of motion.  Edema: Trace  Mental Status: Normal mood and affect. Normal behavior. Normal judgment and thought content.   Urinalysis:      Assessment and Plan:  Pregnancy: G3P2002 at [redacted]w[redacted]d  1. Diabetes mellitus in  pregnancy, antepartum, third trimester CBGs reviewed and patient is not adhering to diabetic diet. She consumes spaghetti, hot dogs, ice cream and fruit loops as a bedtime snacks. She has recently changed her bedtime snacks to crackers with peanut butter and her fasting are now 75, instead of 123-193. Her postprandials are within range when she adheres to the diet otherwise 180-223.  I re-educated the patient on the importance of adhering to the diet and the fetal impact of poorly controlled diabetes. Patient declined nutritional counseling. No change was made to the current insulin regimen. Advised the patient to adhere to the diet for the next few weeks of her pregnancy 05/21/2015 EFW 2306 gm (57%tile) - Fetal nonstress test- reviewed and   2. Type 2 diabetes mellitus with hyperglycemia, unspecified long term insulin use status (HCC)   3. Supervision of high-risk pregnancy, third trimester   4. Rh negative, antepartum, third trimester, not applicable or unspecified fetus S/p rhogam  5. Previous cesarean section complicating pregnancy, antepartum condition or complication Scheduled for repeat on 06/25/2015  6. Previous child with congenital anomaly, currently pregnant, antepartum, not applicable or unspecified fetus   7. Advanced maternal age in multigravida, third trimester   Preterm labor symptoms and general obstetric precautions including but not limited to vaginal bleeding, contractions, leaking of fluid and fetal movement were reviewed in detail with the patient. Please refer to After Visit Summary for other counseling recommendations.  Return in about 4 days (around 05/30/2015) for 2x/wk as scheduled.   Catalina Antigua, MD

## 2015-05-26 NOTE — Progress Notes (Signed)
[place] for growth done 1/26.  Pt requests Rx refill of Zofran ODT.

## 2015-05-30 ENCOUNTER — Ambulatory Visit (INDEPENDENT_AMBULATORY_CARE_PROVIDER_SITE_OTHER): Payer: Medicaid Other | Admitting: *Deleted

## 2015-05-30 DIAGNOSIS — Z36 Encounter for antenatal screening of mother: Secondary | ICD-10-CM | POA: Diagnosis not present

## 2015-05-30 DIAGNOSIS — O24913 Unspecified diabetes mellitus in pregnancy, third trimester: Secondary | ICD-10-CM | POA: Diagnosis present

## 2015-05-30 NOTE — Progress Notes (Signed)
NST reviewed and reactive.  

## 2015-06-02 ENCOUNTER — Other Ambulatory Visit (HOSPITAL_COMMUNITY)
Admission: RE | Admit: 2015-06-02 | Discharge: 2015-06-02 | Disposition: A | Discharge status: Home / Self Care | Payer: Medicaid Other | Source: Ambulatory Visit | Attending: Family Medicine | Admitting: Family Medicine

## 2015-06-02 ENCOUNTER — Ambulatory Visit (INDEPENDENT_AMBULATORY_CARE_PROVIDER_SITE_OTHER): Payer: Medicaid Other | Admitting: Family Medicine

## 2015-06-02 VITALS — BP 129/79 | HR 86 | Wt 185.1 lb

## 2015-06-02 DIAGNOSIS — O34219 Maternal care for unspecified type scar from previous cesarean delivery: Secondary | ICD-10-CM | POA: Diagnosis not present

## 2015-06-02 DIAGNOSIS — O24913 Unspecified diabetes mellitus in pregnancy, third trimester: Secondary | ICD-10-CM

## 2015-06-02 DIAGNOSIS — Z113 Encounter for screening for infections with a predominantly sexual mode of transmission: Secondary | ICD-10-CM | POA: Insufficient documentation

## 2015-06-02 DIAGNOSIS — O0993 Supervision of high risk pregnancy, unspecified, third trimester: Secondary | ICD-10-CM

## 2015-06-02 LAB — FETAL NONSTRESS TEST

## 2015-06-02 LAB — POCT URINALYSIS DIP (DEVICE)
Bilirubin Urine: NEGATIVE
GLUCOSE, UA: 100 mg/dL — AB
Hgb urine dipstick: NEGATIVE
Nitrite: NEGATIVE
PH: 6 (ref 5.0–8.0)
PROTEIN: NEGATIVE mg/dL
UROBILINOGEN UA: 0.2 mg/dL (ref 0.0–1.0)

## 2015-06-02 MED ORDER — INSULIN NPH (HUMAN) (ISOPHANE) 100 UNIT/ML ~~LOC~~ SUSP: 50.0000 [IU] | Freq: Two times a day (BID) | SUBCUTANEOUS | Status: DC

## 2015-06-02 NOTE — Progress Notes (Signed)
Subjective:  Cheryl Parker is a 35 y.o. G3P2002 at [redacted]w[redacted]d being seen today for ongoing prenatal care.  She is currently monitored for the following issues for this high-risk pregnancy and has Type 2 diabetes mellitus with hyperglycemia (HCC); HLD (hyperlipidemia); Dehydration with hyponatremia; Hyperemesis affecting pregnancy, antepartum; Pre-existing Diabetes mellitus in pregnancy, antepartum; Previous cesarean section complicating pregnancy, antepartum condition or complication; Marijuana use; Supervision of high-risk pregnancy; Rh negative, antepartum; Advanced maternal age in multigravida; Previous child with congenital anomaly, currently pregnant, antepartum; and Nausea and vomiting during pregnancy on her problem list.   DM: Patient taking insulin NPH 44u, Regular 32u with breakfast, lunch, dinner, and bedtime.  Reports no hypoglycemic episodes.  Tolerating medication well Fasting: all elevated  2hr PP: most elevated   Patient reports no complaints.  Contractions: Not present. Vag. Bleeding: None.  Movement: Present. Denies leaking of fluid.   The following portions of the patient's history were reviewed and updated as appropriate: allergies, current medications, past family history, past medical history, past social history, past surgical history and problem list. Problem list updated.  Objective:   Filed Vitals:   06/02/15 0850  BP: 129/79  Pulse: 86  Weight: 185 lb 1.6 oz (83.961 kg)    Fetal Status: Fetal Heart Rate (bpm): NST   Movement: Present     General:  Alert, oriented and cooperative. Patient is in no acute distress.  Skin: Skin is warm and dry. No rash noted.   Cardiovascular: Normal heart rate noted  Respiratory: Normal respiratory effort, no problems with respiration noted  Abdomen: Soft, gravid, appropriate for gestational age. Pain/Pressure: Present     Pelvic: Vag. Bleeding: None     Cervical exam performed        Extremities: Normal range of motion.  Edema:  Trace  Mental Status: Normal mood and affect. Normal behavior. Normal judgment and thought content.   Urinalysis: Urine Protein: Negative Urine Glucose: 1+  Assessment and Plan:  Pregnancy: G3P2002 at [redacted]w[redacted]d  1. Diabetes mellitus in pregnancy, antepartum, third trimester NST reactive.  With patient's blood sugars not being controlled and taking regular at bedtime in addition to dinner - told patient to stop taking regular insulin at bedtime and increase NPH to 50u BID.  Check blood sugars on Friday Continue NST twice weekly  2. Supervision of high-risk pregnancy, third trimester NST noraml  3. Previous cesarean section complicating pregnancy, antepartum condition or complication   Preterm labor symptoms and general obstetric precautions including but not limited to vaginal bleeding, contractions, leaking of fluid and fetal movement were reviewed in detail with the patient. Please refer to After Visit Summary for other counseling recommendations.  Return in about 4 days (around 06/06/2015) for 2x/wk as scheduled.   Levie Heritage, DO

## 2015-06-02 NOTE — Patient Instructions (Signed)
Breastfeeding Deciding to breastfeed is one of the best choices you can make for you and your baby. A change in hormones during pregnancy causes your breast tissue to grow and increases the number and size of your milk ducts. These hormones also allow proteins, sugars, and fats from your blood supply to make breast milk in your milk-producing glands. Hormones prevent breast milk from being released before your baby is born as well as prompt milk flow after birth. Once breastfeeding has begun, thoughts of your baby, as well as his or her sucking or crying, can stimulate the release of milk from your milk-producing glands.  BENEFITS OF BREASTFEEDING For Your Baby  Your first milk (colostrum) helps your baby's digestive system function better.  There are antibodies in your milk that help your baby fight off infections.  Your baby has a lower incidence of asthma, allergies, and sudden infant death syndrome.  The nutrients in breast milk are better for your baby than infant formulas and are designed uniquely for your baby's needs.  Breast milk improves your baby's brain development.  Your baby is less likely to develop other conditions, such as childhood obesity, asthma, or type 2 diabetes mellitus. For You  Breastfeeding helps to create a very special bond between you and your baby.  Breastfeeding is convenient. Breast milk is always available at the correct temperature and costs nothing.  Breastfeeding helps to burn calories and helps you lose the weight gained during pregnancy.  Breastfeeding makes your uterus contract to its prepregnancy size faster and slows bleeding (lochia) after you give birth.   Breastfeeding helps to lower your risk of developing type 2 diabetes mellitus, osteoporosis, and breast or ovarian cancer later in life. SIGNS THAT YOUR BABY IS HUNGRY Early Signs of Hunger  Increased alertness or activity.  Stretching.  Movement of the head from side to  side.  Movement of the head and opening of the mouth when the corner of the mouth or cheek is stroked (rooting).  Increased sucking sounds, smacking lips, cooing, sighing, or squeaking.  Hand-to-mouth movements.  Increased sucking of fingers or hands. Late Signs of Hunger  Fussing.  Intermittent crying. Extreme Signs of Hunger Signs of extreme hunger will require calming and consoling before your baby will be able to breastfeed successfully. Do not wait for the following signs of extreme hunger to occur before you initiate breastfeeding:  Restlessness.  A loud, strong cry.  Screaming. BREASTFEEDING BASICS Breastfeeding Initiation  Find a comfortable place to sit or lie down, with your neck and back well supported.  Place a pillow or rolled up blanket under your baby to bring him or her to the level of your breast (if you are seated). Nursing pillows are specially designed to help support your arms and your baby while you breastfeed.  Make sure that your baby's abdomen is facing your abdomen.  Gently massage your breast. With your fingertips, massage from your chest wall toward your nipple in a circular motion. This encourages milk flow. You may need to continue this action during the feeding if your milk flows slowly.  Support your breast with 4 fingers underneath and your thumb above your nipple. Make sure your fingers are well away from your nipple and your baby's mouth.  Stroke your baby's lips gently with your finger or nipple.  When your baby's mouth is open wide enough, quickly bring your baby to your breast, placing your entire nipple and as much of the colored area around your nipple (  areola) as possible into your baby's mouth.  More areola should be visible above your baby's upper lip than below the lower lip.  Your baby's tongue should be between his or her lower gum and your breast.  Ensure that your baby's mouth is correctly positioned around your nipple  (latched). Your baby's lips should create a seal on your breast and be turned out (everted).  It is common for your baby to suck about 2-3 minutes in order to start the flow of breast milk. Latching Teaching your baby how to latch on to your breast properly is very important. An improper latch can cause nipple pain and decreased milk supply for you and poor weight gain in your baby. Also, if your baby is not latched onto your nipple properly, he or she may swallow some air during feeding. This can make your baby fussy. Burping your baby when you switch breasts during the feeding can help to get rid of the air. However, teaching your baby to latch on properly is still the best way to prevent fussiness from swallowing air while breastfeeding. Signs that your baby has successfully latched on to your nipple:  Silent tugging or silent sucking, without causing you pain.  Swallowing heard between every 3-4 sucks.  Muscle movement above and in front of his or her ears while sucking. Signs that your baby has not successfully latched on to nipple:  Sucking sounds or smacking sounds from your baby while breastfeeding.  Nipple pain. If you think your baby has not latched on correctly, slip your finger into the corner of your baby's mouth to break the suction and place it between your baby's gums. Attempt breastfeeding initiation again. Signs of Successful Breastfeeding Signs from your baby:  A gradual decrease in the number of sucks or complete cessation of sucking.  Falling asleep.  Relaxation of his or her body.  Retention of a small amount of milk in his or her mouth.  Letting go of your breast by himself or herself. Signs from you:  Breasts that have increased in firmness, weight, and size 1-3 hours after feeding.  Breasts that are softer immediately after breastfeeding.  Increased milk volume, as well as a change in milk consistency and color by the fifth day of breastfeeding.  Nipples  that are not sore, cracked, or bleeding. Signs That Your Baby is Getting Enough Milk  Wetting at least 3 diapers in a 24-hour period. The urine should be clear and pale yellow by age 5 days.  At least 3 stools in a 24-hour period by age 5 days. The stool should be soft and yellow.  At least 3 stools in a 24-hour period by age 7 days. The stool should be seedy and yellow.  No loss of weight greater than 10% of birth weight during the first 3 days of age.  Average weight gain of 4-7 ounces (113-198 g) per week after age 4 days.  Consistent daily weight gain by age 5 days, without weight loss after the age of 2 weeks. After a feeding, your baby may spit up a small amount. This is common. BREASTFEEDING FREQUENCY AND DURATION Frequent feeding will help you make more milk and can prevent sore nipples and breast engorgement. Breastfeed when you feel the need to reduce the fullness of your breasts or when your baby shows signs of hunger. This is called "breastfeeding on demand." Avoid introducing a pacifier to your baby while you are working to establish breastfeeding (the first 4-6 weeks   after your baby is born). After this time you may choose to use a pacifier. Research has shown that pacifier use during the first year of a baby's life decreases the risk of sudden infant death syndrome (SIDS). Allow your baby to feed on each breast as long as he or she wants. Breastfeed until your baby is finished feeding. When your baby unlatches or falls asleep while feeding from the first breast, offer the second breast. Because newborns are often sleepy in the first few weeks of life, you may need to awaken your baby to get him or her to feed. Breastfeeding times will vary from baby to baby. However, the following rules can serve as a guide to help you ensure that your baby is properly fed:  Newborns (babies 4 weeks of age or younger) may breastfeed every 1-3 hours.  Newborns should not go longer than 3 hours  during the day or 5 hours during the night without breastfeeding.  You should breastfeed your baby a minimum of 8 times in a 24-hour period until you begin to introduce solid foods to your baby at around 6 months of age. BREAST MILK PUMPING Pumping and storing breast milk allows you to ensure that your baby is exclusively fed your breast milk, even at times when you are unable to breastfeed. This is especially important if you are going back to work while you are still breastfeeding or when you are not able to be present during feedings. Your lactation consultant can give you guidelines on how long it is safe to store breast milk. A breast pump is a machine that allows you to pump milk from your breast into a sterile bottle. The pumped breast milk can then be stored in a refrigerator or freezer. Some breast pumps are operated by hand, while others use electricity. Ask your lactation consultant which type will work best for you. Breast pumps can be purchased, but some hospitals and breastfeeding support groups lease breast pumps on a monthly basis. A lactation consultant can teach you how to hand express breast milk, if you prefer not to use a pump. CARING FOR YOUR BREASTS WHILE YOU BREASTFEED Nipples can become dry, cracked, and sore while breastfeeding. The following recommendations can help keep your breasts moisturized and healthy:  Avoid using soap on your nipples.  Wear a supportive bra. Although not required, special nursing bras and tank tops are designed to allow access to your breasts for breastfeeding without taking off your entire bra or top. Avoid wearing underwire-style bras or extremely tight bras.  Air dry your nipples for 3-4minutes after each feeding.  Use only cotton bra pads to absorb leaked breast milk. Leaking of breast milk between feedings is normal.  Use lanolin on your nipples after breastfeeding. Lanolin helps to maintain your skin's normal moisture barrier. If you use  pure lanolin, you do not need to wash it off before feeding your baby again. Pure lanolin is not toxic to your baby. You may also hand express a few drops of breast milk and gently massage that milk into your nipples and allow the milk to air dry. In the first few weeks after giving birth, some women experience extremely full breasts (engorgement). Engorgement can make your breasts feel heavy, warm, and tender to the touch. Engorgement peaks within 3-5 days after you give birth. The following recommendations can help ease engorgement:  Completely empty your breasts while breastfeeding or pumping. You may want to start by applying warm, moist heat (in   the shower or with warm water-soaked hand towels) just before feeding or pumping. This increases circulation and helps the milk flow. If your baby does not completely empty your breasts while breastfeeding, pump any extra milk after he or she is finished.  Wear a snug bra (nursing or regular) or tank top for 1-2 days to signal your body to slightly decrease milk production.  Apply ice packs to your breasts, unless this is too uncomfortable for you.  Make sure that your baby is latched on and positioned properly while breastfeeding. If engorgement persists after 48 hours of following these recommendations, contact your health care provider or a lactation consultant. OVERALL HEALTH CARE RECOMMENDATIONS WHILE BREASTFEEDING  Eat healthy foods. Alternate between meals and snacks, eating 3 of each per day. Because what you eat affects your breast milk, some of the foods may make your baby more irritable than usual. Avoid eating these foods if you are sure that they are negatively affecting your baby.  Drink milk, fruit juice, and water to satisfy your thirst (about 10 glasses a day).  Rest often, relax, and continue to take your prenatal vitamins to prevent fatigue, stress, and anemia.  Continue breast self-awareness checks.  Avoid chewing and smoking  tobacco. Chemicals from cigarettes that pass into breast milk and exposure to secondhand smoke may harm your baby.  Avoid alcohol and drug use, including marijuana. Some medicines that may be harmful to your baby can pass through breast milk. It is important to ask your health care provider before taking any medicine, including all over-the-counter and prescription medicine as well as vitamin and herbal supplements. It is possible to become pregnant while breastfeeding. If birth control is desired, ask your health care provider about options that will be safe for your baby. SEEK MEDICAL CARE IF:  You feel like you want to stop breastfeeding or have become frustrated with breastfeeding.  You have painful breasts or nipples.  Your nipples are cracked or bleeding.  Your breasts are red, tender, or warm.  You have a swollen area on either breast.  You have a fever or chills.  You have nausea or vomiting.  You have drainage other than breast milk from your nipples.  Your breasts do not become full before feedings by the fifth day after you give birth.  You feel sad and depressed.  Your baby is too sleepy to eat well.  Your baby is having trouble sleeping.   Your baby is wetting less than 3 diapers in a 24-hour period.  Your baby has less than 3 stools in a 24-hour period.  Your baby's skin or the white part of his or her eyes becomes yellow.   Your baby is not gaining weight by 5 days of age. SEEK IMMEDIATE MEDICAL CARE IF:  Your baby is overly tired (lethargic) and does not want to wake up and feed.  Your baby develops an unexplained fever.   This information is not intended to replace advice given to you by your health care provider. Make sure you discuss any questions you have with your health care provider.   Document Released: 04/12/2005 Document Revised: 01/01/2015 Document Reviewed: 10/04/2012 Elsevier Interactive Patient Education 2016 [employer]  

## 2015-06-03 LAB — GC/CHLAMYDIA PROBE AMP (~~LOC~~) NOT AT ARMC
Chlamydia: NEGATIVE
Neisseria Gonorrhea: NEGATIVE

## 2015-06-03 LAB — CULTURE, BETA STREP (GROUP B ONLY)

## 2015-06-06 ENCOUNTER — Ambulatory Visit (INDEPENDENT_AMBULATORY_CARE_PROVIDER_SITE_OTHER): Payer: Medicaid Other | Admitting: *Deleted

## 2015-06-06 VITALS — BP 131/84 | HR 89

## 2015-06-06 DIAGNOSIS — O24913 Unspecified diabetes mellitus in pregnancy, third trimester: Secondary | ICD-10-CM | POA: Diagnosis present

## 2015-06-06 DIAGNOSIS — Z36 Encounter for antenatal screening of mother: Secondary | ICD-10-CM

## 2015-06-06 NOTE — Progress Notes (Signed)
NST reactive.

## 2015-06-06 NOTE — Progress Notes (Signed)
CBG log reviewed by Dr. Debroah Loop - still several elevated values but improving since change in insulin dosage from 2/6. No changes for now- will re-evaluate on 2/13.  Pt encouraged to adhere to diet.

## 2015-06-09 ENCOUNTER — Ambulatory Visit (INDEPENDENT_AMBULATORY_CARE_PROVIDER_SITE_OTHER): Payer: Medicaid Other | Admitting: Obstetrics and Gynecology

## 2015-06-09 ENCOUNTER — Encounter: Payer: Self-pay | Admitting: Obstetrics and Gynecology

## 2015-06-09 VITALS — BP 127/77 | HR 88 | Wt 185.4 lb

## 2015-06-09 DIAGNOSIS — O34219 Maternal care for unspecified type scar from previous cesarean delivery: Secondary | ICD-10-CM | POA: Diagnosis not present

## 2015-06-09 DIAGNOSIS — O360130 Maternal care for anti-D [Rh] antibodies, third trimester, not applicable or unspecified: Secondary | ICD-10-CM

## 2015-06-09 DIAGNOSIS — O0993 Supervision of high risk pregnancy, unspecified, third trimester: Secondary | ICD-10-CM

## 2015-06-09 DIAGNOSIS — E1165 Type 2 diabetes mellitus with hyperglycemia: Secondary | ICD-10-CM

## 2015-06-09 DIAGNOSIS — O24913 Unspecified diabetes mellitus in pregnancy, third trimester: Secondary | ICD-10-CM

## 2015-06-09 LAB — POCT URINALYSIS DIP (DEVICE)
Glucose, UA: 500 mg/dL — AB
HGB URINE DIPSTICK: NEGATIVE
KETONES UR: 40 mg/dL — AB
Nitrite: NEGATIVE
PH: 6 (ref 5.0–8.0)
Protein, ur: 30 mg/dL — AB
Urobilinogen, UA: 0.2 mg/dL (ref 0.0–1.0)

## 2015-06-09 NOTE — Progress Notes (Signed)
Subjective:  Cheryl Parker is a 35 y.o. G3P2002 at [redacted]w[redacted]d being seen today for ongoing prenatal care.  She is currently monitored for the following issues for this high-risk pregnancy and has Type 2 diabetes mellitus with hyperglycemia (HCC); HLD (hyperlipidemia); Dehydration with hyponatremia; Hyperemesis affecting pregnancy, antepartum; Pre-existing Diabetes mellitus in pregnancy, antepartum; Previous cesarean section complicating pregnancy, antepartum condition or complication; Marijuana use; Supervision of high-risk pregnancy; Rh negative, antepartum; Advanced maternal age in multigravida; Previous child with congenital anomaly, currently pregnant, antepartum; and Nausea and vomiting during pregnancy on her problem list.  Patient reports no complaints.  Contractions: Not present. Vag. Bleeding: None.  Movement: Present. Denies leaking of fluid.   The following portions of the patient's history were reviewed and updated as appropriate: allergies, current medications, past family history, past medical history, past social history, past surgical history and problem list. Problem list updated.  Objective:   Filed Vitals:   06/09/15 0845  BP: 127/77  Pulse: 88  Weight: 185 lb 6.4 oz (84.097 kg)    Fetal Status: Fetal Heart Rate (bpm): NST   Movement: Present     General:  Alert, oriented and cooperative. Patient is in no acute distress.  Skin: Skin is warm and dry. No rash noted.   Cardiovascular: Normal heart rate noted  Respiratory: Normal respiratory effort, no problems with respiration noted  Abdomen: Soft, gravid, appropriate for gestational age. Pain/Pressure: Present     Pelvic: Vag. Bleeding: None     Cervical exam deferred        Extremities: Normal range of motion.  Edema: Trace  Mental Status: Normal mood and affect. Normal behavior. Normal judgment and thought content.   Urinalysis:      Assessment and Plan:  Pregnancy: G3P2002 at [redacted]w[redacted]d  1. Diabetes mellitus in pregnancy,  antepartum, third trimester CBGs reviewed and most out of range Fasting 81-115  2hr pp 102-219 Will increase NPH 52 BID Advised to monitor food intake and to increase exercise Will schedule growth ultrasound NST reviewed and reactive  2. Previous cesarean section complicating pregnancy, antepartum condition or complication Scheduled for repeat on 3/1  3. Rh negative, antepartum, third trimester, not applicable or unspecified fetus S/p rhogam  4. Supervision of high-risk pregnancy, third trimester   5. Type 2 diabetes mellitus with hyperglycemia, unspecified long term insulin use status (HCC)   Preterm labor symptoms and general obstetric precautions including but not limited to vaginal bleeding, contractions, leaking of fluid and fetal movement were reviewed in detail with the patient. Please refer to After Visit Summary for other counseling recommendations.  Return in about 1 week (around 06/16/2015).   Catalina Antigua, MD

## 2015-06-09 NOTE — Progress Notes (Signed)
Rpt C/S scheduled 3/1

## 2015-06-13 ENCOUNTER — Ambulatory Visit (INDEPENDENT_AMBULATORY_CARE_PROVIDER_SITE_OTHER): Payer: Medicaid Other | Admitting: *Deleted

## 2015-06-13 DIAGNOSIS — O24913 Unspecified diabetes mellitus in pregnancy, third trimester: Secondary | ICD-10-CM

## 2015-06-13 DIAGNOSIS — Z36 Encounter for antenatal screening of mother: Secondary | ICD-10-CM | POA: Diagnosis not present

## 2015-06-13 NOTE — Progress Notes (Signed)
NST performed today was reviewed and was found to be reactive.  AFI normal at 18.2 cm.  Continue recommended antenatal testing and prenatal care.

## 2015-06-14 ENCOUNTER — Encounter: Payer: Self-pay | Admitting: *Deleted

## 2015-06-16 ENCOUNTER — Ambulatory Visit (INDEPENDENT_AMBULATORY_CARE_PROVIDER_SITE_OTHER): Payer: Medicaid Other | Admitting: Obstetrics and Gynecology

## 2015-06-16 ENCOUNTER — Encounter: Payer: Self-pay | Admitting: Obstetrics and Gynecology

## 2015-06-16 VITALS — BP 129/75 | HR 86 | Wt 184.2 lb

## 2015-06-16 DIAGNOSIS — E1165 Type 2 diabetes mellitus with hyperglycemia: Secondary | ICD-10-CM | POA: Diagnosis not present

## 2015-06-16 DIAGNOSIS — O09523 Supervision of elderly multigravida, third trimester: Secondary | ICD-10-CM | POA: Diagnosis not present

## 2015-06-16 DIAGNOSIS — O24913 Unspecified diabetes mellitus in pregnancy, third trimester: Secondary | ICD-10-CM

## 2015-06-16 DIAGNOSIS — O360130 Maternal care for anti-D [Rh] antibodies, third trimester, not applicable or unspecified: Secondary | ICD-10-CM

## 2015-06-16 DIAGNOSIS — O34219 Maternal care for unspecified type scar from previous cesarean delivery: Secondary | ICD-10-CM | POA: Diagnosis not present

## 2015-06-16 DIAGNOSIS — O0993 Supervision of high risk pregnancy, unspecified, third trimester: Secondary | ICD-10-CM

## 2015-06-16 LAB — POCT URINALYSIS DIP (DEVICE)
Bilirubin Urine: NEGATIVE
Glucose, UA: 100 mg/dL — AB
HGB URINE DIPSTICK: NEGATIVE
Ketones, ur: 40 mg/dL — AB
Nitrite: NEGATIVE
PH: 6 (ref 5.0–8.0)
PROTEIN: 30 mg/dL — AB
Urobilinogen, UA: 0.2 mg/dL (ref 0.0–1.0)

## 2015-06-16 NOTE — Progress Notes (Signed)
Subjective:  Robbi Spells is a 35 y.o. G3P2002 at [redacted]w[redacted]d being seen today for ongoing prenatal care.  She is currently monitored for the following issues for this high-risk pregnancy and has Type 2 diabetes mellitus with hyperglycemia (HCC); HLD (hyperlipidemia); Dehydration with hyponatremia; Hyperemesis affecting pregnancy, antepartum; Pre-existing Diabetes mellitus in pregnancy, antepartum; Previous cesarean section complicating pregnancy, antepartum condition or complication; Marijuana use; Supervision of high-risk pregnancy; Rh negative, antepartum; Advanced maternal age in multigravida; Previous child with congenital anomaly, currently pregnant, antepartum; and Nausea and vomiting during pregnancy on her problem list.  Patient reports no complaints.  Contractions: Irregular. Vag. Bleeding: None.  Movement: Present. Denies leaking of fluid.   The following portions of the patient's history were reviewed and updated as appropriate: allergies, current medications, past family history, past medical history, past social history, past surgical history and problem list. Problem list updated.  Objective:   Filed Vitals:   06/16/15 0959  BP: 129/75  Pulse: 86  Weight: 184 lb 3.2 oz (83.553 kg)    Fetal Status: Fetal Heart Rate (bpm): NST   Movement: Present     General:  Alert, oriented and cooperative. Patient is in no acute distress.  Skin: Skin is warm and dry. No rash noted.   Cardiovascular: Normal heart rate noted  Respiratory: Normal respiratory effort, no problems with respiration noted  Abdomen: Soft, gravid, appropriate for gestational age. Pain/Pressure: Present     Pelvic: Vag. Bleeding: None     Cervical exam deferred        Extremities: Normal range of motion.  Edema: Trace  Mental Status: Normal mood and affect. Normal behavior. Normal judgment and thought content.   Urinalysis: Urine Protein: 1+ Urine Glucose: 1+  Assessment and Plan:  Pregnancy: G3P2002 at [redacted]w[redacted]d  1.  Diabetes mellitus in pregnancy, antepartum, third trimester CBGs reviewed and all out of range except for yesterday and today's fasting which are perfect. Patient states that she has changed her diet and feels that she has not been administrating the insulin appropriately. She feels that it has always been subcutaneous. Will monitor at current NPH dosing of 52 BID Follow up growth ultrasound 2/23 - Fetal nonstress test- reviewed and reactive  2. Type 2 diabetes mellitus with hyperglycemia, unspecified long term insulin use status (HCC)   3. Supervision of high-risk pregnancy, third trimester   4. Rh negative, antepartum, third trimester, not applicable or unspecified fetus S/p rhogam  5. Previous cesarean section complicating pregnancy, antepartum condition or complication Scheduled for repeat on 06/25/2015  6. Advanced maternal age in multigravida, third trimester   Term labor symptoms and general obstetric precautions including but not limited to vaginal bleeding, contractions, leaking of fluid and fetal movement were reviewed in detail with the patient. Please refer to After Visit Summary for other counseling recommendations.  Return in about 3 days (around 06/19/2015) for 2x/wk as scheduled.   Catalina Antigua, MD

## 2015-06-16 NOTE — Progress Notes (Signed)
[place] for growth on 2/23

## 2015-06-17 ENCOUNTER — Encounter: Payer: Self-pay | Admitting: *Deleted

## 2015-06-19 ENCOUNTER — Other Ambulatory Visit: Payer: Self-pay

## 2015-06-19 ENCOUNTER — Ambulatory Visit (HOSPITAL_COMMUNITY)
Admission: RE | Admit: 2015-06-19 | Discharge: 2015-06-19 | Disposition: A | Discharge status: Home / Self Care | Payer: Medicaid Other | Source: Ambulatory Visit | Attending: Obstetrics and Gynecology | Admitting: Obstetrics and Gynecology

## 2015-06-19 ENCOUNTER — Encounter (HOSPITAL_COMMUNITY): Payer: Self-pay | Admitting: *Deleted

## 2015-06-19 ENCOUNTER — Other Ambulatory Visit: Payer: Self-pay | Admitting: Obstetrics and Gynecology

## 2015-06-19 ENCOUNTER — Inpatient Hospital Stay (HOSPITAL_COMMUNITY)
Admission: AD | Admit: 2015-06-19 | Discharge: 2015-06-19 | Disposition: A | Discharge status: Home / Self Care | Payer: Medicaid Other | Source: Ambulatory Visit | Attending: Obstetrics & Gynecology | Admitting: Obstetrics & Gynecology

## 2015-06-19 ENCOUNTER — Encounter (HOSPITAL_COMMUNITY): Payer: Self-pay

## 2015-06-19 ENCOUNTER — Ambulatory Visit (INDEPENDENT_AMBULATORY_CARE_PROVIDER_SITE_OTHER): Payer: Medicaid Other | Admitting: *Deleted

## 2015-06-19 VITALS — BP 145/91 | HR 81 | Wt 186.0 lb

## 2015-06-19 VITALS — BP 135/76 | HR 86

## 2015-06-19 DIAGNOSIS — O24913 Unspecified diabetes mellitus in pregnancy, third trimester: Secondary | ICD-10-CM | POA: Diagnosis present

## 2015-06-19 DIAGNOSIS — Z3A38 38 weeks gestation of pregnancy: Secondary | ICD-10-CM | POA: Diagnosis not present

## 2015-06-19 DIAGNOSIS — O09523 Supervision of elderly multigravida, third trimester: Secondary | ICD-10-CM

## 2015-06-19 DIAGNOSIS — O24113 Pre-existing diabetes mellitus, type 2, in pregnancy, third trimester: Secondary | ICD-10-CM | POA: Insufficient documentation

## 2015-06-19 DIAGNOSIS — R030 Elevated blood-pressure reading, without diagnosis of hypertension: Secondary | ICD-10-CM | POA: Diagnosis present

## 2015-06-19 DIAGNOSIS — O26893 Other specified pregnancy related conditions, third trimester: Secondary | ICD-10-CM | POA: Diagnosis present

## 2015-06-19 DIAGNOSIS — O0993 Supervision of high risk pregnancy, unspecified, third trimester: Secondary | ICD-10-CM

## 2015-06-19 DIAGNOSIS — IMO0001 Reserved for inherently not codable concepts without codable children: Secondary | ICD-10-CM

## 2015-06-19 DIAGNOSIS — Z6791 Unspecified blood type, Rh negative: Secondary | ICD-10-CM | POA: Diagnosis not present

## 2015-06-19 DIAGNOSIS — O360130 Maternal care for anti-D [Rh] antibodies, third trimester, not applicable or unspecified: Secondary | ICD-10-CM

## 2015-06-19 DIAGNOSIS — Z87891 Personal history of nicotine dependence: Secondary | ICD-10-CM | POA: Diagnosis not present

## 2015-06-19 DIAGNOSIS — O352XX0 Maternal care for (suspected) hereditary disease in fetus, not applicable or unspecified: Secondary | ICD-10-CM

## 2015-06-19 DIAGNOSIS — O34219 Maternal care for unspecified type scar from previous cesarean delivery: Secondary | ICD-10-CM

## 2015-06-19 DIAGNOSIS — O219 Vomiting of pregnancy, unspecified: Secondary | ICD-10-CM

## 2015-06-19 DIAGNOSIS — O133 Gestational [pregnancy-induced] hypertension without significant proteinuria, third trimester: Secondary | ICD-10-CM | POA: Diagnosis not present

## 2015-06-19 LAB — COMPREHENSIVE METABOLIC PANEL WITH GFR
ALT: 15 U/L (ref 14–54)
AST: 18 U/L (ref 15–41)
Albumin: 2.9 g/dL — ABNORMAL LOW (ref 3.5–5.0)
Alkaline Phosphatase: 98 U/L (ref 38–126)
Anion gap: 9 (ref 5–15)
BUN: 9 mg/dL (ref 6–20)
CO2: 20 mmol/L — ABNORMAL LOW (ref 22–32)
Calcium: 8.4 mg/dL — ABNORMAL LOW (ref 8.9–10.3)
Chloride: 106 mmol/L (ref 101–111)
Creatinine, Ser: 0.58 mg/dL (ref 0.44–1.00)
GFR calc Af Amer: >60 mL/min
GFR calc non Af Amer: >60 mL/min
Glucose, Bld: 149 mg/dL — ABNORMAL HIGH (ref 65–99)
Potassium: 3.6 mmol/L (ref 3.5–5.1)
Sodium: 135 mmol/L (ref 135–145)
Total Bilirubin: 0.5 mg/dL (ref 0.3–1.2)
Total Protein: 5.9 g/dL — ABNORMAL LOW (ref 6.5–8.1)

## 2015-06-19 LAB — URINALYSIS, ROUTINE W REFLEX MICROSCOPIC
Bilirubin Urine: NEGATIVE
Glucose, UA: 250 mg/dL — AB
Ketones, ur: >80 mg/dL — AB
Nitrite: NEGATIVE
Protein, ur: NEGATIVE mg/dL
Specific Gravity, Urine: 1.025 (ref 1.005–1.030)
pH: 6 (ref 5.0–8.0)

## 2015-06-19 LAB — CBC WITH DIFFERENTIAL/PLATELET
Basophils Absolute: 0 K/uL (ref 0.0–0.1)
Basophils Relative: 0 %
Eosinophils Absolute: 0 K/uL (ref 0.0–0.7)
Eosinophils Relative: 0 %
HCT: 36.6 % (ref 36.0–46.0)
Hemoglobin: 12.5 g/dL (ref 12.0–15.0)
Lymphocytes Relative: 18 %
Lymphs Abs: 2 K/uL (ref 0.7–4.0)
MCH: 31.9 pg (ref 26.0–34.0)
MCHC: 34.2 g/dL (ref 30.0–36.0)
MCV: 93.4 fL (ref 78.0–100.0)
Monocytes Absolute: 0.4 K/uL (ref 0.1–1.0)
Monocytes Relative: 4 %
Neutro Abs: 9 K/uL — ABNORMAL HIGH (ref 1.7–7.7)
Neutrophils Relative %: 78 %
Platelets: 241 K/uL (ref 150–400)
RBC: 3.92 MIL/uL (ref 3.87–5.11)
RDW: 13.6 % (ref 11.5–15.5)
WBC: 11.5 K/uL — ABNORMAL HIGH (ref 4.0–10.5)

## 2015-06-19 LAB — PROTEIN / CREATININE RATIO, URINE
CREATININE, URINE: 113 mg/dL
PROTEIN CREATININE RATIO: 0.19 mg/mg{creat} — AB (ref 0.00–0.15)
TOTAL PROTEIN, URINE: 22 mg/dL

## 2015-06-19 LAB — URINE MICROSCOPIC-ADD ON

## 2015-06-19 MED ORDER — ONDANSETRON 4 MG PO TBDP: 4.0000 mg | ORAL_TABLET | Freq: Four times a day (QID) | ORAL | Status: DC | PRN

## 2015-06-19 NOTE — Discharge Instructions (Signed)

## 2015-06-19 NOTE — MAU Provider Note (Signed)
History     CSN: [id number]  Arrival date and time: 06/19/15 1742   First Provider Initiated Contact with Patient 06/19/15 1814        Chief Complaint  Patient presents with  . Hypertension   HPI Ms. Cheryl Parker is a 35 y.o. G3P2002 at [redacted]w[redacted]d who presents to MAU today from MFM for further evaluation of elevated blood pressures. She was seen in MFM today had elevated BP for the first time in this pregnancy. She states a history of gestation HTN with last pregnancy. She reports headache rated at 7/10. She has not taken anything for pain. She denies blurred vision, floaters or RUQ pain today. She does endorse mild BLE edema.   OB History    Gravida Para Term Preterm AB TAB SAB Ectopic Multiple Living   0 0 0 0 0 0 2      Past Medical History  Diagnosis Date  . Hypertension   . Pregnancy induced hypertension   . 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 DKA (diabetic ketoacidoses) (HCC) 09/10/2014    Past Surgical History  Procedure Laterality Date  . Cesarean section    . Cesarean section N/A 07/05/2012    Procedure: CESAREAN SECTION;  Surgeon: Catalina Antigua, MD;  Location: [facility];  Service: Obstetrics;  Laterality: N/A;    Family History  Problem Relation Age of Onset  . Diabetes Mother   . Kidney disease Father   . Birth defects Son     unilateral renal agenesis    Social History  Substance Use Topics  . Smoking status: Former Games developer  . Smokeless tobacco: Never Used  . Alcohol Use: No    Allergies: No Known Allergies  No prescriptions prior to admission    Review of Systems  Constitutional: Negative for fever and malaise/fatigue.  Eyes: Negative for blurred vision.       Neg - floaters  Gastrointestinal: Negative for abdominal pain.  Genitourinary:       Neg - vaginal bleeding, LOF  Neurological: Positive for headaches.   Physical Exam   Blood pressure 141/76, pulse 90, temperature 98.3 F (36.8 C), temperature  source Oral, resp. rate 18, last menstrual period 09/25/2014.  Physical Exam  Nursing note and vitals reviewed. Constitutional: She is oriented to person, place, and time. She appears well-developed and well-nourished. No distress.  HENT:  Head: Normocephalic and atraumatic.  Cardiovascular: Normal rate.   Respiratory: Effort normal.  GI: Soft. She exhibits no distension and no mass. There is no tenderness. There is no rebound and no guarding.  Musculoskeletal: She exhibits no edema.  Neurological: She is alert and oriented to person, place, and time. She has normal reflexes.  No clonus  Skin: Skin is warm and dry. No erythema.  Psychiatric: She has a normal mood and affect.     Patient Vitals for the past 24 hrs:  BP Temp Temp src Pulse Resp  06/19/15 1911 - - - - 18  06/19/15 1908 141/76 mmHg - - 90 -  06/19/15 1848 136/83 mmHg - - 93 -  06/19/15 1833 142/78 mmHg - - 91 -  06/19/15 1803 135/82 mmHg - - 97 -  06/19/15 1802 139/79 mmHg 98.3 F (36.8 C) Oral 100 -    MAU Course  Procedures None  MDM UA, CBC, CMP and urine protein/creatinine ratio Serial BPs Discussed with Dr. Erin Fulling. Patient may be discharge home and follow-up as scheduled on Monday.  Assessment and Plan  A: SIUP at [redacted]w[redacted]d Elevated blood pressure in pregnancy, third trimester, mild without significant proteinuria   P: Discharge home Labor/pre-eclmapsia precautions discussed Patient advised to follow-up with [facility] on Monday as scheduled for NST and routine prenatal care Patient may return to MAU as needed or if her condition were to change or worsen   Marny Lowenstein, PA-C  06/19/2015, 7:20 PM

## 2015-06-19 NOTE — Progress Notes (Signed)
[place] for growth today, repeat C/S scheduled 3/1.  Pt reports having nausea and some vomiting since yesterday. She is able to tolerate liquids and some foods. She requested refill of Ondansetron - given.

## 2015-06-19 NOTE — MAU Note (Signed)
Pt sent in from office for elevated b/ps. Pt c/o headache and nausea.

## 2015-06-19 NOTE — Progress Notes (Signed)
NST reviewed and reactive.  Arnulfo Batson L. Harraway-Smith, M.D., FACOG    

## 2015-06-20 ENCOUNTER — Other Ambulatory Visit: Payer: Self-pay

## 2015-06-23 ENCOUNTER — Encounter: Payer: Self-pay | Admitting: Obstetrics and Gynecology

## 2015-06-23 ENCOUNTER — Encounter (HOSPITAL_COMMUNITY)
Admission: RE | Admit: 2015-06-23 | Discharge: 2015-06-23 | Disposition: A | Discharge status: Home / Self Care | Payer: Medicaid Other | Source: Ambulatory Visit | Attending: Obstetrics and Gynecology | Admitting: Obstetrics and Gynecology

## 2015-06-23 ENCOUNTER — Ambulatory Visit (INDEPENDENT_AMBULATORY_CARE_PROVIDER_SITE_OTHER): Payer: Medicaid Other | Admitting: Obstetrics and Gynecology

## 2015-06-23 ENCOUNTER — Encounter (HOSPITAL_COMMUNITY): Payer: Self-pay

## 2015-06-23 VITALS — BP 135/77 | HR 88 | Wt 183.9 lb

## 2015-06-23 DIAGNOSIS — E1165 Type 2 diabetes mellitus with hyperglycemia: Secondary | ICD-10-CM | POA: Diagnosis not present

## 2015-06-23 DIAGNOSIS — O34219 Maternal care for unspecified type scar from previous cesarean delivery: Secondary | ICD-10-CM

## 2015-06-23 DIAGNOSIS — Z01812 Encounter for preprocedural laboratory examination: Secondary | ICD-10-CM | POA: Insufficient documentation

## 2015-06-23 DIAGNOSIS — O360130 Maternal care for anti-D [Rh] antibodies, third trimester, not applicable or unspecified: Secondary | ICD-10-CM | POA: Diagnosis not present

## 2015-06-23 DIAGNOSIS — O24913 Unspecified diabetes mellitus in pregnancy, third trimester: Secondary | ICD-10-CM

## 2015-06-23 DIAGNOSIS — O0993 Supervision of high risk pregnancy, unspecified, third trimester: Secondary | ICD-10-CM

## 2015-06-23 DIAGNOSIS — O09523 Supervision of elderly multigravida, third trimester: Secondary | ICD-10-CM

## 2015-06-23 HISTORY — DX: Headache: R51

## 2015-06-23 HISTORY — DX: Headache, unspecified: R51.9

## 2015-06-23 LAB — POCT URINALYSIS DIP (DEVICE)
Bilirubin Urine: NEGATIVE
Glucose, UA: NEGATIVE mg/dL
HGB URINE DIPSTICK: NEGATIVE
KETONES UR: 40 mg/dL — AB
Nitrite: NEGATIVE
PH: 6 (ref 5.0–8.0)
PROTEIN: 100 mg/dL — AB
Urobilinogen, UA: 0.2 mg/dL (ref 0.0–1.0)

## 2015-06-23 LAB — CBC
HEMATOCRIT: 38.1 % (ref 36.0–46.0)
HEMOGLOBIN: 12.9 g/dL (ref 12.0–15.0)
MCH: 31.7 pg (ref 26.0–34.0)
MCHC: 33.9 g/dL (ref 30.0–36.0)
MCV: 93.6 fL (ref 78.0–100.0)
PLATELETS: 227 10*3/uL (ref 150–400)
RBC: 4.07 MIL/uL (ref 3.87–5.11)
RDW: 13.8 % (ref 11.5–15.5)
WBC: 9.8 10*3/uL (ref 4.0–10.5)

## 2015-06-23 LAB — TYPE AND SCREEN
ABO/RH(D): B NEG
Antibody Screen: NEGATIVE
Weak D: POSITIVE

## 2015-06-23 LAB — BASIC METABOLIC PANEL
ANION GAP: 8 (ref 5–15)
BUN: 10 mg/dL (ref 6–20)
CHLORIDE: 108 mmol/L (ref 101–111)
CO2: 19 mmol/L — AB (ref 22–32)
Calcium: 8.4 mg/dL — ABNORMAL LOW (ref 8.9–10.3)
Creatinine, Ser: 0.56 mg/dL (ref 0.44–1.00)
GFR calc Af Amer: >60 mL/min (ref >60)
GLUCOSE: 143 mg/dL — AB (ref 65–99)
POTASSIUM: 3.6 mmol/L (ref 3.5–5.1)
Sodium: 135 mmol/L (ref 135–145)

## 2015-06-23 NOTE — Progress Notes (Signed)
Subjective:  Cheryl Parker is a 35 y.o. G3P2002 at [redacted]w[redacted]d being seen today for ongoing prenatal care.  She is currently monitored for the following issues for this high-risk pregnancy and has Type 2 diabetes mellitus with hyperglycemia (HCC); HLD (hyperlipidemia); Dehydration with hyponatremia; Hyperemesis affecting pregnancy, antepartum; Pre-existing Diabetes mellitus in pregnancy, antepartum; Previous cesarean section complicating pregnancy, antepartum condition or complication; Marijuana use; Supervision of high-risk pregnancy; Rh negative, antepartum; Advanced maternal age in multigravida; Previous child with congenital anomaly, currently pregnant, antepartum; and Nausea and vomiting during pregnancy on her problem list.  Patient reports no complaints.  Contractions: Irregular. Vag. Bleeding: None.  Movement: Present. Denies leaking of fluid.   The following portions of the patient's history were reviewed and updated as appropriate: allergies, current medications, past family history, past medical history, past social history, past surgical history and problem list. Problem list updated.  Objective:   Filed Vitals:   06/23/15 0922  BP: 135/77  Pulse: 88  Weight: 183 lb 14.4 oz (83.416 kg)    Fetal Status: Fetal Heart Rate (bpm): NST   Movement: Present     General:  Alert, oriented and cooperative. Patient is in no acute distress.  Skin: Skin is warm and dry. No rash noted.   Cardiovascular: Normal heart rate noted  Respiratory: Normal respiratory effort, no problems with respiration noted  Abdomen: Soft, gravid, appropriate for gestational age. Pain/Pressure: Present     Pelvic: Vag. Bleeding: None     Cervical exam deferred        Extremities: Normal range of motion.  Edema: Mild pitting, slight indentation  Mental Status: Normal mood and affect. Normal behavior. Normal judgment and thought content.   Urinalysis: Urine Protein: 2+ Urine Glucose: Negative  Assessment and Plan:   Pregnancy: G3P2002 at [redacted]w[redacted]d  1. Diabetes mellitus in pregnancy, antepartum, third trimester CBGs reviewed and all elevated. Highest fasting 144, highest pp 214 Patient feels that she is administrating the insulin appropriately and is overeating and not always following the diet NST reviewed and reactive  2. Advanced maternal age in multigravida, third trimester   3. Previous cesarean section complicating pregnancy, antepartum condition or complication Repeat c-section 3/1  4. Rh negative, antepartum, third trimester, not applicable or unspecified fetus   5. Supervision of high-risk pregnancy, third trimester   6. Type 2 diabetes mellitus with hyperglycemia, unspecified long term insulin use status (HCC)   Term labor symptoms and general obstetric precautions including but not limited to vaginal bleeding, contractions, leaking of fluid and fetal movement were reviewed in detail with the patient. Please refer to After Visit Summary for other counseling recommendations.  Return in about 6 weeks (around 08/04/2015) for pp visit.   Catalina Antigua, MD

## 2015-06-23 NOTE — Patient Instructions (Addendum)
Your procedure is scheduled on: Wednesday June 25, 2015    Enter through the [employer] of [facility] at: 8:00 am   Pick up the phone at the desk and dial [phone].  Call this number if you have problems the morning of surgery: [phone].  Remember: Do NOT eat food: after midnight on Tuesday  Do NOT drink clear liquids after: midnight on Tuesday  Take these medicines the morning of surgery with a SIP OF WATER: pepcid  At bedtime on Tuesday night take half your normal insulin dose. No Insulin day of surgery.  Do NOT wear jewelry (body piercing), metal hair clips/bobby pins, or nail polish. Do NOT wear lotions, powders, or perfumes.  You may wear deoderant. Do NOT shave for 48 hours prior to surgery. Do NOT bring valuables to the hospital.  Leave suitcase in car.  After surgery it may be brought to your room.  For patients admitted to the hospital, checkout time is 11:00 AM the day of discharge.

## 2015-06-23 NOTE — Progress Notes (Signed)
Pt had MAU visit on 2/23 due to elevated BP @ [place] appt in MFM. Rpt C/S scheduled 3/1.

## 2015-06-24 LAB — RPR: RPR: NONREACTIVE

## 2015-06-25 ENCOUNTER — Encounter (HOSPITAL_COMMUNITY)
Admission: RE | Disposition: A | Discharge status: Home / Self Care | Payer: Self-pay | Source: Ambulatory Visit | Attending: Obstetrics and Gynecology

## 2015-06-25 ENCOUNTER — Inpatient Hospital Stay (HOSPITAL_COMMUNITY): Payer: Medicaid Other | Admitting: Anesthesiology

## 2015-06-25 ENCOUNTER — Encounter (HOSPITAL_COMMUNITY): Payer: Self-pay

## 2015-06-25 ENCOUNTER — Inpatient Hospital Stay (HOSPITAL_COMMUNITY)
Admission: RE | Admit: 2015-06-25 | Discharge: 2015-06-27 | DRG: 765 | Disposition: A | Discharge status: Home / Self Care | Payer: Medicaid Other | Source: Ambulatory Visit | Attending: Obstetrics and Gynecology | Admitting: Obstetrics and Gynecology

## 2015-06-25 DIAGNOSIS — O99824 Streptococcus B carrier state complicating childbirth: Secondary | ICD-10-CM | POA: Diagnosis present

## 2015-06-25 DIAGNOSIS — Z794 Long term (current) use of insulin: Secondary | ICD-10-CM | POA: Diagnosis not present

## 2015-06-25 DIAGNOSIS — O26893 Other specified pregnancy related conditions, third trimester: Secondary | ICD-10-CM | POA: Diagnosis present

## 2015-06-25 DIAGNOSIS — Z98891 History of uterine scar from previous surgery: Secondary | ICD-10-CM

## 2015-06-25 DIAGNOSIS — Z3A39 39 weeks gestation of pregnancy: Secondary | ICD-10-CM

## 2015-06-25 DIAGNOSIS — O34219 Maternal care for unspecified type scar from previous cesarean delivery: Secondary | ICD-10-CM | POA: Diagnosis not present

## 2015-06-25 DIAGNOSIS — Z833 Family history of diabetes mellitus: Secondary | ICD-10-CM | POA: Diagnosis not present

## 2015-06-25 DIAGNOSIS — O34211 Maternal care for low transverse scar from previous cesarean delivery: Secondary | ICD-10-CM | POA: Diagnosis present

## 2015-06-25 DIAGNOSIS — Z6791 Unspecified blood type, Rh negative: Secondary | ICD-10-CM

## 2015-06-25 DIAGNOSIS — Z87891 Personal history of nicotine dependence: Secondary | ICD-10-CM | POA: Diagnosis not present

## 2015-06-25 DIAGNOSIS — O2412 Pre-existing diabetes mellitus, type 2, in childbirth: Secondary | ICD-10-CM | POA: Diagnosis present

## 2015-06-25 DIAGNOSIS — E119 Type 2 diabetes mellitus without complications: Secondary | ICD-10-CM | POA: Diagnosis present

## 2015-06-25 DIAGNOSIS — O09523 Supervision of elderly multigravida, third trimester: Secondary | ICD-10-CM

## 2015-06-25 LAB — GLUCOSE, CAPILLARY
Glucose-Capillary: 151 mg/dL — ABNORMAL HIGH (ref 65–99)
Glucose-Capillary: 105 mg/dL — ABNORMAL HIGH (ref 65–99)

## 2015-06-25 SURGERY — Surgical Case
Anesthesia: Spinal | Site: Abdomen

## 2015-06-25 MED ORDER — LINAGLIPTIN 5 MG PO TABS
5.0000 mg | ORAL_TABLET | Freq: Every day | ORAL | Status: DC
  Administered 2015-06-27: 5 mg via ORAL
  Filled 2015-06-25 (×3): qty 1

## 2015-06-25 MED ORDER — DIBUCAINE 1 % RE OINT
1.0000 | TOPICAL_OINTMENT | RECTAL | Status: DC | PRN
Start: 2015-06-25 — End: 2015-06-27

## 2015-06-25 MED ORDER — DIPHENHYDRAMINE HCL 25 MG PO CAPS
25.0000 mg | ORAL_CAPSULE | ORAL | Status: DC | PRN
  Administered 2015-06-25 (×2): 25 mg via ORAL
  Filled 2015-06-25 (×2): qty 1

## 2015-06-25 MED ORDER — SENNOSIDES-DOCUSATE SODIUM 8.6-50 MG PO TABS
2.0000 | ORAL_TABLET | ORAL | Status: DC
  Administered 2015-06-26 (×2): 2 via ORAL
  Filled 2015-06-25 (×2): qty 2

## 2015-06-25 MED ORDER — SCOPOLAMINE 1 MG/3DAYS TD PT72: 1.0000 | MEDICATED_PATCH | Freq: Once | TRANSDERMAL | Status: DC

## 2015-06-25 MED ORDER — NALBUPHINE HCL 10 MG/ML IJ SOLN
5.0000 mg | Freq: Once | INTRAMUSCULAR | Status: AC | PRN
  Administered 2015-06-25: 5 mg via INTRAVENOUS

## 2015-06-25 MED ORDER — SCOPOLAMINE 1 MG/3DAYS TD PT72
1.0000 | MEDICATED_PATCH | Freq: Once | TRANSDERMAL | Status: DC
  Administered 2015-06-25: 1.5 mg via TRANSDERMAL

## 2015-06-25 MED ORDER — OXYCODONE-ACETAMINOPHEN 5-325 MG PO TABS
2.0000 | ORAL_TABLET | ORAL | Status: DC | PRN
  Filled 2015-06-25: qty 2

## 2015-06-25 MED ORDER — PHENYLEPHRINE 8 MG IN D5W 100 ML (0.08MG/ML) PREMIX OPTIME
INJECTION | INTRAVENOUS | Status: DC | PRN
  Administered 2015-06-25: 60 ug/min via INTRAVENOUS

## 2015-06-25 MED ORDER — LACTATED RINGERS IV SOLN
INTRAVENOUS | Status: DC
  Administered 2015-06-25: 10:00:00 via INTRAVENOUS
  Administered 2015-06-25: 125 mL/h via INTRAVENOUS

## 2015-06-25 MED ORDER — WITCH HAZEL-GLYCERIN EX PADS: 1.0000 "application " | MEDICATED_PAD | CUTANEOUS | Status: DC | PRN

## 2015-06-25 MED ORDER — NALBUPHINE HCL 10 MG/ML IJ SOLN
INTRAMUSCULAR | Status: AC
  Administered 2015-06-25: 5 mg via INTRAVENOUS
  Filled 2015-06-25: qty 1

## 2015-06-25 MED ORDER — NALBUPHINE HCL 10 MG/ML IJ SOLN
5.0000 mg | INTRAMUSCULAR | Status: DC | PRN
  Administered 2015-06-25 – 2015-06-26 (×6): 5 mg via INTRAVENOUS
  Filled 2015-06-25 (×5): qty 1

## 2015-06-25 MED ORDER — METFORMIN HCL 500 MG PO TABS
1000.0000 mg | ORAL_TABLET | Freq: Two times a day (BID) | ORAL | Status: DC
  Filled 2015-06-25 (×2): qty 2

## 2015-06-25 MED ORDER — NALBUPHINE HCL 10 MG/ML IJ SOLN: 5.0000 mg | Freq: Once | INTRAMUSCULAR | Status: AC | PRN

## 2015-06-25 MED ORDER — KETOROLAC TROMETHAMINE 30 MG/ML IJ SOLN
INTRAMUSCULAR | Status: AC
  Filled 2015-06-25: qty 1

## 2015-06-25 MED ORDER — MORPHINE SULFATE (PF) 0.5 MG/ML IJ SOLN
INTRAMUSCULAR | Status: DC | PRN
  Administered 2015-06-25: .2 mg via INTRATHECAL

## 2015-06-25 MED ORDER — FENTANYL CITRATE (PF) 100 MCG/2ML IJ SOLN
INTRAMUSCULAR | Status: DC | PRN
  Administered 2015-06-25: 20 ug via INTRATHECAL

## 2015-06-25 MED ORDER — PRENATAL MULTIVITAMIN CH
1.0000 | ORAL_TABLET | Freq: Every day | ORAL | Status: DC
  Administered 2015-06-26: 1 via ORAL
  Filled 2015-06-25: qty 1

## 2015-06-25 MED ORDER — ACETAMINOPHEN 325 MG PO TABS: 650.0000 mg | ORAL_TABLET | ORAL | Status: DC | PRN

## 2015-06-25 MED ORDER — OXYCODONE-ACETAMINOPHEN 5-325 MG PO TABS
1.0000 | ORAL_TABLET | ORAL | Status: DC | PRN
  Administered 2015-06-25 – 2015-06-27 (×7): 1 via ORAL
  Filled 2015-06-25 (×6): qty 1

## 2015-06-25 MED ORDER — ONDANSETRON HCL 4 MG/2ML IJ SOLN
INTRAMUSCULAR | Status: DC | PRN
  Administered 2015-06-25: 4 mg via INTRAVENOUS

## 2015-06-25 MED ORDER — NALBUPHINE HCL 10 MG/ML IJ SOLN: 5.0000 mg | INTRAMUSCULAR | Status: DC | PRN

## 2015-06-25 MED ORDER — LACTATED RINGERS IV SOLN
INTRAVENOUS | Status: DC
  Administered 2015-06-25 – 2015-06-26 (×2): via INTRAVENOUS

## 2015-06-25 MED ORDER — TETANUS-DIPHTH-ACELL PERTUSSIS 5-2.5-18.5 LF-MCG/0.5 IM SUSP
0.5000 mL | Freq: Once | INTRAMUSCULAR | Status: DC
Start: 2015-06-26 — End: 2015-06-27

## 2015-06-25 MED ORDER — BUPIVACAINE IN DEXTROSE 0.75-8.25 % IT SOLN
INTRATHECAL | Status: DC | PRN
  Administered 2015-06-25: 1.4 mL via INTRATHECAL

## 2015-06-25 MED ORDER — SIMETHICONE 80 MG PO CHEW
80.0000 mg | CHEWABLE_TABLET | ORAL | Status: DC
Start: 2015-06-26 — End: 2015-06-27
  Administered 2015-06-26 (×2): 80 mg via ORAL
  Filled 2015-06-25 (×2): qty 1

## 2015-06-25 MED ORDER — SODIUM CHLORIDE 0.9 % IR SOLN
Status: DC | PRN
  Administered 2015-06-25: 1000 mL

## 2015-06-25 MED ORDER — IBUPROFEN 600 MG PO TABS
600.0000 mg | ORAL_TABLET | Freq: Four times a day (QID) | ORAL | Status: DC
  Administered 2015-06-26 – 2015-06-27 (×5): 600 mg via ORAL
  Filled 2015-06-25 (×6): qty 1

## 2015-06-25 MED ORDER — PNEUMOCOCCAL VAC POLYVALENT 25 MCG/0.5ML IJ INJ
0.5000 mL | INJECTION | INTRAMUSCULAR | Status: DC
  Filled 2015-06-25: qty 0.5

## 2015-06-25 MED ORDER — DIPHENHYDRAMINE HCL 50 MG/ML IJ SOLN: 12.5000 mg | INTRAMUSCULAR | Status: DC | PRN

## 2015-06-25 MED ORDER — LACTATED RINGERS IV SOLN
INTRAVENOUS | Status: DC | PRN
  Administered 2015-06-25: 10:00:00 via INTRAVENOUS

## 2015-06-25 MED ORDER — KETOROLAC TROMETHAMINE 30 MG/ML IJ SOLN
30.0000 mg | Freq: Four times a day (QID) | INTRAMUSCULAR | Status: AC | PRN
  Administered 2015-06-25: 30 mg via INTRAVENOUS
  Filled 2015-06-25: qty 1

## 2015-06-25 MED ORDER — ONDANSETRON HCL 4 MG/2ML IJ SOLN: 4.0000 mg | Freq: Three times a day (TID) | INTRAMUSCULAR | Status: DC | PRN

## 2015-06-25 MED ORDER — MENTHOL 3 MG MT LOZG: 1.0000 | LOZENGE | OROMUCOSAL | Status: DC | PRN

## 2015-06-25 MED ORDER — NALOXONE HCL 2 MG/2ML IJ SOSY
1.0000 ug/kg/h | PREFILLED_SYRINGE | INTRAMUSCULAR | Status: DC | PRN
  Filled 2015-06-25: qty 2

## 2015-06-25 MED ORDER — ONDANSETRON HCL 4 MG/2ML IJ SOLN
INTRAMUSCULAR | Status: AC
  Filled 2015-06-25: qty 2

## 2015-06-25 MED ORDER — CEFAZOLIN SODIUM-DEXTROSE 2-3 GM-% IV SOLR
INTRAVENOUS | Status: AC
  Filled 2015-06-25: qty 50

## 2015-06-25 MED ORDER — SCOPOLAMINE 1 MG/3DAYS TD PT72
MEDICATED_PATCH | TRANSDERMAL | Status: AC
  Administered 2015-06-25: 1.5 mg via TRANSDERMAL
  Filled 2015-06-25: qty 1

## 2015-06-25 MED ORDER — FENTANYL CITRATE (PF) 100 MCG/2ML IJ SOLN
INTRAMUSCULAR | Status: AC
  Filled 2015-06-25: qty 2

## 2015-06-25 MED ORDER — PHENYLEPHRINE 8 MG IN D5W 100 ML (0.08MG/ML) PREMIX OPTIME
INJECTION | INTRAVENOUS | Status: AC
  Filled 2015-06-25: qty 100

## 2015-06-25 MED ORDER — MEPERIDINE HCL 25 MG/ML IJ SOLN: 6.2500 mg | INTRAMUSCULAR | Status: DC | PRN

## 2015-06-25 MED ORDER — NALOXONE HCL 0.4 MG/ML IJ SOLN: 0.4000 mg | INTRAMUSCULAR | Status: DC | PRN

## 2015-06-25 MED ORDER — IBUPROFEN 600 MG PO TABS
600.0000 mg | ORAL_TABLET | Freq: Four times a day (QID) | ORAL | Status: DC | PRN
  Administered 2015-06-26: 600 mg via ORAL

## 2015-06-25 MED ORDER — SIMETHICONE 80 MG PO CHEW
80.0000 mg | CHEWABLE_TABLET | Freq: Three times a day (TID) | ORAL | Status: DC
  Administered 2015-06-25 – 2015-06-27 (×6): 80 mg via ORAL
  Filled 2015-06-25 (×6): qty 1

## 2015-06-25 MED ORDER — FENTANYL CITRATE (PF) 100 MCG/2ML IJ SOLN: 25.0000 ug | INTRAMUSCULAR | Status: DC | PRN

## 2015-06-25 MED ORDER — MORPHINE SULFATE (PF) 0.5 MG/ML IJ SOLN
INTRAMUSCULAR | Status: AC
  Filled 2015-06-25: qty 10

## 2015-06-25 MED ORDER — CEFAZOLIN SODIUM-DEXTROSE 2-3 GM-% IV SOLR
2.0000 g | INTRAVENOUS | Status: AC
  Administered 2015-06-25: 2 g via INTRAVENOUS

## 2015-06-25 MED ORDER — OXYTOCIN 10 UNIT/ML IJ SOLN
INTRAMUSCULAR | Status: AC
  Filled 2015-06-25: qty 4

## 2015-06-25 MED ORDER — LACTATED RINGERS IV SOLN
Freq: Once | INTRAVENOUS | Status: AC
  Administered 2015-06-25: 1000 mL/h via INTRAVENOUS

## 2015-06-25 MED ORDER — OXYTOCIN 10 UNIT/ML IJ SOLN: 2.5000 [IU]/h | INTRAVENOUS | Status: AC

## 2015-06-25 MED ORDER — SODIUM CHLORIDE 0.9% FLUSH: 3.0000 mL | INTRAVENOUS | Status: DC | PRN

## 2015-06-25 MED ORDER — SIMETHICONE 80 MG PO CHEW: 80.0000 mg | CHEWABLE_TABLET | ORAL | Status: DC | PRN

## 2015-06-25 MED ORDER — DIPHENHYDRAMINE HCL 25 MG PO CAPS
25.0000 mg | ORAL_CAPSULE | Freq: Four times a day (QID) | ORAL | Status: DC | PRN
  Filled 2015-06-25: qty 1

## 2015-06-25 MED ORDER — LANOLIN HYDROUS EX OINT: 1.0000 "application " | TOPICAL_OINTMENT | CUTANEOUS | Status: DC | PRN

## 2015-06-25 MED ORDER — OXYTOCIN 10 UNIT/ML IJ SOLN
40.0000 [IU] | INTRAVENOUS | Status: DC | PRN
  Administered 2015-06-25: 40 [IU] via INTRAVENOUS

## 2015-06-25 MED ORDER — KETOROLAC TROMETHAMINE 30 MG/ML IJ SOLN
30.0000 mg | Freq: Four times a day (QID) | INTRAMUSCULAR | Status: AC | PRN
  Administered 2015-06-25: 30 mg via INTRAMUSCULAR

## 2015-06-25 SURGICAL SUPPLY — 33 items
BRR ADH 6X5 SEPRAFILM 1 SHT (MISCELLANEOUS)
CLAMP CORD UMBIL (MISCELLANEOUS) IMPLANT
CLOSURE WOUND 1/2 X4 (GAUZE/BANDAGES/DRESSINGS) ×1
CONTAINER PREFILL 10% NBF 15ML (MISCELLANEOUS) IMPLANT
DRSG OPSITE POSTOP 4X10 (GAUZE/BANDAGES/DRESSINGS) ×3 IMPLANT
DURAPREP 26ML APPLICATOR (WOUND CARE) ×3 IMPLANT
ELECT REM PT RETURN 9FT ADLT (ELECTROSURGICAL) ×3
ELECTRODE REM PT RTRN 9FT ADLT (ELECTROSURGICAL) ×1 IMPLANT
EXTRACTOR VACUUM M CUP 4 TUBE (SUCTIONS) IMPLANT
EXTRACTOR VACUUM M CUP 4' TUBE (SUCTIONS)
GLOVE BIOGEL PI IND STRL 6.5 (GLOVE) ×1 IMPLANT
GLOVE BIOGEL PI IND STRL 7.0 (GLOVE) ×1 IMPLANT
GLOVE BIOGEL PI INDICATOR 6.5 (GLOVE) ×2
GLOVE BIOGEL PI INDICATOR 7.0 (GLOVE) ×8
GLOVE SURG SS PI 6.0 STRL IVOR (GLOVE) ×3 IMPLANT
GOWN STRL REUS W/TWL LRG LVL3 (GOWN DISPOSABLE) ×8 IMPLANT
KIT ABG SYR 3ML LUER SLIP (SYRINGE) IMPLANT
NDL HYPO 25X5/8 SAFETYGLIDE (NEEDLE) IMPLANT
NEEDLE HYPO 25X5/8 SAFETYGLIDE (NEEDLE) IMPLANT
NS IRRIG 1000ML POUR BTL (IV SOLUTION) ×3 IMPLANT
PACK C SECTION WH (CUSTOM PROCEDURE TRAY) ×3 IMPLANT
PAD ABD 8X10 STRL (GAUZE/BANDAGES/DRESSINGS) ×2 IMPLANT
PAD OB MATERNITY 4.3X12.25 (PERSONAL CARE ITEMS) ×3 IMPLANT
PENCIL SMOKE EVAC W/HOLSTER (ELECTROSURGICAL) ×1 IMPLANT
RTRCTR C-SECT PINK 25CM LRG (MISCELLANEOUS) ×3 IMPLANT
SEPRAFILM MEMBRANE 5X6 (MISCELLANEOUS) IMPLANT
SPONGE GAUZE 4X4 STER-INACTIVE (GAUZE/BANDAGES/DRESSINGS) ×2 IMPLANT
STRIP CLOSURE SKIN 1/2X4 (GAUZE/BANDAGES/DRESSINGS) ×1 IMPLANT
SUT PLAIN 0 NONE (SUTURE) IMPLANT
SUT VIC AB 0 CT1 36 (SUTURE) ×12 IMPLANT
SUT VIC AB 4-0 KS 27 (SUTURE) ×3 IMPLANT
TOWEL OR 17X24 6PK STRL BLUE (TOWEL DISPOSABLE) ×3 IMPLANT
TRAY FOLEY CATH SILVER 14FR (SET/KITS/TRAYS/PACK) ×3 IMPLANT

## 2015-06-25 NOTE — H&P (Signed)
Cheryl Parker is a 35 y.o. female G3P2 at 68 weeks presenting for scheduled repeat cesarean section. Patient with prenatal care at [facility] clinic since 12weeks complicated by pre-existing type 2 diabetes, AMA and previous c-section x2 History OB History    Gravida Para Term Preterm AB TAB SAB Ectopic Multiple Living   0 0 0 0 0 0 2     Past Medical History  Diagnosis Date  . Hypertension   . Pregnancy induced hypertension   . 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 DKA (diabetic ketoacidoses) (HCC) 09/10/2014  . Headache    Past Surgical History  Procedure Laterality Date  . Cesarean section    . Cesarean section N/A 07/05/2012    Procedure: CESAREAN SECTION;  Surgeon: Catalina Antigua, MD;  Location: [facility];  Service: Obstetrics;  Laterality: N/A;   Family History: family history includes Birth defects in her son; Diabetes in her mother; Kidney disease in her father. Social History:  reports that she has quit smoking. She has never used smokeless tobacco. She reports that she does not drink alcohol or use illicit drugs.   Prenatal Transfer Tool  Maternal Diabetes: Yes:  Diabetes Type:  Pre-pregnancy Genetic Screening: Normal Maternal Ultrasounds/Referrals: Normal Fetal Ultrasounds or other Referrals:  Fetal echo normal 03/2015 Maternal Substance Abuse:  No Significant Maternal Medications:  Meds include: Other: insulin Significant Maternal Lab Results:  None Other Comments:  None  ROS See pertinent in HPI   Blood pressure 150/89, pulse 83, temperature 98.3 F (36.8 C), temperature source Oral, resp. rate 18, last menstrual period 09/25/2014, SpO2 99 %. Exam Physical Exam  GENERAL: Well-developed, well-nourished female in no acute distress.  HEENT: Normocephalic, atraumatic. Sclerae anicteric.  NECK: Supple. Normal thyroid.  LUNGS: Clear to auscultation bilaterally.  HEART: Regular rate and rhythm. ABDOMEN: Soft, gravid,  nontender. PELVIC: Not indicated. EXTREMITIES: No cyanosis, clubbing, or edema, 2+ distal pulses.  Prenatal labs: ABO, Rh: --/--/B NEG (02/27 1330) Antibody: NEG (02/27 1330) Rubella: 1.02 (08/29 1030) RPR: Non Reactive (02/27 1330)  HBsAg: NEGATIVE (08/29 1030)  HIV: NONREACTIVE (12/12 1451)  GBS:   Positive  Assessment/Plan: 35 yo G3P2 at 39 weeks here for scheduled repeat cesarean section - Risks, benefits and alternatives were reviewed with the patient including but not limited to risks of bleeding, infection and damage to adjacent organs. Patient verbalized understanding and all questions were answered - She plans on using depo-provera for contraception   Cheryl Parker 06/25/2015, 9:25 AM

## 2015-06-25 NOTE — Transfer of Care (Signed)
Immediate Anesthesia Transfer of Care Note  Patient: Cheryl Parker  Procedure(s) Performed: Procedure(s): CESAREAN SECTION (N/A)  Patient Location: PACU  Anesthesia Type:Spinal  Level of Consciousness: awake, alert  and oriented  Airway & Oxygen Therapy: Patient Spontanous Breathing  Post-op Assessment: Report given to RN and Post -op Vital signs reviewed and stable  Post vital signs: Reviewed and stable  Last Vitals:  Filed Vitals:   06/25/15 0818  BP: 150/89  Pulse: 83  Temp: 36.8 C  Resp: 18    Complications: No apparent anesthesia complications

## 2015-06-25 NOTE — Op Note (Signed)
Cheryl Parker PROCEDURE DATE: 06/25/2015  PREOPERATIVE DIAGNOSIS: Intrauterine pregnancy at  [redacted]w[redacted]d weeks gestation; previous uterine incision kerr x2  POSTOPERATIVE DIAGNOSIS: The same  PROCEDURE:     Cesarean Section  SURGEON:  Dr. Catalina Antigua  ASSISTANT: Dr. Cala Bradford newton  INDICATIONS: Cheryl Parker is a 35 y.o. [id number] at [redacted]w[redacted]d scheduled for cesarean section secondary to previous uterine incision kerr x2.  The risks of cesarean section discussed with the patient included but were not limited to: bleeding which may require transfusion or reoperation; infection which may require antibiotics; injury to bowel, bladder, ureters or other surrounding organs; injury to the fetus; need for additional procedures including hysterectomy in the event of a life-threatening hemorrhage; placental abnormalities wth subsequent pregnancies, incisional problems, thromboembolic phenomenon and other postoperative/anesthesia complications. The patient concurred with the proposed plan, giving informed written consent for the procedure.    FINDINGS:  Viable female infant in cephalic presentation.  Apgars 8 and 9.  Clear amniotic fluid.  Intact placenta, three vessel cord.  Normal uterus, fallopian tubes and ovaries bilaterally.  ANESTHESIA:    Spinal INTRAVENOUS FLUIDS:2500 ml ESTIMATED BLOOD LOSS: 800 ml URINE OUTPUT:  100 ml SPECIMENS: Placenta sent to L&D COMPLICATIONS: None immediate  PROCEDURE IN DETAIL:  The patient received intravenous antibiotics and had sequential compression devices applied to her lower extremities while in the preoperative area.  She was then taken to the operating room where anesthesia was induced and was found to be adequate. A foley catheter was placed into her bladder and attached to Duwane Gewirtz gravity. She was then placed in a dorsal supine position with a leftward tilt, and prepped and draped in a sterile manner. After an adequate timeout was performed, a Pfannenstiel skin  incision was made with scalpel and carried through to the underlying layer of fascia. The fascia was incised in the midline and this incision was extended bilaterally using the Mayo scissors. Kocher clamps were applied to the superior aspect of the fascial incision and the underlying rectus muscles were dissected off bluntly. A similar process was carried out on the inferior aspect of the facial incision. The rectus muscles were separated in the midline bluntly and the peritoneum was entered bluntly. The Alexis self-retaining retractor was introduced into the abdominal cavity. Attention was turned to the lower uterine segment where a transverse hysterotomy was made with a scalpel and extended bilaterally bluntly. The infant was successfully delivered, and cord was clamped and cut and infant was handed over to awaiting neonatology team. Uterine massage was then administered and the placenta delivered intact with three-vessel cord. The uterus was cleared of clot and debris.  The hysterotomy was closed with 0 Vicryl in a running locked fashion, and an imbricating layer was also placed with a 0 Vicryl. Overall, excellent hemostasis was noted. The pelvis copiously irrigated and cleared of all clot and debris. Hemostasis was confirmed on all surfaces.  The peritoneum and the muscles were reapproximated using 0 vicryl interrupted stitches. The fascia was then closed using 0 Vicryl in a running fashion.  The skin was closed in a subcuticular fashion using 3.0 Vicryl. The patient tolerated the procedure well. Sponge, lap, instrument and needle counts were correct x 2. She was taken to the recovery room in stable condition.    Gerold Sar,PEGGYMD  06/25/2015 10:20 AM

## 2015-06-25 NOTE — Anesthesia Postprocedure Evaluation (Signed)
Anesthesia Post Note  Patient: Cheryl Parker  Procedure(s) Performed: Procedure(s) (LRB): CESAREAN SECTION (N/A)  Patient location during evaluation: Mother Baby Anesthesia Type: Spinal Level of consciousness: awake and alert and oriented Pain management: satisfactory to patient Vital Signs Assessment: post-procedure vital signs reviewed and stable Respiratory status: respiratory function stable and spontaneous breathing Cardiovascular status: blood pressure returned to baseline Postop Assessment: no headache, no backache, spinal receding, patient able to bend at knees and adequate PO intake Anesthetic complications: no    Last Vitals:  Filed Vitals:   06/25/15 1350 06/25/15 1450  BP: 144/84 140/77  Pulse: 73 72  Temp: 36.7 C 36.7 C  Resp: 18 19    Last Pain:  Filed Vitals:   06/25/15 1603  PainSc: 0-No pain                 Fleeta Kunde

## 2015-06-25 NOTE — Anesthesia Procedure Notes (Signed)
Spinal Patient location during procedure: OR Start time: 06/25/2015 9:24 AM Staffing Anesthesiologist: Mal Amabile Performed by: anesthesiologist  Preanesthetic Checklist Completed: patient identified, site marked, surgical consent, pre-op evaluation, timeout performed, IV checked, risks and benefits discussed and monitors and equipment checked Spinal Block Patient position: sitting Prep: site prepped and draped and DuraPrep Patient monitoring: heart rate, cardiac monitor, continuous pulse ox and blood pressure Approach: midline Location: L4-5 Injection technique: single-shot Needle Needle type: Sprotte  Needle gauge: 24 G Needle length: 9 cm Needle insertion depth: 5 cm Assessment Sensory level: T4 Additional Notes Patient tolerated procedure well. Adequate sensory level.

## 2015-06-25 NOTE — Addendum Note (Signed)
Addendum  created 06/25/15 1608 by Graciela Husbands, CRNA   Modules edited: Notes Section   Notes Section:  File: [id number]

## 2015-06-25 NOTE — Anesthesia Postprocedure Evaluation (Signed)
Anesthesia Post Note  Patient: Cheryl Parker  Procedure(s) Performed: Procedure(s) (LRB): CESAREAN SECTION (N/A)  Patient location during evaluation: PACU Anesthesia Type: Spinal Level of consciousness: awake and alert and oriented Pain management: pain level controlled Vital Signs Assessment: post-procedure vital signs reviewed and stable Respiratory status: nonlabored ventilation, spontaneous breathing and respiratory function stable Cardiovascular status: stable and blood pressure returned to baseline Postop Assessment: no headache, no backache, spinal receding, patient able to bend at knees and no signs of nausea or vomiting Anesthetic complications: no    Last Vitals:  Filed Vitals:   06/25/15 1133 06/25/15 1135  BP:    Pulse: 72 72  Temp:    Resp: 18 18    Last Pain:  Filed Vitals:   06/25/15 1135  PainSc: 0-No pain                 Hairo Garraway A.

## 2015-06-25 NOTE — Progress Notes (Signed)
Nurse called Cheryl Parker CNM to notify about the patient not having any blood sugar checks ordered. CNM gave a verbal order for a fasting blood sugar daily and a blood sugar two hours after dinner. Nurse also notified CNM about elevated blood pressure and patient refusing diabetes medicine including Metformin and Trigenta. CNM said to continue monitoring blood pressures and to document patient refusal of Diabetes medication.

## 2015-06-25 NOTE — Anesthesia Preprocedure Evaluation (Addendum)
Anesthesia Evaluation  Patient identified by MRN, date of birth, ID band Patient awake    Reviewed: Allergy & Precautions, H&P , NPO status , Patient's Chart, lab work & pertinent test results  History of Anesthesia Complications (+) history of anesthetic complications  Airway Mallampati: III  TM Distance: >3 FB Neck ROM: Full    Dental no notable dental hx. (+) Teeth Intact   Pulmonary neg pulmonary ROS, former smoker,    Pulmonary exam normal breath sounds clear to auscultation       Cardiovascular hypertension, negative cardio ROS Normal cardiovascular exam Rhythm:Regular Rate:Normal     Neuro/Psych  Headaches, negative psych ROS   GI/Hepatic Medicated and Controlled,(+)     substance abuse  marijuana use,   Endo/Other  diabetes, Poorly Controlled, Gestational, Insulin DependentObesity  Renal/GU negative Renal ROS     Musculoskeletal   Abdominal   Peds  Hematology negative hematology ROS (+)   Anesthesia Other Findings   Reproductive/Obstetrics (+) Pregnancy                            Anesthesia Physical Anesthesia Plan  ASA: II  Anesthesia Plan: Spinal   Post-op Pain Management:    Induction:   Airway Management Planned:   Additional Equipment:   Intra-op Plan:   Post-operative Plan:   Informed Consent: I have reviewed the patients History and Physical, chart, labs and discussed the procedure including the risks, benefits and alternatives for the proposed anesthesia with the patient or authorized representative who has indicated his/her understanding and acceptance.     Plan Discussed with: Anesthesiologist  Anesthesia Plan Comments:         Anesthesia Quick Evaluation

## 2015-06-25 NOTE — Progress Notes (Signed)
Called Philipp Deputy CNM regarding pt's elevated BP 149/87, asymptomatic. New parameters for calling provider received. Also addressed pt's c/o pain 5/10. Already had toradol around 1800 and cannot have percocet until 2125. Order for tylenol received.

## 2015-06-26 ENCOUNTER — Encounter (HOSPITAL_COMMUNITY): Payer: Self-pay | Admitting: Obstetrics and Gynecology

## 2015-06-26 LAB — CBC
HCT: 38.9 % (ref 36.0–46.0)
Hemoglobin: 12.9 g/dL (ref 12.0–15.0)
MCH: 31.2 pg (ref 26.0–34.0)
MCHC: 33.2 g/dL (ref 30.0–36.0)
MCV: 94 fL (ref 78.0–100.0)
PLATELETS: 235 10*3/uL (ref 150–400)
RBC: 4.14 MIL/uL (ref 3.87–5.11)
RDW: 13.7 % (ref 11.5–15.5)
WBC: 9.6 10*3/uL (ref 4.0–10.5)

## 2015-06-26 LAB — BIRTH TISSUE RECOVERY COLLECTION (PLACENTA DONATION)

## 2015-06-26 LAB — GLUCOSE, CAPILLARY
GLUCOSE-CAPILLARY: 158 mg/dL — AB (ref 65–99)
GLUCOSE-CAPILLARY: 189 mg/dL — AB (ref 65–99)
Glucose-Capillary: 134 mg/dL — ABNORMAL HIGH (ref 65–99)
Glucose-Capillary: 192 mg/dL — ABNORMAL HIGH (ref 65–99)

## 2015-06-26 LAB — KLEIHAUER-BETKE STAIN
# Vials RhIg: 1
FETAL CELLS %: 0 %
QUANTITATION FETAL HEMOGLOBIN: 0 mL

## 2015-06-26 MED ORDER — RHO D IMMUNE GLOBULIN 1500 UNIT/2ML IJ SOSY
300.0000 ug | PREFILLED_SYRINGE | Freq: Once | INTRAMUSCULAR | Status: AC
  Administered 2015-06-26: 300 ug via INTRAVENOUS
  Filled 2015-06-26: qty 2

## 2015-06-26 MED ORDER — METFORMIN HCL ER 500 MG PO TB24
500.0000 mg | ORAL_TABLET | Freq: Every day | ORAL | Status: DC
  Administered 2015-06-27: 500 mg via ORAL
  Filled 2015-06-26: qty 1

## 2015-06-26 NOTE — Progress Notes (Signed)
Patient ordered a fasting blood sugar this AM. Patient was informed to call for blood sugar to be checked before she ate. Patient did not call before eating so blood sugar was checked right after eating and it was 189. Patient also refused her AM medication of metformin and Trajenta. Dr. Alvester Morin notified.

## 2015-06-26 NOTE — Progress Notes (Signed)
Subjective: Postpartum Day #1: Cesarean Delivery Patient reports incisional pain, tolerating PO, + flatus and no problems voiding. Up ad lib, complaining of generalized pruritis. The patient reports that she was unaware that she had diabetes outside of pregnancy. She reports "always going back to normal after her pregnancies. She reports she does not have a PCP.She has multiple family members with diabetes. She also reports she will not take metformin due to GI side effects, notably severe diarrhea.   Objective: Vital signs in last 24 hours: Temp:  [97.6 F (36.4 C)-98.6 F (37 C)] 97.6 F (36.4 C) (03/02 0913) Pulse Rate:  [72-85] 81 (03/02 1008) Resp:  [18-19] 18 (03/02 0913) BP: (138-152)/(77-89) 146/84 mmHg (03/02 1008) SpO2:  [99 %-100 %] 100 % (03/02 0913)  Physical Exam:  General: alert, cooperative, appears stated age, no distress and mildly obese Lochia: deferred Uterine Fundus: firm Incision: no significant drainage or dehiscence visible. Reinforced dressing, unable to visualize for erythema.  DVT Evaluation: No evidence of DVT seen on physical exam. Negative Homan's sign. No cords or calf tenderness. No significant calf/ankle edema.   Recent Labs  06/23/15 1330 06/26/15 0516  HGB 12.9 12.9  HCT 38.1 38.9    Assessment/Plan: Status post Cesarean section. Doing well postoperatively.  Continue current care.  #T2DM (Class B GDM): I reviewed the patient's charts and in 2011 her HA1C met criteria for the diagnosis of diabetes with HA1C of 6.5. Additionally 09/09/2014 (prior this pregnancy) she was admitted to the [facility] for DKA/HHS at which time she has a HA1C of 9.9%  - reviewed information in detail and informed patient that she does have type 2 diabetes and she needs to be on medications between pregnancies and needs to have PCP follow up regularly for diabetes - will switch to metformin XR to help with GI SE - Continue GLP agonist  #Discussed  contraception at length and patient insists on condoms. Discussed risk of repeated surgery.   Juanita Craver [facility] 06/26/2015, 1:24 PM

## 2015-06-26 NOTE — Progress Notes (Addendum)
Inpatient Diabetes Program Recommendations  AACE/ADA: New Consensus Statement on Inpatient Glycemic Control (2015)  Target Ranges:  Prepandial:   less than 140 mg/dL      Peak postprandial:   less than 180 mg/dL (1-2 hours)      Critically ill patients:  140 - 180 mg/dL  Results for Cheryl Parker, Cheryl Parker (MRN [id number]) as of 06/26/2015 09:55  Ref. Range 06/25/2015 08:00 06/25/2015 11:19 06/26/2015 00:07 06/26/2015 08:13  Glucose-Capillary Latest Ref Range: 65-99 mg/dL 409 (H) 811 (H) 914 (H) 189 (H)   Review of Glycemic Control  Diabetes history: DM2 Outpatient Diabetes medications: NPH 52 units BID, Novolog 32 units TID with meals Current orders for Inpatient glycemic control: Metformin 1000 mg BID, Tradjenta 5 mg daily  Inpatient Diabetes Program Recommendations: Oral Agents: Agree with oral DM medications as ordered as long as patient is not breastfeeding. Recommend to continue Metformin and Tradjenta as ordered (if patient is not breastfeeding as Jearld Lesch would not be safe to use while breastfeeding). Diet: Please consider changing diet from Regular to Carb Modified.  NOTE: In reviewing the chart, noted that patient had GDM and then was diagnosed with DM2 in May 2016 and was initially started on Metformin and Januvia. Noted CBG of 189 mg/dl at 7:82 today was right after patient had eaten breakfast and that patient refused oral DM medications this morning. Patient had C-Section on 06/25/15 so she no longer has the resistance from the placental hormones. However, since patient has pre-existing DM2 anticipate patient will need oral DM medications for glycemic control at this time. Recommend to continue Metformin and Tradjenta as ordered (if patient is not breastfeeding as Jearld Lesch would not be safe to use while breastfeeding).  Metformin is a Biguanide that works by decreasing hepatic glucose output and makes the body more sensitive to insulin. Jearld Lesch is a DPP-4 inhibitor and works by prolonging action of  gut hormones, increasing insulin secretion, and delaying gastric emptying. Recommend patient continue to check glucose 2-3 times per day after discharge and follow up with PCP within 1 week regarding glycemic control. Advise patient to call PCP if glucose is less than 70 mg/dl or consistently greater than 200 mg/dl.  Thanks, Orlando Penner, RN, MSN, CDE Diabetes Coordinator Inpatient Diabetes Program [phone] (Team Pager from 8am to 5pm) [phone] ([place] office) [phone] [facility] office) [phone] [facility] office)

## 2015-06-26 NOTE — Progress Notes (Signed)
CSW acknowledges consult for THC and MOB's flattened affect.   CSW to complete assessment on the morning of 3/3.

## 2015-06-27 ENCOUNTER — Other Ambulatory Visit: Payer: Self-pay | Admitting: Obstetrics and Gynecology

## 2015-06-27 LAB — RH IG WORKUP (INCLUDES ABO/RH)
ABO/RH(D): B NEG
GESTATIONAL AGE(WKS): 39
Unit division: 0

## 2015-06-27 LAB — GLUCOSE, CAPILLARY: GLUCOSE-CAPILLARY: 144 mg/dL — AB (ref 65–99)

## 2015-06-27 MED ORDER — LINAGLIPTIN 5 MG PO TABS: 5.0000 mg | ORAL_TABLET | Freq: Every day | ORAL | Status: DC

## 2015-06-27 MED ORDER — IBUPROFEN 600 MG PO TABS: 600.0000 mg | ORAL_TABLET | Freq: Four times a day (QID) | ORAL | Status: DC | PRN

## 2015-06-27 MED ORDER — METFORMIN HCL ER 500 MG PO TB24: 2000.0000 mg | ORAL_TABLET | Freq: Every day | ORAL | Status: DC

## 2015-06-27 MED ORDER — OXYCODONE-ACETAMINOPHEN 5-325 MG PO TABS: 1.0000 | ORAL_TABLET | Freq: Four times a day (QID) | ORAL | Status: DC | PRN

## 2015-06-27 NOTE — Clinical Social Work Maternal (Signed)
CLINICAL SOCIAL WORK MATERNAL/CHILD NOTE  Patient Details  Name: Cheryl Parker MRN: [id number] Date of Birth: 05/12/1980  Date:  06/27/2015  Clinical Social Worker Initiating Note:  Lucita Ferrara MSW, LCSW Date/ Time Initiated:  06/27/15/0930     Child's Name:  Bernerd Pho   Legal Guardian:  [name] [place]  Need for Interpreter:  None   Date of Referral:  06/26/15     Reason for Referral:  Current Substance Use/Substance Use During Pregnancy-- [facility] use Referral Source:  Physician   Address:  [address] [place], [place] [address]  Phone number:  [phone]   Household Members:  Minor Children, Significant Other   Natural Supports (not living in the home):  Extended Family, Immediate Family   Professional Supports: None   Employment: Homemaker   Type of Work: FOB is employed in the [employer].   Education:    N/A  Museum/gallery curator Resources:  Medicaid   Other Resources:  Physicist, medical , [place]   Cultural/Religious Considerations Which May Impact Care:  None reported  Strengths:  Ability to meet basic needs , Home prepared for child , Pediatrician chosen    Risk Factors/Current Problems:   1. Substance Use: MOB presents with history of THC use in pregnancy (+UDS 12/23/14).  MOB denied any substance use once she learned that she was pregnant; however, infant's UDS is positive for THC. Umbilical cord is pending.  Cognitive State:  Able to Concentrate , Alert , Goal Oriented , Linear Thinking    Mood/Affect:  Euthymic , Comfortable , Calm    CSW Assessment:  CSW received request for consult due to MOB presenting with a history of THC use during pregnancy with current concerns for MOB's flattened affect.  MOB was alone in her room upon CSW arrival. She was observed to be holding and caring for her infant during the entire assessment.  MOB does present with a flattened affect, but she is able to smile and display a full range in affect. MOB  engaged in a linear and goal orientated conversation, and displayed future orientated thought process.   MOB denied questions, concerns, or needs as she transitions postpartum and prepares for her upcoming discharge. She shared belief that she is "ready".  MOB stated that she lives with the FOB and her two other sons (ages 62 and 57). MOB shared that her two older sons have not yet met the infant, and she is curious to see how they adjust since she does not believe that they fully understood what it means to be pregnant.  MOB shared that she feels well supported by her siblings and grandmother who live nearby. 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  MOB reported that the home is prepared for the infant, and all basic needs are met.  MOB denied prior mental health history, and denied any history of perinatal mood disorders. MOB denied any mental health complications during this pregnancy.  MOB was receptive to education on perinatal mood disorders, and CSW reviewed heightened risk due to MOB's history of diabetes. MOB denied questions or concerns, agreed to monitor her mood, and to follow up with medical providers if she notes onset of symptoms.   MOB was a limited and vague historian regarding substance use history during the pregnancy.  She stated that she used THC early in pregnancy to assist with intense nausea, but reported that she stopped all use once she learned that she  was pregnancy. MOB denied belief that substance use is a presenting problem.  CSW provided education on the hospital drug screen policy, and she denied questions or concerns.  CSW informed MOB that the infant's UDS is negative, and MOB continued to deny any substance use since early in pregnancy.  She never confirmed THC use, but she acknowledged that there needs to be recent use in order for the infant to test positive. MOB denied prior CPS involvement, did not ask questions about what to anticipate and  expect, and responded minimally to the information.  CSW provided information to MOB, and she denied further questions or concerns.    CSW Plan/Description:   1. Patient/Family Education-- Perinatal mood and anxiety disorders, hospital drug screen policy 2.  Child Protective Service Report -- [facility] CPS report made on 3/3.  3. CSW to monitor infant's cord tissue, and will follow up with CPS. 4. No Further Intervention Required/No Barriers to Discharge    Sheilah Mins, LCSW 06/27/2015, 10:18 AM

## 2015-06-27 NOTE — Progress Notes (Signed)
Inpatient Diabetes Program Recommendations  AACE/ADA: New Consensus Statement on Inpatient Glycemic Control (2015)  Target Ranges:  Prepandial:   less than 140 mg/dL      Peak postprandial:   less than 180 mg/dL (1-2 hours)      Critically ill patients:  140 - 180 mg/dL  Results for Cheryl Parker, Cheryl Parker (MRN [id number]) as of 06/27/2015 08:39  Ref. Range 06/26/2015 12:32 06/26/2015 18:28  Glucose-Capillary Latest Ref Range: 65-99 mg/dL 409134 (H) 811192 (H)   Review of Glycemic Control Diabetes history: DM2 Outpatient Diabetes medications: NPH 52 units BID, Novolog 32 units TID with meals Current orders for Inpatient glycemic control: Metformin XR 500 mg QAM, Tradjenta 5 mg daily  Inpatient Diabetes Program Recommendations: Oral Agents: Agree with oral DM medications as ordered as long as patient is not breastfeeding. Recommend to continue Metformin and Tradjenta as ordered (if patient is not breastfeeding as [name] would not be safe to use while breastfeeding). Diet: Please have patient follow a Carb Modified diet.  NOTE: In reviewing the chart, noted that patient had GDM and then was diagnosed with DM2 in May 2016 and was initially started on Metformin and Januvia. Talked with the patient over the phone regarding glycemic control and ordered DM medications for diabetes control. Fasting glucose 144 mg/dl this morning. Discussed pre-existing DM2 history and explained to patient that she will need oral DM medications for glycemic control at this time. Patient stated that she did not want to take Metformin because it caused bowel upset with her. Explained that Metformin was changed to extended release which helps with GI upset.  Discussed Metformin XR and Tradjenta and how they work to help control glucose. Patient states that she has insulin at home that she can take for glycemic control. Explained to the patient that now that she is no longer pregnant she may be able to control glucose with diet and oral DM  medications. Encouraged patient to take DM medications as prescribed and patient states that she will take the DM medications. Asked patient to continue to check glucose 2-3 times per day after discharge and follow up with PCP within 1 week regarding glycemic control. Advised patient to call PCP if glucose is less than 70 mg/dl or consistently greater than 200 mg/dl. Patient verbalized understanding of information discussed and she states that she has no further questions at this time.  Thanks, Cheryl PennerMarie Lumi Winslett, RN, MSN, CDE Diabetes Coordinator Inpatient Diabetes Program [phone] (Team Pager from 8am to 5pm) [phone] ([place] office) [phone] [facility] office) [phone] [facility] office)

## 2015-06-27 NOTE — Progress Notes (Signed)
Fasting blood sugar 144. Pt states cranberry juice with AM medication.

## 2015-06-27 NOTE — Discharge Instructions (Signed)
Cesarean Delivery, Care After  Refer to this sheet in the next few weeks. These instructions provide you with information on caring for yourself after your procedure. Your health care provider may also give you specific instructions. Your treatment has been planned according to current medical practices, but problems sometimes occur. Call your health care provider if you have any problems or questions after you go home.  HOME CARE INSTRUCTIONS   Only take over-the-counter or prescription medications as directed by your health care provider.   Do not drink alcohol, especially if you are breastfeeding or taking medication to relieve pain.   Do not chew or smoke tobacco.   Continue to use good perineal care. Good perineal care includes:    Wiping your perineum from front to back.    Keeping your perineum clean.   Check your surgical cut (incision) daily for increased redness, drainage, swelling, or separation of skin.   Clean your incision gently with soap and water every day, and then pat it dry. If your health care provider says it is okay, leave the incision uncovered. Use a bandage (dressing) if the incision is draining fluid or appears irritated. If the adhesive strips across the incision do not fall off within 7 days, carefully peel them off.   Hug a pillow when coughing or sneezing until your incision is healed. This helps to relieve pain.   Do not use tampons or douche until your health care provider says it is okay.   Shower, wash your hair, and take tub baths as directed by your health care provider.   Wear a well-fitting bra that provides breast support.   Limit wearing support panties or control-top hose.   Drink enough fluids to keep your urine clear or pale yellow.   Eat high-fiber foods such as whole grain cereals and breads, brown rice, beans, and fresh fruits and vegetables every day. These foods may help prevent or relieve constipation.   Resume activities such as climbing stairs,  driving, lifting, exercising, or traveling as directed by your health care provider.   Talk to your health care provider about resuming sexual activities. This is dependent upon your risk of infection, your rate of healing, and your comfort and desire to resume sexual activity.   Try to have someone help you with your household activities and your newborn for at least a few days after you leave the hospital.   Rest as much as possible. Try to rest or take a nap when your newborn is sleeping.   Increase your activities gradually.   Keep all of your scheduled postpartum appointments. It is very important to keep your scheduled follow-up appointments. At these appointments, your health care provider will be checking to make sure that you are healing physically and emotionally.  SEEK MEDICAL CARE IF:    You are passing large clots from your vagina. Save any clots to show your health care provider.   You have a foul smelling discharge from your vagina.   You have trouble urinating.   You are urinating frequently.   You have pain when you urinate.   You have a change in your bowel movements.   You have increasing redness, pain, or swelling near your incision.   You have pus draining from your incision.   Your incision is separating.   You have painful, hard, or reddened breasts.   You have a severe headache.   You have blurred vision or see spots.   You feel sad   or depressed.   You have thoughts of hurting yourself or your newborn.   You have questions about your care, the care of your newborn, or medications.   You are dizzy or light-headed.   You have a rash.   You have pain, redness, or swelling at the site of the removed intravenous access (IV) tube.   You have nausea or vomiting.   You stopped breastfeeding and have not had a menstrual period within 12 weeks of stopping.   You are not breastfeeding and have not had a menstrual period within 12 weeks of delivery.   You have a fever.  SEEK  IMMEDIATE MEDICAL CARE IF:   You have persistent pain.   You have chest pain.   You have shortness of breath.   You faint.   You have leg pain.   You have stomach pain.   Your vaginal bleeding saturates 2 or more sanitary pads in 1 hour.  MAKE SURE YOU:    Understand these instructions.   Will watch your condition.   Will get help right away if you are not doing well or get worse.     This information is not intended to replace advice given to you by your health care provider. Make sure you discuss any questions you have with your health care provider.     Document Released: 01/02/2002 Document Revised: 05/03/2014 Document Reviewed: 12/08/2011  Elsevier Interactive Patient Education 2016 Elsevier Inc.

## 2015-06-27 NOTE — Discharge Summary (Signed)
OB Discharge Summary     Patient Name: Cheryl Parker DOB: 12-31-1980 MRN: [id number]  Date of admission: 06/25/2015 Delivering Cheryl Parker: Lyndel Safe NILES   Date of discharge: 06/27/2015  Admitting diagnosis: cpt 40981 REPEAT c section Intrauterine pregnancy: [redacted]w[redacted]d     Secondary diagnosis:  Active Problems:   Status post repeat low transverse cesarean section pregestational diabetes mellitus Rh negative Advanced maternal age  Additional problems: none     Discharge diagnosis: Term Pregnancy Delivered and Type 2 DM                                                                                                Post partum procedures:none  Augmentation: none  Complications: None  Hospital course:  Sceduled C/S   35 y.o. yo G3P3003 at [redacted]w[redacted]d was admitted to the hospital 06/25/2015 for scheduled cesarean section with the following indication: history prior c/s.  Membrane Rupture Time/Date: 9:53 AM ,06/25/2015   Patient delivered a Viable infant.06/25/2015  Details of operation can be found in separate operative note.  Pateint had an uncomplicated postpartum course.  She is ambulating, tolerating a regular diet, passing flatus, and urinating well. Patient is discharged home in stable condition on  06/27/2015        Elevated glucose PP, patient's prior diabetes medications restarted; continuing linagliptin as patient bottle feeding, warned to discuss w/ Cheryl Parker if decides to switch to breastfeeding. Stressed importance of 6 wk f/u and establishing with a PCP.   Noted in patient's chart that PMH includes HTN. Patient unaware. BP not elevated this pregnancy and pt was not treated as chronic hypertension, but some BPs PP mildly elevated with one reading of 150. Asymptomatic. Patient discharged with strict preeclampsia return precautions and strong encouragement of PCP f/u.  Rhogam given. H wnl after c/s.  Physical exam  Filed Vitals:   06/26/15 1422 06/26/15 1520 06/26/15 1830 06/27/15 0558  BP:  150/83 142/82 128/85 138/89  Pulse: 84 83 94 93  Temp: 98.4 F (36.9 C)  98.3 F (36.8 C) 97.7 F (36.5 C)  TempSrc:   Oral Oral  Resp: SpO2: 100%      General: alert, cooperative and no distress Lochia: appropriate Uterine Fundus: firm Incision: Healing well with no significant drainage, No significant erythema, Dressing is clean, dry, and intact DVT Evaluation: No cords or calf tenderness. No significant calf/ankle edema. Labs: Lab Results  Component Value Date   WBC 9.6 06/26/2015   HGB 12.9 06/26/2015   HCT 38.9 06/26/2015   MCV 94.0 06/26/2015   PLT 235 06/26/2015   CMP Latest Ref Rng 06/23/2015  Glucose 65 - 99 mg/dL 191(Y)  BUN 6 - 20 mg/dL 10  Creatinine 7.82 - 9.56 mg/dL 2.13  Sodium 086 - 578 mmol/L 135  Potassium 3.5 - 5.1 mmol/L 3.6  Chloride 101 - 111 mmol/L 108  CO2 22 - 32 mmol/L 19(L)  Calcium 8.9 - 10.3 mg/dL 4.6(N)  Total Protein 6.5 - 8.1 g/dL -  Total Bilirubin 0.3 - 1.2 mg/dL -  Alkaline Phos 38 - 629 U/L -  AST  15 - 41 U/L -  ALT 14 - 54 U/L -    Discharge instruction: per After Visit Summary and "Baby and Me Booklet".  After visit meds:    Medication List    STOP taking these medications        ACCU-CHEK FASTCLIX LANCETS Misc     aspirin 81 MG chewable tablet     famotidine 20 MG tablet  Commonly known as:  PEPCID     glucose blood test strip  Commonly known as:  ACCU-CHEK AVIVA PLUS     insulin aspart 100 UNIT/ML injection  Commonly known as:  novoLOG     insulin NPH Human 100 UNIT/ML injection  Commonly known as:  HUMULIN N,NOVOLIN N     ondansetron 4 MG disintegrating tablet  Commonly known as:  ZOFRAN ODT      TAKE these medications        acetaminophen 325 MG tablet  Commonly known as:  TYLENOL  Take 650 mg by mouth every 6 (six) hours as needed for mild pain or headache.     ibuprofen 600 MG tablet  Commonly known as:  ADVIL,MOTRIN  Take 1 tablet (600 mg total) by mouth every 6 (six) hours as needed  for mild pain.     linagliptin 5 MG Tabs tablet  Commonly known as:  TRADJENTA  Take 1 tablet (5 mg total) by mouth daily.     metFORMIN 500 MG 24 hr tablet  Commonly known as:  GLUCOPHAGE-XR  Take 4 tablets (2,000 mg total) by mouth daily with breakfast.     oxyCODONE-acetaminophen 5-325 MG tablet  Commonly known as:  PERCOCET/ROXICET  Take 1 tablet by mouth every 6 (six) hours as needed (pain scale 4-7).        Diet: carb modified diet  Activity: Advance as tolerated. Pelvic rest for 6 weeks.   Outpatient follow up:6 weeks Follow up Appt:Future Appointments Date Time Provider Department Center  08/04/2015 1:00 PM Kathrynn RunningNoah Bedford Wouk, Cheryl Parker [facility]   Follow up Visit:No Follow-up on file.  Postpartum contraception: condoms, considering BTL, will have our staff call to schedule outpatient visit.  Newborn Data: Live born female  Birth Weight: 6 lb 10 oz (3005 g) APGAR: 9, 9  Baby Feeding: Bottle Disposition:home with mother   06/27/2015 Cheryl BilisNoah B Wouk, Cheryl Parker

## 2015-07-10 ENCOUNTER — Ambulatory Visit (INDEPENDENT_AMBULATORY_CARE_PROVIDER_SITE_OTHER): Payer: Medicaid Other | Admitting: Obstetrics & Gynecology

## 2015-07-10 ENCOUNTER — Encounter: Payer: Self-pay | Admitting: Obstetrics & Gynecology

## 2015-07-10 VITALS — BP 160/85 | HR 84 | Temp 99.1°F | Ht 62.0 in | Wt 161.0 lb

## 2015-07-10 DIAGNOSIS — O165 Unspecified maternal hypertension, complicating the puerperium: Secondary | ICD-10-CM

## 2015-07-10 LAB — POCT PREGNANCY, URINE
PREG TEST UR: POSITIVE — AB
Preg Test, Ur: NEGATIVE

## 2015-07-10 MED ORDER — HYDROCHLOROTHIAZIDE 25 MG PO TABS: 25.0000 mg | ORAL_TABLET | Freq: Every day | ORAL | Status: DC

## 2015-07-10 NOTE — Patient Instructions (Signed)
Postpartum Hypertension  Postpartum hypertension is high blood pressure after pregnancy that remains higher than normal for more than two days after delivery. You may not realize that you have postpartum hypertension if your blood pressure is not being checked regularly. In some cases, postpartum hypertension will go away on its own, usually within a week of delivery. However, for some women, medical treatment is required to prevent serious complications, such as seizures or stroke.  The following things can affect your blood pressure:  · The type of delivery you had.  · Having received IV fluids or other medicines during or after delivery.  CAUSES   Postpartum hypertension may be caused by any of the following or by a combination of any of the following:  · Hypertension that existed before pregnancy (chronic hypertension).  · Gestational hypertension.  · Preeclampsia or eclampsia.  · Receiving a lot of fluid through an IV during or after delivery.  · Medicines.  · HELLP syndrome.  · Hyperthyroidism.  · Stroke.  · Other rare neurological or blood disorders.  In some cases, the cause may not be known.  RISK FACTORS  Postpartum hypertension can be related to one or more risk factors, such as:  · Chronic hypertension. In some cases, this may not have been diagnosed before pregnancy.  · Obesity.  · Type 2 diabetes.  · Kidney disease.  · Family history of preeclampsia.  · Other medical conditions that cause hormonal imbalances.  SIGNS AND SYMPTOMS  As with all types of hypertension, postpartum hypertension may not have any symptoms. Depending on how high your blood pressure is, you may experience:  · Headaches. These may be mild, moderate, or severe. They may also be steady, constant, or sudden in onset (thunderclap headache).  · Visual changes.  · Dizziness.  · Shortness of breath.  · Swelling of your hands, feet, lower legs, or face. In some cases, you may have swelling in more than one of these locations.  · Heart  palpitations or a racing heartbeat.  · Difficulty breathing while lying down.  · Decreased urination.  Other rare signs and symptoms may include:  · Sweating more than usual. This lasts longer than a few days after delivery.  · Chest pain.  · Sudden dizziness when you get up from sitting or lying down.  · Seizures.  · Nausea or vomiting.  · Abdominal pain.  DIAGNOSIS  The diagnosis of postpartum hypertension is made through a combination of physical examination findings and testing of your blood and urine. You may also have additional tests, such as a CT scan or an MRI, to check for other complications of postpartum hypertension.  TREATMENT  When blood pressure is high enough to require treatment, your options may include:  · Medicines to reduce blood pressure (antihypertensives). Tell your health care provider if you are breastfeeding or if you plan to breastfeed. There are many antihypertensive medicines that are safe to take while breastfeeding.  · Stopping medicines that may be causing hypertension.  · Treating medical conditions that are causing hypertension.  · Treating the complications of hypertension, such as seizures, stroke, or kidney problems.  Your health care provider will also continue to monitor your blood pressure closely and repeatedly until it is within a safe range for you.   HOME CARE INSTRUCTIONS  · Take medicines only as directed by your health care provider.  · Get regular exercise after your health care provider tells you that it is safe.  · Follow   your health care provider's recommendations on fluid and salt restrictions.  · Do not use any tobacco products, including cigarettes, chewing tobacco, or electronic cigarettes. If you need help quitting, ask your health care provider.  · Keep all follow-up visits as directed by your health care provider. This is important.  SEEK MEDICAL CARE IF:  · Your symptoms get worse.  · You have new symptoms, such as:    Headache.    Dizziness.    Visual  changes.  SEEK IMMEDIATE MEDICAL CARE IF:  · You develop a severe or sudden headache.  · You have seizures.  · You develop numbness or weakness on one side of your body.  · You have difficulty thinking, speaking, or swallowing.  · You develop severe abdominal pain.  · You develop difficulty breathing, chest pain, a racing heartbeat, or heart palpitations.  These symptoms may represent a serious problem that is an emergency. Do not wait to see if the symptoms will go away. Get medical help right away. Call your local emergency services (911 in the [place] Do not drive yourself to the hospital.     This information is not intended to replace advice given to you by your health care provider. Make sure you discuss any questions you have with your health care provider.     Document Released: 12/14/2013 Document Reviewed: 12/14/2013  Elsevier Interactive Patient Education ©2016 [employer]

## 2015-07-10 NOTE — Progress Notes (Signed)
Subjective:     Cheryl Parker is a 35 y.o. female who presents for a postpartum visit. She is 2 weeks postpartum following a low cervical transverse Cesarean section. I have fully reviewed the prenatal and intrapartum course. The delivery was at 39 gestational weeks. Outcome: repeat cesarean section, low transverse incision. Anesthesia: spinal. Postpartum course has been complicated by odor from incision. Baby's course has been good. Baby is feeding by bottle -  . Bleeding no bleeding. Bowel function is normal. Bladder function is normal. Patient is not sexually active. Contraception method is not sure. Postpartum depression screening: negative.  The following portions of the patient's history were reviewed and updated as appropriate: allergies, current medications, past family history, past medical history, past social history, past surgical history and problem list.  Review of Systems Pertinent items are noted in HPI.   Objective:    BP 160/85 mmHg  Pulse 84  Temp(Src) 99.1 F (37.3 C) (Oral)  Ht 5\' 2"  (1.575 m)  Wt 161 lb (73.029 kg)  BMI 29.44 kg/m2  Breastfeeding? No  General:  alert, cooperative and no distress           Abdomen: incision intact with some granulation tissue       Assessment:     2 week postpartum exam. Pap smear not done at today's visit.  Systolic hypertension Plan:    1. Contraception:undecided 2. HCTZ 25 mg daily 3. Follow up in: 1 week or as needed. BP check  Adam PhenixJames G Krishika Bugge, MD 07/10/2015

## 2015-07-10 NOTE — Progress Notes (Signed)
Patient reports headaches and blurry vision since being home. Reports off and on swelling

## 2015-08-04 ENCOUNTER — Encounter: Payer: Self-pay | Admitting: Obstetrics and Gynecology

## 2015-08-04 ENCOUNTER — Ambulatory Visit (INDEPENDENT_AMBULATORY_CARE_PROVIDER_SITE_OTHER): Payer: Medicaid Other | Admitting: Obstetrics and Gynecology

## 2015-08-04 VITALS — BP 140/88 | HR 89 | Wt 159.1 lb

## 2015-08-04 DIAGNOSIS — Z30011 Encounter for initial prescription of contraceptive pills: Secondary | ICD-10-CM

## 2015-08-04 DIAGNOSIS — IMO0001 Reserved for inherently not codable concepts without codable children: Secondary | ICD-10-CM

## 2015-08-04 DIAGNOSIS — I10 Essential (primary) hypertension: Secondary | ICD-10-CM

## 2015-08-04 DIAGNOSIS — E1159 Type 2 diabetes mellitus with other circulatory complications: Secondary | ICD-10-CM | POA: Insufficient documentation

## 2015-08-04 DIAGNOSIS — R030 Elevated blood-pressure reading, without diagnosis of hypertension: Secondary | ICD-10-CM

## 2015-08-04 LAB — COMPREHENSIVE METABOLIC PANEL
ALT: 78 U/L — ABNORMAL HIGH (ref 6–29)
AST: 23 U/L (ref 10–30)
Albumin: 4.1 g/dL (ref 3.6–5.1)
Alkaline Phosphatase: 95 U/L (ref 33–115)
BUN: 9 mg/dL (ref 7–25)
CALCIUM: 9.4 mg/dL (ref 8.6–10.2)
CO2: 24 mmol/L (ref 20–31)
Chloride: 103 mmol/L (ref 98–110)
Creat: 0.46 mg/dL — ABNORMAL LOW (ref 0.50–1.10)
GLUCOSE: 153 mg/dL — AB (ref 65–99)
POTASSIUM: 4.1 mmol/L (ref 3.5–5.3)
Sodium: 134 mmol/L — ABNORMAL LOW (ref 135–146)
Total Bilirubin: 0.3 mg/dL (ref 0.2–1.2)
Total Protein: 6.4 g/dL (ref 6.1–8.1)

## 2015-08-04 MED ORDER — LEVONORGESTREL-ETHINYL ESTRAD 0.1-20 MG-MCG PO TABS: 1.0000 | ORAL_TABLET | Freq: Every day | ORAL | Status: DC

## 2015-08-04 NOTE — Progress Notes (Signed)
CLINIC ENCOUNTER NOTE  History:  35 y.o. [id number] here today for f/u HTN.  C/s 3/1. Pp f/u few weeks later and bp elevated, started hctz. Taking daily, including this AM. Does not have PCP. No ha, vision change, abdominal pain. Doesn't smoke, no hx migraine w/ aura, no history liver disease of blood clot, no cancer history. Wants to start ocp   Past Medical History  Diagnosis Date  . Hypertension   . Pregnancy induced hypertension   . 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 DKA (diabetic ketoacidoses) (HCC) 09/10/2014  . Headache     Past Surgical History  Procedure Laterality Date  . Cesarean section    . Cesarean section N/A 07/05/2012    Procedure: CESAREAN SECTION;  Surgeon: Catalina AntiguaPeggy Constant, MD;  Location: [facility];  Service: Obstetrics;  Laterality: N/A;  . Cesarean section N/A 06/25/2015    Procedure: CESAREAN SECTION;  Surgeon: Catalina AntiguaPeggy Constant, MD;  Location: [facility];  Service: Obstetrics;  Laterality: N/A;    The following portions of the patient's history were reviewed and updated as appropriate: allergies, current medications, past family history, past medical history, past social history, past surgical history and problem list.   Review of Systems:  See above; comprehensive review of systems was otherwise negative.  Objective:  Physical Exam BP 140/88 mmHg  Pulse 89  Wt 159 lb 1.6 oz (72.167 kg)  Breastfeeding? No CONSTITUTIONAL: Well-developed, well-nourished female in no acute distress.  HENT:  Normocephalic, atraumatic SKIN: Skin is warm and dry.  NEUROLGIC: Alert  PSYCHIATRIC: Normal mood and affect.  CARDIOVASCULAR: Normal heart rate noted RESPIRATORY: Effort and breath sounds normal, no problems with respiration noted ABDOMEN: Soft, no distention noted.  No tenderness, rebound or guarding. Healed low transverse incision. PELVIC: Deferred   Labs and Imaging No results found.  Assessment & Plan:   # Elevated blood pressure -  at last visit and again today, though today borderline. No preE symptoms - continue HCTZ - needs metabolic profile, will check cmp as well as cbc as is within time-frame for preE/hellp, though unlikely - referring for PCP, which also needs 2/2 T2DM - preE return precautions  # Contraceptive mgmt - starting OCP, has taken in past, no absolute contraindications, discussed use and what to do for missed doses      Sayvion Vigen B. Colton Tassin, MD OB/GYN Fellow [facility]

## 2015-08-05 ENCOUNTER — Other Ambulatory Visit: Payer: Self-pay | Admitting: Obstetrics and Gynecology

## 2016-12-23 ENCOUNTER — Encounter (HOSPITAL_COMMUNITY): Payer: Self-pay | Admitting: *Deleted

## 2016-12-23 ENCOUNTER — Inpatient Hospital Stay (HOSPITAL_COMMUNITY)
Admission: AD | Admit: 2016-12-23 | Discharge: 2016-12-24 | Discharge status: Against Medical Advice | Payer: Self-pay | Source: Ambulatory Visit | Attending: Obstetrics & Gynecology | Admitting: Obstetrics & Gynecology

## 2016-12-23 ENCOUNTER — Inpatient Hospital Stay (HOSPITAL_COMMUNITY): Payer: Self-pay

## 2016-12-23 DIAGNOSIS — R1011 Right upper quadrant pain: Secondary | ICD-10-CM | POA: Insufficient documentation

## 2016-12-23 DIAGNOSIS — R1012 Left upper quadrant pain: Secondary | ICD-10-CM | POA: Insufficient documentation

## 2016-12-23 DIAGNOSIS — Z79899 Other long term (current) drug therapy: Secondary | ICD-10-CM | POA: Insufficient documentation

## 2016-12-23 DIAGNOSIS — E1110 Type 2 diabetes mellitus with ketoacidosis without coma: Secondary | ICD-10-CM

## 2016-12-23 DIAGNOSIS — Z87891 Personal history of nicotine dependence: Secondary | ICD-10-CM | POA: Insufficient documentation

## 2016-12-23 LAB — URINALYSIS, ROUTINE W REFLEX MICROSCOPIC
BILIRUBIN URINE: NEGATIVE
Bacteria, UA: NONE SEEN
Glucose, UA: >=500 mg/dL — AB
KETONES UR: 80 mg/dL — AB
LEUKOCYTES UA: NEGATIVE
NITRITE: NEGATIVE
PH: 5 (ref 5.0–8.0)
PROTEIN: 100 mg/dL — AB
Specific Gravity, Urine: 1.027 (ref 1.005–1.030)

## 2016-12-23 LAB — COMPREHENSIVE METABOLIC PANEL
ALT: 86 U/L — AB (ref 14–54)
ALT: 66 U/L — AB (ref 14–54)
ANION GAP: 23 — AB (ref 5–15)
ANION GAP: 12 (ref 5–15)
AST: 24 U/L (ref 15–41)
AST: 24 U/L (ref 15–41)
Albumin: 4.7 g/dL (ref 3.5–5.0)
Albumin: 3.7 g/dL (ref 3.5–5.0)
Alkaline Phosphatase: 171 U/L — ABNORMAL HIGH (ref 38–126)
Alkaline Phosphatase: 130 U/L — ABNORMAL HIGH (ref 38–126)
BILIRUBIN TOTAL: 1 mg/dL (ref 0.3–1.2)
BUN: 15 mg/dL (ref 6–20)
BUN: 12 mg/dL (ref 6–20)
CALCIUM: 9.5 mg/dL (ref 8.9–10.3)
CO2: 13 mmol/L — AB (ref 22–32)
CO2: 17 mmol/L — AB (ref 22–32)
CREATININE: 0.85 mg/dL (ref 0.44–1.00)
CREATININE: 0.61 mg/dL (ref 0.44–1.00)
Calcium: 8.1 mg/dL — ABNORMAL LOW (ref 8.9–10.3)
Chloride: 100 mmol/L — ABNORMAL LOW (ref 101–111)
Chloride: 112 mmol/L — ABNORMAL HIGH (ref 101–111)
Glucose, Bld: 380 mg/dL — ABNORMAL HIGH (ref 65–99)
Glucose, Bld: 180 mg/dL — ABNORMAL HIGH (ref 65–99)
Potassium: 4 mmol/L (ref 3.5–5.1)
Potassium: 4.3 mmol/L (ref 3.5–5.1)
SODIUM: 141 mmol/L (ref 135–145)
Sodium: 136 mmol/L (ref 135–145)
TOTAL PROTEIN: 8.6 g/dL — AB (ref 6.5–8.1)
Total Bilirubin: 0.9 mg/dL (ref 0.3–1.2)
Total Protein: 7.1 g/dL (ref 6.5–8.1)

## 2016-12-23 LAB — BLOOD GAS, ARTERIAL
ACID-BASE DEFICIT: 13.5 mmol/L — AB (ref 0.0–2.0)
Bicarbonate: 12.2 mmol/L — ABNORMAL LOW (ref 20.0–28.0)
DRAWN BY: 29165
FIO2: 0.21
pCO2 arterial: 28.5 mmHg — ABNORMAL LOW (ref 32.0–48.0)
pH, Arterial: 7.255 — ABNORMAL LOW (ref 7.350–7.450)
pO2, Arterial: 104 mmHg (ref 83.0–108.0)

## 2016-12-23 LAB — CBC WITH DIFFERENTIAL/PLATELET
BASOS ABS: 0 10*3/uL (ref 0.0–0.1)
BASOS PCT: 0 %
EOS ABS: 0 10*3/uL (ref 0.0–0.7)
EOS PCT: 0 %
HEMATOCRIT: 45.6 % (ref 36.0–46.0)
Hemoglobin: 15.7 g/dL — ABNORMAL HIGH (ref 12.0–15.0)
Lymphocytes Relative: 5 %
Lymphs Abs: 1 10*3/uL (ref 0.7–4.0)
MCH: 32.1 pg (ref 26.0–34.0)
MCHC: 34.4 g/dL (ref 30.0–36.0)
MCV: 93.3 fL (ref 78.0–100.0)
MONO ABS: 0.2 10*3/uL (ref 0.1–1.0)
Monocytes Relative: 1 %
NEUTROS ABS: 17.7 10*3/uL — AB (ref 1.7–7.7)
Neutrophils Relative %: 94 %
PLATELETS: 383 10*3/uL (ref 150–400)
RBC: 4.89 MIL/uL (ref 3.87–5.11)
RDW: 12.6 % (ref 11.5–15.5)
WBC: 18.9 10*3/uL — ABNORMAL HIGH (ref 4.0–10.5)

## 2016-12-23 LAB — POCT PREGNANCY, URINE: Preg Test, Ur: NEGATIVE

## 2016-12-23 LAB — GLUCOSE, CAPILLARY
GLUCOSE-CAPILLARY: 193 mg/dL — AB (ref 65–99)
Glucose-Capillary: 256 mg/dL — ABNORMAL HIGH (ref 65–99)

## 2016-12-23 LAB — LIPASE, BLOOD: Lipase: 23 U/L (ref 11–51)

## 2016-12-23 MED ORDER — SODIUM CHLORIDE 0.9 % IV BOLUS (SEPSIS)
1000.0000 mL | Freq: Once | INTRAVENOUS | Status: AC
  Administered 2016-12-23: 1000 mL via INTRAVENOUS

## 2016-12-23 MED ORDER — OXYCODONE-ACETAMINOPHEN 5-325 MG PO TABS: 1.0000 | ORAL_TABLET | Freq: Once | ORAL | Status: DC

## 2016-12-23 MED ORDER — FAMOTIDINE IN NACL 20-0.9 MG/50ML-% IV SOLN
20.0000 mg | Freq: Once | INTRAVENOUS | Status: AC
  Administered 2016-12-23: 20 mg via INTRAVENOUS
  Filled 2016-12-23: qty 50

## 2016-12-23 MED ORDER — OXYCODONE HCL 5 MG PO TABS
5.0000 mg | ORAL_TABLET | Freq: Once | ORAL | Status: AC
Start: 2016-12-23 — End: 2016-12-23
  Administered 2016-12-23: 5 mg via ORAL
  Filled 2016-12-23: qty 1

## 2016-12-23 MED ORDER — HYDROMORPHONE HCL 1 MG/ML IJ SOLN
1.0000 mg | Freq: Once | INTRAMUSCULAR | Status: AC
Start: 2016-12-23 — End: 2016-12-23
  Administered 2016-12-23: 1 mg via INTRAVENOUS
  Filled 2016-12-23: qty 1

## 2016-12-23 MED ORDER — INSULIN REGULAR HUMAN 100 UNIT/ML IJ SOLN
10.0000 [IU] | Freq: Once | INTRAMUSCULAR | Status: AC
  Administered 2016-12-23: 10 [IU] via SUBCUTANEOUS
  Filled 2016-12-23: qty 1

## 2016-12-23 MED ORDER — INSULIN REGULAR HUMAN 100 UNIT/ML IJ SOLN
5.0000 [IU] | Freq: Once | INTRAMUSCULAR | Status: AC
  Administered 2016-12-23: 5 [IU] via SUBCUTANEOUS
  Filled 2016-12-23: qty 1

## 2016-12-23 MED ORDER — SODIUM CHLORIDE 0.9 % IV SOLN
8.0000 mg | Freq: Once | INTRAVENOUS | Status: AC
  Administered 2016-12-23: 8 mg via INTRAVENOUS
  Filled 2016-12-23: qty 4

## 2016-12-23 NOTE — MAU Provider Note (Signed)
History     CSN: [id number]  Arrival date and time: 12/23/16 1625   First Provider Initiated Contact with Patient 12/23/16 1700      Chief Complaint  Patient presents with  . Abdominal Pain  . Emesis   36 y.o non-pregnant female here with N/V and upper abdominal pain x3 days. Pain is upper abdomen, bilateral, constant, sharp and achy. Rates 10/10. Tried Tylenol but did not help. She is unable to tolerate anything po. No diarrhea. Last BM 3 days ago. No fevers. Reports kids and partner have been sick with colds but no GI sx.    Past Medical History:  Diagnosis Date  . 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 DKA (diabetic ketoacidoses) (HCC) 09/10/2014  . Gestational diabetes   . Headache   . Hypertension   . Pregnancy induced hypertension     Past Surgical History:  Procedure Laterality Date  . CESAREAN SECTION    . CESAREAN SECTION N/A 07/05/2012   Procedure: CESAREAN SECTION;  Surgeon: Catalina AntiguaPeggy Constant, MD;  Location: [facility];  Service: Obstetrics;  Laterality: N/A;  . CESAREAN SECTION N/A 06/25/2015   Procedure: CESAREAN SECTION;  Surgeon: Catalina AntiguaPeggy Constant, MD;  Location: [facility];  Service: Obstetrics;  Laterality: N/A;    Family History  Problem Relation Age of Onset  . Diabetes Mother   . Kidney disease Father   . Birth defects Son        unilateral renal agenesis    Social History  Substance Use Topics  . Smoking status: Former Games developermoker  . Smokeless tobacco: Never Used  . Alcohol use No    Allergies: No Known Allergies  Prescriptions Prior to Admission  Medication Sig Dispense Refill Last Dose  . hydrochlorothiazide (HYDRODIURIL) 25 MG tablet Take 1 tablet (25 mg total) by mouth daily. 30 tablet 3 Taking  . ibuprofen (ADVIL,MOTRIN) 600 MG tablet Take 1 tablet (600 mg total) by mouth every 6 (six) hours as needed for mild pain. 90 tablet 1 Taking  . levonorgestrel-ethinyl estradiol (LUTERA) 0.1-20 MG-MCG tablet Take 1 tablet by mouth  daily. 1 Package 11   . metFORMIN (GLUCOPHAGE-XR) 500 MG 24 hr tablet TAKE 4 TABLETS(2000 MG) BY MOUTH DAILY WITH BREAKFAST 360 tablet 1 Taking  . TRADJENTA 5 MG TABS tablet TAKE 1 TABLET(5 MG) BY MOUTH DAILY 30 tablet 0     Review of Systems  Constitutional: Negative for chills and fever.  Gastrointestinal: Positive for abdominal pain.   Physical Exam   Blood pressure 109/62, pulse (!) 105, temperature 98.2 F (36.8 C), temperature source Oral, resp. rate 16, height 5\' 2"  (1.575 m), weight 142 lb (64.4 kg), last menstrual period 10/24/2016, SpO2 99 %, not currently breastfeeding.  Physical Exam  Constitutional: She is oriented to person, place, and time. She appears well-developed and well-nourished. She appears distressed (writhing in bed).  HENT:  Head: Normocephalic and atraumatic.  Neck: Normal range of motion.  Respiratory: Effort normal. No respiratory distress.  GI: Soft. She exhibits no distension and no mass. There is tenderness (RUQ). There is no rebound and no guarding.  Musculoskeletal: Normal range of motion.  Neurological: She is alert and oriented to person, place, and time.  Skin: Skin is warm and dry.  Psychiatric: She has a normal mood and affect.   Results for orders placed or performed during the hospital encounter of 12/23/16 (from the past 24 hour(s))  Urinalysis, Routine w reflex microscopic     Status: Abnormal  Collection Time: 12/23/16  4:35 PM  Result Value Ref Range   Color, Urine STRAW (A) YELLOW   APPearance CLEAR CLEAR   Specific Gravity, Urine 1.027 1.005 - 1.030   pH 5.0 5.0 - 8.0   Glucose, UA >=500 (A) NEGATIVE mg/dL   Hgb urine dipstick MODERATE (A) NEGATIVE   Bilirubin Urine NEGATIVE NEGATIVE   Ketones, ur 80 (A) NEGATIVE mg/dL   Protein, ur 161 (A) NEGATIVE mg/dL   Nitrite NEGATIVE NEGATIVE   Leukocytes, UA NEGATIVE NEGATIVE   RBC / HPF 0-5 0 - 5 RBC/hpf   WBC, UA 0-5 0 - 5 WBC/hpf   Bacteria, UA NONE SEEN NONE SEEN   Squamous  Epithelial / LPF 0-5 (A) NONE SEEN  Pregnancy, urine POC     Status: None   Collection Time: 12/23/16  4:49 PM  Result Value Ref Range   Preg Test, Ur NEGATIVE NEGATIVE  Lipase, blood     Status: None   Collection Time: 12/23/16  5:40 PM  Result Value Ref Range   Lipase 23 11 - 51 U/L  Comprehensive metabolic panel     Status: Abnormal   Collection Time: 12/23/16  5:40 PM  Result Value Ref Range   Sodium 136 135 - 145 mmol/L   Potassium 4.0 3.5 - 5.1 mmol/L   Chloride 100 (L) 101 - 111 mmol/L   CO2 13 (L) 22 - 32 mmol/L   Glucose, Bld 380 (H) 65 - 99 mg/dL   BUN 15 6 - 20 mg/dL   Creatinine, Ser 0.96 0.44 - 1.00 mg/dL   Calcium 9.5 8.9 - 04.5 mg/dL   Total Protein 8.6 (H) 6.5 - 8.1 g/dL   Albumin 4.7 3.5 - 5.0 g/dL   AST 24 15 - 41 U/L   ALT 86 (H) 14 - 54 U/L   Alkaline Phosphatase 171 (H) 38 - 126 U/L   Total Bilirubin 1.0 0.3 - 1.2 mg/dL   GFR calc non Af Amer >60 >60 mL/min   GFR calc Af Amer >60 >60 mL/min   Anion gap 23 (H) 5 - 15  CBC with Differential/Platelet     Status: Abnormal   Collection Time: 12/23/16  5:40 PM  Result Value Ref Range   WBC 18.9 (H) 4.0 - 10.5 K/uL   RBC 4.89 3.87 - 5.11 MIL/uL   Hemoglobin 15.7 (H) 12.0 - 15.0 g/dL   HCT 40.9 81.1 - 91.4 %   MCV 93.3 78.0 - 100.0 fL   MCH 32.1 26.0 - 34.0 pg   MCHC 34.4 30.0 - 36.0 g/dL   RDW 78.2 95.6 - 21.3 %   Platelets 383 150 - 400 K/uL   Neutrophils Relative % 94 %   Neutro Abs 17.7 (H) 1.7 - 7.7 K/uL   Lymphocytes Relative 5 %   Lymphs Abs 1.0 0.7 - 4.0 K/uL   Monocytes Relative 1 %   Monocytes Absolute 0.2 0.1 - 1.0 K/uL   Eosinophils Relative 0 %   Eosinophils Absolute 0.0 0.0 - 0.7 K/uL   Basophils Relative 0 %   Basophils Absolute 0.0 0.0 - 0.1 K/uL  Blood gas, arterial     Status: Abnormal   Collection Time: 12/23/16  6:57 PM  Result Value Ref Range   FIO2 0.21    Delivery systems ROOM AIR    pH, Arterial 7.255 (L) 7.350 - 7.450   pCO2 arterial 28.5 (L) 32.0 - 48.0 mmHg   pO2,  Arterial 104 83.0 - 108.0 mmHg  Bicarbonate 12.2 (L) 20.0 - 28.0 mmol/L   Acid-base deficit 13.5 (H) 0.0 - 2.0 mmol/L   Collection site LEFT RADIAL    Drawn by [phone]    Sample type ARTERIAL    Allens test (pass/fail) PASS PASS  Glucose, capillary     Status: Abnormal   Collection Time: 12/23/16  8:02 PM  Result Value Ref Range   Glucose-Capillary 256 (H) 65 - 99 mg/dL  Glucose, capillary     Status: Abnormal   Collection Time: 12/23/16 10:09 PM  Result Value Ref Range   Glucose-Capillary 193 (H) 65 - 99 mg/dL   US Abdomen Limited Ruq  Result Date: 12/23/2016 CLINICAL DATA:  Bilateral upper quadrant pain with nausea and vomiting for 3 days EXAM: ULTRASOUND ABDOMEN LIMITED RIGHT UPPER QUADRANT COMPARISON:  09/07/2014 FINDINGS: Gallbladder: No gallstones or wall thickening visualized. No sonographic Murphy sign noted by sonographer. Common bile duct: Diameter: 2 mm Liver: No focal lesion identified. Appears with decreased echogenicity. Portal vein is patent on color Doppler imaging with normal direction of blood flow towards the liver. IMPRESSION: 1. Negative for gallstones or biliary dilatation 2. Liver appears hypoechoic, consider hepatitis. Suggest correlation with laboratory values. Electronically Signed   By: Jasmine Pang M.D.   On: 12/23/2016 20:00   MAU Course  Procedures NS 1 L Zofran Pepcid Dilaudid Insulin 10 U, 5 U Oxycodone   MDM Labs ordered and reviewed. Likely DKA. Will obtain ABG and RUQ [place]. Consult with Dr. Erin Fulling. Plan for transfer to [facility] Discussed plan with pt, pt is adement she cannot be admitted tonight d/t no childcare. I explained to her that her status is unstable and could lead to coma and even death. Pt verbalizes understanding but will have to leave AMA. Transfer of care given to H.Mathews Robinsons CNM Donette Larry, CNM  12/23/2016 10:15 PM   2353: Patient has had 4L of NS, and insulin. Blood sugar has come down. Patient again advised that we recommend  transfer to [facility] for further management, and care. She refuses. She has signed the [facility] paper, and will leave now.   Results for orders placed or performed during the hospital encounter of 12/23/16 (from the past 24 hour(s))  Urinalysis, Routine w reflex microscopic     Status: Abnormal   Collection Time: 12/23/16  4:35 PM  Result Value Ref Range   Color, Urine STRAW (A) YELLOW   APPearance CLEAR CLEAR   Specific Gravity, Urine 1.027 1.005 - 1.030   pH 5.0 5.0 - 8.0   Glucose, UA >=500 (A) NEGATIVE mg/dL   Hgb urine dipstick MODERATE (A) NEGATIVE   Bilirubin Urine NEGATIVE NEGATIVE   Ketones, ur 80 (A) NEGATIVE mg/dL   Protein, ur 409 (A) NEGATIVE mg/dL   Nitrite NEGATIVE NEGATIVE   Leukocytes, UA NEGATIVE NEGATIVE   RBC / HPF 0-5 0 - 5 RBC/hpf   WBC, UA 0-5 0 - 5 WBC/hpf   Bacteria, UA NONE SEEN NONE SEEN   Squamous Epithelial / LPF 0-5 (A) NONE SEEN  Pregnancy, urine POC     Status: None   Collection Time: 12/23/16  4:49 PM  Result Value Ref Range   Preg Test, Ur NEGATIVE NEGATIVE  Lipase, blood     Status: None   Collection Time: 12/23/16  5:40 PM  Result Value Ref Range   Lipase 23 11 - 51 U/L  Comprehensive metabolic panel     Status: Abnormal   Collection Time: 12/23/16  5:40 PM  Result Value Ref Range  Sodium 136 135 - 145 mmol/L   Potassium 4.0 3.5 - 5.1 mmol/L   Chloride 100 (L) 101 - 111 mmol/L   CO2 13 (L) 22 - 32 mmol/L   Glucose, Bld 380 (H) 65 - 99 mg/dL   BUN 15 6 - 20 mg/dL   Creatinine, Ser 1.61 0.44 - 1.00 mg/dL   Calcium 9.5 8.9 - 09.6 mg/dL   Total Protein 8.6 (H) 6.5 - 8.1 g/dL   Albumin 4.7 3.5 - 5.0 g/dL   AST 24 15 - 41 U/L   ALT 86 (H) 14 - 54 U/L   Alkaline Phosphatase 171 (H) 38 - 126 U/L   Total Bilirubin 1.0 0.3 - 1.2 mg/dL   GFR calc non Af Amer >60 >60 mL/min   GFR calc Af Amer >60 >60 mL/min   Anion gap 23 (H) 5 - 15  CBC with Differential/Platelet     Status: Abnormal   Collection Time: 12/23/16  5:40 PM  Result Value Ref Range   WBC  18.9 (H) 4.0 - 10.5 K/uL   RBC 4.89 3.87 - 5.11 MIL/uL   Hemoglobin 15.7 (H) 12.0 - 15.0 g/dL   HCT 04.5 40.9 - 81.1 %   MCV 93.3 78.0 - 100.0 fL   MCH 32.1 26.0 - 34.0 pg   MCHC 34.4 30.0 - 36.0 g/dL   RDW 91.4 78.2 - 95.6 %   Platelets 383 150 - 400 K/uL   Neutrophils Relative % 94 %   Neutro Abs 17.7 (H) 1.7 - 7.7 K/uL   Lymphocytes Relative 5 %   Lymphs Abs 1.0 0.7 - 4.0 K/uL   Monocytes Relative 1 %   Monocytes Absolute 0.2 0.1 - 1.0 K/uL   Eosinophils Relative 0 %   Eosinophils Absolute 0.0 0.0 - 0.7 K/uL   Basophils Relative 0 %   Basophils Absolute 0.0 0.0 - 0.1 K/uL  Blood gas, arterial     Status: Abnormal   Collection Time: 12/23/16  6:57 PM  Result Value Ref Range   FIO2 0.21    Delivery systems ROOM AIR    pH, Arterial 7.255 (L) 7.350 - 7.450   pCO2 arterial 28.5 (L) 32.0 - 48.0 mmHg   pO2, Arterial 104 83.0 - 108.0 mmHg   Bicarbonate 12.2 (L) 20.0 - 28.0 mmol/L   Acid-base deficit 13.5 (H) 0.0 - 2.0 mmol/L   Collection site LEFT RADIAL    Drawn by [phone]    Sample type ARTERIAL    Allens test (pass/fail) PASS PASS  Glucose, capillary     Status: Abnormal   Collection Time: 12/23/16  8:02 PM  Result Value Ref Range   Glucose-Capillary 256 (H) 65 - 99 mg/dL  Glucose, capillary     Status: Abnormal   Collection Time: 12/23/16 10:09 PM  Result Value Ref Range   Glucose-Capillary 193 (H) 65 - 99 mg/dL  Comprehensive metabolic panel     Status: Abnormal   Collection Time: 12/23/16 11:08 PM  Result Value Ref Range   Sodium 141 135 - 145 mmol/L   Potassium 4.3 3.5 - 5.1 mmol/L   Chloride 112 (H) 101 - 111 mmol/L   CO2 17 (L) 22 - 32 mmol/L   Glucose, Bld 180 (H) 65 - 99 mg/dL   BUN 12 6 - 20 mg/dL   Creatinine, Ser 6.57 0.44 - 1.00 mg/dL   Calcium 8.1 (L) 8.9 - 10.3 mg/dL   Total Protein 7.1 6.5 - 8.1 g/dL   Albumin 3.7 3.5 -  5.0 g/dL   AST 24 15 - 41 U/L   ALT 66 (H) 14 - 54 U/L   Alkaline Phosphatase 130 (H) 38 - 126 U/L   Total Bilirubin 0.9 0.3 - 1.2  mg/dL   GFR calc non Af Amer >60 >60 mL/min   GFR calc Af Amer >60 >60 mL/min   Anion gap 12 5 - 15     Assessment and Plan   1. Diabetic ketoacidosis without coma associated with type 2 diabetes mellitus (HCC)   2. Abdominal pain, bilateral upper quadrant    Patient has left AMA Advised that if she continues to feel unwell she needs to go to [facility] or [facility] ED ASAP  Thressa Sheller 11:59 PM 12/23/16

## 2016-12-23 NOTE — MAU Note (Signed)
Pt reports nausea, vomiting and upper abd pain x 3 days. Denies fever, denies dysuria

## 2016-12-24 ENCOUNTER — Encounter (HOSPITAL_COMMUNITY): Payer: Self-pay | Admitting: *Deleted

## 2016-12-24 ENCOUNTER — Emergency Department (HOSPITAL_COMMUNITY)
Admission: EM | Admit: 2016-12-24 | Discharge: 2016-12-24 | Discharge status: Against Medical Advice | Payer: Medicaid Other | Attending: Emergency Medicine | Admitting: Emergency Medicine

## 2016-12-24 ENCOUNTER — Encounter (HOSPITAL_COMMUNITY): Payer: Self-pay | Admitting: Family Medicine

## 2016-12-24 ENCOUNTER — Inpatient Hospital Stay (HOSPITAL_COMMUNITY)
Admission: AD | Admit: 2016-12-24 | Discharge: 2016-12-28 | DRG: 638 | Disposition: A | Discharge status: Home / Self Care | Payer: Self-pay | Source: Ambulatory Visit | Attending: Family Medicine | Admitting: Family Medicine

## 2016-12-24 DIAGNOSIS — R739 Hyperglycemia, unspecified: Secondary | ICD-10-CM

## 2016-12-24 DIAGNOSIS — Z6826 Body mass index (BMI) 26.0-26.9, adult: Secondary | ICD-10-CM

## 2016-12-24 DIAGNOSIS — E1159 Type 2 diabetes mellitus with other circulatory complications: Secondary | ICD-10-CM | POA: Diagnosis present

## 2016-12-24 DIAGNOSIS — K2980 Duodenitis without bleeding: Secondary | ICD-10-CM | POA: Diagnosis present

## 2016-12-24 DIAGNOSIS — K3184 Gastroparesis: Secondary | ICD-10-CM | POA: Diagnosis present

## 2016-12-24 DIAGNOSIS — E1165 Type 2 diabetes mellitus with hyperglycemia: Secondary | ICD-10-CM | POA: Diagnosis present

## 2016-12-24 DIAGNOSIS — Z833 Family history of diabetes mellitus: Secondary | ICD-10-CM

## 2016-12-24 DIAGNOSIS — Z841 Family history of disorders of kidney and ureter: Secondary | ICD-10-CM

## 2016-12-24 DIAGNOSIS — E876 Hypokalemia: Secondary | ICD-10-CM | POA: Diagnosis present

## 2016-12-24 DIAGNOSIS — R109 Unspecified abdominal pain: Secondary | ICD-10-CM | POA: Insufficient documentation

## 2016-12-24 DIAGNOSIS — B9681 Helicobacter pylori [H. pylori] as the cause of diseases classified elsewhere: Secondary | ICD-10-CM | POA: Diagnosis present

## 2016-12-24 DIAGNOSIS — E1110 Type 2 diabetes mellitus with ketoacidosis without coma: Principal | ICD-10-CM | POA: Diagnosis present

## 2016-12-24 DIAGNOSIS — E46 Unspecified protein-calorie malnutrition: Secondary | ICD-10-CM | POA: Diagnosis present

## 2016-12-24 DIAGNOSIS — I700 Atherosclerosis of aorta: Secondary | ICD-10-CM | POA: Diagnosis present

## 2016-12-24 DIAGNOSIS — E785 Hyperlipidemia, unspecified: Secondary | ICD-10-CM | POA: Diagnosis present

## 2016-12-24 DIAGNOSIS — R112 Nausea with vomiting, unspecified: Secondary | ICD-10-CM

## 2016-12-24 DIAGNOSIS — R1010 Upper abdominal pain, unspecified: Secondary | ICD-10-CM

## 2016-12-24 DIAGNOSIS — R7401 Elevation of levels of liver transaminase levels: Secondary | ICD-10-CM | POA: Diagnosis present

## 2016-12-24 DIAGNOSIS — K760 Fatty (change of) liver, not elsewhere classified: Secondary | ICD-10-CM | POA: Diagnosis present

## 2016-12-24 DIAGNOSIS — R740 Nonspecific elevation of levels of transaminase and lactic acid dehydrogenase [LDH]: Secondary | ICD-10-CM

## 2016-12-24 DIAGNOSIS — E1143 Type 2 diabetes mellitus with diabetic autonomic (poly)neuropathy: Secondary | ICD-10-CM | POA: Diagnosis present

## 2016-12-24 DIAGNOSIS — I10 Essential (primary) hypertension: Secondary | ICD-10-CM | POA: Diagnosis present

## 2016-12-24 LAB — COMPREHENSIVE METABOLIC PANEL
ALBUMIN: 4.1 g/dL (ref 3.5–5.0)
ALK PHOS: 119 U/L (ref 38–126)
ALT: 52 U/L (ref 14–54)
ANION GAP: 15 (ref 5–15)
AST: 17 U/L (ref 15–41)
BILIRUBIN TOTAL: 1.4 mg/dL — AB (ref 0.3–1.2)
BUN: 10 mg/dL (ref 6–20)
CALCIUM: 9.2 mg/dL (ref 8.9–10.3)
CO2: 16 mmol/L — ABNORMAL LOW (ref 22–32)
CREATININE: 0.6 mg/dL (ref 0.44–1.00)
Chloride: 104 mmol/L (ref 101–111)
GFR calc Af Amer: >60 mL/min (ref >60)
GFR calc non Af Amer: >60 mL/min (ref >60)
GLUCOSE: 259 mg/dL — AB (ref 65–99)
Potassium: 4.1 mmol/L (ref 3.5–5.1)
Sodium: 135 mmol/L (ref 135–145)
TOTAL PROTEIN: 6.8 g/dL (ref 6.5–8.1)

## 2016-12-24 LAB — URINALYSIS, ROUTINE W REFLEX MICROSCOPIC
BILIRUBIN URINE: NEGATIVE
Glucose, UA: >=500 mg/dL — AB
Ketones, ur: 80 mg/dL — AB
NITRITE: NEGATIVE
PH: 5 (ref 5.0–8.0)
Protein, ur: 30 mg/dL — AB
SPECIFIC GRAVITY, URINE: 1.025 (ref 1.005–1.030)

## 2016-12-24 LAB — CBG MONITORING, ED: GLUCOSE-CAPILLARY: 233 mg/dL — AB (ref 65–99)

## 2016-12-24 LAB — ACETAMINOPHEN LEVEL: Acetaminophen (Tylenol), Serum: <10 ug/mL — ABNORMAL LOW (ref 10–30)

## 2016-12-24 LAB — I-STAT VENOUS BLOOD GAS, ED
Acid-base deficit: 10 mmol/L — ABNORMAL HIGH (ref 0.0–2.0)
Bicarbonate: 15.8 mmol/L — ABNORMAL LOW (ref 20.0–28.0)
O2 Saturation: 64 %
PH VEN: 7.258 (ref 7.250–7.430)
TCO2: 17 mmol/L — ABNORMAL LOW (ref 22–32)
pCO2, Ven: 35.4 mmHg — ABNORMAL LOW (ref 44.0–60.0)
pO2, Ven: 38 mmHg (ref 32.0–45.0)

## 2016-12-24 LAB — HCG, SERUM, QUALITATIVE: PREG SERUM: NEGATIVE

## 2016-12-24 LAB — OSMOLALITY: Osmolality: 295 mOsm/kg (ref 275–295)

## 2016-12-24 LAB — HEPATITIS B SURFACE ANTIGEN: Hepatitis B Surface Ag: NEGATIVE

## 2016-12-24 LAB — HEMOGLOBIN A1C
Hgb A1c MFr Bld: 10 % — ABNORMAL HIGH (ref 4.8–5.6)
Mean Plasma Glucose: 240.3 mg/dL

## 2016-12-24 MED ORDER — ONDANSETRON 4 MG PO TBDP
ORAL_TABLET | ORAL | Status: AC
  Filled 2016-12-24: qty 1

## 2016-12-24 MED ORDER — INSULIN GLARGINE 100 UNIT/ML ~~LOC~~ SOLN
10.0000 [IU] | Freq: Once | SUBCUTANEOUS | Status: AC
  Administered 2016-12-24: 10 [IU] via SUBCUTANEOUS
  Filled 2016-12-24: qty 0.1

## 2016-12-24 MED ORDER — HYDROMORPHONE HCL 1 MG/ML IJ SOLN
1.0000 mg | Freq: Once | INTRAMUSCULAR | Status: AC
  Administered 2016-12-24: 1 mg via INTRAVENOUS
  Filled 2016-12-24: qty 1

## 2016-12-24 MED ORDER — ONDANSETRON HCL 4 MG/2ML IJ SOLN
4.0000 mg | Freq: Once | INTRAMUSCULAR | Status: AC
  Administered 2016-12-24: 4 mg via INTRAVENOUS
  Filled 2016-12-24: qty 2

## 2016-12-24 MED ORDER — SODIUM CHLORIDE 0.9 % IV SOLN
INTRAVENOUS | Status: DC
  Administered 2016-12-24 (×2): via INTRAVENOUS

## 2016-12-24 MED ORDER — ONDANSETRON 4 MG PO TBDP
4.0000 mg | ORAL_TABLET | Freq: Once | ORAL | Status: AC
  Administered 2016-12-24: 4 mg via ORAL

## 2016-12-24 NOTE — ED Notes (Signed)
Called pt x2 with no answer

## 2016-12-24 NOTE — MAU Note (Signed)
Report given to [place], Charity fundraiserN.

## 2016-12-24 NOTE — ED Notes (Signed)
This Rn called the phone number on file, went straight to voicemail

## 2016-12-24 NOTE — ED Notes (Signed)
Pt does not answer when called for room, will communicate with nurse first to call the pt again for room prior to taking pt out of system

## 2016-12-24 NOTE — MAU Provider Note (Signed)
Chief Complaint:  Abdominal Pain and Nausea   First Provider Initiated Contact with Patient 12/24/16 1940      HPI: Cheryl Parker is a 36 y.o. [id number] who presents to maternity admissions reporting upper adominal pain, vomiting for 3 days, and newly diagnosed diabetes.  Was at [facility] ED and left due to long wait.    Labs last night were suggestive of DKA.  Got fluids and insulin and was going to get transferred to [facility] but refused.    Had a RUQ [place] last night suggestive of hepatitis.  .  She denies vaginal itching/burning, urinary symptoms, h/a, dizziness, or fever/chills.    Abdominal Pain  This is a recurrent problem. The current episode started in the past 7 days. The onset quality is gradual. The problem occurs constantly. The problem has been unchanged. The pain is located in the LUQ and RUQ. The pain is at a severity of 8/10. The pain is moderate. Associated symptoms include nausea and vomiting. Pertinent negatives include no constipation, diarrhea, dysuria, fever, headaches or myalgias. Nothing aggravates the pain. The pain is relieved by nothing. She has tried nothing for the symptoms.   RN Note: Pt. Here with c/o top abdominal pain 10/10, x3 days. Nauseous for the pass three days; vomit more than 10 days in the last 24 hours.  Pt. Just came from [facility], left because of a five hour wait. Pt. Unaware of being a diabetic, but was told last night by [facility] that she has diabetes.    Past Medical History: Past Medical History:  Diagnosis Date  . 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 DKA (diabetic ketoacidoses) (HCC) 09/10/2014  . Gestational diabetes   . Headache   . Hypertension   . 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 CESAREAN SECTION    . CESAREAN SECTION N/A 07/05/2012   Procedure: CESAREAN SECTION;  Surgeon: Catalina Antigua, MD;  Location: [facility];  Service: Obstetrics;  Laterality: N/A;  . CESAREAN SECTION N/A 06/25/2015   Procedure: CESAREAN SECTION;  Surgeon: Catalina Antigua, MD;  Location: [facility];  Service: Obstetrics;  Laterality: N/A;    Family History: Family History  Problem Relation Age of Onset  . Diabetes Mother   . Kidney disease Father   . Birth defects Son        unilateral renal agenesis    Social History: Social History  Substance Use Topics  . Smoking status: Former Games developer  . Smokeless tobacco: Never Used  . Alcohol use No    Allergies: No Known Allergies  Meds:  Prescriptions Prior to Admission  Medication Sig Dispense Refill Last Dose  . hydrochlorothiazide (HYDRODIURIL) 25 MG tablet Take 1 tablet (25 mg total) by mouth daily. 30 tablet  3 Taking  . ibuprofen (ADVIL,MOTRIN) 600 MG tablet Take 1 tablet (600 mg total) by mouth every 6 (six) hours as needed for mild pain. 90 tablet 1 Taking  . levonorgestrel-ethinyl estradiol (LUTERA) 0.1-20 MG-MCG tablet Take 1 tablet by mouth daily. 1 Package 11   . metFORMIN (GLUCOPHAGE-XR) 500 MG 24 hr tablet TAKE 4 TABLETS(2000 MG) BY MOUTH DAILY WITH BREAKFAST 360 tablet 1 Taking  . TRADJENTA 5 MG TABS tablet TAKE 1 TABLET(5 MG) BY MOUTH DAILY 30 tablet 0     I have reviewed patient's Past Medical Hx, Surgical Hx, Family Hx, Social Hx, medications and allergies.  ROS:  Review of Systems  Constitutional: Negative for fever.  Gastrointestinal: Positive for abdominal pain, nausea and vomiting. Negative for constipation  and diarrhea.  Genitourinary: Negative for dysuria.  Musculoskeletal: Negative for myalgias.  Neurological: Negative for headaches.   Other systems negative     Physical Exam  Patient Vitals for the past 24 hrs:  BP Temp Temp src Pulse SpO2  12/24/16 1937 (!) 153/73 100 F (37.8 C) Oral (!) 128 99 %   Vitals:   12/24/16 1937 12/24/16 1944 12/24/16 2036  BP: (!) 153/73  129/65  Pulse: (!) 128  (!) 101  Resp:   18  Temp: 100 F (37.8 C)  99.2 F (37.3 C)  TempSrc: Oral  Oral  SpO2: 99% 99% 95%    Constitutional: Well-developed female in no acute distress.  Cardiovascular: normal rate and rhythm Respiratory: normal effort, no distress.  GI: Abd soft, tender in upper abdomen.  Nondistended.  No rebound, No guarding.  Bowel Sounds audible  MS: Extremities nontender, no edema, normal ROM Neurologic: Alert and oriented x 4.   Grossly nonfocal. GU: Neg CVAT. Skin:  Warm and Dry Psych:  Affect appropriate.  PELVIC EXAM: deferred, not indicated   Labs: Results for orders placed or performed during the hospital encounter of 12/24/16 (from the past 24 hour(s))  Urinalysis, Routine w reflex microscopic     Status: Abnormal   Collection Time: 12/24/16  7:15 PM  Result Value Ref Range   Color, Urine YELLOW YELLOW   APPearance HAZY (A) CLEAR   Specific Gravity, Urine 1.025 1.005 - 1.030   pH 5.0 5.0 - 8.0   Glucose, UA >=500 (A) NEGATIVE mg/dL   Hgb urine dipstick SMALL (A) NEGATIVE   Bilirubin Urine NEGATIVE NEGATIVE   Ketones, ur 80 (A) NEGATIVE mg/dL   Protein, ur 30 (A) NEGATIVE mg/dL   Nitrite NEGATIVE NEGATIVE   Leukocytes, UA MODERATE (A) NEGATIVE   RBC / HPF 0-5 0 - 5 RBC/hpf   WBC, UA 0-5 0 - 5 WBC/hpf   Bacteria, UA RARE (A) NONE SEEN   Squamous Epithelial / LPF 0-5 (A) NONE SEEN   Mucus PRESENT   Comprehensive metabolic panel     Status: Abnormal   Collection Time: 12/24/16  7:44 PM  Result Value Ref Range   Sodium 135 135 - 145 mmol/L   Potassium 4.1 3.5 -  5.1 mmol/L   Chloride 104 101 - 111 mmol/L   CO2 16 (L) 22 - 32 mmol/L   Glucose, Bld 259 (H) 65 - 99 mg/dL   BUN 10 6 - 20 mg/dL   Creatinine, Ser 4.09 0.44 - 1.00 mg/dL   Calcium 9.2 8.9 - 81.1 mg/dL   Total Protein 6.8 6.5 - 8.1 g/dL   Albumin 4.1 3.5 - 5.0 g/dL   AST 17 15 - 41 U/L   ALT 52  14 - 54 U/L   Alkaline Phosphatase 119 38 - 126 U/L   Total Bilirubin 1.4 (H) 0.3 - 1.2 mg/dL   GFR calc non Af Amer >60 >60 mL/min   GFR calc Af Amer >60 >60 mL/min   Anion gap 15 5 - 15  hCG, serum, qualitative     Status: None   Collection Time: 12/24/16  7:44 PM  Result Value Ref Range   Preg, Serum NEGATIVE NEGATIVE   Also added HbSAG and Hep C antibody, and serum osmolarity Hemoglobin A1C is pending Also added Acetaminophen level  Imaging:  [place] Abdomen Limited Ruq  Result Date: 12/23/2016 CLINICAL DATA:  Bilateral upper quadrant pain with nausea and vomiting for 3 days EXAM: ULTRASOUND ABDOMEN LIMITED RIGHT UPPER QUADRANT COMPARISON:  09/07/2014 FINDINGS: Gallbladder: No gallstones or wall thickening visualized. No sonographic Murphy sign noted by sonographer. Common bile duct: Diameter: 2 mm Liver: No focal lesion identified. Appears with decreased echogenicity. Portal vein is patent on color Doppler imaging with normal direction of blood flow towards the liver. IMPRESSION: 1. Negative for gallstones or biliary dilatation 2. Liver appears hypoechoic, consider hepatitis. Suggest correlation with laboratory values. Electronically Signed   By: Jasmine PangKim  Fujinaga M.D.   On: 12/23/2016 20:00    MAU Course/MDM: I have ordered labs as follows:  See above. HgA1C and Hep B and C added. Imaging ordered: RUQ US done last nght Results reviewed.   Consult Dr Macon LargeAnyanwu who [facility]   Dr Maryfrances Bunnellanford consulted and agrees to accept as medicine transfer..   Treatments in MAU included NS infusion, Zofran, and Lantus 10units SQ.   Pt stable at time of transfer  Assessment: Upper abdominal  pain ? Hepatitis Newly diagnosed diabetes Slightly elevated bilurubin Normal sodium and potassium  Plan: [facility] inpatient medical observation unit.   Wynelle BourgeoisMarie Jakavion Bilodeau CNM, MSN Certified Nurse-Midwife 12/24/2016 7:40 PM

## 2016-12-24 NOTE — ED Notes (Signed)
Charge Rn aware of  Need for bed

## 2016-12-24 NOTE — ED Triage Notes (Signed)
Pt c/o abd mid upper abd pain onset x 3-4 days, pt reports being seen at [facility] yesterday & got IV fluids, pt reports symptoms occur when she is pregnant, pt denies high blood sugars when she is not pregnant, pt denies taking insulin or DM meds, pt reports neg pregnancy test yesterday, pt reports continual  emesis in the last 24 hrs, pt denies diarrhea, A&O x4

## 2016-12-24 NOTE — Progress Notes (Signed)
[facility] Transfer Accept Note  36 yo F with hx gestational diabetes (states does not have chronic diabetes) and HTN presents with few days N/V and RUQ pain.  [facility], was in DKA (pH 7.25, glucose >300, bicarb 13, AG 23).  Got 3L, 15 units insulin and gap closed.  She left AMA before she could be transferred to [facility].  Of note, also had some transaminitis, had a RUQ US that showed no stones, dilation but did show hypoechoicity/hepatitis.  BP 129/65 (BP Location: Right Arm)   Pulse (!) 101   Temp 99.2 F (37.3 C) (Oral)   Resp 18   SpO2 95%   -Na 135, K 4.1, Cr 0.6 -Bicarb 16, anion gap normal, BG 259 -VBG pH normal -Getting IVF -They will give 10 units glargine and continue fluids and she will be transferred to Med surg bed, OBS satus for sort of new onset diabetes in patient wtihout PCP; also for eval of transaminitis and scleral icterus (hepatitis panel sent)

## 2016-12-24 NOTE — ED Notes (Signed)
[facility] reports [facility], pts acuity increased when acid-base deficit reviewed, CBG to be collected

## 2016-12-24 NOTE — MAU Note (Signed)
Pt. Here with c/o top abdominal pain 10/10, x3 days. Nauseous for the pass three days; vomit more than 10 days in the last 24 hours.  Pt. Just came [facility], left because of a five hour wait. Pt. Unaware of being a diabetic, but was told last night by [facility] that she has diabetes.

## 2016-12-25 ENCOUNTER — Observation Stay (HOSPITAL_COMMUNITY): Payer: Self-pay

## 2016-12-25 ENCOUNTER — Encounter (HOSPITAL_COMMUNITY): Payer: Self-pay | Admitting: *Deleted

## 2016-12-25 DIAGNOSIS — E1110 Type 2 diabetes mellitus with ketoacidosis without coma: Secondary | ICD-10-CM | POA: Diagnosis present

## 2016-12-25 DIAGNOSIS — E1165 Type 2 diabetes mellitus with hyperglycemia: Secondary | ICD-10-CM

## 2016-12-25 LAB — BILIRUBIN, FRACTIONATED(TOT/DIR/INDIR)
Bilirubin, Direct: 0.1 mg/dL (ref 0.1–0.5)
Indirect Bilirubin: 1.3 mg/dL — ABNORMAL HIGH (ref 0.3–0.9)
Total Bilirubin: 1.4 mg/dL — ABNORMAL HIGH (ref 0.3–1.2)

## 2016-12-25 LAB — COMPREHENSIVE METABOLIC PANEL
ALBUMIN: 3.4 g/dL — AB (ref 3.5–5.0)
ALT: 46 U/L (ref 14–54)
AST: 16 U/L (ref 15–41)
Alkaline Phosphatase: 105 U/L (ref 38–126)
Anion gap: 11 (ref 5–15)
BUN: 6 mg/dL (ref 6–20)
CHLORIDE: 110 mmol/L (ref 101–111)
CO2: 16 mmol/L — AB (ref 22–32)
CREATININE: 0.75 mg/dL (ref 0.44–1.00)
Calcium: 8.4 mg/dL — ABNORMAL LOW (ref 8.9–10.3)
GFR calc Af Amer: >60 mL/min (ref >60)
GLUCOSE: 252 mg/dL — AB (ref 65–99)
Potassium: 4.1 mmol/L (ref 3.5–5.1)
Sodium: 137 mmol/L (ref 135–145)
Total Bilirubin: 1.3 mg/dL — ABNORMAL HIGH (ref 0.3–1.2)
Total Protein: 6.4 g/dL — ABNORMAL LOW (ref 6.5–8.1)

## 2016-12-25 LAB — CBC
HCT: 38.3 % (ref 36.0–46.0)
Hemoglobin: 12.6 g/dL (ref 12.0–15.0)
MCH: 30.8 pg (ref 26.0–34.0)
MCHC: 32.9 g/dL (ref 30.0–36.0)
MCV: 93.6 fL (ref 78.0–100.0)
PLATELETS: 364 10*3/uL (ref 150–400)
RBC: 4.09 MIL/uL (ref 3.87–5.11)
RDW: 12.3 % (ref 11.5–15.5)
WBC: 11.8 10*3/uL — AB (ref 4.0–10.5)

## 2016-12-25 LAB — GLUCOSE, CAPILLARY
GLUCOSE-CAPILLARY: 200 mg/dL — AB (ref 65–99)
GLUCOSE-CAPILLARY: 189 mg/dL — AB (ref 65–99)
Glucose-Capillary: 198 mg/dL — ABNORMAL HIGH (ref 65–99)
Glucose-Capillary: 207 mg/dL — ABNORMAL HIGH (ref 65–99)
Glucose-Capillary: 186 mg/dL — ABNORMAL HIGH (ref 65–99)

## 2016-12-25 LAB — HEMOGLOBIN A1C
HEMOGLOBIN A1C: 9.8 % — AB (ref 4.8–5.6)
MEAN PLASMA GLUCOSE: 234.56 mg/dL

## 2016-12-25 LAB — HIV ANTIBODY (ROUTINE TESTING W REFLEX): HIV SCREEN 4TH GENERATION: NONREACTIVE

## 2016-12-25 MED ORDER — PANTOPRAZOLE SODIUM 40 MG IV SOLR
40.0000 mg | Freq: Two times a day (BID) | INTRAVENOUS | Status: DC
  Administered 2016-12-25 – 2016-12-28 (×7): 40 mg via INTRAVENOUS
  Filled 2016-12-25 (×7): qty 40

## 2016-12-25 MED ORDER — IOPAMIDOL (ISOVUE-300) INJECTION 61%
INTRAVENOUS | Status: AC
  Administered 2016-12-25: 100 mL
  Filled 2016-12-25: qty 100

## 2016-12-25 MED ORDER — PROMETHAZINE HCL 25 MG/ML IJ SOLN
25.0000 mg | Freq: Four times a day (QID) | INTRAMUSCULAR | Status: DC | PRN
  Administered 2016-12-25 – 2016-12-26 (×4): 25 mg via INTRAVENOUS
  Filled 2016-12-25 (×4): qty 1

## 2016-12-25 MED ORDER — DIPHENHYDRAMINE HCL 25 MG PO CAPS
25.0000 mg | ORAL_CAPSULE | Freq: Four times a day (QID) | ORAL | Status: DC | PRN
  Administered 2016-12-25: 25 mg via ORAL
  Filled 2016-12-25: qty 1

## 2016-12-25 MED ORDER — SUCRALFATE 1 GM/10ML PO SUSP
1.0000 g | Freq: Three times a day (TID) | ORAL | Status: DC
  Administered 2016-12-25 – 2016-12-28 (×11): 1 g via ORAL
  Filled 2016-12-25 (×11): qty 10

## 2016-12-25 MED ORDER — KETOROLAC TROMETHAMINE 30 MG/ML IJ SOLN
30.0000 mg | Freq: Four times a day (QID) | INTRAMUSCULAR | Status: DC | PRN
  Administered 2016-12-25 – 2016-12-28 (×9): 30 mg via INTRAVENOUS
  Filled 2016-12-25 (×10): qty 1

## 2016-12-25 MED ORDER — INSULIN GLARGINE 100 UNIT/ML ~~LOC~~ SOLN
10.0000 [IU] | Freq: Every day | SUBCUTANEOUS | Status: DC
  Filled 2016-12-25: qty 0.1

## 2016-12-25 MED ORDER — SODIUM CHLORIDE 0.9 % IV SOLN
INTRAVENOUS | Status: DC
  Administered 2016-12-25: 01:00:00 via INTRAVENOUS

## 2016-12-25 MED ORDER — OXYCODONE HCL 5 MG PO TABS
5.0000 mg | ORAL_TABLET | ORAL | Status: DC | PRN
  Administered 2016-12-25 – 2016-12-28 (×8): 5 mg via ORAL
  Filled 2016-12-25 (×11): qty 1

## 2016-12-25 MED ORDER — ENOXAPARIN SODIUM 40 MG/0.4ML ~~LOC~~ SOLN
40.0000 mg | SUBCUTANEOUS | Status: DC
  Administered 2016-12-25: 40 mg via SUBCUTANEOUS
  Filled 2016-12-25: qty 0.4

## 2016-12-25 MED ORDER — INSULIN STARTER KIT- PEN NEEDLES (ENGLISH)
1.0000 | Freq: Once | Status: AC
  Administered 2016-12-25: 1
  Filled 2016-12-25: qty 1

## 2016-12-25 MED ORDER — ONDANSETRON HCL 4 MG/2ML IJ SOLN
4.0000 mg | Freq: Four times a day (QID) | INTRAMUSCULAR | Status: DC | PRN
  Administered 2016-12-25 – 2016-12-27 (×6): 4 mg via INTRAVENOUS
  Filled 2016-12-25 (×6): qty 2

## 2016-12-25 MED ORDER — LIVING WELL WITH DIABETES BOOK
Freq: Once | Status: AC
Start: 2016-12-25 — End: 2016-12-25
  Administered 2016-12-25: 1
  Filled 2016-12-25: qty 1

## 2016-12-25 MED ORDER — ACETAMINOPHEN 650 MG RE SUPP
650.0000 mg | Freq: Four times a day (QID) | RECTAL | Status: DC | PRN
Start: 2016-12-25 — End: 2016-12-28

## 2016-12-25 MED ORDER — INSULIN ASPART 100 UNIT/ML ~~LOC~~ SOLN
0.0000 [IU] | Freq: Four times a day (QID) | SUBCUTANEOUS | Status: DC
  Administered 2016-12-25 – 2016-12-26 (×2): 3 [IU] via SUBCUTANEOUS
  Administered 2016-12-26: 5 [IU] via SUBCUTANEOUS
  Administered 2016-12-26 – 2016-12-27 (×3): 3 [IU] via SUBCUTANEOUS

## 2016-12-25 MED ORDER — ACETAMINOPHEN 325 MG PO TABS: 650.0000 mg | ORAL_TABLET | Freq: Four times a day (QID) | ORAL | Status: DC | PRN

## 2016-12-25 MED ORDER — INSULIN ASPART 100 UNIT/ML ~~LOC~~ SOLN
0.0000 [IU] | Freq: Every day | SUBCUTANEOUS | Status: DC
  Administered 2016-12-25: 2 [IU] via SUBCUTANEOUS

## 2016-12-25 MED ORDER — INSULIN STARTER KIT- SYRINGES (ENGLISH)
1.0000 | Freq: Once | Status: AC
  Administered 2016-12-25: 1
  Filled 2016-12-25: qty 1

## 2016-12-25 MED ORDER — INSULIN GLARGINE 100 UNIT/ML ~~LOC~~ SOLN
20.0000 [IU] | Freq: Every day | SUBCUTANEOUS | Status: DC
  Administered 2016-12-25: 20 [IU] via SUBCUTANEOUS
  Filled 2016-12-25: qty 0.2

## 2016-12-25 MED ORDER — LACTATED RINGERS IV SOLN
INTRAVENOUS | Status: AC
  Administered 2016-12-25: 09:00:00 via INTRAVENOUS

## 2016-12-25 MED ORDER — IOPAMIDOL (ISOVUE-300) INJECTION 61%
INTRAVENOUS | Status: AC
Start: 2016-12-25 — End: 2016-12-25
  Administered 2016-12-25: 30 mL
  Filled 2016-12-25: qty 30

## 2016-12-25 MED ORDER — INSULIN ASPART 100 UNIT/ML ~~LOC~~ SOLN
0.0000 [IU] | Freq: Three times a day (TID) | SUBCUTANEOUS | Status: DC
  Administered 2016-12-25: 3 [IU] via SUBCUTANEOUS

## 2016-12-25 MED ORDER — HYDROCHLOROTHIAZIDE 25 MG PO TABS
25.0000 mg | ORAL_TABLET | Freq: Every day | ORAL | Status: DC
  Administered 2016-12-25 – 2016-12-27 (×3): 25 mg via ORAL
  Filled 2016-12-25 (×3): qty 1

## 2016-12-25 MED ORDER — ALUM & MAG HYDROXIDE-SIMETH 200-200-20 MG/5ML PO SUSP
30.0000 mL | Freq: Three times a day (TID) | ORAL | Status: DC
  Administered 2016-12-25 – 2016-12-28 (×10): 30 mL via ORAL
  Filled 2016-12-25 (×10): qty 30

## 2016-12-25 MED ORDER — ONDANSETRON HCL 4 MG PO TABS
4.0000 mg | ORAL_TABLET | Freq: Four times a day (QID) | ORAL | Status: DC | PRN
Start: 2016-12-25 — End: 2016-12-28
  Administered 2016-12-28: 4 mg via ORAL
  Filled 2016-12-25: qty 1

## 2016-12-25 MED ORDER — INSULIN GLARGINE 100 UNIT/ML ~~LOC~~ SOLN
15.0000 [IU] | Freq: Every day | SUBCUTANEOUS | Status: DC
  Administered 2016-12-26: 15 [IU] via SUBCUTANEOUS
  Filled 2016-12-25 (×2): qty 0.15

## 2016-12-25 NOTE — Progress Notes (Signed)
Patient admitted to [id number] from [facility] with N? and RUQ  pain. Alert and oriented  X4,VS stable, denies pain . Oriented to room and call bell.

## 2016-12-25 NOTE — Progress Notes (Signed)
'@IPLOG' @        PROGRESS NOTE                                                                                                                                                                                                             Patient Demographics:    Cheryl Parker, is a 36 y.o. female, DOB - 1980/08/14, DJS:[id number]  Admit date - 12/24/2016   Admitting Physician Edwin Dada, MD  Outpatient Primary MD for the patient is Patient, No Pcp Per  LOS - 0  Chief Complaint  Patient presents with  . Abdominal Pain  . Nausea       Brief Narrative  Cheryl Parker is a 36 y.o. female with a past medical history significant for gestational diabetes who presents with abdominal pain, nausea and vomiting for 3 days.  The patient was in her usual state of health until about 3-4 days ago when she got "a head cold" which she characterizes primarily as nasal congestion and nausea/vomiting.  For the next two days she could not eat, vomited nonbloody emesis repeatedly and developed epigastric pain, that was constant, severe and sharp or aching in character. Initial workup which included right upper quadrant ultrasound and CT abdomen and pelvis were unremarkable.   Subjective:    Cheryl Parker today has, No headache, No chest pain, +ve nausea and epigastric abdominal pain ,No new weakness tingling or numbness, No Cough - SOB.     Assessment  & Plan :     1.Epigastric abdominal pain of unclear etiology. Initial evaluation with right upper quadrant ultrasound and CT abdomen pelvis are nonacute. She has been placed on bowel rest, IV PPI, I have requested GI physician Dr. Paulita Fujita to evaluate her as I think she might benefit from a EGD. Lipase and urine pregnancy were unremarkable as well.  2. DM type II. East on Lantus and sliding scale, diabetic education, she likely does not have type 1 diabetes mellitus as it will be extremely late in her age cycle for this to present.  Lab Results   Component Value Date   HGBA1C 10.0 (H) 12/24/2016   CBG (last 3)   Recent Labs  12/23/16 2209 12/24/16 1833 12/25/16 0752  GLUCAP 193* 233* 200*        Diet : Diet NPO time specified Except for: Sips with Meds    Family Communication  :  None present  Code Status :  Full code  Disposition Plan  :  In the hospital  Consults  :  GI  Procedures  :    CT abdomen and pelvis. Nonacute  DVT Prophylaxis  :    SCDs   Lab Results  Component Value Date   PLT 364 12/25/2016    Inpatient Medications  Scheduled Meds: . alum & mag hydroxide-simeth  30 mL Oral TID  . hydrochlorothiazide  25 mg Oral Daily  . insulin aspart  0-15 Units Subcutaneous Q6H  . [START ON 12/26/2016] insulin glargine  15 Units Subcutaneous Daily  . insulin starter kit- pen needles  1 kit Other Once  . insulin starter kit- syringes  1 kit Other Once  . pantoprazole (PROTONIX) IV  40 mg Intravenous Q12H   Continuous Infusions: . lactated ringers 75 mL/hr at 12/25/16 0904   PRN Meds:.acetaminophen **OR** acetaminophen, diphenhydrAMINE, ketorolac, ondansetron **OR** ondansetron (ZOFRAN) IV, oxyCODONE, promethazine  Antibiotics  :    Anti-infectives    None         Objective:   Vitals:   12/24/16 1944 12/24/16 2036 12/24/16 2142 12/25/16 0445  BP:  129/65 134/81 122/70  Pulse:  (!) 101 79 86  Resp:  '18 16 17  ' Temp:  99.2 F (37.3 C) 98.3 F (36.8 C) 98.7 F (37.1 C)  TempSrc:  Oral Oral Oral  SpO2: 99% 95% 98% 100%  Weight:   64.4 kg (142 lb)   Height:   '5\' 1"'  (1.549 m)     Wt Readings from Last 3 Encounters:  12/24/16 64.4 kg (142 lb)  12/24/16 64.9 kg (143 lb)  12/23/16 64.4 kg (142 lb)     Intake/Output Summary (Last 24 hours) at 12/25/16 1246 Last data filed at 12/25/16 0600  Gross per 24 hour  Intake             1000 ml  Output              200 ml  Net              800 ml     Physical Exam  Awake Alert, Oriented X 3, No new F.N deficits, Normal  affect [place].AT,PERRAL Supple Neck,No JVD, No cervical lymphadenopathy appriciated.  Symmetrical Chest wall movement, Good air movement bilaterally, CTAB RRR,No Gallops,Rubs or new Murmurs, No Parasternal Heave +ve B.Sounds, Abd Soft, No tenderness, No organomegaly appriciated, No rebound - guarding or rigidity. No Cyanosis, Clubbing or edema, No new Rash or bruise       Data Review:    CBC  Recent Labs Lab 12/23/16 1740 12/25/16 0034  WBC 18.9* 11.8*  HGB 15.7* 12.6  HCT 45.6 38.3  PLT 383 364  MCV 93.3 93.6  MCH 32.1 30.8  MCHC 34.4 32.9  RDW 12.6 12.3  LYMPHSABS 1.0  --   MONOABS 0.2  --   EOSABS 0.0  --   BASOSABS 0.0  --     Chemistries   Recent Labs Lab 12/23/16 1740 12/23/16 2308 12/24/16 1944 12/25/16 0034  NA 136 141 135 137  K 4.0 4.3 4.1 4.1  CL 100* 112* 104 110  CO2 13* 17* 16* 16*  GLUCOSE 380* 180* 259* 252*  BUN '15 12 10 6  ' CREATININE 0.85 0.61 0.60 0.75  CALCIUM 9.5 8.1* 9.2 8.4*  AST '24 24 17 16  ' ALT 86* 66* 52 46  ALKPHOS 171* 130* 119 105  BILITOT 1.0 0.9 1.4* 1.4*  1.3*   ------------------------------------------------------------------------------------------------------------------ No results for input(s): CHOL, HDL, LDLCALC, TRIG, CHOLHDL, LDLDIRECT in the last 72 hours.  Lab Results  Component  Value Date   HGBA1C 10.0 (H) 12/24/2016   ------------------------------------------------------------------------------------------------------------------ No results for input(s): TSH, T4TOTAL, T3FREE, THYROIDAB in the last 72 hours.  Invalid input(s): FREET3 ------------------------------------------------------------------------------------------------------------------ No results for input(s): VITAMINB12, FOLATE, FERRITIN, TIBC, IRON, RETICCTPCT in the last 72 hours.  Coagulation profile No results for input(s): INR, PROTIME in the last 168 hours.  No results for input(s): DDIMER in the last 72 hours.  Cardiac Enzymes No  results for input(s): CKMB, TROPONINI, MYOGLOBIN in the last 168 hours.  Invalid input(s): CK ------------------------------------------------------------------------------------------------------------------ No results found for: BNP  Micro Results No results found for this or any previous visit (from the past 240 hour(s)).  Radiology Reports Ct Abdomen Pelvis W Contrast  Result Date: 12/25/2016 CLINICAL DATA:  Nausea and vomiting for 3 days. Mid abdominal pain. New onset diabetes. Transaminitis. EXAM: CT ABDOMEN AND PELVIS WITH CONTRAST TECHNIQUE: Multidetector CT imaging of the abdomen and pelvis was performed using the standard protocol following bolus administration of intravenous contrast. CONTRAST:  190m ISOVUE-300 IOPAMIDOL (ISOVUE-300) INJECTION 61% COMPARISON:  Right upper quadrant ultrasound 12/23/2016. CT of the abdomen and pelvis 09/07/2014 FINDINGS: Lower chest: The lung bases are clear without focal nodule, mass, or airspace disease. Heart size normal. No significant pleural or pericardial effusion is present. Hepatobiliary: There is focal fatty infiltration along the falciform ligament. No other focal lesions are present. The liver is otherwise unremarkable. The common bile duct and gallbladder are within normal limits. Pancreas: Unremarkable. No pancreatic ductal dilatation or surrounding inflammatory changes. Spleen: Normal in size without focal abnormality. Adrenals/Urinary Tract: The adrenal glands are normal bilaterally. Kidneys and ureters are within normal limits. The urinary bladder is within normal limits. Stomach/Bowel: The stomach and duodenum are within normal limits. The small bowel is unremarkable. The appendix is visualized and normal. The ascending and transverse colon are within normal limits. The descending and sigmoid colon are unremarkable. Vascular/Lymphatic: Atherosclerotic changes are present in the distal aorta without aneurysm or significant stenosis. No  significant adenopathy is present. Reproductive: Uterus and bilateral adnexa are unremarkable. Other: No abdominal wall hernia or abnormality. No abdominopelvic ascites. Musculoskeletal: Vertebral body heights and alignment are maintained. No focal lytic or blastic lesions are present. The bony pelvis is within normal limits. The hips are unremarkable. IMPRESSION: 1. No acute or focal lesion to explain the patient's abdominal pain, nausea, or vomiting. 2. Aortic Atherosclerosis (ICD10-I70.0). Changes of atherosclerosis within the distal aorta without aneurysm or focal stenosis. Electronically Signed   By: CSan MorelleM.D.   On: 12/25/2016 09:30   [place]domen Limited Ruq  Result Date: 12/23/2016 CLINICAL DATA:  Bilateral upper quadrant pain with nausea and vomiting for 3 days EXAM: ULTRASOUND ABDOMEN LIMITED RIGHT UPPER QUADRANT COMPARISON:  09/07/2014 FINDINGS: Gallbladder: No gallstones or wall thickening visualized. No sonographic Murphy sign noted by sonographer. Common bile duct: Diameter: 2 mm Liver: No focal lesion identified. Appears with decreased echogenicity. Portal vein is patent on color Doppler imaging with normal direction of blood flow towards the liver. IMPRESSION: 1. Negative for gallstones or biliary dilatation 2. Liver appears hypoechoic, consider hepatitis. Suggest correlation with laboratory values. Electronically Signed   By: KDonavan FoilM.D.   On: 12/23/2016 20:00    Time Spent in minutes  30   PLala LundM.D on 12/25/2016 at 12:46 PM  Between 7am to 7pm - Pager - [phone]( page via [facility], text pages only, please mention full 10 digit call back number). After 7pm go to [email or web address] - password [facility]

## 2016-12-25 NOTE — Consult Note (Signed)
[facility] Consultation Note  Referring Provider: Dr. Susa Raring Primary Care Physician:  Patient, No Pcp Per Primary Gastroenterologist:  None  Reason for Consultation:  Abdominal pain  HPI: Cheryl Parker is a 36 y.o. female whom I've been asked to see for abdominal pain.  Admitted for DKA and abdominal pain.  Her abdominal pain has been ongoing, near constant, for several days, narcotics are the only thing that help, ill-defined and poorly localized without clear alleviating or exacerbating features.  Associated symptoms include nausea and vomiting.  No blood in stools.  Labs and CT scan and ultrasound unrevealing.   Past Medical History:  Diagnosis Date  . 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 DKA (diabetic ketoacidoses) (HCC) 09/10/2014  . Gestational diabetes   . Headache   . Hypertension   . Pregnancy induced hypertension     Past Surgical History:  Procedure Laterality Date  . CESAREAN SECTION    . CESAREAN SECTION N/A 07/05/2012   Procedure: CESAREAN SECTION;  Surgeon: Catalina Antigua, MD;  Location: [facility];  Service: Obstetrics;  Laterality: N/A;  . CESAREAN SECTION N/A 06/25/2015   Procedure: CESAREAN SECTION;  Surgeon: Catalina Antigua, MD;  Location: [facility];  Service: Obstetrics;  Laterality: N/A;    Prior to Admission medications   Medication Sig Start Date End Date Taking? Authorizing Provider  acetaminophen (TYLENOL) 500 MG tablet Take 1,000 mg by mouth every 6 (six) hours as needed for moderate pain.   Yes [provider]  hydrochlorothiazide (HYDRODIURIL) 25 MG tablet Take 1 tablet (25 mg total) by mouth daily. Patient not taking: Reported on 12/24/2016 07/10/15   Adam Phenix, MD  metFORMIN (GLUCOPHAGE-XR) 500 MG 24 hr tablet TAKE 4 TABLETS(2000 MG) BY MOUTH DAILY WITH BREAKFAST Patient not taking: Reported on 12/24/2016 06/27/15   Kathrynn Running, MD  TRADJENTA 5 MG TABS tablet TAKE 1 TABLET(5 MG) BY MOUTH  DAILY Patient not taking: Reported on 12/24/2016 08/05/15   Wouk, Wilfred Curtis, MD    Current Facility-Administered Medications  Medication Dose Route Frequency Provider Last Rate Last Dose  . acetaminophen (TYLENOL) tablet 650 mg  650 mg Oral Q6H PRN Danford, Earl Lites, MD       Or  . acetaminophen (TYLENOL) suppository 650 mg  650 mg Rectal Q6H PRN Danford, Earl Lites, MD      . alum & mag hydroxide-simeth (MAALOX/MYLANTA) 200-200-20 MG/5ML suspension 30 mL  30 mL Oral TID Leroy Sea, MD   30 mL at 12/25/16 0949  . diphenhydrAMINE (BENADRYL) capsule 25 mg  25 mg Oral Q6H PRN Audrea Muscat T, NP   25 mg at 12/25/16 0549  . hydrochlorothiazide (HYDRODIURIL) tablet 25 mg  25 mg Oral Daily Alberteen Sam, MD   25 mg at 12/25/16 1502  . insulin aspart (novoLOG) injection 0-15 Units  0-15 Units Subcutaneous Q6H Leroy Sea, MD      . Melene Muller ON 12/26/2016] insulin glargine (LANTUS) injection 15 Units  15 Units Subcutaneous Daily Susa Raring K, MD      . ketorolac (TORADOL) 30 MG/ML injection 30 mg  30 mg Intravenous Q6H PRN Alberteen Sam, MD   30 mg at 12/25/16 1457  . lactated ringers infusion   Intravenous Continuous Leroy Sea, MD 75 mL/hr at 12/25/16 0904    . ondansetron (ZOFRAN) tablet 4 mg  4 mg Oral Q6H PRN Danford, Earl Lites, MD       Or  . ondansetron (  ZOFRAN) injection 4 mg  4 mg Intravenous Q6H PRN Alberteen Samanford, Christopher P, MD   4 mg at 12/25/16 1457  . oxyCODONE (Oxy IR/ROXICODONE) immediate release tablet 5 mg  5 mg Oral Q4H PRN Alberteen Samanford, Christopher P, MD   5 mg at 12/25/16 16100712  . pantoprazole (PROTONIX) injection 40 mg  40 mg Intravenous Q12H Leroy SeaSingh, Prashant K, MD   40 mg at 12/25/16 1245  . promethazine (PHENERGAN) injection 25 mg  25 mg Intravenous Q6H PRN Leroy SeaSingh, Prashant K, MD   25 mg at 12/25/16 0950    Allergies as of 12/24/2016  . (No Known Allergies)    Family History  Problem Relation Age of Onset  . Diabetes Mother   .  Kidney disease Father   . Birth defects Son        unilateral renal agenesis    Social History   Social History  . Marital status: Single    Spouse name: N/A  . Number of children: N/A  . Years of education: N/A   Occupational History  . Not on file.   Social History Main Topics  . Smoking status: Former Games developermoker  . Smokeless tobacco: Never Used  . Alcohol use No  . Drug use: No  . Sexual activity: Yes   Other Topics Concern  . Not on file   Social History Narrative  . No narrative on file    Review of Systems: As per HPI all others negative  Physical Exam: Vital signs in last 24 hours: Temp:  [98.3 F (36.8 C)-100 F (37.8 C)] 98.6 F (37 C) (09/01 1410) Pulse Rate:  [79-128] 85 (09/01 1410) Resp:  [14-18] 15 (09/01 1410) BP: (122-154)/(65-104) 154/81 (09/01 1410) SpO2:  [95 %-100 %] 100 % (09/01 1410) Weight:  [142 lb (64.4 kg)-143 lb (64.9 kg)] 142 lb (64.4 kg) (08/31 2142)   General:   Alert,  Overweight,  NAD Head:  Normocephalic and atraumatic. Eyes:  Sclera clear, no icterus.   Conjunctiva pink. Ears:  Normal auditory acuity. Nose:  No deformity, discharge,  or lesions. Mouth:  No deformity or lesions.  Oropharynx pink & moist. Neck:  Supple; no masses or thyromegaly. Lungs:  Clear throughout to auscultation.   No wheezes, crackles, or rhonchi. No acute distress. Heart:  Regular rate and rhythm; no murmurs, clicks, rubs,  or gallops. Abdomen:  Soft, protuberant, mild generalized tenderness without peritonitis; No masses, hepatosplenomegaly or hernias noted. Normal bowel sounds, without guarding, and without rebound.     Msk:  Symmetrical without gross deformities. Normal posture. Pulses:  Normal pulses noted. Extremities:  Without clubbing or edema. Neurologic:  Alert and  oriented x4;  grossly normal neurologically. Skin:  Intact without significant lesions or rashes. Cervical Nodes:  No significant cervical adenopathy. Psych:  Alert and cooperative.  Normal mood and affect.   Lab Results:  Recent Labs  12/23/16 1740 12/25/16 0034  WBC 18.9* 11.8*  HGB 15.7* 12.6  HCT 45.6 38.3  PLT 383 364   BMET  Recent Labs  12/23/16 2308 12/24/16 1944 12/25/16 0034  NA 141 135 137  K 4.3 4.1 4.1  CL 112* 104 110  CO2 17* 16* 16*  GLUCOSE 180* 259* 252*  BUN 12 10 6   CREATININE 0.61 0.60 0.75  CALCIUM 8.1* 9.2 8.4*   LFT  Recent Labs  12/25/16 0034  PROT 6.4*  ALBUMIN 3.4*  AST 16  ALT 46  ALKPHOS 105  BILITOT 1.4*  1.3*  BILIDIR 0.1  IBILI  1.3*   PT/INR No results for input(s): LABPROT, INR in the last 72 hours.  Studies/Results: Ct Abdomen Pelvis W Contrast  Result Date: 12/25/2016 CLINICAL DATA:  Nausea and vomiting for 3 days. Mid abdominal pain. New onset diabetes. Transaminitis. EXAM: CT ABDOMEN AND PELVIS WITH CONTRAST TECHNIQUE: Multidetector CT imaging of the abdomen and pelvis was performed using the standard protocol following bolus administration of intravenous contrast. CONTRAST:  ISOVUE-300 IOPAMIDOL (ISOVUE-300) INJECTION 61% COMPARISON:  Right upper quadrant ultrasound 12/23/2016. CT of the abdomen and pelvis 09/07/2014 FINDINGS: Lower chest: The lung bases are clear without focal nodule, mass, or airspace disease. Heart size normal. No significant pleural or pericardial effusion is present. Hepatobiliary: There is focal fatty infiltration along the falciform ligament. No other focal lesions are present. The liver is otherwise unremarkable. The common bile duct and gallbladder are within normal limits. Pancreas: Unremarkable. No pancreatic ductal dilatation or surrounding inflammatory changes. Spleen: Normal in size without focal abnormality. Adrenals/Urinary Tract: The adrenal glands are normal bilaterally. Kidneys and ureters are within normal limits. The urinary bladder is within normal limits. Stomach/Bowel: The stomach and duodenum are within normal limits. The small bowel is unremarkable. The  appendix is visualized and normal. The ascending and transverse colon are within normal limits. The descending and sigmoid colon are unremarkable. Vascular/Lymphatic: Atherosclerotic changes are present in the distal aorta without aneurysm or significant stenosis. No significant adenopathy is present. Reproductive: Uterus and bilateral adnexa are unremarkable. Other: No abdominal wall hernia or abnormality. No abdominopelvic ascites. Musculoskeletal: Vertebral body heights and alignment are maintained. No focal lytic or blastic lesions are present. The bony pelvis is within normal limits. The hips are unremarkable. IMPRESSION: 1. No acute or focal lesion to explain the patient's abdominal pain, nausea, or vomiting. 2. Aortic Atherosclerosis (ICD10-I70.0). Changes of atherosclerosis within the distal aorta without aneurysm or focal stenosis. Electronically Signed   By: Marin Roberts M.D.   On: 12/25/2016 09:30   US Abdomen Limited Ruq  Result Date: 12/23/2016 CLINICAL DATA:  Bilateral upper quadrant pain with nausea and vomiting for 3 days EXAM: ULTRASOUND ABDOMEN LIMITED RIGHT UPPER QUADRANT COMPARISON:  09/07/2014 FINDINGS: Gallbladder: No gallstones or wall thickening visualized. No sonographic Murphy sign noted by sonographer. Common bile duct: Diameter: 2 mm Liver: No focal lesion identified. Appears with decreased echogenicity. Portal vein is patent on color Doppler imaging with normal direction of blood flow towards the liver. IMPRESSION: 1. Negative for gallstones or biliary dilatation 2. Liver appears hypoechoic, consider hepatitis. Suggest correlation with laboratory values. Electronically Signed   By: Jasmine Pang M.D.   On: 12/23/2016 20:00   Impression:  1.  Abdominal pain, generalized, with benign abdominal exam, negative imaging and labs.  DKA can cause significant abdominal pain, and is a consideration, as is diabetic gastroparesis.  Plan:  1.  Supportive management and treatment  of DKA. 2.  Sucralfate and PPI. 3.  Don't think endoscopy will help, but will consider if symptoms persist despite escalation of medical therapy.  Could also consider CT angiogram if symptoms don't improve, though I'd expect some positive findings on CT should patient have mesenteric ischemia since her symptoms have been ongoing for several days.  4.  Will follow.   LOS: 0 days   Alexandar Weisenberger M  12/25/2016, 6:07 PM  Cell [phone] If no answer or after 5 PM call [phone]

## 2016-12-25 NOTE — Progress Notes (Signed)
Obtained all educational diabetic guides for pt at bedside, pt has not felt well today but will start education tomorrow.

## 2016-12-25 NOTE — Progress Notes (Signed)
Nutrition Brief Note  Patient was not feeling well during my visit, nauseous, dry heaving, but unable to vomit anything. Patient declined education at this time. Left Type 2 Diabetes Nutrition Therapy handout by [employer] with patient and informed her a dietitian would return if she is still here Monday (there is no coverage on sundays.)  Dionne AnoWilliam M. Rosamund Nyland, MS, RD LDN Inpatient Clinical Dietitian Pager [phone]

## 2016-12-25 NOTE — Progress Notes (Signed)
Inpatient Diabetes Program Recommendations  AACE/ADA: New Consensus Statement on Inpatient Glycemic Control (2015)  Target Ranges:  Prepandial:   less than 140 mg/dL      Peak postprandial:   less than 180 mg/dL (1-2 hours)      Critically ill patients:  140 - 180 mg/dL   Lab Results  Component Value Date   GLUCAP 200 (H) 12/25/2016   HGBA1C 10.0 (H) 12/24/2016    Review of Glycemic Control Results for Cheryl Parker, Cheryl Parker (MRN [id number]) as of 12/25/2016 09:07  Ref. Range 12/23/2016 20:02 12/23/2016 22:09 12/24/2016 18:33 12/25/2016 07:52  Glucose-Capillary Latest Ref Range: 65 - 99 mg/dL 409256 (H) 811193 (H) 914233 (H) 200 (H)   Diabetes history: GD Outpatient Diabetes medications: None Current orders for Inpatient glycemic control: Lantus 20 units qd + Novolog correction moderate scale  Inpatient Diabetes Program Recommendations:  Noted patient admitted with DKA with blood glucose 380. C-peptide was only 1.0 09/11/14. Please consider C-peptide to evaluate patient for possible type 1. Noted no insurance listed in chart. Placed case management referral and patient may consider transitioning to Novolin 70/30 insulin by syringe for affordability as needed. [facility] has Novolin 70/30 for approx. $25 per vial. Will follow.  Thank you, Billy FischerJudy E. Ikenna Ohms, RN, MSN, CDE  Diabetes Coordinator Inpatient Glycemic Control Team Team Pager [phone] (8am-5pm) 12/25/2016 9:21 AM

## 2016-12-25 NOTE — H&P (Signed)
History and Physical  Patient Name: Cheryl Parker     ZOX:[id number]    DOB: 05/28/1980    DOA: 12/24/2016 PCP: Patient, No Pcp Per  [facility]  Chief Complaint: Nausea vomiting      HPI: Cheryl Parker is a 36 y.o. female with a past medical history significant for gestational diabetes who presents with abdominal pain, nausea and vomiting for 3 days.  The patient was in her usual state of health until about 3-4 days ago when she got "a head cold" which she characterizes primarily as nasal congestion and nausea/vomiting.  For the next two days she could not eat, vomited nonbloody emesis repeatedly and developed epigastric pain, that was constant, severe and sharp or aching in character.  Finally, [facility] because she thought she was pregnant, but there pregnancy test was negative.  Instead she was found to have glucose >300, pH 7.25 on ABG, bicarb 13, and anion gap 23. She was given 3L fluids, 15 units subQ insulin and her gap was closed on repeat BMP.  [facility] was being [facility].  Of note, had some transaminitis at that time, and an abdomen US showed no gallstones, no dilated CBD or hepatic ducts, and no cholecystitis, but did show hypoechogenicity of the liver consistent with hepatitis.  The patient has had GDM with all three pregnancies, but only thought she had GDM and has no PCP nor ever been diagnosed with non-gestational diabetes. [facility]  After leaving AMA last night, her abdominal pain continued, and she was persistently nauseated, vomiting repeatedly, including a few streaks of blood after hard retching.  Tonight because her symptoms were no better, [facility] left because the wait was >5 hrs, and went back to the MAU.  MAU course: -Afebrile, heart rate 101, respirations and pulse ox normal, BP 129/65 -Na 135, K 4.1, Cr 0.6 -Bicarb  16, anion gap normal, BG 259 -VBG pH normal -AST/ALT normal, Tbili 1.4 -She was given more IV fluids, and 10 units Glargine and TRH were asked to accept for new onset diabetes, with resolving DKA and transaminitis   She does not use alcohol.  She does not use IV drugs.  She has been using only rare acetaminophen (last on Monday).  Over the last months or year or so, she has had occasional blurry vision, occasional polydipsia.      ROS: Review of Systems  Constitutional: Positive for malaise/fatigue. Negative for fever.  Eyes: Positive for blurred vision.  Respiratory: Negative for cough, sputum production and shortness of breath.   Cardiovascular: Negative for chest pain.  Gastrointestinal: Positive for abdominal pain, nausea and vomiting. Negative for blood in stool and melena.  Genitourinary: Negative for dysuria, flank pain, frequency, hematuria and urgency.  Endo/Heme/Allergies: Positive for polydipsia.  All other systems reviewed and are negative.         Past Medical History:  Diagnosis Date  . 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 DKA (diabetic ketoacidoses) (HCC) 09/10/2014  . Gestational diabetes   . Headache   . Hypertension   . Pregnancy induced hypertension     Past Surgical History:  Procedure Laterality Date  . CESAREAN SECTION    . CESAREAN SECTION N/A 07/05/2012   Procedure: CESAREAN SECTION;  Surgeon: Catalina AntiguaPeggy Constant, MD;  Location: [facility];  Service: Obstetrics;  Laterality:  N/A;  . CESAREAN SECTION N/A 06/25/2015   Procedure: CESAREAN SECTION;  Surgeon: Catalina Antigua, MD;  Location: [facility];  Service: Obstetrics;  Laterality: N/A;    Social History: Patient lives with her fiance and children.  The patient walks unassisted.  She does not work.  Nonsmoker.  No alcohol or illegal drugs.  No Known Allergies  Family history: family history includes Birth defects in her son; Diabetes in her mother; Kidney disease in her  father.  Prior to Admission medications   Medication Sig Start Date End Date Taking? Authorizing Provider  acetaminophen (TYLENOL) 500 MG tablet Take 1,000 mg by mouth every 6 (six) hours as needed for moderate pain.   Yes [provider]  hydrochlorothiazide (HYDRODIURIL) 25 MG tablet Take 1 tablet (25 mg total) by mouth daily. Patient not taking: Reported on 12/24/2016 07/10/15   Adam Phenix, MD  metFORMIN (GLUCOPHAGE-XR) 500 MG 24 hr tablet TAKE 4 TABLETS(2000 MG) BY MOUTH DAILY WITH BREAKFAST Patient not taking: Reported on 12/24/2016 06/27/15   Wouk, Wilfred Curtis, MD  TRADJENTA 5 MG TABS tablet TAKE 1 TABLET(5 MG) BY MOUTH DAILY Patient not taking: Reported on 12/24/2016 08/05/15   Wouk, Wilfred Curtis, MD       Physical Exam: BP 134/81 (BP Location: Right Arm)   Pulse 79   Temp 98.3 F (36.8 C) (Oral)   Resp 16   Ht 5\' 1"  (1.549 m)   Wt 64.4 kg (142 lb)   SpO2 98%   BMI 26.83 kg/m  General appearance: Well-developed, adult female, alert and in mild distress from abdominal pain.   Eyes: Anicteric, conjunctiva pink, lids and lashes normal. PERRL.    ENT: No nasal deformity, discharge, epistaxis.  Hearing normal. OP moist without lesions except one tiny red macule on posterior pharynx.   Neck: No neck masses.  Trachea midline.  No thyromegaly/tenderness. Lymph: No cervical or supraclavicular lymphadenopathy. Skin: Warm and dry.  No jaundice.  No suspicious rashes or lesions. Cardiac: Tachycardic, regular, nl S1-S2, no murmurs appreciated.  Capillary refill is brisk.  JVP normale.  No LE edema.  Radial and DP pulses 2+ and symmetric. Respiratory: Normal respiratory rate and rhythm.  CTAB without rales or wheezes. Abdomen: Abdomen soft.  Mild epigastric TTP without guarding. No ascites, distension, hepatosplenomegaly.   MSK: No deformities or effusions.  No cyanosis or clubbing. Neuro: Cranial nerves normal.  Sensation intact to light touch. Speech is fluent.  Muscle  strength normal.    Psych: Sensorium intact and responding to questions, attention normal.  Behavior appropriate.  Affect noraml.  Judgment and insight appear normal.     Labs on Admission:  I have personally reviewed following labs and imaging studies: CBC:  Recent Labs Lab 12/23/16 1740  WBC 18.9*  NEUTROABS 17.7*  HGB 15.7*  HCT 45.6  MCV 93.3  PLT 383   Basic Metabolic Panel:  Recent Labs Lab 12/23/16 1740 12/23/16 2308 12/24/16 1944  NA 136 141 135  K 4.0 4.3 4.1  CL 100* 112* 104  CO2 13* 17* 16*  GLUCOSE 380* 180* 259*  BUN 15 12 10   CREATININE 0.85 0.61 0.60  CALCIUM 9.5 8.1* 9.2   GFR: Estimated Creatinine Clearance: 83.5 mL/min (by C-G formula based on SCr of 0.6 mg/dL).  Liver Function Tests:  Recent Labs Lab 12/23/16 1740 12/23/16 2308 12/24/16 1944  AST 24 24 17   ALT 86* 66* 52  ALKPHOS 171* 130* 119  BILITOT 1.0 0.9 1.4*  PROT 8.6* 7.1  6.8  ALBUMIN 4.7 3.7 4.1    Recent Labs Lab 12/23/16 1740  LIPASE 23   No results for input(s): AMMONIA in the last 168 hours. Coagulation Profile: No results for input(s): INR, PROTIME in the last 168 hours. Cardiac Enzymes: No results for input(s): CKTOTAL, CKMB, CKMBINDEX, TROPONINI in the last 168 hours. BNP (last 3 results) No results for input(s): PROBNP in the last 8760 hours. HbA1C:  Recent Labs  12/24/16 1944  HGBA1C 10.0*   CBG:  Recent Labs Lab 12/23/16 2002 12/23/16 2209 12/24/16 1833  GLUCAP 256* 193* 233*   Lipid Profile: No results for input(s): CHOL, HDL, LDLCALC, TRIG, CHOLHDL, LDLDIRECT in the last 72 hours. Thyroid Function Tests: No results for input(s): TSH, T4TOTAL, FREET4, T3FREE, THYROIDAB in the last 72 hours. Anemia Panel: No results for input(s): VITAMINB12, FOLATE, FERRITIN, TIBC, IRON, RETICCTPCT in the last 72 hours. Sepsis Labs: Invalid input(s): PROCALCITONIN, LACTICIDVEN No results found for this or any previous visit (from the past 240 hour(s)).        Radiological Exams on Admission: Personally reviewed RUQ [place] report: US Abdomen Limited Ruq  Result Date: 12/23/2016 CLINICAL DATA:  Bilateral upper quadrant pain with nausea and vomiting for 3 days EXAM: ULTRASOUND ABDOMEN LIMITED RIGHT UPPER QUADRANT COMPARISON:  09/07/2014 FINDINGS: Gallbladder: No gallstones or wall thickening visualized. No sonographic Murphy sign noted by sonographer. Common bile duct: Diameter: 2 mm Liver: No focal lesion identified. Appears with decreased echogenicity. Portal vein is patent on color Doppler imaging with normal direction of blood flow towards the liver. IMPRESSION: 1. Negative for gallstones or biliary dilatation 2. Liver appears hypoechoic, consider hepatitis. Suggest correlation with laboratory values. Electronically Signed   By: Jasmine Pang M.D.   On: 12/23/2016 20:00         Assessment/Plan  1. DKA, resolving, new onset diabetes:  Gestational diabetes, now progressed to frank diabetes, undiagnosed.  She has experience with metformin and Tradjenta per chart.     -lantus 10 units nightly -SSI with meals -Plan to discharge on Lantus and metformin, without mealtime insulin for now -Will need PCP follow up -Consult to Diabetes educator   2. Transaminitis:  Unclear what this is from.  NASH versus choledocholithiasis.  No alcohol use.  RUQ [place] is normal.  Lipase normal but epigastric pain on exam. -CT abdomen to rule out pancreatitis -Trend CMP -Check bilirubin fractions -Follow hep B and hep C testing -HIV testing ordered  3. Hypertension:  Patient also somewhat unaware of this, but chart review shows she was diagnosed by her OB with chronic hypertension, and she is hypertensive here -Restart HCTZ      DVT prophylaxis: Lovenox  Code Status: FULL  Family Communication: None present  Disposition Plan: Anticipate IV fluids and start insulin and diabetes education. Consults called: None Admission status: OBS At the point of initial  evaluation, it is my clinical opinion that admission for OBSERVATION is reasonable and necessary because the patient's presenting complaints in the context of their chronic conditions represent sufficient risk of deterioration or significant morbidity to constitute reasonable grounds for close observation in the hospital setting, but that the patient may be medically stable for discharge from the hospital within 24 to 48 hours.    Medical decision making: Patient seen at 11:40 PM on 12/24/2016.  The patient was discussed with Wynelle Bourgeois, CNM.  What exists of the patient's chart was reviewed in depth and summarized above.  Clinical condition: stable.  Edwin Dada [facility] Pager [phone]

## 2016-12-26 LAB — CBC
HEMATOCRIT: 39.5 % (ref 36.0–46.0)
Hemoglobin: 13.7 g/dL (ref 12.0–15.0)
MCH: 30.9 pg (ref 26.0–34.0)
MCHC: 34.7 g/dL (ref 30.0–36.0)
MCV: 89 fL (ref 78.0–100.0)
Platelets: 424 10*3/uL — ABNORMAL HIGH (ref 150–400)
RBC: 4.44 MIL/uL (ref 3.87–5.11)
RDW: 11.7 % (ref 11.5–15.5)
WBC: 10 10*3/uL (ref 4.0–10.5)

## 2016-12-26 LAB — BASIC METABOLIC PANEL
ANION GAP: 13 (ref 5–15)
CO2: 20 mmol/L — AB (ref 22–32)
Calcium: 8.9 mg/dL (ref 8.9–10.3)
Chloride: 100 mmol/L — ABNORMAL LOW (ref 101–111)
Creatinine, Ser: 0.72 mg/dL (ref 0.44–1.00)
GFR calc Af Amer: >60 mL/min (ref >60)
GFR calc non Af Amer: >60 mL/min (ref >60)
GLUCOSE: 189 mg/dL — AB (ref 65–99)
Potassium: 3.1 mmol/L — ABNORMAL LOW (ref 3.5–5.1)
Sodium: 133 mmol/L — ABNORMAL LOW (ref 135–145)

## 2016-12-26 LAB — GLUCOSE, CAPILLARY
GLUCOSE-CAPILLARY: 142 mg/dL — AB (ref 65–99)
Glucose-Capillary: 153 mg/dL — ABNORMAL HIGH (ref 65–99)
Glucose-Capillary: 224 mg/dL — ABNORMAL HIGH (ref 65–99)
Glucose-Capillary: 193 mg/dL — ABNORMAL HIGH (ref 65–99)

## 2016-12-26 MED ORDER — POTASSIUM CHLORIDE 10 MEQ/100ML IV SOLN
10.0000 meq | INTRAVENOUS | Status: AC
Start: 2016-12-26 — End: 2016-12-26
  Administered 2016-12-26 (×4): 10 meq via INTRAVENOUS

## 2016-12-26 MED ORDER — LISINOPRIL 5 MG PO TABS
5.0000 mg | ORAL_TABLET | Freq: Every day | ORAL | Status: DC
  Administered 2016-12-26 – 2016-12-27 (×2): 5 mg via ORAL
  Filled 2016-12-26 (×2): qty 1

## 2016-12-26 MED ORDER — ORAL CARE MOUTH RINSE
15.0000 mL | Freq: Two times a day (BID) | OROMUCOSAL | Status: DC
  Administered 2016-12-26 – 2016-12-28 (×4): 15 mL via OROMUCOSAL

## 2016-12-26 MED ORDER — LACTATED RINGERS IV SOLN
INTRAVENOUS | Status: DC
  Administered 2016-12-27: 08:00:00 via INTRAVENOUS

## 2016-12-26 MED ORDER — POTASSIUM CHLORIDE 10 MEQ/100ML IV SOLN
INTRAVENOUS | Status: AC
  Administered 2016-12-26: 10 meq
  Filled 2016-12-26: qty 100

## 2016-12-26 NOTE — Progress Notes (Signed)
PROGRESS NOTE  Cheryl Parker  ONG:[id number] DOB: May 06, 1980 DOA: 12/24/2016 PCP: Patient, No Pcp Per   Brief Narrative: Cheryl Parker is a 36 y.o. G3P3 with a history of GDM with all pregnancies who presented 8/31 with abdominal pain, nausea and vomiting for 3 days. Initial evaluation demonstrated hyperglycemia, ketonuria, and anion-gap acidosis consistent with DKA. RUQ U/S and subsequent CT abd/pelvis were unremarkable. GI was consulted, recommending bowel rest.   Assessment & Plan: Principal Problem:   DKA (diabetic ketoacidoses) (HCC) Active Problems:   Type 2 diabetes mellitus with hyperglycemia (HCC)   Essential hypertension   Transaminitis   Diabetic ketoacidosis (HCC)  DKA: Resolved.  T2DM: HbA1c 10%. Will likely need insulin therapy.  - Age of onset is intermediate, though presenting in DKA in relatively young pt raises question of primary pancreatic insufficiency. Will check C-peptide.  - Diabetes coordinator assistance appreciated. Pt had A2DM requiring insulin during pregnancies, so she's familiar with this.  - Has history of intolerance to metformin.  - Start lisinopril with elevated BP. Monitor BMP.  - Check FLP, plan to start statin.  - Advance diet per GI, ultimately to carb-modified diet   Epigastric pain, nausea, vomiting: Suspect due to DKA, possibly gastroparesis, GERD/PUD. UPT neg, Hep B SAg neg, HIV NR, tylenol neg. CT showed focal fatty infiltration along falciform ligament of the liver, otherwise unremarkable without transaminase elevation. - Carafate, PPI. Phenergan prn.  - Tx DKA/DM as above - GI consulted, not recommending further evaluation at this time. Has never had EGD.   Aortic atherosclerosis: Noted on CT.  - Risk factor modification as above.   DVT prophylaxis: SCDs, ambulation Code Status: Full Family Communication: None at bedside Disposition Plan: Home once improving, able to tolerate diet.   Consultants:   GI  Diabetes  coordinator  Procedures:   None  Antimicrobials:  None   Subjective: Abdominal pain across upper quadrants (L>R) is moderate-severe, improved with medications, worse with emesis. She's hungry and wants to try solid food. No diarrhea or constipation. Denies urinary complaints.   Objective: BP (!) 155/78 (BP Location: Left Arm)   Pulse 88   Temp 98.4 F (36.9 C) (Oral)   Resp 19   Ht 5\' 1"  (1.549 m)   Wt 64.4 kg (142 lb)   SpO2 100%   BMI 26.83 kg/m   Gen: 36 y.o. female in no distress Pulm: Non-labored breathing room air. Clear to auscultation bilaterally.  CV: Regular rate and rhythm. No murmur, rub, or gallop. No JVD, no pedal edema. GI: Abdomen soft, mildly tender to deep palpation worst in LUQ, non-distended, with normoactive bowel sounds. No organomegaly or masses felt. Ext: Warm, no deformities Skin: No rashes, lesions no ulcers Neuro: Alert and oriented. No focal neurological deficits. Psych: Judgement and insight appear normal. Mood & affect appropriate.   CBC:  Recent Labs Lab 12/23/16 1740 12/25/16 0034 12/26/16 0406  WBC 18.9* 11.8* 10.0  NEUTROABS 17.7*  --   --   HGB 15.7* 12.6 13.7  HCT 45.6 38.3 39.5  MCV 93.3 93.6 89.0  PLT 383 364 424*   Basic Metabolic Panel:  Recent Labs Lab 12/23/16 1740 12/23/16 2308 12/24/16 1944 12/25/16 0034 12/26/16 0406  NA 136 141 135 137 133*  K 4.0 4.3 4.1 4.1 3.1*  CL 100* 112* 104 110 100*  CO2 13* 17* 16* 16* 20*  GLUCOSE 380* 180* 259* 252* 189*  BUN 15 12 10 6  <5*  CREATININE 0.85 0.61 0.60 0.75 0.72  CALCIUM 9.5 8.1*  9.2 8.4* 8.9   GFR: Estimated Creatinine Clearance: 83.5 mL/min (by C-G formula based on SCr of 0.72 mg/dL). Liver Function Tests:  Recent Labs Lab 12/23/16 1740 12/23/16 2308 12/24/16 1944 12/25/16 0034  AST 24 24 17 16   ALT 86* 66* 52 46  ALKPHOS 171* 130* 119 105  BILITOT 1.0 0.9 1.4* 1.4*  1.3*  PROT 8.6* 7.1 6.8 6.4*  ALBUMIN 4.7 3.7 4.1 3.4*    Recent Labs Lab  12/23/16 1740  LIPASE 23   HbA1C:  Recent Labs  12/24/16 1944 12/25/16 1254  HGBA1C 10.0* 9.8*   CBG:  Recent Labs Lab 12/25/16 1410 12/25/16 1718 12/25/16 2120 12/26/16 0153 12/26/16 1125  GLUCAP 207* 189* 186* 153* 224*   Lipid Profile: No results for input(s): CHOL, HDL, LDLCALC, TRIG, CHOLHDL, LDLDIRECT in the last 72 hours.  Urine analysis:    Component Value Date/Time   COLORURINE YELLOW 12/24/2016 1915   APPEARANCEUR HAZY (A) 12/24/2016 1915   LABSPEC 1.025 12/24/2016 1915   PHURINE 5.0 12/24/2016 1915   GLUCOSEU >=500 (A) 12/24/2016 1915   HGBUR SMALL (A) 12/24/2016 1915   BILIRUBINUR NEGATIVE 12/24/2016 1915   KETONESUR 80 (A) 12/24/2016 1915   PROTEINUR 30 (A) 12/24/2016 1915   UROBILINOGEN 0.2 06/23/2015 0852   NITRITE NEGATIVE 12/24/2016 1915   LEUKOCYTESUR MODERATE (A) 12/24/2016 1915   Radiology Studies: Ct Abdomen Pelvis W Contrast  Result Date: 12/25/2016 CLINICAL DATA:  Nausea and vomiting for 3 days. Mid abdominal pain. New onset diabetes. Transaminitis. EXAM: CT ABDOMEN AND PELVIS WITH CONTRAST TECHNIQUE: Multidetector CT imaging of the abdomen and pelvis was performed using the standard protocol following bolus administration of intravenous contrast. CONTRAST:  100mL ISOVUE-300 IOPAMIDOL (ISOVUE-300) INJECTION 61% COMPARISON:  Right upper quadrant ultrasound 12/23/2016. CT of the abdomen and pelvis 09/07/2014 FINDINGS: Lower chest: The lung bases are clear without focal nodule, mass, or airspace disease. Heart size normal. No significant pleural or pericardial effusion is present. Hepatobiliary: There is focal fatty infiltration along the falciform ligament. No other focal lesions are present. The liver is otherwise unremarkable. The common bile duct and gallbladder are within normal limits. Pancreas: Unremarkable. No pancreatic ductal dilatation or surrounding inflammatory changes. Spleen: Normal in size without focal abnormality. Adrenals/Urinary  Tract: The adrenal glands are normal bilaterally. Kidneys and ureters are within normal limits. The urinary bladder is within normal limits. Stomach/Bowel: The stomach and duodenum are within normal limits. The small bowel is unremarkable. The appendix is visualized and normal. The ascending and transverse colon are within normal limits. The descending and sigmoid colon are unremarkable. Vascular/Lymphatic: Atherosclerotic changes are present in the distal aorta without aneurysm or significant stenosis. No significant adenopathy is present. Reproductive: Uterus and bilateral adnexa are unremarkable. Other: No abdominal wall hernia or abnormality. No abdominopelvic ascites. Musculoskeletal: Vertebral body heights and alignment are maintained. No focal lytic or blastic lesions are present. The bony pelvis is within normal limits. The hips are unremarkable. IMPRESSION: 1. No acute or focal lesion to explain the patient's abdominal pain, nausea, or vomiting. 2. Aortic Atherosclerosis (ICD10-I70.0). Changes of atherosclerosis within the distal aorta without aneurysm or focal stenosis. Electronically Signed   By: Marin Robertshristopher  Mattern M.D.   On: 12/25/2016 09:30    Time spent: 25 minutes.  Hazeline Junkeryan Garo [place], MD [facility] Pager [phone]  If 7PM-7AM, please contact night-coverage [email or web address] Password [email or web address] 12/26/2016, 12:24 PM

## 2016-12-26 NOTE — Progress Notes (Signed)
Subjective: Abdominal pain slightly better. Now also having back pain.  Objective: Vital signs in last 24 hours: Temp:  [98.1 F (36.7 C)-98.4 F (36.9 C)] 98.4 F (36.9 C) (09/02 0522) Pulse Rate:  [88] 88 (09/02 0522) Resp:  [19] 19 (09/02 0522) BP: (154-155)/(71-78) 155/78 (09/02 0522) SpO2:  [100 %] 100 % (09/02 0522) Weight change:     PE: GEN:  Overweight, flat affect, mildly uncomfortable appearing but NAD ABD:  Protuberant, soft, mild generalized tenderness  Lab Results: CBC    Component Value Date/Time   WBC 10.0 12/26/2016 0406   RBC 4.44 12/26/2016 0406   HGB 13.7 12/26/2016 0406   HGB 12.8 01/10/2012   HCT 39.5 12/26/2016 0406   PLT 424 (H) 12/26/2016 0406   PLT 372 01/10/2012   MCV 89.0 12/26/2016 0406   MCH 30.9 12/26/2016 0406   MCHC 34.7 12/26/2016 0406   RDW 11.7 12/26/2016 0406   LYMPHSABS 1.0 12/23/2016 1740   MONOABS 0.2 12/23/2016 1740   EOSABS 0.0 12/23/2016 1740   BASOSABS 0.0 12/23/2016 1740   CMP     Component Value Date/Time   NA 133 (L) 12/26/2016 0406   K 3.1 (L) 12/26/2016 0406   CL 100 (L) 12/26/2016 0406   CO2 20 (L) 12/26/2016 0406   GLUCOSE 189 (H) 12/26/2016 0406   BUN <5 (L) 12/26/2016 0406   CREATININE 0.72 12/26/2016 0406   CREATININE 0.46 (L) 08/04/2015 1406   CALCIUM 8.9 12/26/2016 0406   PROT 6.4 (L) 12/25/2016 0034   ALBUMIN 3.4 (L) 12/25/2016 0034   AST 16 12/25/2016 0034   ALT 46 12/25/2016 0034   ALKPHOS 105 12/25/2016 0034   BILITOT 1.3 (H) 12/25/2016 0034   BILITOT 1.4 (H) 12/25/2016 0034   GFRNONAA >60 12/26/2016 0406   GFRAA >60 12/26/2016 0406   Assessment:  1.  Abdominal pain, slight improvement, with negative CT, ultrasound, labs.  DKA can cause this pain, but patient no longer has acidosis.  Gastroparesis can cause pain.  Symptoms seem very atypical for gallbladder or upper GI source.  There seems disconnect between patient's symptoms and objective findings.  Plan:  1.  Continue PPI and  sucralfate. 2.  Full liquid diet ok. 3.  Antiemetics as needed. 4.  If pain not much better tomorrow, would get CT angiogram/venogram tomorrow, specifically to look for mesenteric vascular thrombosis or compromising atherosclerotic disease (although, as I mentioned yesterday, with several days' of abdominal pain before CT done, I would have expected some sort of bowel edema or such similar finding on her recent prior CT). 5.  If CT angiogram is done tomorrow and is unrevealing and symptoms persist, would do endoscopy Tuesday. 6.  Will follow.   Freddy JakschOUTLAW,Cheryl Depoy M 12/26/2016, 4:13 PM   Cell [phone] If no answer or after 5 PM call [phone]

## 2016-12-27 ENCOUNTER — Inpatient Hospital Stay (HOSPITAL_COMMUNITY): Payer: Self-pay

## 2016-12-27 ENCOUNTER — Encounter (HOSPITAL_COMMUNITY): Payer: Self-pay | Admitting: *Deleted

## 2016-12-27 LAB — GLUCOSE, CAPILLARY
GLUCOSE-CAPILLARY: 192 mg/dL — AB (ref 65–99)
GLUCOSE-CAPILLARY: 223 mg/dL — AB (ref 65–99)
Glucose-Capillary: 243 mg/dL — ABNORMAL HIGH (ref 65–99)
Glucose-Capillary: 125 mg/dL — ABNORMAL HIGH (ref 65–99)

## 2016-12-27 LAB — COMPREHENSIVE METABOLIC PANEL
ALBUMIN: 3.3 g/dL — AB (ref 3.5–5.0)
ALT: 26 U/L (ref 14–54)
ANION GAP: 11 (ref 5–15)
AST: 13 U/L — AB (ref 15–41)
Alkaline Phosphatase: 99 U/L (ref 38–126)
BILIRUBIN TOTAL: 1.5 mg/dL — AB (ref 0.3–1.2)
CALCIUM: 8.7 mg/dL — AB (ref 8.9–10.3)
CO2: 24 mmol/L (ref 22–32)
CREATININE: 0.66 mg/dL (ref 0.44–1.00)
Chloride: 98 mmol/L — ABNORMAL LOW (ref 101–111)
GFR calc Af Amer: >60 mL/min (ref >60)
GFR calc non Af Amer: >60 mL/min (ref >60)
GLUCOSE: 208 mg/dL — AB (ref 65–99)
Potassium: 2.9 mmol/L — ABNORMAL LOW (ref 3.5–5.1)
Sodium: 133 mmol/L — ABNORMAL LOW (ref 135–145)
TOTAL PROTEIN: 6.2 g/dL — AB (ref 6.5–8.1)

## 2016-12-27 LAB — CBC
HCT: 41.8 % (ref 36.0–46.0)
Hemoglobin: 14.2 g/dL (ref 12.0–15.0)
MCH: 30.6 pg (ref 26.0–34.0)
MCHC: 34 g/dL (ref 30.0–36.0)
MCV: 90.1 fL (ref 78.0–100.0)
PLATELETS: 390 10*3/uL (ref 150–400)
RBC: 4.64 MIL/uL (ref 3.87–5.11)
RDW: 11.9 % (ref 11.5–15.5)
WBC: 10.8 10*3/uL — ABNORMAL HIGH (ref 4.0–10.5)

## 2016-12-27 LAB — LIPID PANEL
CHOLESTEROL: 239 mg/dL — AB (ref 0–200)
HDL: 26 mg/dL — ABNORMAL LOW (ref >40)
LDL CALC: 194 mg/dL — AB (ref 0–99)
Total CHOL/HDL Ratio: 9.2 RATIO
Triglycerides: 96 mg/dL (ref <150)
VLDL: 19 mg/dL (ref 0–40)

## 2016-12-27 LAB — HEPATITIS C ANTIBODY

## 2016-12-27 MED ORDER — SODIUM CHLORIDE 0.9 % IV SOLN
INTRAVENOUS | Status: DC
  Administered 2016-12-27: 09:00:00 via INTRAVENOUS
  Filled 2016-12-27: qty 1000

## 2016-12-27 MED ORDER — POTASSIUM CHLORIDE 10 MEQ/100ML IV SOLN
10.0000 meq | INTRAVENOUS | Status: AC
  Administered 2016-12-27 (×6): 10 meq via INTRAVENOUS
  Filled 2016-12-27 (×6): qty 100

## 2016-12-27 MED ORDER — INSULIN ASPART 100 UNIT/ML ~~LOC~~ SOLN
0.0000 [IU] | Freq: Four times a day (QID) | SUBCUTANEOUS | Status: DC
  Administered 2016-12-27: 5 [IU] via SUBCUTANEOUS

## 2016-12-27 MED ORDER — IOPAMIDOL (ISOVUE-370) INJECTION 76%
INTRAVENOUS | Status: AC
  Administered 2016-12-27: 100 mL
  Filled 2016-12-27: qty 100

## 2016-12-27 MED ORDER — INSULIN ASPART 100 UNIT/ML ~~LOC~~ SOLN
0.0000 [IU] | SUBCUTANEOUS | Status: DC
  Administered 2016-12-27: 5 [IU] via SUBCUTANEOUS
  Administered 2016-12-27: 2 [IU] via SUBCUTANEOUS
  Administered 2016-12-28: 8 [IU] via SUBCUTANEOUS
  Administered 2016-12-28: 5 [IU] via SUBCUTANEOUS
  Administered 2016-12-28: 2 [IU] via SUBCUTANEOUS

## 2016-12-27 MED ORDER — POTASSIUM CHLORIDE IN NACL 20-0.9 MEQ/L-% IV SOLN
INTRAVENOUS | Status: DC
  Administered 2016-12-27 – 2016-12-28 (×3): via INTRAVENOUS
  Filled 2016-12-27 (×3): qty 1000

## 2016-12-27 MED ORDER — SODIUM CHLORIDE 0.9 % IV SOLN: INTRAVENOUS | Status: DC

## 2016-12-27 MED ORDER — INSULIN GLARGINE 100 UNIT/ML ~~LOC~~ SOLN
18.0000 [IU] | Freq: Every day | SUBCUTANEOUS | Status: DC
  Administered 2016-12-27 – 2016-12-28 (×2): 18 [IU] via SUBCUTANEOUS
  Filled 2016-12-27 (×2): qty 0.18

## 2016-12-27 NOTE — Progress Notes (Signed)
PROGRESS NOTE  Cheryl Bolderamisha Fernandez  RUE:[id number] DOB: 1981/04/20 DOA: 12/24/2016 PCP: Patient, No Pcp Per   Brief Narrative: Cheryl Parker is a 36 y.o. G3P3 with a history of GDM with all pregnancies who presented 8/31 with abdominal pain, nausea and vomiting for 3 days. Initial evaluation demonstrated hyperglycemia, ketonuria, and anion-gap acidosis consistent with DKA. RUQ U/S and subsequent CT abd/pelvis were unremarkable. GI was consulted, recommending bowel rest.   Assessment & Plan: Principal Problem:   DKA (diabetic ketoacidoses) (HCC) Active Problems:   Type 2 diabetes mellitus with hyperglycemia (HCC)   Essential hypertension   Transaminitis   Diabetic ketoacidosis (HCC)  DKA: Resolved.  Epigastric pain, nausea, vomiting: Suspect due to DKA, possibly gastroparesis, GERD/PUD. UPT neg, Hep B SAg neg, HIV NR, tylenol neg. CT showed focal fatty infiltration along falciform ligament of the liver, otherwise unremarkable without transaminase elevation. - Carafate, PPI. Phenergan prn.  - Tx DKA/DM as above - GI following, recommending CT angiography of abdomen/pelvis, ordered to be performed following potassium runs.   T2DM: HbA1c 10%. Will need insulin therapy.  - Age of onset is intermediate, though presenting in DKA in relatively young pt raises question of primary pancreatic insufficiency. Will check C-peptide.  - Diabetes coordinator assistance appreciated. Pt had A2DM requiring insulin during pregnancies, so she's familiar with this, will plan to DC on 70/30 insulin with requirement calculated from inpatient use.  - Has history of intolerance to metformin.  - Started lisinopril with elevated BP. Monitor BMP.  - Advance diet per GI, ultimately to carb-modified diet.   Dyslipidemia: LDL 124, HDL 26.  - Recommend lifestyle modifications, PCP follow up.  - Despite young age, risk is sufficient to start statin if lipids remain abnormal.   Aortic atherosclerosis: Noted on CT.  -  Risk factor modification as above.   Hypokalemia: From poor per oral intake.  - Replace via IV starting this AM due to unreliable PO tolerance.   DVT prophylaxis: SCDs, ambulation Code Status: Full Family Communication: None at bedside Disposition Plan: Home once improving, able to tolerate diet.   Consultants:   GI, Dr. Dulce Sellarutlaw  Diabetes coordinator  Procedures:   None  Antimicrobials:  None   Subjective: Abdominal pain improved this PM, tolerating clear liquid diet.    Objective: BP 126/71 (BP Location: Left Arm)   Pulse 88   Temp 98.6 F (37 C) (Oral)   Resp 19   Ht 5\' 1"  (1.549 m)   Wt 64.4 kg (142 lb)   SpO2 100%   BMI 26.83 kg/m   Gen: 36 y.o. female in no distress Pulm: Non-labored breathing room air. Clear to auscultation bilaterally.  CV: Regular rate and rhythm. No murmur, rub, or gallop. No JVD, no pedal edema. GI: Abdomen soft, mildly tender to deep palpation worst in LUQ, non-distended, with normoactive bowel sounds. No organomegaly or masses felt. Ext: Warm, no deformities Skin: No rashes, lesions no ulcers Neuro: Alert and oriented. No focal neurological deficits. Psych: Judgement and insight appear normal. Mood & affect appropriate.   CBC:  Recent Labs Lab 12/23/16 1740 12/25/16 0034 12/26/16 0406 12/27/16 0343  WBC 18.9* 11.8* 10.0 10.8*  NEUTROABS 17.7*  --   --   --   HGB 15.7* 12.6 13.7 14.2  HCT 45.6 38.3 39.5 41.8  MCV 93.3 93.6 89.0 90.1  PLT 383 364 424* 390   Basic Metabolic Panel:  Recent Labs Lab 12/23/16 2308 12/24/16 1944 12/25/16 0034 12/26/16 0406 12/27/16 0343  NA 141 135 137  133* 133*  K 4.3 4.1 4.1 3.1* 2.9*  CL 112* 104 110 100* 98*  CO2 17* 16* 16* 20* 24  GLUCOSE 180* 259* 252* 189* 208*  BUN 12 10 6  <5* <5*  CREATININE 0.61 0.60 0.75 0.72 0.66  CALCIUM 8.1* 9.2 8.4* 8.9 8.7*   GFR: Estimated Creatinine Clearance: 83.5 mL/min (by C-G formula based on SCr of 0.66 mg/dL). Liver Function  Tests:  Recent Labs Lab 12/23/16 1740 12/23/16 2308 12/24/16 1944 12/25/16 0034 12/27/16 0343  AST 24 24 17 16  13*  ALT 86* 66* 52 46 26  ALKPHOS 171* 130* 119 105 99  BILITOT 1.0 0.9 1.4* 1.4*  1.3* 1.5*  PROT 8.6* 7.1 6.8 6.4* 6.2*  ALBUMIN 4.7 3.7 4.1 3.4* 3.3*    Recent Labs Lab 12/23/16 1740  LIPASE 23   HbA1C:  Recent Labs  12/24/16 1944 12/25/16 1254  HGBA1C 10.0* 9.8*   CBG:  Recent Labs Lab 12/26/16 1125 12/26/16 1825 12/26/16 2126 12/27/16 0343 12/27/16 1208  GLUCAP 224* 193* 142* 192* 243*   Lipid Profile:  Recent Labs  12/27/16 0343  CHOL 239*  HDL 26*  LDLCALC 194*  TRIG 96  CHOLHDL 9.2    Urine analysis:    Component Value Date/Time   COLORURINE YELLOW 12/24/2016 1915   APPEARANCEUR HAZY (A) 12/24/2016 1915   LABSPEC 1.025 12/24/2016 1915   PHURINE 5.0 12/24/2016 1915   GLUCOSEU >=500 (A) 12/24/2016 1915   HGBUR SMALL (A) 12/24/2016 1915   BILIRUBINUR NEGATIVE 12/24/2016 1915   KETONESUR 80 (A) 12/24/2016 1915   PROTEINUR 30 (A) 12/24/2016 1915   UROBILINOGEN 0.2 06/23/2015 0852   NITRITE NEGATIVE 12/24/2016 1915   LEUKOCYTESUR MODERATE (A) 12/24/2016 1915   Time spent: 25 minutes.  Hazeline Junker, MD [facility] Pager [phone]  If 7PM-7AM, please contact night-coverage [email or web address] Password [email or web address] 12/27/2016, 3:29 PM

## 2016-12-27 NOTE — Progress Notes (Signed)
Pt complained of pain to the IV site. Stopped infusion. Removed infiltrated IV. Warm compress applied. Notified IV team to put a new IV in.

## 2016-12-27 NOTE — Progress Notes (Signed)
Subjective: Ongoing abdominal pain, maybe slightly better.  Objective: Vital signs in last 24 hours: Temp:  [98.2 F (36.8 C)-98.8 F (37.1 C)] 98.3 F (36.8 C) (09/03 0639) Pulse Rate:  [87-92] 87 (09/03 0639) Resp:  [18-19] 19 (09/03 0639) BP: (131-135)/(69-76) 135/76 (09/03 0639) SpO2:  [100 %] 100 % (09/03 0639) Weight change:  Last BM Date: 12/23/16  PE: GEN:  NAD, overweight ABD:  Soft, protuberant, mild generalized tenderness  Lab Results: CBC    Component Value Date/Time   WBC 10.8 (H) 12/27/2016 0343   RBC 4.64 12/27/2016 0343   HGB 14.2 12/27/2016 0343   HGB 12.8 01/10/2012   HCT 41.8 12/27/2016 0343   PLT 390 12/27/2016 0343   PLT 372 01/10/2012   MCV 90.1 12/27/2016 0343   MCH 30.6 12/27/2016 0343   MCHC 34.0 12/27/2016 0343   RDW 11.9 12/27/2016 0343   LYMPHSABS 1.0 12/23/2016 1740   MONOABS 0.2 12/23/2016 1740   EOSABS 0.0 12/23/2016 1740   BASOSABS 0.0 12/23/2016 1740   CMP     Component Value Date/Time   NA 133 (L) 12/27/2016 0343   K 2.9 (L) 12/27/2016 0343   CL 98 (L) 12/27/2016 0343   CO2 24 12/27/2016 0343   GLUCOSE 208 (H) 12/27/2016 0343   BUN <5 (L) 12/27/2016 0343   CREATININE 0.66 12/27/2016 0343   CREATININE 0.46 (L) 08/04/2015 1406   CALCIUM 8.7 (L) 12/27/2016 0343   PROT 6.2 (L) 12/27/2016 0343   ALBUMIN 3.3 (L) 12/27/2016 0343   AST 13 (L) 12/27/2016 0343   ALT 26 12/27/2016 0343   ALKPHOS 99 12/27/2016 0343   BILITOT 1.5 (H) 12/27/2016 0343   GFRNONAA >60 12/27/2016 0343   GFRAA >60 12/27/2016 0343   Assessment:  1.  Abdominal pain, slight improvement, with negative CT, ultrasound, labs.  DKA can cause this pain, but patient no longer has acidosis.  Gastroparesis can cause pain.  Symptoms seem very atypical for gallbladder or upper GI source.  There seems disconnect between patient's symptoms and objective findings. 2.  Hypokalemia. 3.  Hyperglycemia. 4.  Protein calorie malnutrition.  Plan:  1.  Continue PPI and  sucralfate. 2.  Antiemetics as needed. 3.  Replete potassium. 4.  Once potassium repleted, patient needs CT angiogram abdomen/pelvis, specifically to assess for mesenteric ischemia of either the arterial OR venous systems (prior CT scan not adequate for that). 5.  Once potassium repleted and once/if CT angiogram is unrevealing, would likely pursue endoscopy tomorrow for further evaluation. 6.  Risks (bleeding, infection, bowel perforation that could require surgery, sedation-related changes in cardiopulmonary systems), benefits (identification and possible treatment of source of symptoms, exclusion of certain causes of symptoms), and alternatives (watchful waiting, radiographic imaging studies, empiric medical treatment) of upper endoscopy (EGD) were explained to patient/family in detail and patient wishes to proceed.    Renesmay Nesbitt M 12/27/2016, 10:48 AM   Cell [phone] If no answer or after 5 PM call [phone]  

## 2016-12-27 NOTE — Progress Notes (Addendum)
Inpatient Diabetes Program Recommendations  AACE/ADA: New Consensus Statement on Inpatient Glycemic Control (2015)  Target Ranges:  Prepandial:   less than 140 mg/dL      Peak postprandial:   less than 180 mg/dL (1-2 hours)      Critically ill patients:  140 - 180 mg/dL   Lab Results  Component Value Date   GLUCAP 243 (H) 12/27/2016   HGBA1C 9.8 (H) 12/25/2016    Review of Glycemic Control Results for Cheryl Parker, Cheryl Parker (MRN [id number]) as of 12/27/2016 14:40  Ref. Range 12/26/2016 11:25 12/26/2016 18:25 12/26/2016 21:26 12/27/2016 03:43 12/27/2016 12:08  Glucose-Capillary Latest Ref Range: 65 - 99 mg/dL 224 (H) 193 (H) 142 (H) 192 (H) 243 (H)   Diabetes history: New onset Outpatient Diabetes medications: None Current orders for Inpatient glycemic control: Lantus 18 units qd + Novolog correction mod q 6 hrs.  Inpatient Diabetes Program Recommendations:    -While NPO, increase Novolog correction to q 4 hrs. Spoke with pt about new diagnosis. Discussed A1C results with them and explained what an A1C is, basic pathophysiology of DM Type 2, basic home care, basic diabetes diet nutrition principles, importance of checking CBGs and maintaining good CBG control to prevent long-term and short-term complications. Reviewed signs and symptoms of hyperglycemia and hypoglycemia and how to treat hypoglycemia at home. Also reviewed blood sugar goals at home.  RNs to provide ongoing basic DM education at bedside with this patient. Have ordered educational booklet, insulin starter kit, and DM videos. Have also placed RD consult for DM diet education for this patient.   Patient states she has been craving and drinking 2 liter coke or cheerwine @ home for the past 2 months. Reviewed with patient option of Relion meter and strips for approx. $25.  Thank you, Cheryl Parker. Cheryl Heasley, RN, MSN, CDE  Diabetes Coordinator Inpatient Glycemic Control Team Team Pager [phone] (8am-5pm) 12/27/2016 2:48 PM

## 2016-12-28 ENCOUNTER — Encounter (HOSPITAL_COMMUNITY): Payer: Self-pay | Admitting: Certified Registered"

## 2016-12-28 ENCOUNTER — Inpatient Hospital Stay (HOSPITAL_COMMUNITY): Payer: Self-pay | Admitting: Certified Registered"

## 2016-12-28 ENCOUNTER — Encounter (HOSPITAL_COMMUNITY)
Admission: AD | Disposition: A | Discharge status: Home / Self Care | Payer: Self-pay | Source: Ambulatory Visit | Attending: Family Medicine

## 2016-12-28 DIAGNOSIS — E109 Type 1 diabetes mellitus without complications: Secondary | ICD-10-CM

## 2016-12-28 HISTORY — PX: ESOPHAGOGASTRODUODENOSCOPY (EGD) WITH PROPOFOL: SHX5813

## 2016-12-28 LAB — C-PEPTIDE: C-Peptide: 0.8 ng/mL — ABNORMAL LOW (ref 1.1–4.4)

## 2016-12-28 LAB — COMPREHENSIVE METABOLIC PANEL
ALBUMIN: 3.1 g/dL — AB (ref 3.5–5.0)
ALT: 19 U/L (ref 14–54)
AST: 18 U/L (ref 15–41)
Alkaline Phosphatase: 77 U/L (ref 38–126)
Anion gap: 6 (ref 5–15)
BILIRUBIN TOTAL: 0.6 mg/dL (ref 0.3–1.2)
CHLORIDE: 102 mmol/L (ref 101–111)
CO2: 27 mmol/L (ref 22–32)
CREATININE: 0.54 mg/dL (ref 0.44–1.00)
Calcium: 8.2 mg/dL — ABNORMAL LOW (ref 8.9–10.3)
GFR calc Af Amer: >60 mL/min (ref >60)
GLUCOSE: 140 mg/dL — AB (ref 65–99)
POTASSIUM: 3.1 mmol/L — AB (ref 3.5–5.1)
Sodium: 135 mmol/L (ref 135–145)
TOTAL PROTEIN: 5.5 g/dL — AB (ref 6.5–8.1)

## 2016-12-28 LAB — CBC
HEMATOCRIT: 36 % (ref 36.0–46.0)
HEMOGLOBIN: 12.3 g/dL (ref 12.0–15.0)
MCH: 30.9 pg (ref 26.0–34.0)
MCHC: 34.2 g/dL (ref 30.0–36.0)
MCV: 90.5 fL (ref 78.0–100.0)
Platelets: 412 10*3/uL — ABNORMAL HIGH (ref 150–400)
RBC: 3.98 MIL/uL (ref 3.87–5.11)
RDW: 11.8 % (ref 11.5–15.5)
WBC: 17.2 10*3/uL — ABNORMAL HIGH (ref 4.0–10.5)

## 2016-12-28 LAB — GLUCOSE, CAPILLARY
GLUCOSE-CAPILLARY: 106 mg/dL — AB (ref 65–99)
GLUCOSE-CAPILLARY: 133 mg/dL — AB (ref 65–99)
GLUCOSE-CAPILLARY: 210 mg/dL — AB (ref 65–99)
GLUCOSE-CAPILLARY: 214 mg/dL — AB (ref 65–99)
GLUCOSE-CAPILLARY: 270 mg/dL — AB (ref 65–99)
Glucose-Capillary: 51 mg/dL — ABNORMAL LOW (ref 65–99)
Glucose-Capillary: 145 mg/dL — ABNORMAL HIGH (ref 65–99)

## 2016-12-28 LAB — MAGNESIUM: Magnesium: 1.7 mg/dL (ref 1.7–2.4)

## 2016-12-28 SURGERY — ESOPHAGOGASTRODUODENOSCOPY (EGD) WITH PROPOFOL
Anesthesia: Monitor Anesthesia Care | Laterality: Left

## 2016-12-28 MED ORDER — INSULIN NPH ISOPHANE & REGULAR (70-30) 100 UNIT/ML ~~LOC~~ SUSP: 13.0000 [IU] | Freq: Two times a day (BID) | SUBCUTANEOUS | 0 refills | Status: DC

## 2016-12-28 MED ORDER — PANTOPRAZOLE SODIUM 40 MG IV SOLR: 40.0000 mg | INTRAVENOUS | Status: DC

## 2016-12-28 MED ORDER — BLOOD GLUCOSE METER KIT: PACK | 0 refills | Status: DC

## 2016-12-28 MED ORDER — POTASSIUM CHLORIDE IN NACL 40-0.9 MEQ/L-% IV SOLN
INTRAVENOUS | Status: DC
  Administered 2016-12-28: 100 mL/h via INTRAVENOUS
  Filled 2016-12-28 (×2): qty 1000

## 2016-12-28 MED ORDER — "INSULIN SYRINGE 31G X 5/16"" 0.3 ML MISC": 0 refills | Status: DC

## 2016-12-28 MED ORDER — INSULIN PEN NEEDLE 31G X 5 MM MISC: 0 refills | Status: DC

## 2016-12-28 MED ORDER — SODIUM CHLORIDE 0.9 % IV SOLN
INTRAVENOUS | Status: DC | PRN
  Administered 2016-12-28: 11:00:00 via INTRAVENOUS

## 2016-12-28 MED ORDER — GLYCOPYRROLATE 0.2 MG/ML IV SOSY
PREFILLED_SYRINGE | INTRAVENOUS | Status: DC | PRN
  Administered 2016-12-28: .1 mg via INTRAVENOUS

## 2016-12-28 MED ORDER — LIDOCAINE 2% (20 MG/ML) 5 ML SYRINGE
INTRAMUSCULAR | Status: DC | PRN
  Administered 2016-12-28: 50 mg via INTRAVENOUS

## 2016-12-28 MED ORDER — PROPOFOL 10 MG/ML IV BOLUS
INTRAVENOUS | Status: DC | PRN
  Administered 2016-12-28: 100 mg via INTRAVENOUS
  Administered 2016-12-28: 70 mg via INTRAVENOUS

## 2016-12-28 MED ORDER — LISINOPRIL 5 MG PO TABS: 5.0000 mg | ORAL_TABLET | Freq: Every day | ORAL | 0 refills | Status: DC

## 2016-12-28 MED ORDER — SUCRALFATE 1 GM/10ML PO SUSP: 1.0000 g | Freq: Three times a day (TID) | ORAL | 0 refills | Status: DC

## 2016-12-28 MED ORDER — MAGNESIUM SULFATE 2 GM/50ML IV SOLN
2.0000 g | Freq: Once | INTRAVENOUS | Status: AC
  Administered 2016-12-28: 2 g via INTRAVENOUS
  Filled 2016-12-28: qty 50

## 2016-12-28 MED ORDER — PANTOPRAZOLE SODIUM 20 MG PO TBEC: 20.0000 mg | DELAYED_RELEASE_TABLET | Freq: Every day | ORAL | 0 refills | Status: DC

## 2016-12-28 MED ORDER — SODIUM CHLORIDE 0.9 % IV SOLN
INTRAVENOUS | Status: DC
  Administered 2016-12-28: 11:00:00 via INTRAVENOUS

## 2016-12-28 MED ORDER — DEXTROSE 50 % IV SOLN
INTRAVENOUS | Status: AC
  Administered 2016-12-28: 25 mL
  Filled 2016-12-28: qty 50

## 2016-12-28 NOTE — Op Note (Signed)
[facility] Patient Name: Cheryl Parker Procedure Date : 12/28/2016 MRN: [id number] Attending MD: Kerin Salen , MD Date of Birth: 26-Jul-1980 CSN: [id number] Age: 36 Admit Type: Inpatient Procedure:                Upper GI endoscopy Indications:              Upper abdominal pain, Abdominal bloating, Nausea                            with vomiting Providers:                Kerin Salen, MD, Norman Clay, RN, Beryle Beams,                            Technician Referring MD:              Medicines:                Monitored Anesthesia Care Complications:            No immediate complications. Estimated Blood Loss:     Estimated blood loss: none. Procedure:                Pre-Anesthesia Assessment:                           - Prior to the procedure, a History and Physical                            was performed, and patient medications and                            allergies were reviewed. The patient's tolerance of                            previous anesthesia was also reviewed. The risks                            and benefits of the procedure and the sedation                            options and risks were discussed with the patient.                            All questions were answered, and informed consent                            was obtained. Prior Anticoagulants: The patient has                            taken no previous anticoagulant or antiplatelet                            agents. ASA Grade Assessment: II - A patient with                            mild  systemic disease. After reviewing the risks                            and benefits, the patient was deemed in                            satisfactory condition to undergo the procedure.                           After obtaining informed consent, the endoscope was                            passed under direct vision. Throughout the                            procedure, the patient's blood pressure, pulse,  and                            oxygen saturations were monitored continuously. The                            [id number] ([id number]) scope was introduced through the                            mouth, and advanced to the second part of duodenum.                            The upper GI endoscopy was accomplished without                            difficulty. The patient tolerated the procedure                            well. Scope In: Scope Out: Findings:      The examined esophagus was normal.      The Z-line was regular and was found 38 cm from the incisors.      Striped mildly erythematous mucosa without bleeding was found in the       gastric antrum. Biopsies were taken with a cold forceps for Helicobacter       pylori testing.      The cardia and gastric fundus were normal on retroflexion.      Diffuse mild inflammation characterized by congestion (edema), erythema       and granularity was found in the duodenal bulb and in the first portion       of the duodenum. Biopsies for histology were taken with a cold forceps       for evaluation of celiac disease. Impression:               - Normal esophagus.                           - Z-line regular, 38 cm from the incisors.                           - Erythematous mucosa  in the antrum. Biopsied.                           - Duodenitis. Biopsied. Moderate Sedation:      Patient did not receive moderate sedation for this procedure, but       instead received monitored anesthesia care. Recommendation:           - Low residue,low fiber diet.                           - Recommend small, frequent meals, good blood sugar                            control as symptoms may be related to underlying                            gastroparesis.                           - Avoid narcotis if possible.                           - Await pathology results.                           - Continue present medications.                           - Return patient to  hospital ward for ongoing care. Procedure Code(s):        --- Professional ---                           [phone], Esophagogastroduodenoscopy, flexible,                            transoral; with biopsy, single or multiple Diagnosis Code(s):        --- Professional ---                           K31.89, Other diseases of stomach and duodenum                           K29.80, Duodenitis without bleeding                           R10.10, Upper abdominal pain, unspecified                           R14.0, Abdominal distension (gaseous)                           R11.2, Nausea with vomiting, unspecified CPT copyright 2016 [employer]. All rights reserved. The codes documented in this report are preliminary and upon coder review may  be revised to meet current compliance requirements. Kerin SalenArya Katie Faraone, MD 12/28/2016 11:32:20 AM This report has been signed electronically. Number of Addenda: 0

## 2016-12-28 NOTE — Discharge Instructions (Signed)
Acute Pain, Adult  Acute pain is a type of pain that may last for just a few days or as long as six months. It is often related to an illness, injury, or medical procedure. Acute pain may be mild, moderate, or severe. It usually goes away once your injury has healed or you are no longer ill.  Pain can make it hard for you to do daily activities. It can cause anxiety and lead to other problems if left untreated. Treatment depends on the cause and severity of your acute pain.  Follow these instructions at home:   Check your pain level as told by your health care provider.   Take over-the-counter and prescription medicines only as told by your health care provider.   If you are taking prescription pain medicine:  ? Ask your health care provider about taking a stool softener or laxative to prevent constipation.  ? Do not stop taking the medicine suddenly. Talk to your health care provider about how and when to discontinue prescription pain medicine.  ? If your pain is severe, do not take more pills than instructed by your health care provider.  ? Do not take other over-the-counter pain medicines in addition to this medicine unless told by your health care provider.  ? Do not drive or operate heavy machinery while taking prescription pain medicine.   Apply ice or heat as told by your health care provider. These may reduce swelling and pain.   Ask your health care provider if other strategies such as distraction, relaxation, or physical therapies can help your pain.   Keep all follow-up visits as told by your health care provider. This is important.  Contact a health care provider if:   You have pain that is not controlled by medicine.   Your pain does not improve or gets worse.   You have side effects from pain medicines, such as vomitingor confusion.  Get help right away if:   You have severe pain.   You have trouble breathing.   You lose consciousness.   You have chest pain or pressure that lasts for more  than a few minutes. Along with the chest pain you may:  ? Have pain or discomfort in one or both arms, your back, neck, jaw, or stomach.  ? Have shortness of breath.  ? Break out in a cold sweat.  ? Feel nauseous.  ? Become light-headed.  These symptoms may represent a serious problem that is an emergency. Do not wait to see if the symptoms will go away. Get medical help right away. Call your local emergency services (911 in the [place] Do not drive yourself to the hospital.  This information is not intended to replace advice given to you by your health care provider. Make sure you discuss any questions you have with your health care provider.  Document Released: 04/27/2015 Document Revised: 09/19/2015 Document Reviewed: 04/27/2015  Elsevier Interactive Patient Education  2018 [employer]

## 2016-12-28 NOTE — H&P (View-Only) (Signed)
Subjective: Ongoing abdominal pain, maybe slightly better.  Objective: Vital signs in last 24 hours: Temp:  [98.2 F (36.8 C)-98.8 F (37.1 C)] 98.3 F (36.8 C) (09/03 0639) Pulse Rate:  [87-92] 87 (09/03 0639) Resp:  [18-19] 19 (09/03 0639) BP: (131-135)/(69-76) 135/76 (09/03 0639) SpO2:  [100 %] 100 % (09/03 0639) Weight change:  Last BM Date: 12/23/16  PE: GEN:  NAD, overweight ABD:  Soft, protuberant, mild generalized tenderness  Lab Results: CBC    Component Value Date/Time   WBC 10.8 (H) 12/27/2016 0343   RBC 4.64 12/27/2016 0343   HGB 14.2 12/27/2016 0343   HGB 12.8 01/10/2012   HCT 41.8 12/27/2016 0343   PLT 390 12/27/2016 0343   PLT 372 01/10/2012   MCV 90.1 12/27/2016 0343   MCH 30.6 12/27/2016 0343   MCHC 34.0 12/27/2016 0343   RDW 11.9 12/27/2016 0343   LYMPHSABS 1.0 12/23/2016 1740   MONOABS 0.2 12/23/2016 1740   EOSABS 0.0 12/23/2016 1740   BASOSABS 0.0 12/23/2016 1740   CMP     Component Value Date/Time   NA 133 (L) 12/27/2016 0343   K 2.9 (L) 12/27/2016 0343   CL 98 (L) 12/27/2016 0343   CO2 24 12/27/2016 0343   GLUCOSE 208 (H) 12/27/2016 0343   BUN <5 (L) 12/27/2016 0343   CREATININE 0.66 12/27/2016 0343   CREATININE 0.46 (L) 08/04/2015 1406   CALCIUM 8.7 (L) 12/27/2016 0343   PROT 6.2 (L) 12/27/2016 0343   ALBUMIN 3.3 (L) 12/27/2016 0343   AST 13 (L) 12/27/2016 0343   ALT 26 12/27/2016 0343   ALKPHOS 99 12/27/2016 0343   BILITOT 1.5 (H) 12/27/2016 0343   GFRNONAA >60 12/27/2016 0343   GFRAA >60 12/27/2016 0343   Assessment:  1.  Abdominal pain, slight improvement, with negative CT, ultrasound, labs.  DKA can cause this pain, but patient no longer has acidosis.  Gastroparesis can cause pain.  Symptoms seem very atypical for gallbladder or upper GI source.  There seems disconnect between patient's symptoms and objective findings. 2.  Hypokalemia. 3.  Hyperglycemia. 4.  Protein calorie malnutrition.  Plan:  1.  Continue PPI and  sucralfate. 2.  Antiemetics as needed. 3.  Replete potassium. 4.  Once potassium repleted, patient needs CT angiogram abdomen/pelvis, specifically to assess for mesenteric ischemia of either the arterial OR venous systems (prior CT scan not adequate for that). 5.  Once potassium repleted and once/if CT angiogram is unrevealing, would likely pursue endoscopy tomorrow for further evaluation. 6.  Risks (bleeding, infection, bowel perforation that could require surgery, sedation-related changes in cardiopulmonary systems), benefits (identification and possible treatment of source of symptoms, exclusion of certain causes of symptoms), and alternatives (watchful waiting, radiographic imaging studies, empiric medical treatment) of upper endoscopy (EGD) were explained to patient/family in detail and patient wishes to proceed.    Freddy JakschOUTLAW,Ferdinand Revoir M 12/27/2016, 10:48 AM   Cell [phone] If no answer or after 5 PM call [phone]

## 2016-12-28 NOTE — Discharge Summary (Signed)
Physician Discharge Summary  Cheryl Parker SHF:[id number] DOB: [birth year] DOA: 12/24/2016  PCP: Patient, No Pcp Per  Admit date: 12/24/2016 Discharge date: 12/28/2016  Admitted From: Home Disposition: Home   Recommendations for Outpatient Follow-up:  1. Follow up with PCP in 1-2 weeks 2. Please obtain BMP/CBC in one week 3. Follow up BP, started lisinopril 4. Follow up lipid panel in 6 - 8 weeks or more, consider statin therapy if dyslipidemia persists.  5. Monitor CBGs and adjust insulin accordingly  Home Health: None Equipment/Devices: None Discharge Condition: Stable CODE STATUS: Full Diet recommendation: Carb-modified  Brief/Interim Summary: Cheryl Parker is a 36 y.o. G3P3 with a history of GDM with all pregnancies who presented 8/31 with abdominal pain, nausea and vomiting for 3 days. Initial evaluation demonstrated hyperglycemia, ketonuria, and anion-gap acidosis consistent with DKA. RUQ U/S and subsequent CT abd/pelvis were unremarkable. GI was consulted and performed EGD 9/4 which revealed a normal esophagus, mild striped gastric erythema, and mildly inflamed duodenum. Biopsies were taken and discharge was recommended. She has been tolerating a diet with improvement in pain. Blood glucose control has improved, and she will be discharged on PPI, carafate, and with 70/so insulin based on inpatient requirements.   Discharge Diagnoses:  Principal Problem:   DKA (diabetic ketoacidoses) ([place]) Active Problems:   Type 2 diabetes mellitus with hyperglycemia (HCC)   Essential hypertension   Transaminitis   Diabetic ketoacidosis ([place])  DKA: Resolved.  Epigastric pain, nausea, vomiting: Suspect due to DKA, possibly gastroparesis, GERD/PUD. UPT neg, Hep B SAg neg, HIV NR, tylenol neg. CT showed focal fatty infiltration along falciform ligament of the liver, otherwise unremarkable without transaminase elevation. - GI recommends PPI and Carafate as needed, small frequent meals of low  fiber and low residue diet, as abdominal pain may be related to underlying gastroparesis. - Follow up biopsies taken at EGD 12/28/2016.   T1DM: New diagnosis. HbA1c 10%. C-peptide was low at 0.8. Will need insulin therapy.  - Diabetes coordinator assistance appreciated. Pt had A2DM requiring insulin during pregnancies, so she's familiar with this, will plan to DC on 70/30 insulin with requirement calculated from inpatient use.  - Has history of intolerance to metformin.  - Started lisinopril with elevated BP. Monitor BMP.   Dyslipidemia: LDL 124, HDL 26.  - Recommend lifestyle modifications, PCP follow up.  - Despite young age, risk is sufficient to start statin if lipids remain abnormal at follow up.   Aortic atherosclerosis: Noted on CT.  - Risk factor modification as above.   Hypokalemia: From poor per oral intake, improved.  - Check BMP at follow up  Discharge Instructions Discharge Instructions    Diet Carb Modified    Complete by:  As directed    Discharge instructions    Complete by:  As directed    You were admitted for DKA due to new diabetes. You have improved with insulin and will need to continue insulin as below. These prescriptions are printed for you to take to the pharmacy. The EGD showed some inflammation in the stomach and small intestine, the likely cause of your symptoms. Fortunately, these have improved and you are stable for discharge with the following recommendations:  - Continue protonix daily and carafate as needed - You will need to follow up with the gastroenterologist [facility], Dr. Therisa Doyne) as an outpatient to check on your symptoms and the biopsies that were taken today during the EGD.  - Recommend small frequent meals of with low fiber and low carbohydrates.  -  Start taking lisinopril (this was started several days ago to help with blood pressure and protect your kidneys from diabetes).  - If your symptoms return/get worse, seek medical attention.      Allergies as of 12/28/2016   No Known Allergies     Medication List    STOP taking these medications   acetaminophen 500 MG tablet Commonly known as:  TYLENOL   hydrochlorothiazide 25 MG tablet Commonly known as:  HYDRODIURIL   metFORMIN 500 MG 24 hr tablet Commonly known as:  GLUCOPHAGE-XR   TRADJENTA 5 MG Tabs tablet Generic drug:  linagliptin     TAKE these medications   blood glucose meter kit and supplies Dispense based on patient and insurance preference. Check blood sugar in the morning and 3 times daily with meals. (FOR ICD-9 250.00, 250.01).   insulin NPH-regular Human (70-30) 100 UNIT/ML injection Commonly known as:  NOVOLIN 70/30 Inject 13 Units into the skin 2 (two) times daily with a meal.   Insulin Pen Needle 31G X 5 MM Misc for insulin administration twice daily with meals   INSULIN SYRINGE .3CC/31GX5/16" 31G X 5/16" 0.3 ML Misc Use as directed for insulin injection twice daily with meals   lisinopril 5 MG tablet Commonly known as:  PRINIVIL,ZESTRIL Take 1 tablet (5 mg total) by mouth daily.   pantoprazole 20 MG tablet Commonly known as:  PROTONIX Take 1 tablet (20 mg total) by mouth daily.   sucralfate 1 GM/10ML suspension Commonly known as:  CARAFATE Take 10 mLs (1 g total) by mouth 4 (four) times daily -  with meals and at bedtime.            Discharge Care Instructions        Start     Ordered   12/29/16 0000  lisinopril (PRINIVIL,ZESTRIL) 5 MG tablet  Daily     12/28/16 1612   12/28/16 0000  Diet Carb Modified     12/28/16 1612   12/28/16 0000  Discharge instructions    Comments:  You were admitted for DKA due to new diabetes. You have improved with insulin and will need to continue insulin as below. These prescriptions are printed for you to take to the pharmacy. The EGD showed some inflammation in the stomach and small intestine, the likely cause of your symptoms. Fortunately, these have improved and you are stable for discharge  with the following recommendations:  - Continue protonix daily and carafate as needed - You will need to follow up with the gastroenterologist [facility], Dr. Therisa Doyne) as an outpatient to check on your symptoms and the biopsies that were taken today during the EGD.  - Recommend small frequent meals of with low fiber and low carbohydrates.  - Start taking lisinopril (this was started several days ago to help with blood pressure and protect your kidneys from diabetes).  - If your symptoms return/get worse, seek medical attention.   12/28/16 1612   12/28/16 0000  sucralfate (CARAFATE) 1 GM/10ML suspension  3 times daily with meals & bedtime     12/28/16 1612   12/28/16 0000  insulin NPH-regular Human (NOVOLIN 70/30) (70-30) 100 UNIT/ML injection  2 times daily with meals     12/28/16 1612   12/28/16 0000  Insulin Syringe-Needle U-100 (INSULIN SYRINGE .3CC/31GX5/16") 31G X 5/16" 0.3 ML MISC     12/28/16 1612   12/28/16 0000  blood glucose meter kit and supplies    Question Answer Comment  Number of strips 100  Number of lancets 100      12/28/16 1612   12/28/16 0000  Insulin Pen Needle 31G X 5 MM MISC     12/28/16 1612   12/28/16 0000  pantoprazole (PROTONIX) 20 MG tablet  Daily     12/28/16 1613     Follow-up Information    [facility]. Schedule an appointment as soon as possible for a visit.   Contact information: [place] [address] [phone]       Ronnette Juniper, MD. Schedule an appointment as soon as possible for a visit in 2 week(s).   Specialty:  Gastroenterology Contact information: [place] [address] [phone]          No Known Allergies  Consultations: GI Diabetes coordinator  Procedures/Studies: Ct Abdomen Pelvis W Contrast  Result Date: 12/25/2016 CLINICAL DATA:  Nausea and vomiting for 3 days. Mid abdominal pain. New onset diabetes. Transaminitis. EXAM: CT  ABDOMEN AND PELVIS WITH CONTRAST TECHNIQUE: Multidetector CT imaging of the abdomen and pelvis was performed using the standard protocol following bolus administration of intravenous contrast. CONTRAST:  139m ISOVUE-300 IOPAMIDOL (ISOVUE-300) INJECTION 61% COMPARISON:  Right upper quadrant ultrasound 12/23/2016. CT of the abdomen and pelvis 09/07/2014 FINDINGS: Lower chest: The lung bases are clear without focal nodule, mass, or airspace disease. Heart size normal. No significant pleural or pericardial effusion is present. Hepatobiliary: There is focal fatty infiltration along the falciform ligament. No other focal lesions are present. The liver is otherwise unremarkable. The common bile duct and gallbladder are within normal limits. Pancreas: Unremarkable. No pancreatic ductal dilatation or surrounding inflammatory changes. Spleen: Normal in size without focal abnormality. Adrenals/Urinary Tract: The adrenal glands are normal bilaterally. Kidneys and ureters are within normal limits. The urinary bladder is within normal limits. Stomach/Bowel: The stomach and duodenum are within normal limits. The small bowel is unremarkable. The appendix is visualized and normal. The ascending and transverse colon are within normal limits. The descending and sigmoid colon are unremarkable. Vascular/Lymphatic: Atherosclerotic changes are present in the distal aorta without aneurysm or significant stenosis. No significant adenopathy is present. Reproductive: Uterus and bilateral adnexa are unremarkable. Other: No abdominal wall hernia or abnormality. No abdominopelvic ascites. Musculoskeletal: Vertebral body heights and alignment are maintained. No focal lytic or blastic lesions are present. The bony pelvis is within normal limits. The hips are unremarkable. IMPRESSION: 1. No acute or focal lesion to explain the patient's abdominal pain, nausea, or vomiting. 2. Aortic Atherosclerosis (ICD10-I70.0). Changes of atherosclerosis within  the distal aorta without aneurysm or focal stenosis. Electronically Signed   By: CSan MorelleM.D.   On: 12/25/2016 09:30   Ct Angio Abd/pel W/ And/or W/o  Result Date: 12/28/2016 CLINICAL DATA:  36year old female with upper abdominal pain, nausea and vomiting. Evaluate for mesenteric ischemia. EXAM: CT ANGIOGRAPHY ABDOMEN AND PELVIS WITH CONTRAST AND WITHOUT CONTRAST TECHNIQUE: Multidetector CT imaging of the abdomen and pelvis was performed using the standard protocol during bolus administration of intravenous contrast. Multiplanar reconstructed images and MIPs were obtained and reviewed to evaluate the vascular anatomy. CONTRAST:  100 mL Isovue 370 COMPARISON:  CT scan of the abdomen and pelvis 12/25/2016 FINDINGS: VASCULAR Aorta: Mild fibrofatty atherosclerotic plaque. No evidence of aneurysm, dissection or significant stenosis. Celiac: Patent without evidence of aneurysm, dissection, vasculitis or significant stenosis. SMA: Patent without evidence of aneurysm, dissection, vasculitis or significant stenosis. Renals: Both renal arteries are patent without evidence of aneurysm, dissection,  vasculitis, fibromuscular dysplasia or significant stenosis. IMA: Patent without evidence of aneurysm, dissection, vasculitis or significant stenosis. Inflow: Patent without evidence of aneurysm, dissection, vasculitis or significant stenosis. Proximal Outflow: Bilateral common femoral and visualized portions of the superficial and profunda femoral arteries are patent without evidence of aneurysm, dissection, vasculitis or significant stenosis. Veins: Widely patent venous structures. Specifically, the hepatic, portal, and visceral vessels are widely patent as is the inferior vena cava, renal veins and iliac veins. Review of the MIP images confirms the above findings. NON-VASCULAR Lower chest: The contrast column entering the heart is to the left of midline in appears to drain the into a dilated coronary sinus. 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 The heart is otherwise normal in size and appearance. No pericardial effusion. Unremarkable visualized distal thoracic esophagus. The lung bases are clear. Hepatobiliary: Geographic hypoattenuation in the left hemi-liver adjacent to the fissure for the falciform ligament is nonspecific but most suggestive of benign focal fatty infiltration. No definite solid mass or lesion. No intra or extrahepatic biliary ductal dilatation. Normal appearance of the gallbladder. Pancreas: Unremarkable. No pancreatic ductal dilatation or surrounding inflammatory changes. Spleen: Normal in size without focal abnormality. Adrenals/Urinary Tract: Normal adrenal glands. Symmetric bilateral renal perfusion. No hydronephrosis, nephrolithiasis or enhancing renal mass. Circumscribed 6 mm low-attenuation lesion in the lower pole the right kidney is too small for accurate characterization but is statistically highly likely a benign cyst. Stomach/Bowel: No evidence of obstruction or focal bowel wall thickening. Normal appendix in the right lower quadrant. The terminal ileum is unremarkable. Lymphatic: No suspicious lymphadenopathy. Reproductive: Uterus and bilateral adnexa are unremarkable. Other: No abdominal wall hernia or abnormality. No abdominopelvic ascites. Musculoskeletal: No acute or significant osseous findings. IMPRESSION: VASCULAR 1. Mild fibrofatty athero sclerotic plaque along the course of the abdominal aorta without evidence of aneurysm, dissection or significant stenosis. Aortic Atherosclerosis (ICD10-170.0) 2. No evidence of stenosis or other abnormality of the visceral vessels that would suggest an etiology for mesenteric ischemia. 3. Partially imaged cardiac and vascular structures in the chest consistent with a left-sided superior vena cava. NON-VASCULAR 1. No acute abnormality in the abdomen or pelvis. 2. Geographic hypoattenuation in the left  hemi-liver adjacent to the fissure for the falciform ligament is nonspecific but most suggestive of benign focal fatty infiltration. Signed, Criselda Peaches, MD [facility] Electronically Signed   By: Jacqulynn Cadet M.D.   On: 12/28/2016 09:54   US Abdomen Limited Ruq  Result Date: 12/23/2016 CLINICAL DATA:  Bilateral upper quadrant pain with nausea and vomiting for 3 days EXAM: ULTRASOUND ABDOMEN LIMITED RIGHT UPPER QUADRANT COMPARISON:  09/07/2014 FINDINGS: Gallbladder: No gallstones or wall thickening visualized. No sonographic Murphy sign noted by sonographer. Common bile duct: Diameter: 2 mm Liver: No focal lesion identified. Appears with decreased echogenicity. Portal vein is patent on color Doppler imaging with normal direction of blood flow towards the liver. IMPRESSION: 1. Negative for gallstones or biliary dilatation 2. Liver appears hypoechoic, consider hepatitis. Suggest correlation with laboratory values. Electronically Signed   By: Donavan Foil M.D.   On: 12/23/2016 20:00    EGD 12/28/2016: EGD showed a normal esophagus, Z line was regular at 38 cm. Mild striped erythema was noted in the antrum, biopsies were taken to rule out H. pylori infection. Duodenal bulb and first portion of the duodenum appeared mildly inflamed, biopsies taken to rule out celiac disease. Retroflexion revealed a normal cardia and fundus.  Recommend  continuing PPI and Carafate as needed. Biopsies may be followed as an outpatient. Recommend small frequent meals of low fiber and low residue diet, as abdominal pain may be related to underlying gastroparesis. Needs good blood sugar control.  Subjective: Abdominal pain much better over the past 24-48 hrs. Able to eat regularly.   Discharge Exam: Vitals:   12/28/16 1140 12/28/16 1431  BP: 126/81 112/68  Pulse: 85 (!) 106  Resp: 17 20  Temp:  98 F (36.7 C)  SpO2: 100% 99%   General: Pt is  alert, awake, not in acute distress Cardiovascular: RRR, S1/S2 +, no rubs, no gallops Respiratory: CTA bilaterally, no wheezing, no rhonchi Abdominal: Soft, NT, ND, bowel sounds + Extremities: No edema, no cyanosis  Labs: BNP (last 3 results) No results for input(s): BNP in the last 8760 hours. Basic Metabolic Panel:  Recent Labs Lab 12/24/16 1944 12/25/16 0034 12/26/16 0406 12/27/16 0343 12/28/16 0411  NA 135 137 133* 133* 135  K 4.1 4.1 3.1* 2.9* 3.1*  CL 104 110 100* 98* 102  CO2 16* 16* 20* 24 27  GLUCOSE 259* 252* 189* 208* 140*  BUN 10 6 <5* <5* <5*  CREATININE 0.60 0.75 0.72 0.66 0.54  CALCIUM 9.2 8.4* 8.9 8.7* 8.2*  MG  --   --   --   --  1.7   Liver Function Tests:  Recent Labs Lab 12/23/16 2308 12/24/16 1944 12/25/16 0034 12/27/16 0343 12/28/16 0411  AST '24 17 16 ' 13* 18  ALT 66* 52 46 26 19  ALKPHOS 130* 119 105 99 77  BILITOT 0.9 1.4* 1.4*  1.3* 1.5* 0.6  PROT 7.1 6.8 6.4* 6.2* 5.5*  ALBUMIN 3.7 4.1 3.4* 3.3* 3.1*    Recent Labs Lab 12/23/16 1740  LIPASE 23   No results for input(s): AMMONIA in the last 168 hours. CBC:  Recent Labs Lab 12/23/16 1740 12/25/16 0034 12/26/16 0406 12/27/16 0343 12/28/16 0411  WBC 18.9* 11.8* 10.0 10.8* 17.2*  NEUTROABS 17.7*  --   --   --   --   HGB 15.7* 12.6 13.7 14.2 12.3  HCT 45.6 38.3 39.5 41.8 36.0  MCV 93.3 93.6 89.0 90.1 90.5  PLT 383 364 424* 390 412*   Cardiac Enzymes: No results for input(s): CKTOTAL, CKMB, CKMBINDEX, TROPONINI in the last 168 hours. BNP: Invalid input(s): POCBNP CBG:  Recent Labs Lab 12/28/16 0007 12/28/16 0414 12/28/16 0440 12/28/16 0825 12/28/16 1134  GLUCAP 270* 51* 145* 133* 106*   D-Dimer No results for input(s): DDIMER in the last 72 hours. Hgb A1c No results for input(s): HGBA1C in the last 72 hours. Lipid Profile  Recent Labs  12/27/16 0343  CHOL 239*  HDL 26*  LDLCALC 194*  TRIG 96  CHOLHDL 9.2   Thyroid function studies No results for  input(s): TSH, T4TOTAL, T3FREE, THYROIDAB in the last 72 hours.  Invalid input(s): FREET3 Anemia work up No results for input(s): VITAMINB12, FOLATE, FERRITIN, TIBC, IRON, RETICCTPCT in the last 72 hours. Urinalysis    Component Value Date/Time   COLORURINE YELLOW 12/24/2016 1915   APPEARANCEUR HAZY (A) 12/24/2016 1915   LABSPEC 1.025 12/24/2016 1915   PHURINE 5.0 12/24/2016 1915   GLUCOSEU >=500 (A) 12/24/2016 1915   HGBUR SMALL (A) 12/24/2016 1915   BILIRUBINUR NEGATIVE 12/24/2016 1915   KETONESUR 80 (A) 12/24/2016 1915   PROTEINUR 30 (A) 12/24/2016 1915   UROBILINOGEN 0.2 06/23/2015 0852   NITRITE NEGATIVE 12/24/2016 1915   LEUKOCYTESUR MODERATE (A) 12/24/2016 1915  Microbiology No results found for this or any previous visit (from the past 240 hour(s)).  Time coordinating discharge: Approximately 40 minutes  Vance Gather, MD  [facility] 12/28/2016, 4:13 PM Pager [phone]

## 2016-12-28 NOTE — Anesthesia Procedure Notes (Signed)
Procedure Name: MAC Date/Time: 12/28/2016 11:19 AM Performed by: Orlie Dakin Pre-anesthesia Checklist: Patient identified, Emergency Drugs available, Suction available, Patient being monitored and Timeout performed Patient Re-evaluated:Patient Re-evaluated prior to induction Oxygen Delivery Method: Nasal cannula Preoxygenation: Pre-oxygenation with 100% oxygen

## 2016-12-28 NOTE — Progress Notes (Signed)
Pt for discharge today going home, health teachings in diabetes, accucheck, insulin administration, next appointment, removed the peripheral IV line, given all her personal belongings, no complain of distress noted, to be pick up by her father.

## 2016-12-28 NOTE — Anesthesia Preprocedure Evaluation (Signed)
Anesthesia Evaluation  Patient identified by MRN, date of birth, ID band Patient awake    Reviewed: Allergy & Precautions, H&P , NPO status , Patient's Chart, lab work & pertinent test results  History of Anesthesia Complications (+) history of anesthetic complications  Airway Mallampati: III  TM Distance: >3 FB Neck ROM: Full    Dental no notable dental hx. (+) Teeth Intact   Pulmonary neg pulmonary ROS, former smoker,    Pulmonary exam normal breath sounds clear to auscultation       Cardiovascular hypertension, negative cardio ROS Normal cardiovascular exam Rhythm:Regular Rate:Normal     Neuro/Psych  Headaches, negative psych ROS   GI/Hepatic (+)     substance abuse  marijuana use,   Endo/Other  diabetes, Poorly Controlled, Gestational, Insulin DependentObesity  Renal/GU negative Renal ROS     Musculoskeletal   Abdominal   Peds  Hematology negative hematology ROS (+)   Anesthesia Other Findings   Reproductive/Obstetrics                             Anesthesia Physical  Anesthesia Plan  ASA: II  Anesthesia Plan: MAC   Post-op Pain Management:    Induction:   PONV Risk Score and Plan: 2 and Ondansetron and Propofol infusion  Airway Management Planned:   Additional Equipment:   Intra-op Plan:   Post-operative Plan:   Informed Consent: I have reviewed the patients History and Physical, chart, labs and discussed the procedure including the risks, benefits and alternatives for the proposed anesthesia with the patient or authorized representative who has indicated his/her understanding and acceptance.     Plan Discussed with: CRNA  Anesthesia Plan Comments:         Anesthesia Quick Evaluation

## 2016-12-28 NOTE — Progress Notes (Signed)
Inpatient Diabetes Program Recommendations  AACE/ADA: New Consensus Statement on Inpatient Glycemic Control (2015)  Target Ranges:  Prepandial:   less than 140 mg/dL      Peak postprandial:   less than 180 mg/dL (1-2 hours)      Critically ill patients:  140 - 180 mg/dL   Lab Results  Component Value Date   GLUCAP 145 (H) 12/28/2016   HGBA1C 9.8 (H) 12/25/2016     Diabetes history: New onset Outpatient Diabetes medications: None Current orders for Inpatient glycemic control: Lantus 18 units qd + Novolog correction moderate 0-15 units q 4 hrs.  Inpatient Diabetes Program Recommendations:   Low blood sugar this am likely as a result of Novolog moderate correction.  Consider decreasing Novolog correction to sensitive correction q4h.   Susette RacerJulie Ronique Simerly, RN, BA, MHA, CDE Diabetes Coordinator Inpatient Diabetes Program  [phone] (Team Pager) [phone] [facility] Office) 12/28/2016 8:18 AM

## 2016-12-28 NOTE — Interval H&P Note (Signed)
History and Physical Interval Note: 35/African American female with upper abdominal pain, unremarkable CT angiogram/venogram, scheduled for EGD for unexplained abdominal pain. 12/28/2016 11:15 AM  Cheryl Parker  has presented today for EGD, with the diagnosis of abdominal pain  The various methods of treatment have been discussed with the patient and family. After consideration of risks, benefits and other options for treatment, the patient has consented to  Procedure(s): ESOPHAGOGASTRODUODENOSCOPY (EGD) WITH PROPOFOL (Left) as a surgical intervention .  The patient's history has been reviewed, patient examined, no change in status, stable for surgery.  I have reviewed the patient's chart and labs.  Questions were answered to the patient's satisfaction.     Kerin SalenArya Lamonda Noxon

## 2016-12-28 NOTE — Brief Op Note (Signed)
12/24/2016 - 12/28/2016  11:32 AM  PATIENT:  Cheryl Parker  36 y.o. female  PRE-OPERATIVE DIAGNOSIS:  abdominal pain  POST-OPERATIVE DIAGNOSIS:  mild gastritis  PROCEDURE:  Procedure(s): ESOPHAGOGASTRODUODENOSCOPY (EGD) WITH PROPOFOL (Left)  SURGEON:  Surgeon(s) and Role:    Ronnette Juniper, MD - Primary  PHYSICIAN ASSISTANT:   ASSISTANTS: none   ANESTHESIA:   MAC  EBL:  Total I/O In: 100 [I.V.:100] Out: -   BLOOD ADMINISTERED:none  DRAINS: none   LOCAL MEDICATIONS USED:  NONE  SPECIMEN:  Biopsy / Limited Resection  DISPOSITION OF SPECIMEN:  PATHOLOGY  COUNTS:  YES  TOURNIQUET:  * No tourniquets in log *  DICTATION: .Dragon Dictation  PLAN OF CARE: Admit to inpatient   PATIENT DISPOSITION:  PACU - hemodynamically stable.   Delay start of Pharmacological VTE agent (>24hrs) due to surgical blood loss or risk of bleeding: no

## 2016-12-28 NOTE — Care Management Note (Signed)
Case Management Note  Patient Details  Name: Cheryl Parker MRN: [id number] Date of Birth: [birth year]  Subjective/Objective:                    Action/Plan:  Confirmed patient is uninsured . Medicaid expired . Patient would like to speak with financial counselor, called spoke with Sheralyn Boatmanoni. MATCH letter given dated 12-28-16 to 01-04-17 explained to patient. Patient voiced understanding.  Expected Discharge Date:                  Expected Discharge Plan:  Home/Self Care  In-House Referral:  Financial Counselor  Discharge planning Services  CM Consult, MATCH Program, Medication Assistance, Indigent Health Clinic  Post Acute Care Choice:    Choice offered to:  Patient  DME Arranged:    DME Agency:     HH Arranged:    HH Agency:     Status of Service:  Completed, signed off  If discussed at [employer] Meetings, dates discussed:    Additional Comments:  Kingsley PlanWile, Alyanah Elliott Marie, RN 12/28/2016, 1:05 PM

## 2016-12-28 NOTE — Progress Notes (Signed)
NUTRITION NOTE  RD consulted for nutrition education regarding diabetes.   Lab Results  Component Value Date   HGBA1C 9.8 (H) 12/25/2016    RD provided "Carbohydrate Counting for People with Diabetes" handout from the Academy of Nutrition and Dietetics. Discussed different food groups and their effects on blood sugar, emphasizing carbohydrate-containing foods. Provided list of carbohydrates and recommended serving sizes of common foods.  Discussed importance of controlled and consistent carbohydrate intake throughout the day. Provided examples of ways to balance meals/snacks and encouraged intake of high-fiber, whole grain complex carbohydrates. Teach back method used.  Pt reports that she had GDM with all of her pregnancies, most recent was last year. She reports having education during that time but is interested in a "refresher of this information." Talked with pt about label reading and also about making "trade-offs" with foods so that she is able to enjoy the foods she would like to have at meals without going over her carbohydrate limit. She is very appreciative of information. She had endoscopy earlier today and GI recommends low fiber diet; also talked with pt about this and gave examples of options that are better and items that may not be the most appropriate. Also talked with her about small, frequent meals throughout the day to aid in gastric emptying and avoidance of discomfort related to delayed emptying.  Expect good compliance.  Body mass index is 25.79 kg/m. Pt meets criteria for overweight based on current BMI.  Current diet order is Soft diet. Labs and medications reviewed. No further nutrition interventions warranted at this time. RD contact information provided. If additional nutrition issues arise, please re-consult RD.    Trenton GammonJessica Kelsy Polack, MS, RD, LDN, [facility] Inpatient Clinical Dietitian Pager # [phone] After hours/weekend pager # [phone]

## 2016-12-28 NOTE — Transfer of Care (Signed)
Immediate Anesthesia Transfer of Care Note  Patient: Cheryl Parker  Procedure(s) Performed: Procedure(s): ESOPHAGOGASTRODUODENOSCOPY (EGD) WITH PROPOFOL (Left)  Patient Location: Endoscopy Unit  Anesthesia Type:MAC  Level of Consciousness: awake, oriented and patient cooperative  Airway & Oxygen Therapy: Patient Spontanous Breathing and Patient connected to nasal cannula oxygen  Post-op Assessment: Report given to RN, Post -op Vital signs reviewed and stable and Patient moving all extremities X 4  Post vital signs: Reviewed and stable  Last Vitals:  Vitals:   12/28/16 1050 12/28/16 1129  BP: 101/68 107/70  Pulse: 99 (!) 104  Resp: 10 13  Temp: 36.9 C 36.7 C  SpO2: 100% 100%    Last Pain:  Vitals:   12/28/16 1129  TempSrc: Oral  PainSc:       Patients Stated Pain Goal: 2 (93/57/01 7793)  Complications: No apparent anesthesia complications

## 2016-12-28 NOTE — Op Note (Signed)
EGD showed a normal esophagus, Z line was regular at 38 cm. Mild striped erythema was noted in the antrum, biopsies were taken to rule out H. pylori infection. Duodenal bulb and first portion of the duodenum appeared mildly inflamed, biopsies taken to rule out celiac disease. Retroflexion revealed a normal cardia and fundus.   Recommend continuing PPI and Carafate as needed. Biopsies may be followed as an outpatient. Recommend small frequent meals of low fiber and low residue diet, as abdominal pain may be related to underlying gastroparesis. Needs good blood sugar control.  Okay to DC home from GI standpoint.  Kerin SalenArya Aarish Rockers, M.D. [phone]

## 2016-12-29 ENCOUNTER — Encounter (HOSPITAL_COMMUNITY): Payer: Self-pay | Admitting: Gastroenterology

## 2016-12-29 NOTE — Anesthesia Postprocedure Evaluation (Signed)
Anesthesia Post Note  Patient: Cheryl Parker  Procedure(s) Performed: Procedure(s) (LRB): ESOPHAGOGASTRODUODENOSCOPY (EGD) WITH PROPOFOL (Left)     Patient location during evaluation: PACU Anesthesia Type: MAC Level of consciousness: awake and alert Pain management: pain level controlled Vital Signs Assessment: post-procedure vital signs reviewed and stable Respiratory status: spontaneous breathing Cardiovascular status: stable Anesthetic complications: no    Last Vitals:  Vitals:   12/28/16 1140 12/28/16 1431  BP: 126/81 112/68  Pulse: 85 (!) 106  Resp: 17 20  Temp:  36.7 C  SpO2: 100% 99%    Last Pain:  Vitals:   12/28/16 1611  TempSrc:   PainSc: 0-No pain                 Nolon Nations

## 2017-06-01 ENCOUNTER — Encounter (HOSPITAL_COMMUNITY): Payer: Self-pay | Admitting: Emergency Medicine

## 2017-06-01 ENCOUNTER — Inpatient Hospital Stay (HOSPITAL_COMMUNITY)
Admission: EM | Admit: 2017-06-01 | Discharge: 2017-06-06 | DRG: 639 | Disposition: A | Discharge status: Home / Self Care | Payer: Self-pay | Attending: Internal Medicine | Admitting: Internal Medicine

## 2017-06-01 DIAGNOSIS — Z87891 Personal history of nicotine dependence: Secondary | ICD-10-CM

## 2017-06-01 DIAGNOSIS — K7689 Other specified diseases of liver: Secondary | ICD-10-CM

## 2017-06-01 DIAGNOSIS — T383X6A Underdosing of insulin and oral hypoglycemic [antidiabetic] drugs, initial encounter: Secondary | ICD-10-CM | POA: Diagnosis present

## 2017-06-01 DIAGNOSIS — R0789 Other chest pain: Secondary | ICD-10-CM | POA: Diagnosis present

## 2017-06-01 DIAGNOSIS — I10 Essential (primary) hypertension: Secondary | ICD-10-CM | POA: Diagnosis present

## 2017-06-01 DIAGNOSIS — Y92009 Unspecified place in unspecified non-institutional (private) residence as the place of occurrence of the external cause: Secondary | ICD-10-CM

## 2017-06-01 DIAGNOSIS — R112 Nausea with vomiting, unspecified: Secondary | ICD-10-CM

## 2017-06-01 DIAGNOSIS — Z9114 Patient's other noncompliance with medication regimen: Secondary | ICD-10-CM

## 2017-06-01 DIAGNOSIS — E1010 Type 1 diabetes mellitus with ketoacidosis without coma: Principal | ICD-10-CM | POA: Diagnosis present

## 2017-06-01 DIAGNOSIS — Z794 Long term (current) use of insulin: Secondary | ICD-10-CM

## 2017-06-01 DIAGNOSIS — E1159 Type 2 diabetes mellitus with other circulatory complications: Secondary | ICD-10-CM | POA: Diagnosis present

## 2017-06-01 DIAGNOSIS — B9681 Helicobacter pylori [H. pylori] as the cause of diseases classified elsewhere: Secondary | ICD-10-CM | POA: Diagnosis present

## 2017-06-01 DIAGNOSIS — Z91120 Patient's intentional underdosing of medication regimen due to financial hardship: Secondary | ICD-10-CM

## 2017-06-01 DIAGNOSIS — Z833 Family history of diabetes mellitus: Secondary | ICD-10-CM

## 2017-06-01 DIAGNOSIS — E1110 Type 2 diabetes mellitus with ketoacidosis without coma: Secondary | ICD-10-CM | POA: Diagnosis present

## 2017-06-01 DIAGNOSIS — R945 Abnormal results of liver function studies: Secondary | ICD-10-CM

## 2017-06-01 HISTORY — DX: Gastro-esophageal reflux disease without esophagitis: K21.9

## 2017-06-01 HISTORY — DX: Type 1 diabetes mellitus without complications: E10.9

## 2017-06-01 LAB — URINALYSIS, ROUTINE W REFLEX MICROSCOPIC
Bacteria, UA: NONE SEEN
Bilirubin Urine: NEGATIVE
Glucose, UA: >=500 mg/dL — AB
HGB URINE DIPSTICK: NEGATIVE
Ketones, ur: 80 mg/dL — AB
Leukocytes, UA: NEGATIVE
Nitrite: NEGATIVE
Protein, ur: 30 mg/dL — AB
SPECIFIC GRAVITY, URINE: 1.033 — AB (ref 1.005–1.030)
pH: 7 (ref 5.0–8.0)

## 2017-06-01 LAB — RAPID URINE DRUG SCREEN, HOSP PERFORMED
Amphetamines: NOT DETECTED
Barbiturates: NOT DETECTED
Benzodiazepines: NOT DETECTED
COCAINE: NOT DETECTED
OPIATES: NOT DETECTED
TETRAHYDROCANNABINOL: POSITIVE — AB

## 2017-06-01 LAB — CBC WITH DIFFERENTIAL/PLATELET
Basophils Absolute: 0 10*3/uL (ref 0.0–0.1)
Basophils Relative: 0 %
Eosinophils Absolute: 0 10*3/uL (ref 0.0–0.7)
Eosinophils Relative: 0 %
HCT: 43 % (ref 36.0–46.0)
HEMOGLOBIN: 14.6 g/dL (ref 12.0–15.0)
LYMPHS ABS: 1.1 10*3/uL (ref 0.7–4.0)
LYMPHS PCT: 6 %
MCH: 31.5 pg (ref 26.0–34.0)
MCHC: 34 g/dL (ref 30.0–36.0)
MCV: 92.9 fL (ref 78.0–100.0)
Monocytes Absolute: 0.2 10*3/uL (ref 0.1–1.0)
Monocytes Relative: 1 %
Neutro Abs: 16.6 10*3/uL — ABNORMAL HIGH (ref 1.7–7.7)
Neutrophils Relative %: 93 %
Platelets: 451 10*3/uL — ABNORMAL HIGH (ref 150–400)
RBC: 4.63 MIL/uL (ref 3.87–5.11)
RDW: 12.4 % (ref 11.5–15.5)
WBC: 17.9 10*3/uL — AB (ref 4.0–10.5)

## 2017-06-01 LAB — I-STAT VENOUS BLOOD GAS, ED
Acid-base deficit: 8 mmol/L — ABNORMAL HIGH (ref 0.0–2.0)
Bicarbonate: 18.3 mmol/L — ABNORMAL LOW (ref 20.0–28.0)
O2 Saturation: 61 %
PCO2 VEN: 38.1 mmHg — AB (ref 44.0–60.0)
PO2 VEN: 35 mmHg (ref 32.0–45.0)
TCO2: 19 mmol/L — ABNORMAL LOW (ref 22–32)
pH, Ven: 7.289 (ref 7.250–7.430)

## 2017-06-01 LAB — COMPREHENSIVE METABOLIC PANEL
ALK PHOS: 158 U/L — AB (ref 38–126)
ALT: 124 U/L — AB (ref 14–54)
AST: 53 U/L — ABNORMAL HIGH (ref 15–41)
Albumin: 4.4 g/dL (ref 3.5–5.0)
Anion gap: 19 — ABNORMAL HIGH (ref 5–15)
BILIRUBIN TOTAL: 0.7 mg/dL (ref 0.3–1.2)
BUN: 8 mg/dL (ref 6–20)
CALCIUM: 9.9 mg/dL (ref 8.9–10.3)
CO2: 14 mmol/L — ABNORMAL LOW (ref 22–32)
CREATININE: 0.65 mg/dL (ref 0.44–1.00)
Chloride: 105 mmol/L (ref 101–111)
Glucose, Bld: 311 mg/dL — ABNORMAL HIGH (ref 65–99)
Potassium: 4.1 mmol/L (ref 3.5–5.1)
Sodium: 138 mmol/L (ref 135–145)
TOTAL PROTEIN: 7.6 g/dL (ref 6.5–8.1)

## 2017-06-01 LAB — I-STAT BETA HCG BLOOD, ED (MC, WL, AP ONLY): I-stat hCG, quantitative: <5 m[IU]/mL (ref <5)

## 2017-06-01 LAB — LIPASE, BLOOD: LIPASE: 31 U/L (ref 11–51)

## 2017-06-01 LAB — CBG MONITORING, ED
GLUCOSE-CAPILLARY: 296 mg/dL — AB (ref 65–99)
Glucose-Capillary: 260 mg/dL — ABNORMAL HIGH (ref 65–99)

## 2017-06-01 MED ORDER — SUCRALFATE 1 GM/10ML PO SUSP
1.0000 g | Freq: Three times a day (TID) | ORAL | Status: DC
  Administered 2017-06-02 – 2017-06-06 (×16): 1 g via ORAL
  Filled 2017-06-01 (×20): qty 10

## 2017-06-01 MED ORDER — SODIUM CHLORIDE 0.9 % IV SOLN
INTRAVENOUS | Status: DC
  Administered 2017-06-01: 2 [IU]/h via INTRAVENOUS
  Administered 2017-06-03: 1.1 [IU]/h via INTRAVENOUS
  Filled 2017-06-01 (×2): qty 1

## 2017-06-01 MED ORDER — LORAZEPAM 2 MG/ML IJ SOLN
0.5000 mg | Freq: Once | INTRAMUSCULAR | Status: AC
  Administered 2017-06-01: 0.5 mg via INTRAVENOUS
  Filled 2017-06-01: qty 1

## 2017-06-01 MED ORDER — ONDANSETRON HCL 4 MG/2ML IJ SOLN
4.0000 mg | Freq: Once | INTRAMUSCULAR | Status: AC
  Administered 2017-06-01: 4 mg via INTRAVENOUS
  Filled 2017-06-01: qty 2

## 2017-06-01 MED ORDER — ONDANSETRON 4 MG PO TBDP
4.0000 mg | ORAL_TABLET | Freq: Once | ORAL | Status: AC
  Administered 2017-06-01: 4 mg via ORAL
  Filled 2017-06-01: qty 1

## 2017-06-01 MED ORDER — SODIUM CHLORIDE 0.9 % IV SOLN
INTRAVENOUS | Status: DC
  Filled 2017-06-01: qty 1

## 2017-06-01 MED ORDER — POTASSIUM CHLORIDE 10 MEQ/100ML IV SOLN
10.0000 meq | INTRAVENOUS | Status: DC
  Filled 2017-06-01: qty 100

## 2017-06-01 MED ORDER — SODIUM CHLORIDE 0.9 % IV BOLUS (SEPSIS): 1000.0000 mL | Freq: Once | INTRAVENOUS | Status: DC

## 2017-06-01 MED ORDER — FAMOTIDINE IN NACL 20-0.9 MG/50ML-% IV SOLN
20.0000 mg | Freq: Once | INTRAVENOUS | Status: AC
  Administered 2017-06-01: 20 mg via INTRAVENOUS
  Filled 2017-06-01: qty 50

## 2017-06-01 MED ORDER — PANTOPRAZOLE SODIUM 20 MG PO TBEC
20.0000 mg | DELAYED_RELEASE_TABLET | Freq: Every day | ORAL | Status: DC
  Administered 2017-06-03 – 2017-06-04 (×2): 20 mg via ORAL
  Filled 2017-06-01 (×2): qty 1

## 2017-06-01 MED ORDER — SODIUM CHLORIDE 0.9 % IV SOLN
INTRAVENOUS | Status: AC
  Administered 2017-06-01: 23:00:00 via INTRAVENOUS

## 2017-06-01 MED ORDER — SODIUM CHLORIDE 0.9 % IV BOLUS (SEPSIS)
1000.0000 mL | Freq: Once | INTRAVENOUS | Status: AC
  Administered 2017-06-01: 1000 mL via INTRAVENOUS

## 2017-06-01 MED ORDER — ONDANSETRON HCL 4 MG/2ML IJ SOLN
4.0000 mg | Freq: Four times a day (QID) | INTRAMUSCULAR | Status: DC | PRN
  Administered 2017-06-02 – 2017-06-05 (×6): 4 mg via INTRAVENOUS
  Filled 2017-06-01 (×7): qty 2

## 2017-06-01 MED ORDER — POTASSIUM CHLORIDE 10 MEQ/100ML IV SOLN: 10.0000 meq | INTRAVENOUS | Status: DC

## 2017-06-01 MED ORDER — DEXTROSE-NACL 5-0.45 % IV SOLN
INTRAVENOUS | Status: DC
  Administered 2017-06-02: via INTRAVENOUS

## 2017-06-01 MED ORDER — ENOXAPARIN SODIUM 40 MG/0.4ML ~~LOC~~ SOLN
40.0000 mg | SUBCUTANEOUS | Status: DC
  Administered 2017-06-03 – 2017-06-06 (×4): 40 mg via SUBCUTANEOUS
  Filled 2017-06-01 (×5): qty 0.4

## 2017-06-01 MED ORDER — LISINOPRIL 5 MG PO TABS
5.0000 mg | ORAL_TABLET | Freq: Every day | ORAL | Status: DC
  Administered 2017-06-03 – 2017-06-06 (×4): 5 mg via ORAL
  Filled 2017-06-01 (×4): qty 1

## 2017-06-01 MED ORDER — DEXTROSE-NACL 5-0.45 % IV SOLN: INTRAVENOUS | Status: DC

## 2017-06-01 MED ORDER — SODIUM CHLORIDE 0.9 % IV SOLN
INTRAVENOUS | Status: DC
  Administered 2017-06-02 (×2): via INTRAVENOUS

## 2017-06-01 MED ORDER — POTASSIUM CHLORIDE 10 MEQ/100ML IV SOLN
10.0000 meq | INTRAVENOUS | Status: AC
  Administered 2017-06-01 – 2017-06-02 (×2): 10 meq via INTRAVENOUS
  Filled 2017-06-01: qty 100

## 2017-06-01 NOTE — ED Provider Notes (Signed)
[facility] EMERGENCY DEPARTMENT Provider Note   CSN: [id number] Arrival date & time: 06/01/17  1900     History   Chief Complaint Chief Complaint  Patient presents with  . Emesis  . Abdominal Pain    HPI Cheryl Parker is a 37 y.o. female.  Patient with hx iddm, c/o nausea and vomiting several times today. Emesis is clear to yellowish, not bloody or bilious. +epigastric pain, dull, moderate, constant, non radiating. Having normal bms. No prior abd surgery. No hx gallstones, pancreatitis, or pud. No back or flank pain. No dysuria or hematuria. No cough or uri c/o. No chest pain or sob. No fever or chills.    The history is provided by the patient.  Emesis   Associated symptoms include abdominal pain. Pertinent negatives include no chills, no fever and no headaches.  Abdominal Pain   Associated symptoms include vomiting. Pertinent negatives include fever, dysuria and headaches.    Past Medical History:  Diagnosis Date  . 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 DKA (diabetic ketoacidoses) ([place]) 09/10/2014  . Gestational diabetes   . Headache   . Hypertension   . Pregnancy induced hypertension     Patient Active Problem List   Diagnosis Date Noted  . Diabetic ketoacidosis ([place]) 12/25/2016  . Transaminitis 12/24/2016  . Essential hypertension 08/04/2015  . Status post repeat low transverse cesarean section 06/25/2015  . Marijuana use 01/08/2015  . Dehydration with hyponatremia 11/16/2014  . HLD (hyperlipidemia)   . Type 2 diabetes mellitus with hyperglycemia ([place])   . DKA (diabetic ketoacidoses) ([place]) 09/10/2014    Past Surgical History:  Procedure Laterality Date  . CESAREAN SECTION    . CESAREAN SECTION N/A 07/05/2012   Procedure: CESAREAN SECTION;  Surgeon: Mora Bellman, MD;  Location: [facility];  Service: Obstetrics;  Laterality: N/A;  . CESAREAN SECTION N/A 06/25/2015   Procedure: CESAREAN SECTION;  Surgeon: Mora Bellman, MD;  Location: [facility];  Service: Obstetrics;  Laterality: N/A;  . ESOPHAGOGASTRODUODENOSCOPY (EGD) WITH PROPOFOL Left 12/28/2016   Procedure: ESOPHAGOGASTRODUODENOSCOPY (EGD) WITH PROPOFOL;  Surgeon: Ronnette Juniper, MD;  Location: [facility];  Service: Gastroenterology;  Laterality: Left;    OB History    Gravida Para Term Preterm AB Living   '3 3 3 ' 0 0 3   SAB TAB Ectopic Multiple Live Births   0 0 0 0 3       Home Medications    Prior to Admission medications   Medication Sig Start Date End Date Taking? Authorizing Provider  blood glucose meter kit and supplies Dispense based on patient and insurance preference. Check blood sugar in the morning and 3 times daily with meals. (FOR ICD-9 250.00, 250.01). 12/28/16   Patrecia Pour, MD  insulin NPH-regular Human (NOVOLIN 70/30) (70-30) 100 UNIT/ML injection Inject 13 Units into the skin 2 (two) times daily with a meal. 12/28/16   Patrecia Pour, MD  Insulin Pen Needle 31G X 5 MM MISC for insulin administration twice daily with meals 12/28/16   Patrecia Pour, MD  Insulin Syringe-Needle U-100 (INSULIN SYRINGE .3CC/31GX5/16") 31G X 5/16" 0.3 ML MISC Use as directed for insulin injection twice daily with meals 12/28/16   Patrecia Pour, MD  lisinopril (PRINIVIL,ZESTRIL) 5 MG tablet Take 1 tablet (5 mg total) by mouth daily. 12/29/16   Patrecia Pour, MD  pantoprazole (PROTONIX) 20 MG tablet Take 1 tablet (20 mg total) by mouth daily. 12/28/16  Patrecia Pour, MD  sucralfate (CARAFATE) 1 GM/10ML suspension Take 10 mLs (1 g total) by mouth 4 (four) times daily -  with meals and at bedtime. 12/28/16   Patrecia Pour, MD    Family History Family History  Problem Relation Age of Onset  . Diabetes Mother   . Kidney disease Father   . Birth defects Son        unilateral renal agenesis    Social History Social History   Tobacco Use  . Smoking status: Former Research scientist (life sciences)  . Smokeless tobacco: Never Used  Substance Use Topics  . Alcohol use: No  . Drug use:  No     Allergies   Patient has no known allergies.   Review of Systems Review of Systems  Constitutional: Negative for chills and fever.  HENT: Negative for sore throat.   Eyes: Negative for redness.  Respiratory: Negative for shortness of breath.   Cardiovascular: Negative for chest pain.  Gastrointestinal: Positive for abdominal pain and vomiting.  Endocrine: Positive for polyuria.  Genitourinary: Negative for dysuria and flank pain.  Musculoskeletal: Negative for back pain and neck pain.  Skin: Negative for rash.  Neurological: Negative for headaches.  Hematological: Does not bruise/bleed easily.  Psychiatric/Behavioral: Negative for confusion.     Physical Exam Updated Vital Signs Pulse (!) 103   Temp 98.5 F (36.9 C) (Oral)   Resp 18   Ht 1.575 m ('5\' 2"' )   Wt 72.6 kg (160 lb)   SpO2 100%   BMI 29.26 kg/m   Physical Exam  Constitutional: She appears well-developed and well-nourished. No distress.  HENT:  Head: Atraumatic.  Eyes: Conjunctivae are normal. No scleral icterus.  Neck: Neck supple. No tracheal deviation present.  Cardiovascular: Regular rhythm, normal heart sounds and intact distal pulses. Exam reveals no gallop and no friction rub.  No murmur heard. Pulmonary/Chest: Effort normal and breath sounds normal. No respiratory distress.  Abdominal: Soft. Normal appearance and bowel sounds are normal. She exhibits no distension. There is tenderness. There is no guarding.  Mild epigastric tenderness.   Genitourinary:  Genitourinary Comments: No cva tenderness  Musculoskeletal: She exhibits no edema.  Neurological: She is alert.  Skin: Skin is warm and dry. No rash noted.  Psychiatric:  Mildly anxious.   Nursing note and vitals reviewed.    ED Treatments / Results  Labs (all labs ordered are listed, but only abnormal results are displayed) Results for orders placed or performed during the hospital encounter of 06/01/17  Urinalysis, Routine w  reflex microscopic  Result Value Ref Range   Color, Urine YELLOW YELLOW   APPearance CLEAR CLEAR   Specific Gravity, Urine 1.033 (H) 1.005 - 1.030   pH 7.0 5.0 - 8.0   Glucose, UA >=500 (A) NEGATIVE mg/dL   Hgb urine dipstick NEGATIVE NEGATIVE   Bilirubin Urine NEGATIVE NEGATIVE   Ketones, ur 80 (A) NEGATIVE mg/dL   Protein, ur 30 (A) NEGATIVE mg/dL   Nitrite NEGATIVE NEGATIVE   Leukocytes, UA NEGATIVE NEGATIVE   RBC / HPF 0-5 0 - 5 RBC/hpf   WBC, UA 0-5 0 - 5 WBC/hpf   Bacteria, UA NONE SEEN NONE SEEN   Squamous Epithelial / LPF 0-5 (A) NONE SEEN  CBG monitoring, ED  Result Value Ref Range   Glucose-Capillary 296 (H) 65 - 99 mg/dL  I-Stat Beta hCG blood, ED ([facility], [facility], [facility] only)  Result Value Ref Range   I-stat hCG, quantitative <5.0 <5 mIU/mL   Comment 3  EKG  EKG Interpretation None       Radiology No results found.  Procedures Procedures (including critical care time)  Medications Ordered in ED Medications  sodium chloride 0.9 % bolus 1,000 mL (not administered)  ondansetron (ZOFRAN) injection 4 mg (not administered)  ondansetron (ZOFRAN-ODT) disintegrating tablet 4 mg (4 mg Oral Given 06/01/17 1947)     Initial Impression / Assessment and Plan / ED Course  I have reviewed the triage vital signs and the nursing notes.  Pertinent labs & imaging results that were available during my care of the patient were reviewed by me and considered in my medical decision making (see chart for details).  Iv ns bolus. zofran iv. pepcid iv. Ativan .5 mg iv. Labs.  Reviewed nursing notes and prior charts for additional history.   Additional ns iv.   Chemistries pending.  Signed out to oncoming provider, K Humes to check chemistries/hco3 when back - I suspect pt has dka and will need glucostabilizer and admission.    Final Clinical Impressions(s) / ED Diagnoses   Final diagnoses:  None    ED Discharge Orders    None       Lajean Saver, MD 06/01/17  2212

## 2017-06-01 NOTE — ED Provider Notes (Signed)
10:50 PM Patient care assumed from Dr. Denton LankSteinl at change of shift.  Patient presenting for nausea and vomiting with onset today.  Patient reassessed.  She states that she is feeling less nauseous, but still unwell.  She has a generally nontender abdomen.  She states that she has not had any of her diabetic medications since December 20th.  She appears to be in DKA at this time.  Sugars in the 300s with bicarb of 14 and elevated anion gap to 19.  She has positive ketonuria.  Will add additional bolus of IV fluids and order insulin drip.  Plan for admission to unassigned service.  11:00 PM Case [facility].   CRITICAL CARE Performed by: Antony MaduraKelly Kerrilynn Derenzo   Total critical care time: 35 minutes  Critical care time was exclusive of separately billable procedures and treating other patients.  Critical care was necessary to treat or prevent imminent or life-threatening deterioration.  Critical care was time spent personally by me on the following activities: development of treatment plan with patient and/or surrogate as well as nursing, discussions with consultants, evaluation of patient's response to treatment, examination of patient, obtaining history from patient or surrogate, ordering and performing treatments and interventions, ordering and review of laboratory studies, ordering and review of radiographic studies, pulse oximetry and re-evaluation of patient's condition.   Medications  insulin regular (NOVOLIN R,HUMULIN R) 100 Units in sodium chloride 0.9 % 100 mL (1 Units/mL) infusion (not administered)  dextrose 5 %-0.45 % sodium chloride infusion (not administered)  potassium chloride 10 mEq in 100 mL IVPB (not administered)  ondansetron (ZOFRAN-ODT) disintegrating tablet 4 mg (4 mg Oral Given 06/01/17 1947)  sodium chloride 0.9 % bolus 1,000 mL (0 mLs Intravenous Stopped 06/01/17 2250)  ondansetron (ZOFRAN) injection 4 mg (4 mg Intravenous Given 06/01/17 2136)   LORazepam (ATIVAN) injection 0.5 mg (0.5 mg Intravenous Given 06/01/17 2137)  famotidine (PEPCID) IVPB 20 mg premix (0 mg Intravenous Stopped 06/01/17 2208)    Results for orders placed or performed during the hospital encounter of 06/01/17  CBC with Differential  Result Value Ref Range   WBC 17.9 (H) 4.0 - 10.5 K/uL   RBC 4.63 3.87 - 5.11 MIL/uL   Hemoglobin 14.6 12.0 - 15.0 g/dL   HCT 47.843.0 29.536.0 - 62.146.0 %   MCV 92.9 78.0 - 100.0 fL   MCH 31.5 26.0 - 34.0 pg   MCHC 34.0 30.0 - 36.0 g/dL   RDW 30.812.4 65.711.5 - 84.615.5 %   Platelets 451 (H) 150 - 400 K/uL   Neutrophils Relative % 93 %   Neutro Abs 16.6 (H) 1.7 - 7.7 K/uL   Lymphocytes Relative 6 %   Lymphs Abs 1.1 0.7 - 4.0 K/uL   Monocytes Relative 1 %   Monocytes Absolute 0.2 0.1 - 1.0 K/uL   Eosinophils Relative 0 %   Eosinophils Absolute 0.0 0.0 - 0.7 K/uL   Basophils Relative 0 %   Basophils Absolute 0.0 0.0 - 0.1 K/uL  Comprehensive metabolic panel  Result Value Ref Range   Sodium 138 135 - 145 mmol/L   Potassium 4.1 3.5 - 5.1 mmol/L   Chloride 105 101 - 111 mmol/L   CO2 14 (L) 22 - 32 mmol/L   Glucose, Bld 311 (H) 65 - 99 mg/dL   BUN 8 6 - 20 mg/dL   Creatinine, Ser 9.620.65 0.44 - 1.00 mg/dL   Calcium 9.9 8.9 - 95.210.3 mg/dL   Total Protein 7.6 6.5 -  8.1 g/dL   Albumin 4.4 3.5 - 5.0 g/dL   AST 53 (H) 15 - 41 U/L   ALT 124 (H) 14 - 54 U/L   Alkaline Phosphatase 158 (H) 38 - 126 U/L   Total Bilirubin 0.7 0.3 - 1.2 mg/dL   GFR calc non Af Amer >60 >60 mL/min   GFR calc Af Amer >60 >60 mL/min   Anion gap 19 (H) 5 - 15  Lipase, blood  Result Value Ref Range   Lipase 31 11 - 51 U/L  Urinalysis, Routine w reflex microscopic  Result Value Ref Range   Color, Urine YELLOW YELLOW   APPearance CLEAR CLEAR   Specific Gravity, Urine 1.033 (H) 1.005 - 1.030   pH 7.0 5.0 - 8.0   Glucose, UA >=500 (A) NEGATIVE mg/dL   Hgb urine dipstick NEGATIVE NEGATIVE   Bilirubin Urine NEGATIVE NEGATIVE   Ketones, ur 80 (A) NEGATIVE mg/dL   Protein,  ur 30 (A) NEGATIVE mg/dL   Nitrite NEGATIVE NEGATIVE   Leukocytes, UA NEGATIVE NEGATIVE   RBC / HPF 0-5 0 - 5 RBC/hpf   WBC, UA 0-5 0 - 5 WBC/hpf   Bacteria, UA NONE SEEN NONE SEEN   Squamous Epithelial / LPF 0-5 (A) NONE SEEN  CBG monitoring, ED  Result Value Ref Range   Glucose-Capillary 296 (H) 65 - 99 mg/dL  I-Stat Beta hCG blood, ED ([facility], [facility], [facility] only)  Result Value Ref Range   I-stat hCG, quantitative <5.0 <5 mIU/mL   Comment 3          CBG monitoring, ED  Result Value Ref Range   Glucose-Capillary 260 (H) 65 - 99 mg/dL     Antony Madura, PA-C 06/01/17 2303    Jacalyn Lefevre, MD 06/01/17 2308

## 2017-06-01 NOTE — ED Notes (Signed)
Pt did not reply when called to be roomed.   

## 2017-06-01 NOTE — ED Triage Notes (Signed)
Pt brought back to triage by NF, pt was informed it would be "several minutes" before being triaged so pt went outside and called 911 from the front. Pt reports abd cramping with N/V since this afternoon. Pt is dry heaving in triage, will not sit still

## 2017-06-01 NOTE — H&P (Addendum)
TRH H&P   Patient Demographics:    Cheryl Parker, is a 37 y.o. female  MRN: [id number]   DOB - [birth year]  Admit Date - 06/01/2017  Outpatient Primary MD for the patient is Patient, No Pcp Per  Referring MD/NP/PA: Lajean Saver, Antonietta Breach  Outpatient Specialists:    Patient coming from: home  Chief Complaint  Patient presents with  . Emesis  . Abdominal Pain      HPI:    Cheryl Parker  is a 37 y.o. female,  w hypertension, dm1, h/o dka apparently was recently discharged but couldn't get her medication. She went to [facility] and they wouldn't give her medication because she didn't have money.   Pt presents today due to c/o epigastric disomfort , and n/v.   In Ed, pt tachycardic,   Urinalysis  + ketones  Wbc 17.9, Hgb 14.6, Plt 451 Na 138, K 4.1, Bun 8, Creatinine 0.65 Ast 53, Alt 124, Alk phos 158, T, bili 0.7   Lipase 31  Pt will be admitted for w/up of DKA. , abnormal liver function.        Review of systems:    In addition to the HPI above, No Fever-chills, No Headache, No changes with Vision or hearing, No problems swallowing food or Liquids, No Chest pain, Cough or Shortness of Breath,  No Blood in stool or Urine, No dysuria, No new skin rashes or bruises, No new joints pains-aches,  No new weakness, tingling, numbness in any extremity, No recent weight gain or loss, No polyuria, polydypsia or polyphagia, No significant Mental Stressors.  A full 10 point Review of Systems was done, except as stated above, all other Review of Systems were negative.   With Past History of the following :    Past Medical History:  Diagnosis Date  . 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 DKA (diabetic ketoacidoses) ([place]) 09/10/2014  . Gestational diabetes   . Headache   . Hypertension   . Pregnancy induced hypertension       Past  Surgical History:  Procedure Laterality Date  . CESAREAN SECTION    . CESAREAN SECTION N/A 07/05/2012   Procedure: CESAREAN SECTION;  Surgeon: Mora Bellman, MD;  Location: [facility];  Service: Obstetrics;  Laterality: N/A;  . CESAREAN SECTION N/A 06/25/2015   Procedure: CESAREAN SECTION;  Surgeon: Mora Bellman, MD;  Location: [facility];  Service: Obstetrics;  Laterality: N/A;  . ESOPHAGOGASTRODUODENOSCOPY (EGD) WITH PROPOFOL Left 12/28/2016   Procedure: ESOPHAGOGASTRODUODENOSCOPY (EGD) WITH PROPOFOL;  Surgeon: Ronnette Juniper, MD;  Location: [facility];  Service: Gastroenterology;  Laterality: Left;      Social History:     Social History   Tobacco Use  . Smoking status: Former Research scientist (life sciences)  . Smokeless tobacco: Never Used  Substance Use Topics  . Alcohol use: No     Lives - at  home  Mobility - walks by self   Family History :     Family History  Problem Relation Age of Onset  . Diabetes Mother   . Kidney disease Father   . Birth defects Son        unilateral renal agenesis      Home Medications:   Prior to Admission medications   Medication Sig Start Date End Date Taking? Authorizing Provider  blood glucose meter kit and supplies Dispense based on patient and insurance preference. Check blood sugar in the morning and 3 times daily with meals. (FOR ICD-9 250.00, 250.01). 12/28/16   Cheryl Pour, MD  insulin NPH-regular Human (NOVOLIN 70/30) (70-30) 100 UNIT/ML injection Inject 13 Units into the skin 2 (two) times daily with a meal. 12/28/16   Cheryl Pour, MD  Insulin Pen Needle 31G X 5 MM MISC for insulin administration twice daily with meals 12/28/16   Cheryl Pour, MD  Insulin Syringe-Needle U-100 (INSULIN SYRINGE .3CC/31GX5/16") 31G X 5/16" 0.3 ML MISC Use as directed for insulin injection twice daily with meals 12/28/16   Cheryl Pour, MD  lisinopril (PRINIVIL,ZESTRIL) 5 MG tablet Take 1 tablet (5 mg total) by mouth daily. 12/29/16   Cheryl Pour, MD  pantoprazole (PROTONIX) 20 MG  tablet Take 1 tablet (20 mg total) by mouth daily. 12/28/16   Cheryl Pour, MD  sucralfate (CARAFATE) 1 GM/10ML suspension Take 10 mLs (1 g total) by mouth 4 (four) times daily -  with meals and at bedtime. 12/28/16   Cheryl Pour, MD     Allergies:    No Known Allergies   Physical Exam:   Vitals  Pulse (!) 103, temperature 98.5 F (36.9 C), temperature source Oral, resp. rate 18, height '5\' 2"'  (1.575 m), weight 72.6 kg (160 lb), SpO2 100 %, not currently breastfeeding.   1. General  lying in bed in NAD,    2. Normal affect and insight, Not Suicidal or Homicidal, Awake Alert, Oriented X 3.  3. No F.N deficits, ALL C.Nerves Intact, Strength 5/5 all 4 extremities, Sensation intact all 4 extremities, Plantars down going.  4. Ears and Eyes appear Normal, Conjunctivae clear, PERRLA. Moist Oral Mucosa.  5. Supple Neck, No JVD, No cervical lymphadenopathy appriciated, No Carotid Bruits.  6. Symmetrical Chest wall movement, Good air movement bilaterally, CTAB.  7. Tachycardic s1, s2, , No Gallops, Rubs or Murmurs, No Parasternal Heave.  8. Obese, Positive Bowel Sounds, Abdomen Soft, No tenderness, No organomegaly appriciated,No rebound -guarding or rigidity.  9.  No Cyanosis, Normal Skin Turgor, No Skin Rash or Bruise.  10. Good muscle tone,  joints appear normal , no effusions, Normal ROM.  11. No Palpable Lymph Nodes in Neck or Axillae    Data Review:    CBC Recent Labs  Lab 06/01/17 1935  WBC 17.9*  HGB 14.6  HCT 43.0  PLT 451*  MCV 92.9  MCH 31.5  MCHC 34.0  RDW 12.4  LYMPHSABS 1.1  MONOABS 0.2  EOSABS 0.0  BASOSABS 0.0   ------------------------------------------------------------------------------------------------------------------  Chemistries  Recent Labs  Lab 06/01/17 1935  NA 138  K 4.1  CL 105  CO2 14*  GLUCOSE 311*  BUN 8  CREATININE 0.65  CALCIUM 9.9  AST 53*  ALT 124*  ALKPHOS 158*  BILITOT 0.7    ------------------------------------------------------------------------------------------------------------------ estimated creatinine clearance is 89.8 mL/min (by C-G formula based on SCr of 0.65 mg/dL). ------------------------------------------------------------------------------------------------------------------ No results for input(s): TSH, T4TOTAL, T3FREE, THYROIDAB  in the last 72 hours.  Invalid input(s): FREET3  Coagulation profile No results for input(s): INR, PROTIME in the last 168 hours. ------------------------------------------------------------------------------------------------------------------- No results for input(s): DDIMER in the last 72 hours. -------------------------------------------------------------------------------------------------------------------  Cardiac Enzymes No results for input(s): CKMB, TROPONINI, MYOGLOBIN in the last 168 hours.  Invalid input(s): CK ------------------------------------------------------------------------------------------------------------------ No results found for: BNP   ---------------------------------------------------------------------------------------------------------------  Urinalysis    Component Value Date/Time   COLORURINE YELLOW 06/01/2017 1935   APPEARANCEUR CLEAR 06/01/2017 1935   LABSPEC 1.033 (H) 06/01/2017 1935   PHURINE 7.0 06/01/2017 1935   GLUCOSEU >=500 (A) 06/01/2017 1935   HGBUR NEGATIVE 06/01/2017 1935   BILIRUBINUR NEGATIVE 06/01/2017 1935   KETONESUR 80 (A) 06/01/2017 1935   PROTEINUR 30 (A) 06/01/2017 1935   UROBILINOGEN 0.2 06/23/2015 0852   NITRITE NEGATIVE 06/01/2017 1935   LEUKOCYTESUR NEGATIVE 06/01/2017 1935    ----------------------------------------------------------------------------------------------------------------   Imaging Results:    No results found.    Assessment & Plan:    Principal Problem:   DKA (diabetic ketoacidoses) ([place]) Active Problems:    Essential hypertension   Abnormal liver function   DKA Npo Ns iv Insulin iv Bmp q4h  Abnormal liver function Acute hepatitis panel RUQ ultrasound  Nausea/ vomitting zofran iv  Epigastric discomfort protonix sucralfate Morphine prn   Addendum Pt c/o chest discomfort after n/v Tele Trop iq 6h x3 D dimer If positive then CTA chest CXR Cardiac echo         DVT Prophylaxis  Lovenox - SCDs  AM Labs Ordered, also please review Full Orders  Family Communication: Admission, patients condition and plan of care including tests being ordered have been discussed with the patient who indicate understanding and agree with the plan and Code Status.  Code Status FULL CODE  Likely DC to  home  Condition GUARDED   Consults called: none  Admission status: inpatient   Time spent in minutes : 45   Jani Gravel M.D on 06/01/2017 at 11:05 PM  Between 7am to 7pm - Pager [phone]. After 7pm go to [email or web address] - password [facility]  [facility] - Office  [phone]

## 2017-06-01 NOTE — ED Provider Notes (Signed)
Patient placed in Quick Look pathway, seen and evaluated   Chief Complaint: abd pain  HPI:   abd pain since 4am, dry heaving, hx of DM.  No recent sickness.  Denies drug or alcohol abuse   ROS: no cp, sob, dysuria.    Physical Exam:   Gen: No distress  Neuro: Awake and Alert  Skin: Warm    Focused Exam: appears uncomfortable, dry heaving, mild diffused abd tenderness.    Initiation of care has begun. The patient has been counseled on the process, plan, and necessity for staying for the completion/evaluation, and the remainder of the medical screening examination    Fayrene Helperran, Chana Lindstrom, Cordelia Poche-C 06/01/17 1919    Margarita Grizzleay, Danielle, MD 06/04/17 1228

## 2017-06-01 NOTE — ED Triage Notes (Signed)
Hx of uncontrolled diabetes

## 2017-06-02 ENCOUNTER — Inpatient Hospital Stay (HOSPITAL_COMMUNITY): Payer: Self-pay

## 2017-06-02 ENCOUNTER — Encounter (HOSPITAL_COMMUNITY): Payer: Self-pay | Admitting: General Practice

## 2017-06-02 ENCOUNTER — Other Ambulatory Visit: Payer: Self-pay

## 2017-06-02 DIAGNOSIS — R112 Nausea with vomiting, unspecified: Secondary | ICD-10-CM

## 2017-06-02 DIAGNOSIS — I10 Essential (primary) hypertension: Secondary | ICD-10-CM

## 2017-06-02 LAB — CBG MONITORING, ED
GLUCOSE-CAPILLARY: 132 mg/dL — AB (ref 65–99)
GLUCOSE-CAPILLARY: 172 mg/dL — AB (ref 65–99)
GLUCOSE-CAPILLARY: 180 mg/dL — AB (ref 65–99)
GLUCOSE-CAPILLARY: 184 mg/dL — AB (ref 65–99)
GLUCOSE-CAPILLARY: 259 mg/dL — AB (ref 65–99)
Glucose-Capillary: 217 mg/dL — ABNORMAL HIGH (ref 65–99)
Glucose-Capillary: 157 mg/dL — ABNORMAL HIGH (ref 65–99)
Glucose-Capillary: 147 mg/dL — ABNORMAL HIGH (ref 65–99)
Glucose-Capillary: 172 mg/dL — ABNORMAL HIGH (ref 65–99)
Glucose-Capillary: 157 mg/dL — ABNORMAL HIGH (ref 65–99)
Glucose-Capillary: 180 mg/dL — ABNORMAL HIGH (ref 65–99)
Glucose-Capillary: 175 mg/dL — ABNORMAL HIGH (ref 65–99)
Glucose-Capillary: 116 mg/dL — ABNORMAL HIGH (ref 65–99)

## 2017-06-02 LAB — BASIC METABOLIC PANEL
ANION GAP: 16 — AB (ref 5–15)
Anion gap: 12 (ref 5–15)
Anion gap: 14 (ref 5–15)
Anion gap: 13 (ref 5–15)
BUN: 9 mg/dL (ref 6–20)
BUN: 5 mg/dL — AB (ref 6–20)
CHLORIDE: 104 mmol/L (ref 101–111)
CHLORIDE: 107 mmol/L (ref 101–111)
CHLORIDE: 107 mmol/L (ref 101–111)
CO2: 17 mmol/L — ABNORMAL LOW (ref 22–32)
CO2: 19 mmol/L — ABNORMAL LOW (ref 22–32)
CO2: 17 mmol/L — ABNORMAL LOW (ref 22–32)
CO2: 21 mmol/L — ABNORMAL LOW (ref 22–32)
CREATININE: 0.56 mg/dL (ref 0.44–1.00)
CREATININE: 0.55 mg/dL (ref 0.44–1.00)
Calcium: 9 mg/dL (ref 8.9–10.3)
Calcium: 8.7 mg/dL — ABNORMAL LOW (ref 8.9–10.3)
Calcium: 8.6 mg/dL — ABNORMAL LOW (ref 8.9–10.3)
Calcium: 8.3 mg/dL — ABNORMAL LOW (ref 8.9–10.3)
Chloride: 102 mmol/L (ref 101–111)
Creatinine, Ser: 0.65 mg/dL (ref 0.44–1.00)
Creatinine, Ser: 0.62 mg/dL (ref 0.44–1.00)
Glucose, Bld: 255 mg/dL — ABNORMAL HIGH (ref 65–99)
Glucose, Bld: 219 mg/dL — ABNORMAL HIGH (ref 65–99)
Glucose, Bld: 205 mg/dL — ABNORMAL HIGH (ref 65–99)
Glucose, Bld: 150 mg/dL — ABNORMAL HIGH (ref 65–99)
POTASSIUM: 2.9 mmol/L — AB (ref 3.5–5.1)
POTASSIUM: 3.3 mmol/L — AB (ref 3.5–5.1)
POTASSIUM: 3.7 mmol/L (ref 3.5–5.1)
POTASSIUM: 3.9 mmol/L (ref 3.5–5.1)
SODIUM: 135 mmol/L (ref 135–145)
SODIUM: 136 mmol/L (ref 135–145)
SODIUM: 140 mmol/L (ref 135–145)
Sodium: 138 mmol/L (ref 135–145)

## 2017-06-02 LAB — HEMOGLOBIN A1C
Hgb A1c MFr Bld: 9.3 % — ABNORMAL HIGH (ref 4.8–5.6)
Mean Plasma Glucose: 220.21 mg/dL

## 2017-06-02 LAB — TROPONIN I

## 2017-06-02 LAB — GLUCOSE, CAPILLARY
GLUCOSE-CAPILLARY: 116 mg/dL — AB (ref 65–99)
GLUCOSE-CAPILLARY: 192 mg/dL — AB (ref 65–99)
Glucose-Capillary: 149 mg/dL — ABNORMAL HIGH (ref 65–99)
Glucose-Capillary: 164 mg/dL — ABNORMAL HIGH (ref 65–99)
Glucose-Capillary: 202 mg/dL — ABNORMAL HIGH (ref 65–99)
Glucose-Capillary: 206 mg/dL — ABNORMAL HIGH (ref 65–99)

## 2017-06-02 LAB — D-DIMER, QUANTITATIVE (NOT AT ARMC)

## 2017-06-02 LAB — MRSA PCR SCREENING: MRSA BY PCR: NEGATIVE

## 2017-06-02 MED ORDER — LORAZEPAM 2 MG/ML IJ SOLN
1.0000 mg | Freq: Once | INTRAMUSCULAR | Status: AC
  Administered 2017-06-02: 1 mg via INTRAVENOUS
  Filled 2017-06-02: qty 1

## 2017-06-02 MED ORDER — ENSURE ENLIVE PO LIQD
237.0000 mL | Freq: Two times a day (BID) | ORAL | Status: DC
  Administered 2017-06-04 – 2017-06-05 (×3): 237 mL via ORAL

## 2017-06-02 MED ORDER — INSULIN GLARGINE 100 UNIT/ML ~~LOC~~ SOLN
10.0000 [IU] | Freq: Every day | SUBCUTANEOUS | Status: DC
  Administered 2017-06-02 – 2017-06-06 (×5): 10 [IU] via SUBCUTANEOUS
  Filled 2017-06-02 (×6): qty 0.1

## 2017-06-02 MED ORDER — PROMETHAZINE HCL 25 MG/ML IJ SOLN
25.0000 mg | Freq: Once | INTRAMUSCULAR | Status: AC
  Administered 2017-06-02: 25 mg via INTRAVENOUS
  Filled 2017-06-02: qty 1

## 2017-06-02 MED ORDER — INSULIN ASPART 100 UNIT/ML ~~LOC~~ SOLN
0.0000 [IU] | SUBCUTANEOUS | Status: DC
  Administered 2017-06-02: 3 [IU] via SUBCUTANEOUS
  Administered 2017-06-03: 2 [IU] via SUBCUTANEOUS
  Administered 2017-06-03: 3 [IU] via SUBCUTANEOUS
  Administered 2017-06-04: 2 [IU] via SUBCUTANEOUS
  Administered 2017-06-04: 3 [IU] via SUBCUTANEOUS
  Administered 2017-06-04 (×4): 2 [IU] via SUBCUTANEOUS
  Administered 2017-06-05: 3 [IU] via SUBCUTANEOUS
  Administered 2017-06-05: 2 [IU] via SUBCUTANEOUS
  Administered 2017-06-05: 5 [IU] via SUBCUTANEOUS
  Administered 2017-06-05: 3 [IU] via SUBCUTANEOUS
  Administered 2017-06-06: 2 [IU] via SUBCUTANEOUS
  Administered 2017-06-06: 3 [IU] via SUBCUTANEOUS
  Filled 2017-06-02: qty 1

## 2017-06-02 MED ORDER — PROMETHAZINE HCL 25 MG/ML IJ SOLN
12.5000 mg | Freq: Once | INTRAMUSCULAR | Status: AC
  Administered 2017-06-02: 12.5 mg via INTRAVENOUS
  Filled 2017-06-02: qty 1

## 2017-06-02 MED ORDER — MORPHINE SULFATE (PF) 4 MG/ML IV SOLN
1.0000 mg | INTRAVENOUS | Status: AC | PRN
  Administered 2017-06-02 (×3): 1 mg via INTRAVENOUS
  Filled 2017-06-02 (×3): qty 1

## 2017-06-02 MED ORDER — SODIUM CHLORIDE 0.9 % IV SOLN
INTRAVENOUS | Status: DC
  Administered 2017-06-02: 12:00:00 via INTRAVENOUS

## 2017-06-02 MED ORDER — INSULIN ASPART 100 UNIT/ML ~~LOC~~ SOLN: 2.0000 [IU] | SUBCUTANEOUS | Status: DC

## 2017-06-02 MED ORDER — HYOSCYAMINE SULFATE 0.5 MG/ML IJ SOLN
0.2500 mg | Freq: Once | INTRAMUSCULAR | Status: AC
  Administered 2017-06-02: 0.25 mg via INTRAVENOUS
  Filled 2017-06-02: qty 0.5

## 2017-06-02 MED ORDER — DEXTROSE-NACL 5-0.45 % IV SOLN
INTRAVENOUS | Status: DC
  Administered 2017-06-03: 75 mL/h via INTRAVENOUS

## 2017-06-02 MED ORDER — POTASSIUM CHLORIDE CRYS ER 20 MEQ PO TBCR
40.0000 meq | EXTENDED_RELEASE_TABLET | Freq: Two times a day (BID) | ORAL | Status: AC
  Administered 2017-06-02: 40 meq via ORAL
  Filled 2017-06-02: qty 2

## 2017-06-02 NOTE — Progress Notes (Signed)
PROGRESS NOTE  Cheryl Parker ZOX:[id number] DOB: 08/21/1980 DOA: 06/01/2017 PCP: Patient, No Pcp Per  HPI/Recap of past 24 hours: 37 yr old female with PMH of HTN, DM type 1, uncontrolled, recently discharged but came back due to generalized abdominal pain, nausea and vomiting. Pt has been out of her medications since 12/18 and hasnt been able to afford her medications. In the ED, pt was found to be in DKA with abnormal liver function, admitted for further management.  Today, pt still reported nausea, vomiting, with mild abdominal pain. Denies any fever/chills, chest pain, SOB.  Assessment/Plan: Principal Problem:   DKA (diabetic ketoacidoses) (HCC) Active Problems:   Essential hypertension   Abnormal liver function   DKA with hx of Type 1 DM A1c 9.3, uncontrolled HD stable, afebrile, with leukocytosis On glucostabilizer, plan to transition to [place] insulin once anion gap closes Frequent BMP, IVF, correct K prn, zofran Advanced diet to full liquid Case manager on board for help with medication  Chest pain Atypical, resolved Trop neg, EKG with no acute changes D-dimer neg CXR neg Monitor closely  Abnormal liver fxn Acute hepatitis panel pending RUQ USS- neg Repeat in am  Leukocytosis Likely reactive, daily cbc    Code Status: Full  Family Communication: None at bedside  Disposition Plan: TBD   Consultants:  None  Procedures:  None  Antimicrobials:  None  DVT prophylaxis:  Lovenox   Objective: Vitals:   06/02/17 0530 06/02/17 0600 06/02/17 0800 06/02/17 1715  BP:   (!) 141/75 138/80  Pulse: (!) 102 (!) 105 (!) 106 (!) 114  Resp: (!) 24 15 (!) 8   Temp:    98.3 F (36.8 C)  TempSrc:    Oral  SpO2: 99% 100% 100% 100%  Weight:    70 kg (154 lb 5.2 oz)  Height:    5\' 2"  (1.575 m)    Intake/Output Summary (Last 24 hours) at 06/02/2017 1814 Last data filed at 06/02/2017 1802 Gross per 24 hour  Intake 5499.47 ml  Output 200 ml  Net 5299.47 ml    Filed Weights   06/01/17 1914 06/02/17 1715  Weight: 72.6 kg (160 lb) 70 kg (154 lb 5.2 oz)    Exam:   General: Alert, awake, NAD    Cardiovascular: S1, S2 present, no added hrt sound  Respiratory: Chest clear bilaterally   Abdomen: Soft, non-tender on palpation, non-distended, BS present  Musculoskeletal: No pedal edema bilaterally  Skin: Normal  Psychiatry: Normal mood   Data Reviewed: CBC: Recent Labs  Lab 06/01/17 1935  WBC 17.9*  NEUTROABS 16.6*  HGB 14.6  HCT 43.0  MCV 92.9  PLT 451*   Basic Metabolic Panel: Recent Labs  Lab 06/01/17 1935 06/01/17 2330 06/02/17 0246 06/02/17 1126  NA 138 140 138 135  K 4.1 3.9 3.7 3.3*  CL 105 107 107 104  CO2 14* 17* 19* 17*  GLUCOSE 311* 255* 219* 205*  BUN 8 9 5* <5*  CREATININE 0.65 0.65 0.62 0.56  CALCIUM 9.9 9.0 8.7* 8.6*   GFR: Estimated Creatinine Clearance: 88.3 mL/min (by C-G formula based on SCr of 0.56 mg/dL). Liver Function Tests: Recent Labs  Lab 06/01/17 1935  AST 53*  ALT 124*  ALKPHOS 158*  BILITOT 0.7  PROT 7.6  ALBUMIN 4.4   Recent Labs  Lab 06/01/17 1935  LIPASE 31   No results for input(s): AMMONIA in the last 168 hours. Coagulation Profile: No results for input(s): INR, PROTIME in the last 168 hours.  Cardiac Enzymes: Recent Labs  Lab 06/02/17 0109 06/02/17 1126  TROPONINI <0.03 <0.03   BNP (last 3 results) No results for input(s): PROBNP in the last 8760 hours. HbA1C: Recent Labs    06/02/17 1126  HGBA1C 9.3*   CBG: Recent Labs  Lab 06/02/17 1036 06/02/17 1138 06/02/17 1247 06/02/17 1413 06/02/17 1524  GLUCAP 180* 175* 172* 184* 116*   Lipid Profile: No results for input(s): CHOL, HDL, LDLCALC, TRIG, CHOLHDL, LDLDIRECT in the last 72 hours. Thyroid Function Tests: No results for input(s): TSH, T4TOTAL, FREET4, T3FREE, THYROIDAB in the last 72 hours. Anemia Panel: No results for input(s): VITAMINB12, FOLATE, FERRITIN, TIBC, IRON, RETICCTPCT in the last  72 hours. Urine analysis:    Component Value Date/Time   COLORURINE YELLOW 06/01/2017 1935   APPEARANCEUR CLEAR 06/01/2017 1935   LABSPEC 1.033 (H) 06/01/2017 1935   PHURINE 7.0 06/01/2017 1935   GLUCOSEU >=500 (A) 06/01/2017 1935   HGBUR NEGATIVE 06/01/2017 1935   BILIRUBINUR NEGATIVE 06/01/2017 1935   KETONESUR 80 (A) 06/01/2017 1935   PROTEINUR 30 (A) 06/01/2017 1935   UROBILINOGEN 0.2 06/23/2015 0852   NITRITE NEGATIVE 06/01/2017 1935   LEUKOCYTESUR NEGATIVE 06/01/2017 1935   Sepsis Labs: @LABRCNTIP (procalcitonin:4,lacticidven:4)  )No results found for this or any previous visit (from the past 240 hour(s)).    Studies: Dg Chest Port 1 View  Result Date: 06/02/2017 CLINICAL DATA:  Nausea and vomiting EXAM: PORTABLE CHEST 1 VIEW COMPARISON:  09/27/2005 FINDINGS: The heart size and mediastinal contours are within normal limits. Both lungs are clear. The visualized skeletal structures are unremarkable. IMPRESSION: No active disease. Electronically Signed   By: Jasmine PangKim  Fujinaga M.D.   On: 06/02/2017 00:39   [place] Abdomen Limited Ruq  Result Date: 06/02/2017 CLINICAL DATA:  Initial evaluation for abnormal LFTs. EXAM: ULTRASOUND ABDOMEN LIMITED RIGHT UPPER QUADRANT COMPARISON:  Prior CT from 12/27/2016. FINDINGS: Gallbladder: No gallstones or wall thickening visualized. No sonographic Murphy sign noted by sonographer. Common bile duct: Diameter: 4.2 mm Liver: No focal lesion identified. Within normal limits in parenchymal echogenicity. Portal vein is patent on color Doppler imaging with normal direction of blood flow towards the liver. IMPRESSION: Normal right upper quadrant ultrasound. Electronically Signed   By: Rise MuBenjamin  McClintock M.D.   On: 06/02/2017 02:03    Scheduled Meds: . enoxaparin (LOVENOX) injection  40 mg Subcutaneous Q24H  . insulin aspart  0-15 Units Subcutaneous Q4H  . insulin glargine  10 Units Subcutaneous Daily  . lisinopril  5 mg Oral Daily  . pantoprazole  20 mg Oral  Daily  . sucralfate  1 g Oral TID WC & HS    Continuous Infusions: . dextrose 5 % and 0.45% NaCl 75 mL/hr at 06/02/17 1809  . insulin (NOVOLIN-R) infusion 0.6 Units/hr (06/02/17 1715)     LOS: 1 day     Briant CedarNkeiruka J Ezenduka, MD [facility]   If 7PM-7AM, please contact night-coverage [email or web address] Password [email or web address] 06/02/2017, 6:14 PM

## 2017-06-02 NOTE — ED Notes (Signed)
Diabetes coordinator bedside for education

## 2017-06-02 NOTE — ED Notes (Signed)
Checked patient blood sugar it was 180 notified RN BONNIE OF BLOOD SUGAR PATIENT IS RESTING WITH CALL BELL IN REACH

## 2017-06-02 NOTE — Progress Notes (Signed)
Spoke with patient briefly about her insulin and lack of insurance. States that she has not taken any insulin since December, 2018. States that she had been to [facility] to get insulin, but couldn't afford. Patient did not want to talk much. The last order for insulin was Humulin 70/30 13 units BID at discharge. Will need to follow up with patient when on floor.   Smith MinceKendra Enrika Aguado RN BSN CDE Diabetes Coordinator Pager: [phone]  8am-5pm

## 2017-06-02 NOTE — ED Notes (Signed)
Checked patient blood sugar it was 180 notified Bonnie of blood sugar

## 2017-06-02 NOTE — Care Management Note (Signed)
Case Management Note  Patient Details  Name: Cheryl Parker MRN: [id number] Date of Birth: 10-15-1980  Subjective/Objective:                  37 y.o. female uninsured patient with hx iddm, c/o nausea and vomiting. From home.   Action/Plan: Admit status INPATIENT (DKA); anticipate discharge HOME WITH SELF CARE.   Expected Discharge Date:                  Expected Discharge Plan:  Home/Self Care  In-House Referral:  Financial Counselor  Discharge planning Services  CM Consult  Post Acute Care Choice:    Choice offered to:     DME Arranged:    DME Agency:     HH Arranged:    HH Agency:     Status of Service:     If discussed at [employer] Length of [employer] Meetings, dates discussed:    Additional Comments: Cheryl Parker J. Cheryl RoersWood, RN, BSN, Apache CorporationCM [phone] Pt has used 30 day free trial card and medication assistance program [facility](MATCH) within the last year.    Pt given brochure with refill assistance card intact.  [facility].  Pt verbalizes importance of filling medication upon discharge.   Cheryl CohnWood, Cheryl Bribiesca, RN 06/02/2017, 10:46 AM

## 2017-06-02 NOTE — ED Notes (Signed)
Patient transported to Ultrasound 

## 2017-06-02 NOTE — ED Notes (Signed)
Pt given emesis bag.

## 2017-06-02 NOTE — ED Notes (Signed)
Hooked patient back up to the monitor gave patient a warm blanket patient is resting with call bell in reach

## 2017-06-02 NOTE — ED Notes (Signed)
Gave pt a couple of pieces of ice chips, per Aundra MilletMegan - Charity fundraiserN.

## 2017-06-02 NOTE — ED Notes (Signed)
Pt's CBG result was 116. Informed Megan - RN.

## 2017-06-02 NOTE — ED Notes (Signed)
Patient ambulated to restroom without assistance. Gait slow, steady. No distress noted.  

## 2017-06-02 NOTE — ED Notes (Addendum)
Pt reports sudden onset of L sided CP, sharp & radiating to L arm. V/S remain stable and WNL. Pt seems anxious. Complained previously of mild abdominal pain, nausea. Denies SOB. Obtained ECG and [facility] in Pod C. Informed Selena BattenKim, MD.

## 2017-06-03 ENCOUNTER — Encounter (HOSPITAL_COMMUNITY): Payer: Self-pay | Admitting: Radiology

## 2017-06-03 ENCOUNTER — Inpatient Hospital Stay (HOSPITAL_COMMUNITY): Payer: Self-pay

## 2017-06-03 LAB — BASIC METABOLIC PANEL
ANION GAP: 13 (ref 5–15)
ANION GAP: 12 (ref 5–15)
BUN: <5 mg/dL — ABNORMAL LOW (ref 6–20)
BUN: <5 mg/dL — ABNORMAL LOW (ref 6–20)
CALCIUM: 8.9 mg/dL (ref 8.9–10.3)
CALCIUM: 9.1 mg/dL (ref 8.9–10.3)
CHLORIDE: 101 mmol/L (ref 101–111)
CO2: 22 mmol/L (ref 22–32)
CO2: 23 mmol/L (ref 22–32)
CREATININE: 0.53 mg/dL (ref 0.44–1.00)
CREATININE: 0.56 mg/dL (ref 0.44–1.00)
Chloride: 102 mmol/L (ref 101–111)
GFR calc non Af Amer: >60 mL/min (ref >60)
GFR calc non Af Amer: >60 mL/min (ref >60)
GLUCOSE: 166 mg/dL — AB (ref 65–99)
Glucose, Bld: 154 mg/dL — ABNORMAL HIGH (ref 65–99)
Potassium: 3.1 mmol/L — ABNORMAL LOW (ref 3.5–5.1)
Potassium: 3.1 mmol/L — ABNORMAL LOW (ref 3.5–5.1)
Sodium: 137 mmol/L (ref 135–145)
Sodium: 136 mmol/L (ref 135–145)

## 2017-06-03 LAB — CBC WITH DIFFERENTIAL/PLATELET
BASOS PCT: 0 %
Basophils Absolute: 0 10*3/uL (ref 0.0–0.1)
Eosinophils Absolute: 0 10*3/uL (ref 0.0–0.7)
Eosinophils Relative: 0 %
HEMATOCRIT: 42.5 % (ref 36.0–46.0)
Hemoglobin: 14.2 g/dL (ref 12.0–15.0)
LYMPHS ABS: 2.1 10*3/uL (ref 0.7–4.0)
Lymphocytes Relative: 16 %
MCH: 31.2 pg (ref 26.0–34.0)
MCHC: 33.4 g/dL (ref 30.0–36.0)
MCV: 93.4 fL (ref 78.0–100.0)
MONO ABS: 0.7 10*3/uL (ref 0.1–1.0)
MONOS PCT: 5 %
NEUTROS ABS: 10 10*3/uL — AB (ref 1.7–7.7)
Neutrophils Relative %: 79 %
Platelets: 391 10*3/uL (ref 150–400)
RBC: 4.55 MIL/uL (ref 3.87–5.11)
RDW: 12.5 % (ref 11.5–15.5)
WBC: 12.8 10*3/uL — ABNORMAL HIGH (ref 4.0–10.5)

## 2017-06-03 LAB — COMPREHENSIVE METABOLIC PANEL
ALBUMIN: 3.7 g/dL (ref 3.5–5.0)
ALK PHOS: 127 U/L — AB (ref 38–126)
ALT: 65 U/L — AB (ref 14–54)
AST: 19 U/L (ref 15–41)
Anion gap: 14 (ref 5–15)
BUN: <5 mg/dL — ABNORMAL LOW (ref 6–20)
CALCIUM: 8.8 mg/dL — AB (ref 8.9–10.3)
CHLORIDE: 100 mmol/L — AB (ref 101–111)
CO2: 21 mmol/L — AB (ref 22–32)
Creatinine, Ser: 0.61 mg/dL (ref 0.44–1.00)
GFR calc Af Amer: >60 mL/min (ref >60)
GFR calc non Af Amer: >60 mL/min (ref >60)
GLUCOSE: 184 mg/dL — AB (ref 65–99)
Potassium: 2.9 mmol/L — ABNORMAL LOW (ref 3.5–5.1)
SODIUM: 135 mmol/L (ref 135–145)
Total Bilirubin: 1.2 mg/dL (ref 0.3–1.2)
Total Protein: 6.5 g/dL (ref 6.5–8.1)

## 2017-06-03 LAB — GLUCOSE, CAPILLARY
GLUCOSE-CAPILLARY: 116 mg/dL — AB (ref 65–99)
GLUCOSE-CAPILLARY: 170 mg/dL — AB (ref 65–99)
GLUCOSE-CAPILLARY: 160 mg/dL — AB (ref 65–99)
GLUCOSE-CAPILLARY: 145 mg/dL — AB (ref 65–99)
GLUCOSE-CAPILLARY: 164 mg/dL — AB (ref 65–99)
GLUCOSE-CAPILLARY: 148 mg/dL — AB (ref 65–99)
Glucose-Capillary: 120 mg/dL — ABNORMAL HIGH (ref 65–99)
Glucose-Capillary: 142 mg/dL — ABNORMAL HIGH (ref 65–99)
Glucose-Capillary: 147 mg/dL — ABNORMAL HIGH (ref 65–99)
Glucose-Capillary: 165 mg/dL — ABNORMAL HIGH (ref 65–99)
Glucose-Capillary: 165 mg/dL — ABNORMAL HIGH (ref 65–99)
Glucose-Capillary: 169 mg/dL — ABNORMAL HIGH (ref 65–99)
Glucose-Capillary: 171 mg/dL — ABNORMAL HIGH (ref 65–99)
Glucose-Capillary: 187 mg/dL — ABNORMAL HIGH (ref 65–99)

## 2017-06-03 LAB — HEPATITIS PANEL, ACUTE
HCV Ab: <0.1 s/co ratio (ref 0.0–0.9)
HEP A IGM: NEGATIVE
HEP B C IGM: NEGATIVE
Hepatitis B Surface Ag: NEGATIVE

## 2017-06-03 LAB — HCG, SERUM, QUALITATIVE: PREG SERUM: NEGATIVE

## 2017-06-03 MED ORDER — MORPHINE SULFATE (PF) 2 MG/ML IV SOLN
1.0000 mg | Freq: Once | INTRAVENOUS | Status: AC
  Administered 2017-06-03: 1 mg via INTRAVENOUS
  Filled 2017-06-03: qty 1

## 2017-06-03 MED ORDER — PROMETHAZINE HCL 25 MG/ML IJ SOLN
12.5000 mg | Freq: Once | INTRAMUSCULAR | Status: AC
  Administered 2017-06-03: 12.5 mg via INTRAVENOUS
  Filled 2017-06-03: qty 1

## 2017-06-03 MED ORDER — POTASSIUM CHLORIDE CRYS ER 20 MEQ PO TBCR
40.0000 meq | EXTENDED_RELEASE_TABLET | Freq: Two times a day (BID) | ORAL | Status: AC
  Administered 2017-06-03: 40 meq via ORAL
  Filled 2017-06-03: qty 2

## 2017-06-03 MED ORDER — PROMETHAZINE HCL 25 MG/ML IJ SOLN
12.5000 mg | Freq: Four times a day (QID) | INTRAMUSCULAR | Status: DC | PRN
  Administered 2017-06-03 – 2017-06-04 (×5): 25 mg via INTRAVENOUS
  Filled 2017-06-03 (×5): qty 1

## 2017-06-03 MED ORDER — MORPHINE SULFATE (PF) 2 MG/ML IV SOLN
1.0000 mg | INTRAVENOUS | Status: AC | PRN
  Administered 2017-06-03 (×3): 1 mg via INTRAVENOUS
  Filled 2017-06-03 (×3): qty 1

## 2017-06-03 MED ORDER — IOPAMIDOL (ISOVUE-300) INJECTION 61%
INTRAVENOUS | Status: AC
  Administered 2017-06-03: 100 mL
  Filled 2017-06-03: qty 100

## 2017-06-03 MED ORDER — SODIUM CHLORIDE 0.9 % IV SOLN
INTRAVENOUS | Status: DC
  Administered 2017-06-03: 100 mL/h via INTRAVENOUS
  Administered 2017-06-03: 15:00:00 via INTRAVENOUS

## 2017-06-03 MED ORDER — IOPAMIDOL (ISOVUE-300) INJECTION 61%
INTRAVENOUS | Status: AC
Start: 2017-06-03 — End: 2017-06-04
  Filled 2017-06-03: qty 30

## 2017-06-03 NOTE — Progress Notes (Addendum)
Spoke briefly with patient in regards to her diabetes.  Patient is very nauseated at the moment. Asked her if she could afford buying Novolin Relion [facility] insulin for $25 per bottle.  She said that she could, so recommend patient being discharged on [facility] Novolin Relion 70/30  insulin. Patient had been discharged previously on Novolin 70/30 13 units BID.  Patient states that she needs strips for her blood glucose meter, as well.   Spoke with Steward DroneBrenda, case manager, about this patient and her needs.   Smith MinceKendra Bethany Hirt RN BSN CDE Diabetes Coordinator Pager: [phone]  8am-5pm

## 2017-06-03 NOTE — Progress Notes (Signed)
Initial Nutrition Assessment  DOCUMENTATION CODES:   Not applicable  INTERVENTION:   Recommend check Mg and P labs  Ensure Enlive po BID, each supplement provides 350 kcal and 20 grams of protein  NUTRITION DIAGNOSIS:   Inadequate oral intake related to acute illness as evidenced by meal completion < 25%.  GOAL:   Patient will meet greater than or equal to 90% of their needs  MONITOR:   PO intake, Supplement acceptance, Labs, I & O's, Weight trends  REASON FOR ASSESSMENT:   Malnutrition Screening Tool    ASSESSMENT:   37 yr old female with PMH of HTN, DM type 1, uncontrolled, recently discharged but came back due to generalized abdominal pain, nausea and vomiting.   Met with pt in room today. Pt reports poor appetite and oral intake for 2 days pta r/t nausea and vomiting. Pt reports that she continues to be nauseas today and has not tried to eat anything. Pt is willing to drink Ensure once she is feeling better. Per chart, pt is weight stable. Pt on full liquid diet. Pt already ordered for Ensure but can have premier protein if she prefers. Pt is at risk for refeeding syndrome; recommend check Mg and P labs. Pt has been non compliant with insulin due to financial reasons. Pt may benefit from diabetes education prior to discharge.     Medications reviewed and include: lovenox, insulin, protonix, sucralfate, NaCl '@100ml' /hr, morphine, zofran, phenergan    Labs reviewed: K 3.1(L), BUN <5(L) Wbc- 12.8(H) cbgs- 184, 154, 166 x 24hrs AIC 9.3(H)- 2/7  Nutrition-Focused physical exam completed. Findings are no fat depletion, no muscle depletion, and no edema.   Diet Order:  Diet full liquid Room service appropriate? Yes; Fluid consistency: Thin  EDUCATION NEEDS:   Education needs have been addressed  Skin:  Reviewed RN Assessment  Last BM:  2/6  Height:   Ht Readings from Last 1 Encounters:  06/02/17 '5\' 2"'  (1.575 m)    Weight:   Wt Readings from Last 1 Encounters:   06/03/17 152 lb 12.8 oz (69.3 kg)    Ideal Body Weight:  50 kg  BMI:  Body mass index is 27.95 kg/m.  Estimated Nutritional Needs:   Kcal:  1600-1800kcal/day   Protein:  69-83g/day   Fluid:  >1.7L/day   Koleen Distance MS, RD, LDN Pager #[phone] After Hours Pager: [phone]

## 2017-06-03 NOTE — Care Management (Signed)
1700 06-03-17 Previous ED Case Manager had seen patient and [facility]. Pt is without insurance @ this time. In for DKA- per Diabetes Coordinator pt will benefit most from 70/30 Insulin. [facility] along with [facility] at a cheaper rate. Weekend CM will need to f/u with patient. Gala LewandowskyGraves-Bigelow, Sonia Bromell Kaye, RN,BSN [phone]

## 2017-06-03 NOTE — Progress Notes (Signed)
PROGRESS NOTE  Cheryl Parker ZOX:[id number] DOB: 22-Sep-1980 DOA: 06/01/2017 PCP: Patient, No Pcp Per  HPI/Recap of past 24 hours: 37 yr old female with PMH of HTN, DM type 1, uncontrolled, recently discharged but came back due to generalized abdominal pain, nausea and vomiting. Pt has been out of her medications since 12/18 and hasnt been able to afford her medications. In the ED, pt was found to be in DKA with abnormal liver function, admitted for further management.  Today, pt still reporting nausea, vomiting, abdominal pain. Denies fever/chills, chest pain. Still unable to tolerate orally  Assessment/Plan: Principal Problem:   DKA (diabetic ketoacidoses) (HCC) Active Problems:   Essential hypertension   Abnormal liver function   DKA with hx of Type 1 DM A1c 9.3, uncontrolled HD stable, afebrile, with resolving leukocytosis S/p glucostabilizer, transitioned to [place] insulin IVF, correct K prn, zofran Advanced diet to full liquid Case manager on board for help with medication for discharge  Intractable nausea/vomiting Lipase neg, preg test neg RUQ USS negative CT abdomen/pelvis negative ??Gastroparesis, plan for gastric emptying study  Chest pain Atypical, resolved Trop neg, EKG with no acute changes D-dimer neg CXR neg Monitor closely  Abnormal liver fxn Acute hepatitis panel pending RUQ USS- neg Repeat in am  Leukocytosis Likely reactive, daily cbc    Code Status: Full  Family Communication: None at bedside  Disposition Plan: TBD   Consultants:  None  Procedures:  None  Antimicrobials:  None  DVT prophylaxis:  Lovenox   Objective: Vitals:   06/03/17 0837 06/03/17 1100 06/03/17 1700 06/03/17 1931  BP: (!) 150/86 (!) 144/98 (!) 144/71 (!) 153/104  Pulse: 90 96 95 91  Resp:   18 14  Temp: 99.2 F (37.3 C) 99.1 F (37.3 C) 98.9 F (37.2 C) 99.4 F (37.4 C)  TempSrc: Oral Oral Oral Oral  SpO2: 100% 100% 100% 100%  Weight:      Height:         Intake/Output Summary (Last 24 hours) at 06/03/2017 2211 Last data filed at 06/03/2017 1700 Gross per 24 hour  Intake 1922.43 ml  Output -  Net 1922.43 ml   Filed Weights   06/01/17 1914 06/02/17 1715 06/03/17 0352  Weight: 72.6 kg (160 lb) 70 kg (154 lb 5.2 oz) 69.3 kg (152 lb 12.8 oz)    Exam:   General: Alert, awake, NAD    Cardiovascular: S1, S2 present, no added hrt sound  Respiratory: Chest clear bilaterally   Abdomen: Soft, non-tender on palpation, non-distended, BS present  Musculoskeletal: No pedal edema bilaterally  Skin: Normal  Psychiatry: Normal mood   Data Reviewed: CBC: Recent Labs  Lab 06/01/17 1935 06/03/17 0426  WBC 17.9* 12.8*  NEUTROABS 16.6* 10.0*  HGB 14.6 14.2  HCT 43.0 42.5  MCV 92.9 93.4  PLT 451* 391   Basic Metabolic Panel: Recent Labs  Lab 06/02/17 1126 06/02/17 1744 06/03/17 0426 06/03/17 1030 06/03/17 1314  NA 135 136 135 137 136  K 3.3* 2.9* 2.9* 3.1* 3.1*  CL 104 102 100* 102 101  CO2 17* 21* 21* 22 23  GLUCOSE 205* 150* 184* 154* 166*  BUN <5* <5* <5* <5* <5*  CREATININE 0.56 0.55 0.61 0.53 0.56  CALCIUM 8.6* 8.3* 8.8* 8.9 9.1   GFR: Estimated Creatinine Clearance: 87.9 mL/min (by C-G formula based on SCr of 0.56 mg/dL). Liver Function Tests: Recent Labs  Lab 06/01/17 1935 06/03/17 0426  AST 53* 19  ALT 124* 65*  ALKPHOS 158* 127*  BILITOT 0.7 1.2  PROT 7.6 6.5  ALBUMIN 4.4 3.7   Recent Labs  Lab 06/01/17 1935  LIPASE 31   No results for input(s): AMMONIA in the last 168 hours. Coagulation Profile: No results for input(s): INR, PROTIME in the last 168 hours. Cardiac Enzymes: Recent Labs  Lab 06/02/17 0109 06/02/17 1126  TROPONINI <0.03 <0.03   BNP (last 3 results) No results for input(s): PROBNP in the last 8760 hours. HbA1C: Recent Labs    06/02/17 1126  HGBA1C 9.3*   CBG: Recent Labs  Lab 06/03/17 1032 06/03/17 1140 06/03/17 1251 06/03/17 1357 06/03/17 1929  GLUCAP 142*  164* 147* 165* 148*   Lipid Profile: No results for input(s): CHOL, HDL, LDLCALC, TRIG, CHOLHDL, LDLDIRECT in the last 72 hours. Thyroid Function Tests: No results for input(s): TSH, T4TOTAL, FREET4, T3FREE, THYROIDAB in the last 72 hours. Anemia Panel: No results for input(s): VITAMINB12, FOLATE, FERRITIN, TIBC, IRON, RETICCTPCT in the last 72 hours. Urine analysis:    Component Value Date/Time   COLORURINE YELLOW 06/01/2017 1935   APPEARANCEUR CLEAR 06/01/2017 1935   LABSPEC 1.033 (H) 06/01/2017 1935   PHURINE 7.0 06/01/2017 1935   GLUCOSEU >=500 (A) 06/01/2017 1935   HGBUR NEGATIVE 06/01/2017 1935   BILIRUBINUR NEGATIVE 06/01/2017 1935   KETONESUR 80 (A) 06/01/2017 1935   PROTEINUR 30 (A) 06/01/2017 1935   UROBILINOGEN 0.2 06/23/2015 0852   NITRITE NEGATIVE 06/01/2017 1935   LEUKOCYTESUR NEGATIVE 06/01/2017 1935   Sepsis Labs: @LABRCNTIP (procalcitonin:4,lacticidven:4)  ) Recent Results (from the past 240 hour(s))  MRSA PCR Screening     Status: None   Collection Time: 06/02/17  5:27 PM  Result Value Ref Range Status   MRSA by PCR NEGATIVE NEGATIVE Final    Comment:        The GeneXpert MRSA Assay (FDA approved for NASAL specimens only), is one component of a comprehensive MRSA colonization surveillance program. It is not intended to diagnose MRSA infection nor to guide or monitor treatment for MRSA infections. Performed at [facility], [address] [place], [place] [address]       Studies: Ct Abdomen Pelvis W Contrast  Result Date: 06/03/2017 CLINICAL DATA:  Upper abdominal pain with nausea and vomiting over the last 3 days. Negative right upper quadrant ultrasound. EXAM: CT ABDOMEN AND PELVIS WITH CONTRAST TECHNIQUE: Multidetector CT imaging of the abdomen and pelvis was performed using the standard protocol following bolus administration of intravenous contrast. CONTRAST:  100mL ISOVUE-300 IOPAMIDOL (ISOVUE-300) INJECTION 61% COMPARISON:  Ultrasound  yesterday.  CT 12/27/2016. FINDINGS: Lower chest: Normal Hepatobiliary: Focal fatty change adjacent to the falciform ligament. No significant liver finding. No calcified gallstones. Pancreas: Normal Spleen: Normal Adrenals/Urinary Tract: Adrenal glands are normal. Kidneys are normal. Bladder is normal. Stomach/Bowel: No abnormal bowel finding. No evidence of obstruction or inflammatory disease. Appendix is normal. Vascular/Lymphatic: Aortic atherosclerosis, premature for age. No aneurysm. Stenotic pattern of the distal aorta and proximal iliac arteries. Reproductive: Normal Other: No free fluid or air. Musculoskeletal: Normal IMPRESSION: No acute or significant finding by CT. No cause of upper abdominal pain, nausea and vomiting identified. Premature for age aortic atherosclerosis. Electronically Signed   By: Paulina FusiMark  Shogry M.D.   On: 06/03/2017 16:25    Scheduled Meds: . enoxaparin (LOVENOX) injection  40 mg Subcutaneous Q24H  . feeding supplement (ENSURE ENLIVE)  237 mL Oral BID BM  . insulin aspart  0-15 Units Subcutaneous Q4H  . insulin glargine  10 Units Subcutaneous Daily  . iopamidol      .  lisinopril  5 mg Oral Daily  . pantoprazole  20 mg Oral Daily  . sucralfate  1 g Oral TID WC & HS    Continuous Infusions: . sodium chloride 100 mL/hr at 06/03/17 1431     LOS: 2 days     Briant Cedar, MD [facility]   If 7PM-7AM, please contact night-coverage [email or web address] Password [email or web address] 06/03/2017, 10:11 PM

## 2017-06-03 NOTE — Final Progress Note (Signed)
Patient vomited intermittently during the night with frequent dry heaving.  PRN Zofran given was not effective. She also c/o 10/10 epigastric pain radiating to her back. On call Blount X, NP made aware; order received for Levsin 0.5 mg x 1, Phenergan 12.5 mg x 2 and Morphine 1 mg q4hrs severe pain. She is currently in bed with eyes closed.

## 2017-06-03 NOTE — Progress Notes (Signed)
Spoke to Diabetes coordinators and MD. Molli Knockkay to turn off insulin drip. Will cont to monitor pt.

## 2017-06-04 LAB — CBC WITH DIFFERENTIAL/PLATELET
Basophils Absolute: 0 10*3/uL (ref 0.0–0.1)
Basophils Relative: 0 %
EOS ABS: 0 10*3/uL (ref 0.0–0.7)
Eosinophils Relative: 0 %
HEMATOCRIT: 42.6 % (ref 36.0–46.0)
HEMOGLOBIN: 14.4 g/dL (ref 12.0–15.0)
LYMPHS ABS: 2.8 10*3/uL (ref 0.7–4.0)
LYMPHS PCT: 24 %
MCH: 31.8 pg (ref 26.0–34.0)
MCHC: 33.8 g/dL (ref 30.0–36.0)
MCV: 94 fL (ref 78.0–100.0)
MONOS PCT: 5 %
Monocytes Absolute: 0.6 10*3/uL (ref 0.1–1.0)
NEUTROS ABS: 8.3 10*3/uL — AB (ref 1.7–7.7)
NEUTROS PCT: 71 %
Platelets: 354 10*3/uL (ref 150–400)
RBC: 4.53 MIL/uL (ref 3.87–5.11)
RDW: 12.4 % (ref 11.5–15.5)
WBC: 11.7 10*3/uL — AB (ref 4.0–10.5)

## 2017-06-04 LAB — GLUCOSE, CAPILLARY
GLUCOSE-CAPILLARY: 162 mg/dL — AB (ref 65–99)
GLUCOSE-CAPILLARY: 140 mg/dL — AB (ref 65–99)
GLUCOSE-CAPILLARY: 105 mg/dL — AB (ref 65–99)
GLUCOSE-CAPILLARY: 128 mg/dL — AB (ref 65–99)
Glucose-Capillary: 139 mg/dL — ABNORMAL HIGH (ref 65–99)
Glucose-Capillary: 143 mg/dL — ABNORMAL HIGH (ref 65–99)
Glucose-Capillary: 144 mg/dL — ABNORMAL HIGH (ref 65–99)

## 2017-06-04 LAB — BASIC METABOLIC PANEL
Anion gap: 12 (ref 5–15)
BUN: <5 mg/dL — ABNORMAL LOW (ref 6–20)
CHLORIDE: 100 mmol/L — AB (ref 101–111)
CO2: 24 mmol/L (ref 22–32)
CREATININE: 0.6 mg/dL (ref 0.44–1.00)
Calcium: 8.7 mg/dL — ABNORMAL LOW (ref 8.9–10.3)
GFR calc non Af Amer: >60 mL/min (ref >60)
Glucose, Bld: 151 mg/dL — ABNORMAL HIGH (ref 65–99)
POTASSIUM: 2.8 mmol/L — AB (ref 3.5–5.1)
Sodium: 136 mmol/L (ref 135–145)

## 2017-06-04 MED ORDER — MORPHINE SULFATE (PF) 2 MG/ML IV SOLN
1.0000 mg | Freq: Two times a day (BID) | INTRAVENOUS | Status: AC | PRN
  Administered 2017-06-04 (×2): 1 mg via INTRAVENOUS
  Filled 2017-06-04 (×2): qty 1

## 2017-06-04 MED ORDER — MORPHINE SULFATE (PF) 2 MG/ML IV SOLN: 1.0000 mg | Freq: Two times a day (BID) | INTRAVENOUS | Status: DC | PRN

## 2017-06-04 MED ORDER — CLARITHROMYCIN 500 MG PO TABS
500.0000 mg | ORAL_TABLET | Freq: Two times a day (BID) | ORAL | Status: DC
  Administered 2017-06-04 – 2017-06-06 (×4): 500 mg via ORAL
  Filled 2017-06-04 (×5): qty 1

## 2017-06-04 MED ORDER — POTASSIUM CHLORIDE IN NACL 40-0.9 MEQ/L-% IV SOLN
INTRAVENOUS | Status: DC
  Administered 2017-06-04 (×2): 100 mL/h via INTRAVENOUS
  Administered 2017-06-05 – 2017-06-06 (×2): 75 mL/h via INTRAVENOUS
  Filled 2017-06-04 (×6): qty 1000

## 2017-06-04 MED ORDER — AMOXICILLIN 500 MG PO CAPS
1000.0000 mg | ORAL_CAPSULE | Freq: Two times a day (BID) | ORAL | Status: DC
  Administered 2017-06-04 – 2017-06-06 (×4): 1000 mg via ORAL
  Filled 2017-06-04 (×5): qty 2

## 2017-06-04 MED ORDER — POTASSIUM CHLORIDE 10 MEQ/100ML IV SOLN
10.0000 meq | INTRAVENOUS | Status: DC
  Administered 2017-06-04 (×2): 10 meq via INTRAVENOUS
  Filled 2017-06-04 (×4): qty 100

## 2017-06-04 MED ORDER — PANTOPRAZOLE SODIUM 20 MG PO TBEC
20.0000 mg | DELAYED_RELEASE_TABLET | Freq: Two times a day (BID) | ORAL | Status: DC
  Administered 2017-06-04 – 2017-06-06 (×4): 20 mg via ORAL
  Filled 2017-06-04 (×4): qty 1

## 2017-06-04 NOTE — Progress Notes (Signed)
PRN Phenergan 25 mg given at 2030 for N/V was effective. She later c/o abdominal pain 10/10. On call Dr. Arbutus Leasat made aware and ordered a one time dose of  Morphine 1 mg administered at 2355. It was effective.   Patient is currently resting comfortably. Will continue to monitor.

## 2017-06-04 NOTE — Progress Notes (Signed)
PROGRESS NOTE  Cheryl Parker OZH:[id number] DOB: 08-21-1980 DOA: 06/01/2017 PCP: Patient, No Pcp Per  HPI/Recap of past 24 hours: 37 yr old female with PMH of HTN, DM type 1, uncontrolled, recently discharged but came back due to generalized abdominal pain, nausea and vomiting. Pt has been out of her medications since 12/18 and hasnt been able to afford her medications. In the ED, pt was found to be in DKA with abnormal liver function, admitted for further management.  Today, pt still reported abdominal pain, resolving nausea/vomiting. BS much better controlled. Denies any chest pain, SOB, fever/chills.   Assessment/Plan: Principal Problem:   DKA (diabetic ketoacidoses) (HCC) Active Problems:   Essential hypertension   Abnormal liver function   DKA with hx of Type 1 DM A1c 9.3, uncontrolled HD stable, afebrile, with resolving leukocytosis S/p glucostabilizer, transitioned to [place] insulin IVF, correct K prn, zofran Advanced diet to full liquid Case manager on board for help with medication for discharge  Intractable nausea/vomiting Lipase neg, preg test neg RUQ USS negative CT abdomen/pelvis negative ??Gastroparesis, plan for gastric emptying study on 06/06/17, NPO after midnight  H. Pylori positive on biopsy EGD in 12/2016 with biopsy + H.pylori Start prev-pac: Amoxicillin, clarithromycin, pantoprazole  Chest pain Atypical, resolved Trop neg, EKG with no acute changes D-dimer neg CXR neg Monitor closely  Abnormal liver fxn Acute hepatitis panel pending RUQ USS- neg Repeat in am  Leukocytosis Likely reactive, daily cbc    Code Status: Full  Family Communication: None at bedside  Disposition Plan: TBD   Consultants:  None  Procedures:  None  Antimicrobials:  None  DVT prophylaxis:  Lovenox   Objective: Vitals:   06/04/17 0550 06/04/17 0813 06/04/17 1205 06/04/17 1703  BP: (!) 142/96 (!) 147/93 (!) 158/80 (!) 156/94  Pulse: 97 88 89 (!) 107    Resp: 17 15 11 17   Temp: 98.6 F (37 C) 98.6 F (37 C) 98.7 F (37.1 C) 99 F (37.2 C)  TempSrc: Oral Oral Oral Oral  SpO2: 100% 99%  99%  Weight: 70.1 kg (154 lb 8.7 oz)     Height:        Intake/Output Summary (Last 24 hours) at 06/04/2017 1840 Last data filed at 06/04/2017 1800 Gross per 24 hour  Intake 2061.67 ml  Output -  Net 2061.67 ml   Filed Weights   06/02/17 1715 06/03/17 0352 06/04/17 0550  Weight: 70 kg (154 lb 5.2 oz) 69.3 kg (152 lb 12.8 oz) 70.1 kg (154 lb 8.7 oz)    Exam:   General: Alert, awake, NAD    Cardiovascular: S1, S2 present, no added hrt sound  Respiratory: Chest clear bilaterally   Abdomen: Soft, +tender on palpation, non-distended, BS present  Musculoskeletal: No pedal edema bilaterally  Skin: Normal  Psychiatry: Normal mood   Data Reviewed: CBC: Recent Labs  Lab 06/01/17 1935 06/03/17 0426 06/04/17 0345  WBC 17.9* 12.8* 11.7*  NEUTROABS 16.6* 10.0* 8.3*  HGB 14.6 14.2 14.4  HCT 43.0 42.5 42.6  MCV 92.9 93.4 94.0  PLT 451* 391 354   Basic Metabolic Panel: Recent Labs  Lab 06/02/17 1744 06/03/17 0426 06/03/17 1030 06/03/17 1314 06/04/17 0345  NA 136 135 137 136 136  K 2.9* 2.9* 3.1* 3.1* 2.8*  CL 102 100* 102 101 100*  CO2 21* 21* 22 23 24   GLUCOSE 150* 184* 154* 166* 151*  BUN <5* <5* <5* <5* <5*  CREATININE 0.55 0.61 0.53 0.56 0.60  CALCIUM 8.3* 8.8* 8.9 9.1  8.7*   GFR: Estimated Creatinine Clearance: 88.3 mL/min (by C-G formula based on SCr of 0.6 mg/dL). Liver Function Tests: Recent Labs  Lab 06/01/17 1935 06/03/17 0426  AST 53* 19  ALT 124* 65*  ALKPHOS 158* 127*  BILITOT 0.7 1.2  PROT 7.6 6.5  ALBUMIN 4.4 3.7   Recent Labs  Lab 06/01/17 1935  LIPASE 31   No results for input(s): AMMONIA in the last 168 hours. Coagulation Profile: No results for input(s): INR, PROTIME in the last 168 hours. Cardiac Enzymes: Recent Labs  Lab 06/02/17 0109 06/02/17 1126  TROPONINI <0.03 <0.03   BNP (last  3 results) No results for input(s): PROBNP in the last 8760 hours. HbA1C: Recent Labs    06/02/17 1126  HGBA1C 9.3*   CBG: Recent Labs  Lab 06/04/17 0009 06/04/17 0439 06/04/17 0714 06/04/17 1131 06/04/17 1702  GLUCAP 143* 162* 144* 140* 105*   Lipid Profile: No results for input(s): CHOL, HDL, LDLCALC, TRIG, CHOLHDL, LDLDIRECT in the last 72 hours. Thyroid Function Tests: No results for input(s): TSH, T4TOTAL, FREET4, T3FREE, THYROIDAB in the last 72 hours. Anemia Panel: No results for input(s): VITAMINB12, FOLATE, FERRITIN, TIBC, IRON, RETICCTPCT in the last 72 hours. Urine analysis:    Component Value Date/Time   COLORURINE YELLOW 06/01/2017 1935   APPEARANCEUR CLEAR 06/01/2017 1935   LABSPEC 1.033 (H) 06/01/2017 1935   PHURINE 7.0 06/01/2017 1935   GLUCOSEU >=500 (A) 06/01/2017 1935   HGBUR NEGATIVE 06/01/2017 1935   BILIRUBINUR NEGATIVE 06/01/2017 1935   KETONESUR 80 (A) 06/01/2017 1935   PROTEINUR 30 (A) 06/01/2017 1935   UROBILINOGEN 0.2 06/23/2015 0852   NITRITE NEGATIVE 06/01/2017 1935   LEUKOCYTESUR NEGATIVE 06/01/2017 1935   Sepsis Labs: @LABRCNTIP (procalcitonin:4,lacticidven:4)  ) Recent Results (from the past 240 hour(s))  MRSA PCR Screening     Status: None   Collection Time: 06/02/17  5:27 PM  Result Value Ref Range Status   MRSA by PCR NEGATIVE NEGATIVE Final    Comment:        The GeneXpert MRSA Assay (FDA approved for NASAL specimens only), is one component of a comprehensive MRSA colonization surveillance program. It is not intended to diagnose MRSA infection nor to guide or monitor treatment for MRSA infections. Performed at [facility], [address] [place], [place] [address]       Studies: No results found.  Scheduled Meds: . enoxaparin (LOVENOX) injection  40 mg Subcutaneous Q24H  . feeding supplement (ENSURE ENLIVE)  237 mL Oral BID BM  . insulin aspart  0-15 Units Subcutaneous Q4H  . insulin glargine  10 Units  Subcutaneous Daily  . lisinopril  5 mg Oral Daily  . pantoprazole  20 mg Oral Daily  . sucralfate  1 g Oral TID WC & HS    Continuous Infusions: . 0.9 % NaCl with KCl 40 mEq / L 100 mL/hr (06/04/17 1123)     LOS: 3 days     Briant CedarNkeiruka J Taryn Nave, MD [facility]   If 7PM-7AM, please contact night-coverage [email or web address] Password [email or web address] 06/04/2017, 6:40 PM

## 2017-06-05 LAB — GLUCOSE, CAPILLARY
GLUCOSE-CAPILLARY: 93 mg/dL (ref 65–99)
GLUCOSE-CAPILLARY: 194 mg/dL — AB (ref 65–99)
GLUCOSE-CAPILLARY: 199 mg/dL — AB (ref 65–99)
GLUCOSE-CAPILLARY: 226 mg/dL — AB (ref 65–99)
Glucose-Capillary: 149 mg/dL — ABNORMAL HIGH (ref 65–99)
Glucose-Capillary: 176 mg/dL — ABNORMAL HIGH (ref 65–99)

## 2017-06-05 LAB — CBC WITH DIFFERENTIAL/PLATELET
Basophils Absolute: 0 10*3/uL (ref 0.0–0.1)
Basophils Relative: 0 %
EOS ABS: 0 10*3/uL (ref 0.0–0.7)
Eosinophils Relative: 0 %
HCT: 40.8 % (ref 36.0–46.0)
HEMOGLOBIN: 13.8 g/dL (ref 12.0–15.0)
LYMPHS ABS: 3.9 10*3/uL (ref 0.7–4.0)
Lymphocytes Relative: 32 %
MCH: 31.5 pg (ref 26.0–34.0)
MCHC: 33.8 g/dL (ref 30.0–36.0)
MCV: 93.2 fL (ref 78.0–100.0)
MONOS PCT: 6 %
Monocytes Absolute: 0.7 10*3/uL (ref 0.1–1.0)
NEUTROS PCT: 62 %
Neutro Abs: 7.5 10*3/uL (ref 1.7–7.7)
Platelets: 337 10*3/uL (ref 150–400)
RBC: 4.38 MIL/uL (ref 3.87–5.11)
RDW: 12 % (ref 11.5–15.5)
WBC: 12.1 10*3/uL — ABNORMAL HIGH (ref 4.0–10.5)

## 2017-06-05 LAB — BASIC METABOLIC PANEL
Anion gap: 10 (ref 5–15)
CHLORIDE: 102 mmol/L (ref 101–111)
CO2: 23 mmol/L (ref 22–32)
CREATININE: 0.56 mg/dL (ref 0.44–1.00)
Calcium: 8.5 mg/dL — ABNORMAL LOW (ref 8.9–10.3)
GFR calc Af Amer: >60 mL/min (ref >60)
GFR calc non Af Amer: >60 mL/min (ref >60)
GLUCOSE: 95 mg/dL (ref 65–99)
Potassium: 3 mmol/L — ABNORMAL LOW (ref 3.5–5.1)
SODIUM: 135 mmol/L (ref 135–145)

## 2017-06-05 MED ORDER — POTASSIUM CHLORIDE 20 MEQ PO PACK
40.0000 meq | PACK | Freq: Two times a day (BID) | ORAL | Status: DC
  Administered 2017-06-05 – 2017-06-06 (×3): 40 meq via ORAL
  Filled 2017-06-05 (×5): qty 2

## 2017-06-05 NOTE — Progress Notes (Signed)
PROGRESS NOTE  Cheryl Parker ZOX:[id number] DOB: 28-Jul-1980 DOA: 06/01/2017 PCP: Patient, No Pcp Per  HPI/Recap of past 24 hours: 37 yr old female with PMH of HTN, DM type 1, uncontrolled, recently discharged but came back due to generalized abdominal pain, nausea and vomiting. Pt has been out of her medications since 12/18 and hasnt been able to afford her medications. In the ED, pt was found to be in DKA with abnormal liver function, admitted for further management.  Today, pt reported back pain, resolving abdominal pain, nausea/vomiting. BS much better controlled. Denies any chest pain, SOB, fever/chills. Advancing diet  Assessment/Plan: Principal Problem:   DKA (diabetic ketoacidoses) (HCC) Active Problems:   Essential hypertension   Abnormal liver function   DKA with hx of Type 1 DM A1c 9.3, uncontrolled HD stable, afebrile, with resolving leukocytosis S/p glucostabilizer, transitioned to [place] insulin IVF, correct K prn, zofran Advanced diet  Case manager on board for help with medication for discharge and establishing PCP  Intractable nausea/vomiting Lipase neg, preg test neg RUQ USS negative CT abdomen/pelvis negative ??Gastroparesis, plan for gastric emptying study on 06/06/17, NPO after midnight  H. Pylori positive on biopsy EGD in 12/2016 with biopsy positive for H.pylori Start prev-pac: Amoxicillin, clarithromycin, pantoprazole for 2 weeks  Chest pain Atypical, resolved Trop neg, EKG with no acute changes D-dimer neg CXR neg Monitor closely  Abnormal liver fxn Acute hepatitis panel pending RUQ USS- neg Repeat in am  Leukocytosis Resolved Likely reactive, daily cbc    Code Status: Full  Family Communication: None at bedside  Disposition Plan: Home once stable   Consultants:  None  Procedures:  None  Antimicrobials:  PO Amoxicillin  PO Clarithromycin  DVT prophylaxis:  Lovenox   Objective: Vitals:   06/05/17 0444 06/05/17 0720  06/05/17 0834 06/05/17 1150  BP: 128/80 121/75 113/75 123/86  Pulse: 95 86  (!) 102  Resp: 18 13  19   Temp: 98.2 F (36.8 C) 98.5 F (36.9 C)  98.9 F (37.2 C)  TempSrc: Oral Oral  Oral  SpO2: 100% 100%  100%  Weight: 69 kg (152 lb 1.6 oz)     Height:        Intake/Output Summary (Last 24 hours) at 06/05/2017 1637 Last data filed at 06/05/2017 1300 Gross per 24 hour  Intake 1340 ml  Output -  Net 1340 ml   Filed Weights   06/03/17 0352 06/04/17 0550 06/05/17 0444  Weight: 69.3 kg (152 lb 12.8 oz) 70.1 kg (154 lb 8.7 oz) 69 kg (152 lb 1.6 oz)    Exam:   General: Alert, awake, NAD    Cardiovascular: S1, S2 present, no added hrt sound  Respiratory: Chest clear bilaterally   Abdomen: Soft, non-tender, non-distended, BS present  Musculoskeletal: No pedal edema bilaterally  Skin: Normal  Psychiatry: Normal mood   Data Reviewed: CBC: Recent Labs  Lab 06/01/17 1935 06/03/17 0426 06/04/17 0345 06/05/17 0257  WBC 17.9* 12.8* 11.7* 12.1*  NEUTROABS 16.6* 10.0* 8.3* 7.5  HGB 14.6 14.2 14.4 13.8  HCT 43.0 42.5 42.6 40.8  MCV 92.9 93.4 94.0 93.2  PLT 451* 391 354 337   Basic Metabolic Panel: Recent Labs  Lab 06/03/17 0426 06/03/17 1030 06/03/17 1314 06/04/17 0345 06/05/17 0257  NA 135 137 136 136 135  K 2.9* 3.1* 3.1* 2.8* 3.0*  CL 100* 102 101 100* 102  CO2 21* 22 23 24 23   GLUCOSE 184* 154* 166* 151* 95  BUN <5* <5* <5* <5* <5*  CREATININE 0.61 0.53 0.56 0.60 0.56  CALCIUM 8.8* 8.9 9.1 8.7* 8.5*   GFR: Estimated Creatinine Clearance: 87.7 mL/min (by C-G formula based on SCr of 0.56 mg/dL). Liver Function Tests: Recent Labs  Lab 06/01/17 1935 06/03/17 0426  AST 53* 19  ALT 124* 65*  ALKPHOS 158* 127*  BILITOT 0.7 1.2  PROT 7.6 6.5  ALBUMIN 4.4 3.7   Recent Labs  Lab 06/01/17 1935  LIPASE 31   No results for input(s): AMMONIA in the last 168 hours. Coagulation Profile: No results for input(s): INR, PROTIME in the last 168  hours. Cardiac Enzymes: Recent Labs  Lab 06/02/17 0109 06/02/17 1126  TROPONINI <0.03 <0.03   BNP (last 3 results) No results for input(s): PROBNP in the last 8760 hours. HbA1C: No results for input(s): HGBA1C in the last 72 hours. CBG: Recent Labs  Lab 06/04/17 2001 06/04/17 2327 06/05/17 0437 06/05/17 0720 06/05/17 1145  GLUCAP 128* 139* 93 149* 194*   Lipid Profile: No results for input(s): CHOL, HDL, LDLCALC, TRIG, CHOLHDL, LDLDIRECT in the last 72 hours. Thyroid Function Tests: No results for input(s): TSH, T4TOTAL, FREET4, T3FREE, THYROIDAB in the last 72 hours. Anemia Panel: No results for input(s): VITAMINB12, FOLATE, FERRITIN, TIBC, IRON, RETICCTPCT in the last 72 hours. Urine analysis:    Component Value Date/Time   COLORURINE YELLOW 06/01/2017 1935   APPEARANCEUR CLEAR 06/01/2017 1935   LABSPEC 1.033 (H) 06/01/2017 1935   PHURINE 7.0 06/01/2017 1935   GLUCOSEU >=500 (A) 06/01/2017 1935   HGBUR NEGATIVE 06/01/2017 1935   BILIRUBINUR NEGATIVE 06/01/2017 1935   KETONESUR 80 (A) 06/01/2017 1935   PROTEINUR 30 (A) 06/01/2017 1935   UROBILINOGEN 0.2 06/23/2015 0852   NITRITE NEGATIVE 06/01/2017 1935   LEUKOCYTESUR NEGATIVE 06/01/2017 1935   Sepsis Labs: @LABRCNTIP (procalcitonin:4,lacticidven:4)  ) Recent Results (from the past 240 hour(s))  MRSA PCR Screening     Status: None   Collection Time: 06/02/17  5:27 PM  Result Value Ref Range Status   MRSA by PCR NEGATIVE NEGATIVE Final    Comment:        The GeneXpert MRSA Assay (FDA approved for NASAL specimens only), is one component of a comprehensive MRSA colonization surveillance program. It is not intended to diagnose MRSA infection nor to guide or monitor treatment for MRSA infections. Performed at [facility], [address], [place], [place] [address]       Studies: No results found.  Scheduled Meds: . amoxicillin  1,000 mg Oral Q12H  . clarithromycin  500 mg Oral Q12H  .  enoxaparin (LOVENOX) injection  40 mg Subcutaneous Q24H  . feeding supplement (ENSURE ENLIVE)  237 mL Oral BID BM  . insulin aspart  0-15 Units Subcutaneous Q4H  . insulin glargine  10 Units Subcutaneous Daily  . lisinopril  5 mg Oral Daily  . pantoprazole  20 mg Oral BID  . potassium chloride  40 mEq Oral BID  . sucralfate  1 g Oral TID WC & HS    Continuous Infusions: . 0.9 % NaCl with KCl 40 mEq / L 75 mL/hr (06/05/17 0846)     LOS: 4 days     Briant Cedar, MD [facility]   If 7PM-7AM, please contact night-coverage [email or web address] Password [facility] 06/05/2017, 4:37 PM

## 2017-06-06 ENCOUNTER — Telehealth: Payer: Self-pay

## 2017-06-06 ENCOUNTER — Inpatient Hospital Stay (HOSPITAL_COMMUNITY): Payer: Self-pay

## 2017-06-06 DIAGNOSIS — E1010 Type 1 diabetes mellitus with ketoacidosis without coma: Principal | ICD-10-CM

## 2017-06-06 LAB — CBC WITH DIFFERENTIAL/PLATELET
BASOS PCT: 0 %
Basophils Absolute: 0 10*3/uL (ref 0.0–0.1)
Eosinophils Absolute: 0.1 10*3/uL (ref 0.0–0.7)
Eosinophils Relative: 1 %
HEMATOCRIT: 36 % (ref 36.0–46.0)
HEMOGLOBIN: 11.9 g/dL — AB (ref 12.0–15.0)
LYMPHS PCT: 35 %
Lymphs Abs: 3.9 10*3/uL (ref 0.7–4.0)
MCH: 31 pg (ref 26.0–34.0)
MCHC: 33.1 g/dL (ref 30.0–36.0)
MCV: 93.8 fL (ref 78.0–100.0)
MONOS PCT: 6 %
Monocytes Absolute: 0.7 10*3/uL (ref 0.1–1.0)
NEUTROS ABS: 6.6 10*3/uL (ref 1.7–7.7)
NEUTROS PCT: 58 %
Platelets: 357 10*3/uL (ref 150–400)
RBC: 3.84 MIL/uL — ABNORMAL LOW (ref 3.87–5.11)
RDW: 12.2 % (ref 11.5–15.5)
WBC: 11.3 10*3/uL — ABNORMAL HIGH (ref 4.0–10.5)

## 2017-06-06 LAB — GLUCOSE, CAPILLARY
GLUCOSE-CAPILLARY: 117 mg/dL — AB (ref 65–99)
GLUCOSE-CAPILLARY: 135 mg/dL — AB (ref 65–99)
Glucose-Capillary: 88 mg/dL (ref 65–99)

## 2017-06-06 LAB — BASIC METABOLIC PANEL
Anion gap: 10 (ref 5–15)
BUN: 6 mg/dL (ref 6–20)
CHLORIDE: 104 mmol/L (ref 101–111)
CO2: 23 mmol/L (ref 22–32)
CREATININE: 0.56 mg/dL (ref 0.44–1.00)
Calcium: 8.5 mg/dL — ABNORMAL LOW (ref 8.9–10.3)
GFR calc non Af Amer: >60 mL/min (ref >60)
GLUCOSE: 90 mg/dL (ref 65–99)
Potassium: 3.6 mmol/L (ref 3.5–5.1)
Sodium: 137 mmol/L (ref 135–145)

## 2017-06-06 MED ORDER — CLARITHROMYCIN 500 MG PO TABS: 500.0000 mg | ORAL_TABLET | Freq: Two times a day (BID) | ORAL | 0 refills | Status: AC

## 2017-06-06 MED ORDER — TRUE METRIX METER DEVI: 1.0000 | Freq: Three times a day (TID) | 0 refills | Status: DC

## 2017-06-06 MED ORDER — "INSULIN SYRINGE-NEEDLE U-100 30G X 5/16"" 0.3 ML MISC": 0 refills | Status: DC

## 2017-06-06 MED ORDER — INSULIN NPH ISOPHANE & REGULAR (70-30) 100 UNIT/ML ~~LOC~~ SUSP: SUBCUTANEOUS | 0 refills | Status: DC

## 2017-06-06 MED ORDER — TECHNETIUM TC 99M SULFUR COLLOID
2.1700 | Freq: Once | INTRAVENOUS | Status: AC | PRN
  Administered 2017-06-06: 2.17 via ORAL

## 2017-06-06 MED ORDER — AMOXICILLIN 500 MG PO CAPS: 1000.0000 mg | ORAL_CAPSULE | Freq: Two times a day (BID) | ORAL | 0 refills | Status: AC

## 2017-06-06 MED ORDER — OMEPRAZOLE 20 MG PO CPDR: 20.0000 mg | DELAYED_RELEASE_CAPSULE | Freq: Two times a day (BID) | ORAL | 0 refills | Status: DC

## 2017-06-06 MED ORDER — TRUEPLUS LANCETS 28G MISC: 1.0000 | Freq: Three times a day (TID) | 0 refills | Status: DC

## 2017-06-06 MED ORDER — DEXTROSE 50 % IV SOLN: 25.0000 g | Freq: Once | INTRAVENOUS | Status: DC

## 2017-06-06 MED ORDER — GLUCOSE BLOOD VI STRP: ORAL_STRIP | 12 refills | Status: DC

## 2017-06-06 NOTE — Progress Notes (Signed)
Cheryl Parker to be D/C'd home per MD order.  Discussed with the patient and all questions fully answered.  VSS, Skin clean, dry and intact without evidence of skin break down, no evidence of skin tears noted. IV catheter discontinued intact. Site without signs and symptoms of complications. Dressing and pressure applied.  An After Visit Summary was printed and given to the patient. Patient received prescription.  D/c education completed with patient/family including follow up instructions, medication list, d/c activities limitations if indicated, with other d/c instructions as indicated by MD - patient able to verbalize understanding, all questions fully answered.   Patient instructed to return to ED, call 911, or call MD for any changes in condition.   Patient escorted via WC, and D/C home via private auto.  [name] 06/06/2017 4:48 PM

## 2017-06-06 NOTE — Telephone Encounter (Signed)
Call received from Tomi BambergerBrenda Graves-Bigelow, RN CM requesting a [facility] .   An appointment with the Transitional [facility] was scheduled for 06/09/17 and then changed to 06/15/17 @ 1415 at the patient's request. She had informed Lujean RaveLauren McDaniel, Diabetic Education that she was not able to get to the clinic on 06/09/17.   This CM to contact patient tomorrow for transition of care call. Contact # [phone].

## 2017-06-06 NOTE — Discharge Summary (Signed)
Discharge Summary  Cheryl Parker EPP:[id number] DOB: May 02, 1980  PCP: Patient, No Pcp Per  Admit date: 06/01/2017 Discharge date: 06/06/2017  Time spent: > 30 mins  Recommendations for Outpatient Follow-up:  1. PCP, established care at [facility]   Discharge Diagnoses:  Active Hospital Problems   Diagnosis Date Noted  . DKA (diabetic ketoacidoses) ([place]) 09/10/2014  . Abnormal liver function 06/01/2017  . Essential hypertension 08/04/2015    Resolved Hospital Problems  No resolved problems to display.    Discharge Condition: Stable  Diet recommendation: Mod carb, heart healthy  Vitals:   06/06/17 0807 06/06/17 0856  BP: 114/71 115/78  Pulse: 91   Resp:    Temp: 98.5 F (36.9 C)   SpO2: 100%     History of present illness:  37 yr old female with PMH of HTN, DM type 1, uncontrolled, recently discharged but came back due to generalized abdominal pain, nausea and vomiting. Pt has been out of her medications since 12/18 and hasnt been able to afford her medications. In the ED, pt was found to be in DKA with abnormal liver function, admitted for further management.  Today, pt reported feeling much better, denies any chest pain, SOB, fever/chills. Stable to be discharged.  Hospital Course:  Principal Problem:   DKA (diabetic ketoacidoses) ([place]) Active Problems:   Essential hypertension   Abnormal liver function  DKA with hx of Type 1 DM Better controlled A1c 9.3, poor adherence due to inability to afford meds and no PCP HD stable, afebrile, with resolving leukocytosis S/p glucostabilizer, transitioned to [place] insulin Continue Novolin 70/30, diabetic kit provided Case manager on board for helped with medication for discharge and setting up PCP appointment  Recurrent intractable nausea/vomiting Resolved, likely due to DKA Lipase neg, preg test neg RUQ USS negative CT abdomen/pelvis negative Gastric emptying study normal on 06/06/17  H. Pylori positive  on biopsy EGD in 12/2016 with biopsy + H.pylori Continue Amoxicillin, clarithromycin, omeprazole for total of 14 days  Chest pain Atypical, resolved Trop neg, EKG with no acute changes D-dimer neg CXR neg Monitor closely  Abnormal liver fxn Hepatitis panel neg RUQ USS- neg    Procedures:  Gastric emptying study on 06/06/17  Consultations:  None  Discharge Exam: BP 115/78   Pulse 91   Temp 98.5 F (36.9 C) (Oral)   Resp 10   Ht '5\' 2"'  (1.575 m)   Wt 70.1 kg (154 lb 9.6 oz)   LMP 05/10/2017 (Within Days)   SpO2 100%   BMI 28.28 kg/m   General: Alert, awake, oriented Cardiovascular: S1, S2 present, no added hrt sound Respiratory: Chest clear bilaterally   Discharge Instructions You were cared for by a hospitalist during your hospital stay. If you have any questions about your discharge medications or the care you received while you were in the hospital after you are discharged, you can call the unit and asked to speak with the hospitalist on call if the hospitalist that took care of you is not available. Once you are discharged, your primary care physician will handle any further medical issues. Please note that NO REFILLS for any discharge medications will be authorized once you are discharged, as it is imperative that you return to your primary care physician (or establish a relationship with a primary care physician if you do not have one) for your aftercare needs so that they can reassess your need for medications and monitor your lab values.   Allergies as of 06/06/2017  No Known Allergies     Medication List    STOP taking these medications   blood glucose meter kit and supplies   Insulin Pen Needle 31G X 5 MM Misc   INSULIN SYRINGE .3CC/31GX5/16" 31G X 5/16" 0.3 ML Misc Replaced by:  Insulin Syringe-Needle U-100 30G X 5/16" 0.3 ML Misc   pantoprazole 20 MG tablet Commonly known as:  PROTONIX   sucralfate 1 GM/10ML suspension Commonly known as:   CARAFATE     TAKE these medications   acetaminophen 500 MG tablet Commonly known as:  TYLENOL Take 1,000 mg by mouth every 6 (six) hours as needed for mild pain.   amoxicillin 500 MG capsule Commonly known as:  AMOXIL Take 2 capsules (1,000 mg total) by mouth every 12 (twelve) hours for 23 doses.   clarithromycin 500 MG tablet Commonly known as:  BIAXIN Take 1 tablet (500 mg total) by mouth every 12 (twelve) hours for 23 doses.   glucose blood test strip Commonly known as:  TRUE METRIX BLOOD GLUCOSE TEST Use as instructed   insulin NPH-regular Human (70-30) 100 UNIT/ML injection Commonly known as:  NOVOLIN 70/30 Inject 13 Units into skin two times daily with a meal What changed:    how much to take  how to take this  when to take this  additional instructions   Insulin Syringe-Needle U-100 30G X 5/16" 0.3 ML Misc Use as directed Replaces:  INSULIN SYRINGE .3CC/31GX5/16" 31G X 5/16" 0.3 ML Misc   lisinopril 5 MG tablet Commonly known as:  PRINIVIL,ZESTRIL Take 1 tablet (5 mg total) by mouth daily.   omeprazole 20 MG capsule Commonly known as:  PRILOSEC Take 1 capsule (20 mg total) by mouth 2 (two) times daily before a meal.   TRUE METRIX METER Devi 1 Device by Does not apply route 4 (four) times daily -  before meals and at bedtime.   TRUEPLUS LANCETS 28G Misc 1 Stick by Does not apply route 4 (four) times daily -  before meals and at bedtime.      No Known Allergies Follow-up Information    [facility]. Go on 06/15/2017.   Why:  Hospital Follow Up Appointment @ 2:15 pm- with MD [facility]- please call office if you are not able to keep schedued appointment.  Contact information: [address] [place] [address] [phone]           The results of significant diagnostics from this hospitalization (including imaging, microbiology, ancillary and laboratory) are listed below for  reference.    Significant Diagnostic Studies: Nm Gastric Emptying  Result Date: 06/06/2017 CLINICAL DATA:  Gastro paresis, generalized abdominal pain with nausea and vomiting. History of diabetes. EXAM: NUCLEAR MEDICINE GASTRIC EMPTYING SCAN TECHNIQUE: After oral ingestion of radiolabeled meal, sequential abdominal images were obtained for 120 minutes. Residual percentage of activity remaining within the stomach was calculated at 60 and 120 minutes. RADIOPHARMACEUTICALS:  12.7 mCi Tc-45msulfur colloid in standardized meal COMPARISON:  Abdominal CT scan of June 03, 2017 FINDINGS: Expected location of the stomach in the left upper quadrant. Ingested meal empties the stomach gradually over the course of the study with 5%% retention at 60 min and 2%% retention at 120 min (normal retention less than 30% at a 120 min). IMPRESSION: Normal gastric emptying study. Electronically Signed   By: David  JMartiniqueM.D.   On: 06/06/2017 13:50   Ct Abdomen Pelvis W Contrast  Result Date: 06/03/2017 CLINICAL  DATA:  Upper abdominal pain with nausea and vomiting over the last 3 days. Negative right upper quadrant ultrasound. EXAM: CT ABDOMEN AND PELVIS WITH CONTRAST TECHNIQUE: Multidetector CT imaging of the abdomen and pelvis was performed using the standard protocol following bolus administration of intravenous contrast. CONTRAST:  177m ISOVUE-300 IOPAMIDOL (ISOVUE-300) INJECTION 61% COMPARISON:  Ultrasound yesterday.  CT 12/27/2016. FINDINGS: Lower chest: Normal Hepatobiliary: Focal fatty change adjacent to the falciform ligament. No significant liver finding. No calcified gallstones. Pancreas: Normal Spleen: Normal Adrenals/Urinary Tract: Adrenal glands are normal. Kidneys are normal. Bladder is normal. Stomach/Bowel: No abnormal bowel finding. No evidence of obstruction or inflammatory disease. Appendix is normal. Vascular/Lymphatic: Aortic atherosclerosis, premature for age. No aneurysm. Stenotic pattern of the distal  aorta and proximal iliac arteries. Reproductive: Normal Other: No free fluid or air. Musculoskeletal: Normal IMPRESSION: No acute or significant finding by CT. No cause of upper abdominal pain, nausea and vomiting identified. Premature for age aortic atherosclerosis. Electronically Signed   By: MNelson ChimesM.D.   On: 06/03/2017 16:25   Dg Chest Port 1 View  Result Date: 06/02/2017 CLINICAL DATA:  Nausea and vomiting EXAM: PORTABLE CHEST 1 VIEW COMPARISON:  09/27/2005 FINDINGS: The heart size and mediastinal contours are within normal limits. Both lungs are clear. The visualized skeletal structures are unremarkable. IMPRESSION: No active disease. Electronically Signed   By: KDonavan FoilM.D.   On: 06/02/2017 00:39   UKoreaAbdomen Limited Ruq  Result Date: 06/02/2017 CLINICAL DATA:  Initial evaluation for abnormal LFTs. EXAM: ULTRASOUND ABDOMEN LIMITED RIGHT UPPER QUADRANT COMPARISON:  Prior CT from 12/27/2016. FINDINGS: Gallbladder: No gallstones or wall thickening visualized. No sonographic Murphy sign noted by sonographer. Common bile duct: Diameter: 4.2 mm Liver: No focal lesion identified. Within normal limits in parenchymal echogenicity. Portal vein is patent on color Doppler imaging with normal direction of blood flow towards the liver. IMPRESSION: Normal right upper quadrant ultrasound. Electronically Signed   By: BJeannine BogaM.D.   On: 06/02/2017 02:03    Microbiology: Recent Results (from the past 240 hour(s))  MRSA PCR Screening     Status: None   Collection Time: 06/02/17  5:27 PM  Result Value Ref Range Status   MRSA by PCR NEGATIVE NEGATIVE Final    Comment:        The GeneXpert MRSA Assay (FDA approved for NASAL specimens only), is one component of a comprehensive MRSA colonization surveillance program. It is not intended to diagnose MRSA infection nor to guide or monitor treatment for MRSA infections. Performed at [facility] [address] [place]  [id number]     Labs: Basic Metabolic Panel: Recent Labs  Lab 06/03/17 1030 06/03/17 1314 06/04/17 0345 06/05/17 0257 06/06/17 0357  NA 137 136 136 135 137  K 3.1* 3.1* 2.8* 3.0* 3.6  CL 102 101 100* 102 104  CO2 '22 23 24 23 23  ' GLUCOSE 154* 166* 151* 95 90  BUN <5* <5* <5* <5* 6  CREATININE 0.53 0.56 0.60 0.56 0.56  CALCIUM 8.9 9.1 8.7* 8.5* 8.5*   Liver Function Tests: Recent Labs  Lab 06/01/17 1935 06/03/17 0426  AST 53* 19  ALT 124* 65*  ALKPHOS 158* 127*  BILITOT 0.7 1.2  PROT 7.6 6.5  ALBUMIN 4.4 3.7   Recent Labs  Lab 06/01/17 1935  LIPASE 31   No results for input(s): AMMONIA in the last 168 hours. CBC: Recent Labs  Lab 06/01/17 1935 06/03/17 0426 06/04/17 0345 06/05/17 0257  06/06/17 0357  WBC 17.9* 12.8* 11.7* 12.1* 11.3*  NEUTROABS 16.6* 10.0* 8.3* 7.5 6.6  HGB 14.6 14.2 14.4 13.8 11.9*  HCT 43.0 42.5 42.6 40.8 36.0  MCV 92.9 93.4 94.0 93.2 93.8  PLT 451* 391 354 337 357   Cardiac Enzymes: Recent Labs  Lab 06/02/17 0109 06/02/17 1126  TROPONINI <0.03 <0.03   BNP: BNP (last 3 results) No results for input(s): BNP in the last 8760 hours.  ProBNP (last 3 results) No results for input(s): PROBNP in the last 8760 hours.  CBG: Recent Labs  Lab 06/05/17 2011 06/05/17 2350 06/06/17 0409 06/06/17 0802 06/06/17 1426  GLUCAP 226* 176* 88 117* 135*       Signed:  Alma Friendly, MD [facility] 06/06/2017, 3:38 PM

## 2017-06-06 NOTE — Progress Notes (Signed)
Inpatient Diabetes Program Recommendations  AACE/ADA: New Consensus Statement on Inpatient Glycemic Control (2015)  Target Ranges:  Prepandial:   less than 140 mg/dL      Peak postprandial:   less than 180 mg/dL (1-2 hours)      Critically ill patients:  140 - 180 mg/dL   Lab Results  Component Value Date   GLUCAP 135 (H) 06/06/2017   HGBA1C 9.3 (H) 06/02/2017    Review of Glycemic Control Results for Cheryl Parker, Cheryl Parker (MRN [id number]) as of 06/06/2017 15:12  Ref. Range 06/06/2017 04:09 06/06/2017 08:02 06/06/2017 13:47 06/06/2017 14:26  Glucose-Capillary Latest Ref Range: 65 - 99 mg/dL 88 409117 (H)  811135 (H)   Diabetes history: Type 2 DM, following GDM Outpatient Diabetes medications: Novolin 70/30 13 Units BID Current orders for Inpatient glycemic control: Novolog 0-5 units Q4H, Lantus 10 Units QD  Inpatient Diabetes Program Recommendations:    Spoke with patient regarding home diabetes management. Patient had difficulty obtaining medication from pharmacy since Medicaid ran out post pregnancy. Has already used MATCH. Patient was given information about [facility] for $25/vial.   Reviewed patient's current A1c of 9.3%. Explained what a A1c is and what it measures. Also, reviewed goal A1c with patient, importance of good glucose control @ home, blood sugar goals and the importance of sick day management. Additionally, discussed the pathophysiology behind DKA, DKA prevention, and why insulin is so important for a patient with diabetes. Discussed long term health conditions that could be impacted from diabetes.   Patient states that she is planning on getting a meter prior to discharge. However, also educated the patient about the [facility] products at [facility]. Patient said that this was a feasible option in the event she is unable to get one inpatient. Also, discussed that she may be able to get an additional meter with test strips at the [facility].  Patient [facility] and plans to go. Patient has no further questions at this time.  Thanks, Lujean RaveLauren Caeley Dohrmann, MSN, RNC-OB Diabetes Coordinator [phone] (8a-5p)

## 2017-06-06 NOTE — Care Management Note (Signed)
Case Management Note  Patient Details  Name: Cheryl Parker Staff MRN: [id number] Date of Birth: 1981/01/29  Subjective/Objective:   Pt presented for DKA- has not been taking medications- no insurance and is without PCP. [place] in [place], [place]. Plan will be to d/c home and [facility] and will be followed by [facility] Dr. Venetia NightAmao.               Action/Plan: Erskine SquibbJane with [facility] scheduled the appointment and was placed on AVS. Pt states she has transportation to the clinic. CM did speak with MD in regards to medications- pt will be able to pick up today at the clinic with cost ranging from $4.00-$10.00. Pt will get a Rx for Meter, lancets, syringes and 70/30. No further needs from CM at this time.   Expected Discharge Date:                  Expected Discharge Plan:  Home/Self Care  In-House Referral:  Financial Counselor  Discharge planning Services  CM Consult, Follow-up appt scheduled, Indigent Health Clinic, Medication Assistance  Post Acute Care Choice:  NA Choice offered to:  NA  DME Arranged:  N/A DME Agency:  NA  HH Arranged:  NA HH Agency:  NA  Status of Service:  Completed, signed off  If discussed at Long Length of Stay Meetings, dates discussed:    Additional Comments:  Gala LewandowskyGraves-Bigelow, Cheryl Fahs Kaye, RN 06/06/2017, 3:16 PM

## 2017-06-07 ENCOUNTER — Telehealth: Payer: Self-pay

## 2017-06-07 NOTE — Telephone Encounter (Signed)
Transitional Care Clinic Post-discharge Follow-Up Phone Call:  Date of Discharge: 06/06/2017 Principal Discharge Diagnosis(es):  DKA, abnormal liver function, HTN Post-discharge Communication: (Clearly document all attempts clearly and date contact made) call placed to # [phone] (H) and a HIPAA compliant voicemail message was left requesting a call back to # [phone].  Call Completed: No         As per Lauro RegulusShayla Smith, [facility] Pharmacy Tech the patient has picked up her medications.

## 2017-06-07 NOTE — Telephone Encounter (Signed)
Message received from the patient noting she was returning this CM call. Call returned to [phone] (H)  and a voicemail message was left requesting a call back to # [phone].

## 2017-06-08 ENCOUNTER — Telehealth: Payer: Self-pay

## 2017-06-08 NOTE — Telephone Encounter (Signed)
Transitional Care Clinic Post-discharge Follow-Up Phone Call:  Date of Discharge: 06/06/2017 Principal Discharge Diagnosis(es):  DKA, abnormal liver function, HTN Post-discharge Communication: (Clearly document all attempts clearly and date contact made) call placed to # [phone] (H) and a HIPAA compliant voicemail message was left requesting a call back to # [phone].  Call Completed: No

## 2017-06-08 NOTE — Telephone Encounter (Addendum)
Transitional Care Clinic Post-discharge Follow-Up Phone Call:  Date of Discharge: 06/06/2017 Principal Discharge Diagnosis(es): DKA, abnormal liver function, HTN Post-discharge Communication: (Clearly document all attempts clearly and date contact made) call received from the patient Call Completed: yes                     With Whom:Patient Interpreter Needed: No     Please check all that apply:  X  Patient is knowledgeable of his/her condition(s) and/or treatment. X  Patient is caring for self at home.  - she stated that she lives with her 3 children ( ages: 677,5,2) She also noted that she receives some assistance from her fiance and her father.  ? Patient is receiving assist at home from family and/or caregiver. Family and/or caregiver is knowledgeable of patient's condition(s) and/or treatment. ? Patient is receiving home health services. If so, name of agency.     Medication Reconciliation:  ? Medication list reviewed with patient. X  Patient obtained all discharge medications. If not, why? - she stated that she has all medications and does not have any questions about the medications. This CM offered to review her medication list and she said that she did not need to. She said that the nurse in the [facility] well [facility] reviewed medications with her.    Activities of Daily Living:  X  Independent ? Needs assist (describe; ? home DME used) ? Total Care (describe, ? home DME used)   Community resources in place for patient:  X  None  ? Home Health/Home DME ? Assisted Living ? Support Group          Patient Education: She stated that she has been checking her blood sugars as ordered and her blood sugars have been between 90-118. She stated that she keeps a log of her blood sugars even though the glucometer has a memory. She said she is feeling" fine."   This CM explained the services available at [facility] including social work, and financial counseling in  addition to pharmacy. Explained to her that if she is not able to afford the co-pays for her medications, she can put the charges on an account to pay off when she is able. This applies to most medications.  She stated that she does not have insurance at this time and is trying to get medicaid. She said that she is not working and has no income.  She explained that her father will provide her with money when needed.  She is currently looking for another " cheap "insurance."  Explained to her that she may be eligible for [employer] Financial Assistance, the Financial counselor can assist with that application.    She said that she has food and uses [employer] delivery for her groceries.         Questions/Concerns discussed:She confirmed [facility] appointment [facility] for next week - 06/15/17 @ 1415 and said that her father will be able to provide transportation.   She had no concerns or questions at this time.  ,

## 2017-06-09 ENCOUNTER — Inpatient Hospital Stay: Payer: Self-pay | Admitting: Family Medicine

## 2017-06-14 ENCOUNTER — Telehealth: Payer: Self-pay

## 2017-06-14 NOTE — Telephone Encounter (Signed)
Messages x 2 received from the patient. Stating she received messages about her appointment tomorrow but was not sure if her appointment was being cancelled due to the weather.  This CM returned call to the patient and noted that the appointment was not cancelled it was a confirmation call. CM call back # left - # [phone].

## 2017-06-15 ENCOUNTER — Inpatient Hospital Stay: Payer: Self-pay | Admitting: Family Medicine

## 2017-06-21 ENCOUNTER — Inpatient Hospital Stay: Payer: Self-pay | Admitting: Family Medicine

## 2017-06-24 ENCOUNTER — Telehealth: Payer: Self-pay | Admitting: General Practice

## 2017-06-24 NOTE — Telephone Encounter (Signed)
Call placed to patient #[phone], regarding rescheduling her hospital follow up appointment with [facility] clinic. No answer. Left patient a message asking her to return my call at [phone].

## 2017-06-30 ENCOUNTER — Telehealth: Payer: Self-pay | Admitting: General Practice

## 2017-06-30 NOTE — Telephone Encounter (Signed)
Call placed to patient #[phone] regarding scheduling a hospital follow up in the [facility] clinic. No answer. Left patient a message asking her to return my call at [phone].

## 2017-06-30 NOTE — Telephone Encounter (Signed)
Patient returned my call and hospital follow up appointment was scheduled for March 13 at 2:00pm.

## 2017-07-06 ENCOUNTER — Inpatient Hospital Stay: Payer: Self-pay | Admitting: Family Medicine

## 2017-07-25 DIAGNOSIS — R112 Nausea with vomiting, unspecified: Secondary | ICD-10-CM

## 2017-07-25 HISTORY — DX: Nausea with vomiting, unspecified: R11.2

## 2017-08-15 ENCOUNTER — Inpatient Hospital Stay (HOSPITAL_COMMUNITY)
Admission: EM | Admit: 2017-08-15 | Discharge: 2017-08-18 | DRG: 641 | Disposition: A | Discharge status: Home / Self Care | Payer: Self-pay | Attending: Family Medicine | Admitting: Family Medicine

## 2017-08-15 ENCOUNTER — Encounter (HOSPITAL_COMMUNITY): Payer: Self-pay

## 2017-08-15 DIAGNOSIS — E1159 Type 2 diabetes mellitus with other circulatory complications: Secondary | ICD-10-CM | POA: Diagnosis present

## 2017-08-15 DIAGNOSIS — K5900 Constipation, unspecified: Secondary | ICD-10-CM | POA: Diagnosis present

## 2017-08-15 DIAGNOSIS — E1165 Type 2 diabetes mellitus with hyperglycemia: Secondary | ICD-10-CM | POA: Diagnosis present

## 2017-08-15 DIAGNOSIS — K3184 Gastroparesis: Secondary | ICD-10-CM | POA: Diagnosis present

## 2017-08-15 DIAGNOSIS — K219 Gastro-esophageal reflux disease without esophagitis: Secondary | ICD-10-CM | POA: Diagnosis present

## 2017-08-15 DIAGNOSIS — E876 Hypokalemia: Secondary | ICD-10-CM | POA: Diagnosis present

## 2017-08-15 DIAGNOSIS — Z79899 Other long term (current) drug therapy: Secondary | ICD-10-CM

## 2017-08-15 DIAGNOSIS — Z794 Long term (current) use of insulin: Secondary | ICD-10-CM

## 2017-08-15 DIAGNOSIS — E1043 Type 1 diabetes mellitus with diabetic autonomic (poly)neuropathy: Secondary | ICD-10-CM | POA: Diagnosis present

## 2017-08-15 DIAGNOSIS — Z833 Family history of diabetes mellitus: Secondary | ICD-10-CM

## 2017-08-15 DIAGNOSIS — R824 Acetonuria: Secondary | ICD-10-CM

## 2017-08-15 DIAGNOSIS — I10 Essential (primary) hypertension: Secondary | ICD-10-CM | POA: Diagnosis present

## 2017-08-15 DIAGNOSIS — D72829 Elevated white blood cell count, unspecified: Secondary | ICD-10-CM | POA: Diagnosis present

## 2017-08-15 DIAGNOSIS — F1290 Cannabis use, unspecified, uncomplicated: Secondary | ICD-10-CM | POA: Diagnosis present

## 2017-08-15 DIAGNOSIS — Z87891 Personal history of nicotine dependence: Secondary | ICD-10-CM

## 2017-08-15 DIAGNOSIS — E861 Hypovolemia: Secondary | ICD-10-CM | POA: Diagnosis present

## 2017-08-15 DIAGNOSIS — E1065 Type 1 diabetes mellitus with hyperglycemia: Secondary | ICD-10-CM | POA: Diagnosis present

## 2017-08-15 DIAGNOSIS — R0602 Shortness of breath: Secondary | ICD-10-CM

## 2017-08-15 DIAGNOSIS — E860 Dehydration: Principal | ICD-10-CM | POA: Diagnosis present

## 2017-08-15 DIAGNOSIS — R112 Nausea with vomiting, unspecified: Secondary | ICD-10-CM | POA: Diagnosis present

## 2017-08-15 HISTORY — DX: Nausea with vomiting, unspecified: R11.2

## 2017-08-15 LAB — URINALYSIS, ROUTINE W REFLEX MICROSCOPIC
BACTERIA UA: NONE SEEN
BILIRUBIN URINE: NEGATIVE
Glucose, UA: >=500 mg/dL — AB
Ketones, ur: 80 mg/dL — AB
Leukocytes, UA: NEGATIVE
Nitrite: NEGATIVE
Protein, ur: 30 mg/dL — AB
SPECIFIC GRAVITY, URINE: 1.025 (ref 1.005–1.030)
pH: 8 (ref 5.0–8.0)

## 2017-08-15 LAB — CBC
HEMATOCRIT: 41.2 % (ref 36.0–46.0)
Hemoglobin: 14 g/dL (ref 12.0–15.0)
MCH: 31.2 pg (ref 26.0–34.0)
MCHC: 34 g/dL (ref 30.0–36.0)
MCV: 91.8 fL (ref 78.0–100.0)
Platelets: 409 10*3/uL — ABNORMAL HIGH (ref 150–400)
RBC: 4.49 MIL/uL (ref 3.87–5.11)
RDW: 12.3 % (ref 11.5–15.5)
WBC: 13.8 10*3/uL — ABNORMAL HIGH (ref 4.0–10.5)

## 2017-08-15 LAB — COMPREHENSIVE METABOLIC PANEL
ALBUMIN: 4.1 g/dL (ref 3.5–5.0)
ALT: 39 U/L (ref 14–54)
AST: 23 U/L (ref 15–41)
Alkaline Phosphatase: 87 U/L (ref 38–126)
Anion gap: 13 (ref 5–15)
BUN: 5 mg/dL — AB (ref 6–20)
CHLORIDE: 105 mmol/L (ref 101–111)
CO2: 18 mmol/L — ABNORMAL LOW (ref 22–32)
Calcium: 9.1 mg/dL (ref 8.9–10.3)
Creatinine, Ser: 0.63 mg/dL (ref 0.44–1.00)
GFR calc Af Amer: >60 mL/min (ref >60)
GFR calc non Af Amer: >60 mL/min (ref >60)
GLUCOSE: 318 mg/dL — AB (ref 65–99)
POTASSIUM: 3.8 mmol/L (ref 3.5–5.1)
Sodium: 136 mmol/L (ref 135–145)
Total Bilirubin: 0.5 mg/dL (ref 0.3–1.2)
Total Protein: 7 g/dL (ref 6.5–8.1)

## 2017-08-15 LAB — BASIC METABOLIC PANEL
Anion gap: 15 (ref 5–15)
BUN: 5 mg/dL — ABNORMAL LOW (ref 6–20)
CO2: 18 mmol/L — ABNORMAL LOW (ref 22–32)
Calcium: 9.3 mg/dL (ref 8.9–10.3)
Chloride: 106 mmol/L (ref 101–111)
Creatinine, Ser: 0.73 mg/dL (ref 0.44–1.00)
GFR calc Af Amer: >60 mL/min (ref >60)
Glucose, Bld: 266 mg/dL — ABNORMAL HIGH (ref 65–99)
POTASSIUM: 4.4 mmol/L (ref 3.5–5.1)
Sodium: 139 mmol/L (ref 135–145)

## 2017-08-15 LAB — I-STAT BETA HCG BLOOD, ED (MC, WL, AP ONLY): I-stat hCG, quantitative: <5 m[IU]/mL (ref <5)

## 2017-08-15 LAB — I-STAT TROPONIN, ED: Troponin i, poc: 0 ng/mL (ref 0.00–0.08)

## 2017-08-15 LAB — CBG MONITORING, ED
GLUCOSE-CAPILLARY: 263 mg/dL — AB (ref 65–99)
Glucose-Capillary: 255 mg/dL — ABNORMAL HIGH (ref 65–99)
Glucose-Capillary: 238 mg/dL — ABNORMAL HIGH (ref 65–99)

## 2017-08-15 LAB — LIPASE, BLOOD: LIPASE: 47 U/L (ref 11–51)

## 2017-08-15 MED ORDER — METOCLOPRAMIDE HCL 5 MG/ML IJ SOLN
10.0000 mg | Freq: Once | INTRAMUSCULAR | Status: AC
  Administered 2017-08-15: 10 mg via INTRAVENOUS
  Filled 2017-08-15: qty 2

## 2017-08-15 MED ORDER — HALOPERIDOL LACTATE 5 MG/ML IJ SOLN
5.0000 mg | Freq: Once | INTRAMUSCULAR | Status: AC
  Administered 2017-08-15: 5 mg via INTRAVENOUS
  Filled 2017-08-15: qty 1

## 2017-08-15 MED ORDER — SODIUM CHLORIDE 0.9 % IV BOLUS
1000.0000 mL | Freq: Once | INTRAVENOUS | Status: AC
  Administered 2017-08-15: 1000 mL via INTRAVENOUS

## 2017-08-15 MED ORDER — LORAZEPAM 2 MG/ML IJ SOLN
0.5000 mg | Freq: Once | INTRAMUSCULAR | Status: AC
  Administered 2017-08-15: 0.5 mg via INTRAVENOUS
  Filled 2017-08-15: qty 1

## 2017-08-15 MED ORDER — DIPHENHYDRAMINE HCL 50 MG/ML IJ SOLN
25.0000 mg | Freq: Once | INTRAMUSCULAR | Status: AC
  Administered 2017-08-15: 25 mg via INTRAVENOUS
  Filled 2017-08-15: qty 1

## 2017-08-15 MED ORDER — SODIUM CHLORIDE 0.9 % IV SOLN
Freq: Once | INTRAVENOUS | Status: AC
  Administered 2017-08-15: 23:00:00 via INTRAVENOUS

## 2017-08-15 MED ORDER — ONDANSETRON 4 MG PO TBDP
4.0000 mg | ORAL_TABLET | Freq: Once | ORAL | Status: AC
  Administered 2017-08-15: 4 mg via ORAL
  Filled 2017-08-15: qty 1

## 2017-08-15 MED ORDER — INSULIN ASPART 100 UNIT/ML ~~LOC~~ SOLN
5.0000 [IU] | Freq: Once | SUBCUTANEOUS | Status: AC
  Administered 2017-08-15: 5 [IU] via INTRAVENOUS
  Filled 2017-08-15: qty 1

## 2017-08-15 NOTE — ED Notes (Signed)
CBG 238 RN Notified.

## 2017-08-15 NOTE — ED Triage Notes (Signed)
Pt presents to ED with generalized abdominal pain and vomiting x 2 days. Pt states she was recently here for DKA and this feels the same.

## 2017-08-15 NOTE — ED Notes (Signed)
No reply for vitals 

## 2017-08-15 NOTE — ED Notes (Signed)
Pt called in lobby for urine specimen collection. No response.

## 2017-08-15 NOTE — ED Provider Notes (Signed)
[facility] EMERGENCY DEPARTMENT Provider Note   CSN: [id number] Arrival date & time: 08/15/17  1210     History   Chief Complaint Chief Complaint  Patient presents with  . Emesis    HPI Cheryl Parker is a 37 y.o. female.  HPI 37 year old female with past medical history of diabetes with recurrent DKA here with nausea and vomiting.  Patient states that for the last 3 days, she had persistently worsening nausea, vomiting, and inability to eat or drink.  She had associated intermittent cramp-like abdominal pain.  She has a history of DKA with similar symptoms in the past.  She strongly denies any fevers.  She has not had any cough or sputum production.  She says she is been using her insulin as prescribed.  States her symptoms feel similar to her previous episodes of DKA.  No other complaints.  No recent change in her insulin.  Denies any abdominal pain currently.  Past Medical History:  Diagnosis Date  . DKA (diabetic ketoacidoses) (HCC) 09/10/2014; 06/02/2017  . GERD (gastroesophageal reflux disease)   . Gestational diabetes   . Headache   . Hypertension   . Pregnancy induced hypertension   . Type 1 diabetes mellitus (HCC)    Insulin dependent    Patient Active Problem List   Diagnosis Date Noted  . Abnormal liver function 06/01/2017  . Diabetic ketoacidosis (HCC) 12/25/2016  . Transaminitis 12/24/2016  . Essential hypertension 08/04/2015  . Status post repeat low transverse cesarean section 06/25/2015  . Intractable nausea and vomiting 02/10/2015  . Marijuana use 01/08/2015  . Dehydration with hyponatremia 11/16/2014  . HLD (hyperlipidemia)   . Type 2 diabetes mellitus with hyperglycemia (HCC)   . DKA (diabetic ketoacidoses) (HCC) 09/10/2014    Past Surgical History:  Procedure Laterality Date  . CESAREAN SECTION  2011  . CESAREAN SECTION N/A 07/05/2012   Procedure: CESAREAN SECTION;  Surgeon: Catalina Antigua, MD;  Location: [facility];  Service:  Obstetrics;  Laterality: N/A;  . CESAREAN SECTION N/A 06/25/2015   Procedure: CESAREAN SECTION;  Surgeon: Catalina Antigua, MD;  Location: [facility];  Service: Obstetrics;  Laterality: N/A;  . ESOPHAGOGASTRODUODENOSCOPY (EGD) WITH PROPOFOL Left 12/28/2016   Procedure: ESOPHAGOGASTRODUODENOSCOPY (EGD) WITH PROPOFOL;  Surgeon: Kerin Salen, MD;  Location: [facility] ENDOSCOPY;  Service: Gastroenterology;  Laterality: Left;     OB History    Gravida  3   Para  3   Term  3   Preterm  0   AB  0   Living  3     SAB  0   TAB  0   Ectopic  0   Multiple  0   Live Births  3            Home Medications    Prior to Admission medications   Medication Sig Start Date End Date Taking? Authorizing Provider  acetaminophen (TYLENOL) 500 MG tablet Take 1,000 mg by mouth every 6 (six) hours as needed for mild pain.   Yes [provider]  insulin NPH-regular Human (NOVOLIN 70/30) (70-30) 100 UNIT/ML injection Inject 13 Units into skin two times daily with a meal Patient taking differently: Inject 13 Units into the skin 3 (three) times daily. Inject 13 Units into skin two times daily with a meal 06/06/17  Yes Briant Cedar, MD  omeprazole (PRILOSEC) 20 MG capsule Take 1 capsule (20 mg total) by mouth 2 (two) times daily before a meal. 06/06/17 08/15/17 Yes Briant Cedar,  MD  lisinopril (PRINIVIL,ZESTRIL) 5 MG tablet Take 1 tablet (5 mg total) by mouth daily. Patient not taking: Reported on 06/02/2017 12/29/16   Tyrone Nine, MD    Family History Family History  Problem Relation Age of Onset  . Diabetes Mother   . Kidney disease Father   . Birth defects Son        unilateral renal agenesis    Social History Social History   Tobacco Use  . Smoking status: Former Smoker    Packs/day: 0.10    Years: 17.00    Pack years: 1.70    Types: Cigarettes  . Smokeless tobacco: Never Used  . Tobacco comment: 06/02/2017 "quit ~ 6 months ago"  Substance Use Topics  . Alcohol use: No  .  Drug use: No     Allergies   Patient has no known allergies.   Review of Systems Review of Systems  Constitutional: Positive for fatigue.  Gastrointestinal: Positive for abdominal pain, nausea and vomiting.  Neurological: Positive for weakness.  All other systems reviewed and are negative.    Physical Exam Updated Vital Signs BP 128/84 (BP Location: Left Arm)   Pulse 67   Temp 98.2 F (36.8 C) (Oral)   Resp 18   Ht 5\' 2"  (1.575 m)   Wt 70.1 kg (154 lb 8.7 oz)   LMP 06/24/2017   SpO2 100%   BMI 28.27 kg/m   Physical Exam  Constitutional: She is oriented to person, place, and time. She appears well-developed and well-nourished. No distress.  HENT:  Head: Normocephalic and atraumatic.  Dry MM  Eyes: Conjunctivae are normal.  Neck: Neck supple.  Cardiovascular: Normal rate, regular rhythm and normal heart sounds. Exam reveals no friction rub.  No murmur heard. Pulmonary/Chest: Effort normal and breath sounds normal. No respiratory distress. She has no wheezes. She has no rales.  Abdominal: She exhibits no distension.  Musculoskeletal: She exhibits no edema.  Neurological: She is alert and oriented to person, place, and time. She exhibits normal muscle tone.  Skin: Skin is warm. Capillary refill takes less than 2 seconds.  Psychiatric: She has a normal mood and affect.  Nursing note and vitals reviewed.    ED Treatments / Results  Labs (all labs ordered are listed, but only abnormal results are displayed) Labs Reviewed  COMPREHENSIVE METABOLIC PANEL - Abnormal; Notable for the following components:      Result Value   CO2 18 (*)    Glucose, Bld 318 (*)    BUN 5 (*)    All other components within normal limits  CBC - Abnormal; Notable for the following components:   WBC 13.8 (*)    Platelets 409 (*)    All other components within normal limits  URINALYSIS, ROUTINE W REFLEX MICROSCOPIC - Abnormal; Notable for the following components:   Color, Urine STRAW (*)     APPearance HAZY (*)    Glucose, UA >=500 (*)    Hgb urine dipstick SMALL (*)    Ketones, ur 80 (*)    Protein, ur 30 (*)    Squamous Epithelial / LPF 6-30 (*)    All other components within normal limits  BASIC METABOLIC PANEL - Abnormal; Notable for the following components:   CO2 18 (*)    Glucose, Bld 266 (*)    BUN 5 (*)    All other components within normal limits  CBG MONITORING, ED - Abnormal; Notable for the following components:   Glucose-Capillary 263 (*)  All other components within normal limits  CBG MONITORING, ED - Abnormal; Notable for the following components:   Glucose-Capillary 255 (*)    All other components within normal limits  I-STAT VENOUS BLOOD GAS, ED - Abnormal; Notable for the following components:   pCO2, Ven 33.3 (*)    Bicarbonate 19.2 (*)    TCO2 20 (*)    Acid-base deficit 5.0 (*)    All other components within normal limits  CBG MONITORING, ED - Abnormal; Notable for the following components:   Glucose-Capillary 238 (*)    All other components within normal limits  CBG MONITORING, ED - Abnormal; Notable for the following components:   Glucose-Capillary 215 (*)    All other components within normal limits  LIPASE, BLOOD  RAPID URINE DRUG SCREEN, HOSP PERFORMED  BASIC METABOLIC PANEL  CBC WITH DIFFERENTIAL/PLATELET  I-STAT BETA HCG BLOOD, ED ([facility], [facility], [facility] ONLY)  I-STAT TROPONIN, ED    EKG None  Radiology No results found.  Procedures Procedures (including critical care time)  Medications Ordered in ED Medications  ondansetron (ZOFRAN) injection 4 mg (4 mg Intravenous Given 08/16/17 0150)  lisinopril (PRINIVIL,ZESTRIL) tablet 5 mg (has no administration in time range)  pantoprazole (PROTONIX) EC tablet 40 mg (has no administration in time range)  insulin detemir (LEVEMIR) injection 8 Units (has no administration in time range)  insulin aspart (novoLOG) injection 0-15 Units (has no administration in time range)  enoxaparin  (LOVENOX) injection 40 mg (has no administration in time range)  0.9 % NaCl with KCl 20 mEq/ L  infusion (has no administration in time range)  acetaminophen (TYLENOL) tablet 650 mg (has no administration in time range)    Or  acetaminophen (TYLENOL) suppository 650 mg (has no administration in time range)  promethazine (PHENERGAN) injection 12.5-25 mg (has no administration in time range)  metoCLOPramide (REGLAN) injection 10 mg (has no administration in time range)  hydrALAZINE (APRESOLINE) injection 10 mg (has no administration in time range)  ondansetron (ZOFRAN-ODT) disintegrating tablet 4 mg (4 mg Oral Given 08/15/17 1246)  sodium chloride 0.9 % bolus 1,000 mL (0 mLs Intravenous Stopped 08/15/17 1619)  metoCLOPramide (REGLAN) injection 10 mg (10 mg Intravenous Given 08/15/17 1519)  diphenhydrAMINE (BENADRYL) injection 25 mg (25 mg Intravenous Given 08/15/17 1519)  LORazepam (ATIVAN) injection 0.5 mg (0.5 mg Intravenous Given 08/15/17 1743)  sodium chloride 0.9 % bolus 1,000 mL (0 mLs Intravenous Stopped 08/15/17 2307)  haloperidol lactate (HALDOL) injection 5 mg (5 mg Intravenous Given 08/15/17 2315)  0.9 %  sodium chloride infusion ( Intravenous Stopped 08/16/17 0244)  insulin aspart (novoLOG) injection 5 Units (5 Units Intravenous Given 08/15/17 2315)     Initial Impression / Assessment and Plan / ED Course  I have reviewed the triage vital signs and the nursing notes.  Pertinent labs & imaging results that were available during my care of the patient were reviewed by me and considered in my medical decision making (see chart for details).     37 yo F here with n/v and hyperglycemia. Labs show CO2 18, Glu > 250, AG 12-13 however. Marked ketonuria on UA. Concern for dehydration, hyperglycemia. Pt with h/o recurrent DKA with similar presentations. She is persistently vomiting after IV antiemetics, IV fluids. Repeat BMP without DKA. Will give insulin, admit for sx control, monitoring of  intractable n/v.  Final Clinical Impressions(s) / ED Diagnoses   Final diagnoses:  Intractable vomiting with nausea, unspecified vomiting type  Dehydration  [facility]    ED  Discharge Orders    None       Shaune Pollack, MD 08/16/17 [phone]

## 2017-08-16 ENCOUNTER — Encounter (HOSPITAL_COMMUNITY): Payer: Self-pay | Admitting: Family Medicine

## 2017-08-16 ENCOUNTER — Other Ambulatory Visit: Payer: Self-pay

## 2017-08-16 DIAGNOSIS — E860 Dehydration: Principal | ICD-10-CM

## 2017-08-16 DIAGNOSIS — E876 Hypokalemia: Secondary | ICD-10-CM

## 2017-08-16 DIAGNOSIS — E1143 Type 2 diabetes mellitus with diabetic autonomic (poly)neuropathy: Secondary | ICD-10-CM

## 2017-08-16 DIAGNOSIS — Z794 Long term (current) use of insulin: Secondary | ICD-10-CM

## 2017-08-16 DIAGNOSIS — I10 Essential (primary) hypertension: Secondary | ICD-10-CM

## 2017-08-16 DIAGNOSIS — K3184 Gastroparesis: Secondary | ICD-10-CM

## 2017-08-16 DIAGNOSIS — E1165 Type 2 diabetes mellitus with hyperglycemia: Secondary | ICD-10-CM

## 2017-08-16 LAB — I-STAT VENOUS BLOOD GAS, ED
Acid-base deficit: 5 mmol/L — ABNORMAL HIGH (ref 0.0–2.0)
BICARBONATE: 19.2 mmol/L — AB (ref 20.0–28.0)
O2 Saturation: 79 %
PH VEN: 7.369 (ref 7.250–7.430)
PO2 VEN: 44 mmHg (ref 32.0–45.0)
TCO2: 20 mmol/L — ABNORMAL LOW (ref 22–32)
pCO2, Ven: 33.3 mmHg — ABNORMAL LOW (ref 44.0–60.0)

## 2017-08-16 LAB — CBC WITH DIFFERENTIAL/PLATELET
BASOS PCT: 0 %
Basophils Absolute: 0 10*3/uL (ref 0.0–0.1)
EOS ABS: 0 10*3/uL (ref 0.0–0.7)
Eosinophils Relative: 0 %
HEMATOCRIT: 38.2 % (ref 36.0–46.0)
HEMOGLOBIN: 12.8 g/dL (ref 12.0–15.0)
Lymphocytes Relative: 7 %
Lymphs Abs: 1 10*3/uL (ref 0.7–4.0)
MCH: 30.9 pg (ref 26.0–34.0)
MCHC: 33.5 g/dL (ref 30.0–36.0)
MCV: 92.3 fL (ref 78.0–100.0)
MONOS PCT: 2 %
Monocytes Absolute: 0.3 10*3/uL (ref 0.1–1.0)
NEUTROS PCT: 91 %
Neutro Abs: 14 10*3/uL — ABNORMAL HIGH (ref 1.7–7.7)
Platelets: 363 10*3/uL (ref 150–400)
RBC: 4.14 MIL/uL (ref 3.87–5.11)
RDW: 12.6 % (ref 11.5–15.5)
WBC: 15.4 10*3/uL — AB (ref 4.0–10.5)

## 2017-08-16 LAB — GLUCOSE, CAPILLARY
GLUCOSE-CAPILLARY: 201 mg/dL — AB (ref 65–99)
GLUCOSE-CAPILLARY: 217 mg/dL — AB (ref 65–99)
GLUCOSE-CAPILLARY: 200 mg/dL — AB (ref 65–99)
GLUCOSE-CAPILLARY: 147 mg/dL — AB (ref 65–99)
Glucose-Capillary: 171 mg/dL — ABNORMAL HIGH (ref 65–99)
Glucose-Capillary: 198 mg/dL — ABNORMAL HIGH (ref 65–99)

## 2017-08-16 LAB — BASIC METABOLIC PANEL
Anion gap: 12 (ref 5–15)
BUN: <5 mg/dL — ABNORMAL LOW (ref 6–20)
CALCIUM: 8.7 mg/dL — AB (ref 8.9–10.3)
CO2: 19 mmol/L — AB (ref 22–32)
Chloride: 107 mmol/L (ref 101–111)
Creatinine, Ser: 0.61 mg/dL (ref 0.44–1.00)
GFR calc non Af Amer: >60 mL/min (ref >60)
Glucose, Bld: 228 mg/dL — ABNORMAL HIGH (ref 65–99)
Potassium: 3.4 mmol/L — ABNORMAL LOW (ref 3.5–5.1)
SODIUM: 138 mmol/L (ref 135–145)

## 2017-08-16 LAB — RAPID URINE DRUG SCREEN, HOSP PERFORMED
Amphetamines: NOT DETECTED
Barbiturates: NOT DETECTED
Benzodiazepines: NOT DETECTED
Cocaine: NOT DETECTED
Opiates: NOT DETECTED
Tetrahydrocannabinol: POSITIVE — AB

## 2017-08-16 LAB — CBG MONITORING, ED: GLUCOSE-CAPILLARY: 215 mg/dL — AB (ref 65–99)

## 2017-08-16 MED ORDER — PANTOPRAZOLE SODIUM 40 MG PO TBEC
40.0000 mg | DELAYED_RELEASE_TABLET | Freq: Two times a day (BID) | ORAL | Status: DC
  Administered 2017-08-16 – 2017-08-18 (×5): 40 mg via ORAL
  Filled 2017-08-16 (×5): qty 1

## 2017-08-16 MED ORDER — INSULIN ASPART 100 UNIT/ML ~~LOC~~ SOLN
0.0000 [IU] | SUBCUTANEOUS | Status: DC
  Administered 2017-08-16: 2 [IU] via SUBCUTANEOUS
  Administered 2017-08-16: 5 [IU] via SUBCUTANEOUS
  Administered 2017-08-16: 3 [IU] via SUBCUTANEOUS
  Administered 2017-08-16 (×2): 5 [IU] via SUBCUTANEOUS
  Administered 2017-08-17 (×2): 3 [IU] via SUBCUTANEOUS
  Administered 2017-08-17: 5 [IU] via SUBCUTANEOUS
  Administered 2017-08-17 (×2): 3 [IU] via SUBCUTANEOUS

## 2017-08-16 MED ORDER — ONDANSETRON HCL 4 MG/2ML IJ SOLN
4.0000 mg | Freq: Four times a day (QID) | INTRAMUSCULAR | Status: DC | PRN
  Administered 2017-08-16 (×2): 4 mg via INTRAVENOUS
  Filled 2017-08-16 (×2): qty 2

## 2017-08-16 MED ORDER — POTASSIUM CHLORIDE IN NACL 20-0.9 MEQ/L-% IV SOLN
INTRAVENOUS | Status: DC
  Administered 2017-08-16: 1 mL via INTRAVENOUS
  Administered 2017-08-16: 03:00:00 via INTRAVENOUS
  Filled 2017-08-16 (×2): qty 1000

## 2017-08-16 MED ORDER — METOCLOPRAMIDE HCL 5 MG/ML IJ SOLN
5.0000 mg | Freq: Four times a day (QID) | INTRAMUSCULAR | Status: DC
  Administered 2017-08-16 – 2017-08-17 (×3): 5 mg via INTRAVENOUS
  Filled 2017-08-16 (×3): qty 2

## 2017-08-16 MED ORDER — PROMETHAZINE HCL 25 MG/ML IJ SOLN
12.5000 mg | Freq: Four times a day (QID) | INTRAMUSCULAR | Status: DC | PRN
  Administered 2017-08-16 (×2): 25 mg via INTRAVENOUS
  Filled 2017-08-16 (×2): qty 1

## 2017-08-16 MED ORDER — SODIUM CHLORIDE 0.9 % IV SOLN
INTRAVENOUS | Status: DC
  Administered 2017-08-16 – 2017-08-17 (×2): via INTRAVENOUS

## 2017-08-16 MED ORDER — ONDANSETRON HCL 4 MG/2ML IJ SOLN
4.0000 mg | Freq: Once | INTRAMUSCULAR | Status: AC
  Administered 2017-08-16: 4 mg via INTRAVENOUS
  Filled 2017-08-16: qty 2

## 2017-08-16 MED ORDER — ACETAMINOPHEN 325 MG PO TABS
650.0000 mg | ORAL_TABLET | Freq: Four times a day (QID) | ORAL | Status: DC | PRN
  Administered 2017-08-18 (×2): 650 mg via ORAL
  Filled 2017-08-16 (×3): qty 2

## 2017-08-16 MED ORDER — POTASSIUM CHLORIDE 10 MEQ/100ML IV SOLN
10.0000 meq | INTRAVENOUS | Status: AC
  Administered 2017-08-16 (×4): 10 meq via INTRAVENOUS
  Filled 2017-08-16 (×4): qty 100

## 2017-08-16 MED ORDER — LISINOPRIL 5 MG PO TABS
5.0000 mg | ORAL_TABLET | Freq: Every day | ORAL | Status: DC
  Administered 2017-08-16 – 2017-08-18 (×3): 5 mg via ORAL
  Filled 2017-08-16 (×3): qty 1

## 2017-08-16 MED ORDER — ACETAMINOPHEN 650 MG RE SUPP
650.0000 mg | Freq: Four times a day (QID) | RECTAL | Status: DC | PRN
  Administered 2017-08-16 – 2017-08-17 (×2): 650 mg via RECTAL
  Filled 2017-08-16 (×2): qty 1

## 2017-08-16 MED ORDER — POTASSIUM CHLORIDE CRYS ER 20 MEQ PO TBCR
40.0000 meq | EXTENDED_RELEASE_TABLET | Freq: Once | ORAL | Status: DC
  Filled 2017-08-16: qty 2

## 2017-08-16 MED ORDER — HYDRALAZINE HCL 20 MG/ML IJ SOLN: 10.0000 mg | INTRAMUSCULAR | Status: DC | PRN

## 2017-08-16 MED ORDER — INSULIN DETEMIR 100 UNIT/ML ~~LOC~~ SOLN
8.0000 [IU] | Freq: Two times a day (BID) | SUBCUTANEOUS | Status: DC
  Administered 2017-08-16 – 2017-08-17 (×4): 8 [IU] via SUBCUTANEOUS
  Filled 2017-08-16 (×5): qty 0.08

## 2017-08-16 MED ORDER — ENOXAPARIN SODIUM 40 MG/0.4ML ~~LOC~~ SOLN
40.0000 mg | SUBCUTANEOUS | Status: DC
  Administered 2017-08-16 – 2017-08-17 (×2): 40 mg via SUBCUTANEOUS
  Filled 2017-08-16 (×2): qty 0.4

## 2017-08-16 MED ORDER — METOCLOPRAMIDE HCL 5 MG/ML IJ SOLN
10.0000 mg | Freq: Three times a day (TID) | INTRAMUSCULAR | Status: DC
  Administered 2017-08-16 (×2): 10 mg via INTRAVENOUS
  Filled 2017-08-16 (×2): qty 2

## 2017-08-16 MED ORDER — ONDANSETRON HCL 4 MG/2ML IJ SOLN
4.0000 mg | Freq: Four times a day (QID) | INTRAMUSCULAR | Status: DC | PRN
Start: 2017-08-16 — End: 2017-08-18
  Administered 2017-08-16 – 2017-08-17 (×4): 4 mg via INTRAVENOUS
  Filled 2017-08-16 (×4): qty 2

## 2017-08-16 NOTE — Progress Notes (Signed)
Pt vomited shortly after having the iv reglan

## 2017-08-16 NOTE — Progress Notes (Signed)
PROGRESS NOTE    Cheryl Parker  ZOX:[id number] DOB: 08/12/1980 DOA: 08/15/2017 PCP: Patient, No Pcp Per  Outpatient Specialists:     Brief Narrative:  Patient is a 37 y.o. female with past medical history significant for poorly controlled insulin-dependent diabetes mellitus, and hypertension.  Patient is admitted with 2 day history of abdominal discomfort, nausea, and vomiting. Patient continues to report epigastric pain (likely from N/V), nausea and vomiting. Consider gastric emptying study prior to discharge.  Will continue IV Reglan round the clock.  Please follow QTc closely while on Reglan or other antiemetics. Potassium of 3.4 noted.  Assessment & Plan:   Principal Problem:   Intractable nausea and vomiting Active Problems:   Type 2 diabetes mellitus with hyperglycemia (HCC)   Essential hypertension   1. Intractable nausea and vomiting  - Presents with 2 days of general abd discomfort, nausea, and non-bloody vomiting  - ED workup notable for hyperglycmia and she appears hypovolemic  - Abdominal exam is benign  - Treated in ED with IVF, Zofran, Reglan, Ativan, and Haldol but continues to complain of severe nausea  - Exam benign, possibly secondary to gastroparesis, marijuana  - Schedule Reglan, continue IVF hydration, prn antiemetics, monitor lytes  -04 06/18/2017: As much as possible, with try avoiding using different types of antiemetics. -Will use IV Reglan around-the-clock for now, considering likelihood of diabetic gastroparesis.  2. Insulin-dependent DM, hyperglycemia  - A1c was 9.3% in February  - Managed at home with Novolin 70/30 13 units TID  - Follow CBG's and use Levemir 8 units BID plus SSI to start while in hospital  -08/16/2017: Continue to optimize.  3. Hypertension   - BP elevated in ED in setting of N/V  - Continue lisinopril, use hydralazine IVP's prn    4.  Hypokalemia: Replete. Continue to monitor electrolytes, and replete as deemed  appropriate  DVT prophylaxis: Lovenox Code Status: Full Family Communication: Mother, Grand Mother and Auntie Disposition Plan: Home eventually   Consultants:   None  Procedures:   None  Antimicrobials:   None   Subjective: Patient is seen alongside patient's mother, grandmother and Auntie.  Patient continues to report nausea, vomiting and epigastric discomfort.  Objective: Vitals:   08/16/17 0100 08/16/17 0302 08/16/17 0408 08/16/17 1211  BP:  113/68 (!) 177/102 (!) 157/77  Pulse:  (!) 113 (!) 129 90  Resp:  16 16 16   Temp:  99.4 F (37.4 C) 99.6 F (37.6 C) 99.2 F (37.3 C)  TempSrc:  Oral Oral Oral  SpO2:  98% 99% 100%  Weight: 70.1 kg (154 lb 8.7 oz)  74.1 kg (163 lb 5.8 oz)   Height: 5\' 2"  (1.575 m)  5\' 2"  (1.575 m)     Intake/Output Summary (Last 24 hours) at 08/16/2017 1536 Last data filed at 08/16/2017 1200 Gross per 24 hour  Intake 1120 ml  Output -  Net 1120 ml   Filed Weights   08/16/17 0100 08/16/17 0408  Weight: 70.1 kg (154 lb 8.7 oz) 74.1 kg (163 lb 5.8 oz)    Examination:  General exam: Appears calm and comfortable.  Respiratory system: Clear to auscultation. Respiratory effort normal. Cardiovascular system: S1 & S2 heard Gastrointestinal system: Abdomen is obese, soft with epigastric discomfort.  Organs are not palpable.   Central nervous system: Alert and oriented. No focal neurological deficits. Extremities: No leg edema.  Data Reviewed: I have personally reviewed following labs and imaging studies  CBC: Recent Labs  Lab 08/15/17 1259 08/16/17 0214  WBC 13.8* 15.4*  NEUTROABS  --  14.0*  HGB 14.0 12.8  HCT 41.2 38.2  MCV 91.8 92.3  PLT 409* 363   Basic Metabolic Panel: Recent Labs  Lab 08/15/17 1259 08/15/17 2156 08/16/17 0214  NA 136 139 138  K 3.8 4.4 3.4*  CL 105 106 107  CO2 18* 18* 19*  GLUCOSE 318* 266* 228*  BUN 5* 5* <5*  CREATININE 0.63 0.73 0.61  CALCIUM 9.1 9.3 8.7*   GFR: Estimated Creatinine  Clearance: 90.7 mL/min (by C-G formula based on SCr of 0.61 mg/dL). Liver Function Tests: Recent Labs  Lab 08/15/17 1259  AST 23  ALT 39  ALKPHOS 87  BILITOT 0.5  PROT 7.0  ALBUMIN 4.1   Recent Labs  Lab 08/15/17 1259  LIPASE 47   No results for input(s): AMMONIA in the last 168 hours. Coagulation Profile: No results for input(s): INR, PROTIME in the last 168 hours. Cardiac Enzymes: No results for input(s): CKTOTAL, CKMB, CKMBINDEX, TROPONINI in the last 168 hours. BNP (last 3 results) No results for input(s): PROBNP in the last 8760 hours. HbA1C: No results for input(s): HGBA1C in the last 72 hours. CBG: Recent Labs  Lab 08/15/17 2320 08/16/17 0148 08/16/17 0532 08/16/17 0807 08/16/17 1211  GLUCAP 238* 215* 201* 217* 200*   Lipid Profile: No results for input(s): CHOL, HDL, LDLCALC, TRIG, CHOLHDL, LDLDIRECT in the last 72 hours. Thyroid Function Tests: No results for input(s): TSH, T4TOTAL, FREET4, T3FREE, THYROIDAB in the last 72 hours. Anemia Panel: No results for input(s): VITAMINB12, FOLATE, FERRITIN, TIBC, IRON, RETICCTPCT in the last 72 hours. Urine analysis:    Component Value Date/Time   COLORURINE STRAW (A) 08/15/2017 1751   APPEARANCEUR HAZY (A) 08/15/2017 1751   LABSPEC 1.025 08/15/2017 1751   PHURINE 8.0 08/15/2017 1751   GLUCOSEU >=500 (A) 08/15/2017 1751   HGBUR SMALL (A) 08/15/2017 1751   BILIRUBINUR NEGATIVE 08/15/2017 1751   KETONESUR 80 (A) 08/15/2017 1751   PROTEINUR 30 (A) 08/15/2017 1751   UROBILINOGEN 0.2 06/23/2015 0852   NITRITE NEGATIVE 08/15/2017 1751   LEUKOCYTESUR NEGATIVE 08/15/2017 1751   Sepsis Labs: @LABRCNTIP (procalcitonin:4,lacticidven:4)  )No results found for this or any previous visit (from the past 240 hour(s)).       Radiology Studies: No results found.      Scheduled Meds: . enoxaparin (LOVENOX) injection  40 mg Subcutaneous Q24H  . insulin aspart  0-15 Units Subcutaneous Q4H  . insulin detemir  8  Units Subcutaneous BID  . lisinopril  5 mg Oral Daily  . metoCLOPramide (REGLAN) injection  5 mg Intravenous Q6H  . pantoprazole  40 mg Oral BID AC   Continuous Infusions:   LOS: 0 days    Time spent: 25 minutes    Berton Mount, MD  [facility] Pager #: [phone] 7PM-7AM contact night coverage as above

## 2017-08-16 NOTE — H&P (Signed)
History and Physical    Cheryl Bolderamisha Grandfield [id number] DOB: [birth year] DOA: 08/15/2017  PCP: Patient, No Pcp Per   Patient coming from: Home  Chief Complaint: Epigastric discomfort, nausea, vomiting   HPI: Cheryl Parker is a [age] y.o. female with medical history significant for poorly controlled insulin-dependent diabetes mellitus, and hypertension, now presenting to the emergency department for evaluation of abdominal discomfort, nausea, and vomiting.  Patient reports that she been in her usual state until approximately 2 days ago when she developed pain in the epigastrium with nausea and nonbloody vomiting.  Symptoms have persisted, worsening since time of onset.  She denies any associated fevers, diarrhea, or chest pain.  No shortness of breath or cough.  ED Course: Upon arrival to the ED, patient is found to be afebrile, saturating well on room air, tachycardic, and mildly hypertensive.  EKG features a sinus rhythm with LAFB.  Chemistry panel is notable for a bicarbonate of 18 and glucose of 318.  CBC features a leukocytosis to 13,800 and thrombocytosis with platelets 109,000.  Troponin is undetectable.  Patient was given 2 L of normal saline, 5 units IV NovoLog, Reglan, Ativan, Zofran, and Haldol in the ED.  She continues to complain of severe nausea and will be observed for ongoing evaluation and management of this.  Review of Systems:  All other systems reviewed and apart from HPI, are negative.  Past Medical History:  Diagnosis Date  . DKA (diabetic ketoacidoses) (HCC) 09/10/2014; 06/02/2017  . GERD (gastroesophageal reflux disease)   . Gestational diabetes   . Headache   . Hypertension   . Pregnancy induced hypertension   . Type 1 diabetes mellitus (HCC)    Insulin dependent    Past Surgical History:  Procedure Laterality Date  . CESAREAN SECTION  2011  . CESAREAN SECTION N/A 07/05/2012   Procedure: CESAREAN SECTION;  Surgeon: Catalina AntiguaPeggy Constant, MD;  Location: [facility];  Service:  Obstetrics;  Laterality: N/A;  . CESAREAN SECTION N/A 06/25/2015   Procedure: CESAREAN SECTION;  Surgeon: Catalina AntiguaPeggy Constant, MD;  Location: [facility];  Service: Obstetrics;  Laterality: N/A;  . ESOPHAGOGASTRODUODENOSCOPY (EGD) WITH PROPOFOL Left 12/28/2016   Procedure: ESOPHAGOGASTRODUODENOSCOPY (EGD) WITH PROPOFOL;  Surgeon: Kerin SalenKarki, Arya, MD;  [facility];  Service: Gastroenterology;  Laterality: Left;     reports that she has quit smoking. Her smoking use included cigarettes. She has a 1.70 pack-year smoking history. She has never used smokeless tobacco. She reports that she does not drink alcohol or use drugs.  No Known Allergies  Family History  Problem Relation Age of Onset  . Diabetes Mother   . Kidney disease Father   . Birth defects Son        unilateral renal agenesis     Prior to Admission medications   Medication Sig Start Date End Date Taking? Authorizing Provider  acetaminophen (TYLENOL) 500 MG tablet Take 1,000 mg by mouth every 6 (six) hours as needed for mild pain.   Yes [provider]  insulin NPH-regular Human (NOVOLIN 70/30) (70-30) 100 UNIT/ML injection Inject 13 Units into skin two times daily with a meal Patient taking differently: Inject 13 Units into the skin 3 (three) times daily. Inject 13 Units into skin two times daily with a meal 06/06/17  Yes Briant CedarEzenduka, Nkeiruka J, MD  omeprazole (PRILOSEC) 20 MG capsule Take 1 capsule (20 mg total) by mouth 2 (two) times daily before a meal. 06/06/17 08/15/17 Yes Briant CedarEzenduka, Nkeiruka J, MD  lisinopril (PRINIVIL,ZESTRIL) 5 MG tablet Take 1  tablet (5 mg total) by mouth daily. Patient not taking: Reported on 06/02/2017 12/29/16   Tyrone Nine, MD    Physical Exam: Vitals:   08/15/17 1403 08/15/17 1434 08/15/17 1727 08/15/17 2019  BP: 93/69 (!) 151/76 (!) 151/101 128/84  Pulse: (!) 105 91 (!) 122 67  Resp: (!) 24  16 18   Temp: 98.4 F (36.9 C)  99.3 F (37.4 C) 98.2 F (36.8 C)  TempSrc: Oral  Oral Oral  SpO2: 100%  98% 100% 100%      Constitutional: NAD, calm, uncomfortable  Eyes: PERTLA, lids and conjunctivae normal ENMT: Mucous membranes are moist. Posterior pharynx clear of any exudate or lesions.   Neck: normal, supple, no masses, no thyromegaly Respiratory: clear to auscultation bilaterally, no wheezing, no crackles. Normal respiratory effort.   Cardiovascular: S1 & S2 heard, regular rate and rhythm. No extremity edema.  Abdomen: No distension, mild epigastric tenderness without rebound pain or guardoing, soft. Bowel sounds active.  Musculoskeletal: no clubbing / cyanosis. No joint deformity upper and lower extremities.   Skin: no significant rashes, lesions, ulcers. Poor turgor. Neurologic: CN 2-12 grossly intact. Sensation intact. Strength 5/5 in all 4 limbs.  Psychiatric: Alert and oriented x 3. Calm, cooperative.     Labs on Admission: I have personally reviewed following labs and imaging studies  CBC: Recent Labs  Lab 08/15/17 1259  WBC 13.8*  HGB 14.0  HCT 41.2  MCV 91.8  PLT 409*   Basic Metabolic Panel: Recent Labs  Lab 08/15/17 1259 08/15/17 2156  NA 136 139  K 3.8 4.4  CL 105 106  CO2 18* 18*  GLUCOSE 318* 266*  BUN 5* 5*  CREATININE 0.63 0.73  CALCIUM 9.1 9.3   GFR: CrCl cannot be calculated (Unknown ideal weight.). Liver Function Tests: Recent Labs  Lab 08/15/17 1259  AST 23  ALT 39  ALKPHOS 87  BILITOT 0.5  PROT 7.0  ALBUMIN 4.1   Recent Labs  Lab 08/15/17 1259  LIPASE 47   No results for input(s): AMMONIA in the last 168 hours. Coagulation Profile: No results for input(s): INR, PROTIME in the last 168 hours. Cardiac Enzymes: No results for input(s): CKTOTAL, CKMB, CKMBINDEX, TROPONINI in the last 168 hours. BNP (last 3 results) No results for input(s): PROBNP in the last 8760 hours. HbA1C: No results for input(s): HGBA1C in the last 72 hours. CBG: Recent Labs  Lab 08/15/17 1431 08/15/17 2202 08/15/17 2320  GLUCAP 263* 255* 238*    Lipid Profile: No results for input(s): CHOL, HDL, LDLCALC, TRIG, CHOLHDL, LDLDIRECT in the last 72 hours. Thyroid Function Tests: No results for input(s): TSH, T4TOTAL, FREET4, T3FREE, THYROIDAB in the last 72 hours. Anemia Panel: No results for input(s): VITAMINB12, FOLATE, FERRITIN, TIBC, IRON, RETICCTPCT in the last 72 hours. Urine analysis:    Component Value Date/Time   COLORURINE STRAW (A) 08/15/2017 1751   APPEARANCEUR HAZY (A) 08/15/2017 1751   LABSPEC 1.025 08/15/2017 1751   PHURINE 8.0 08/15/2017 1751   GLUCOSEU >=500 (A) 08/15/2017 1751   HGBUR SMALL (A) 08/15/2017 1751   BILIRUBINUR NEGATIVE 08/15/2017 1751   KETONESUR 80 (A) 08/15/2017 1751   PROTEINUR 30 (A) 08/15/2017 1751   UROBILINOGEN 0.2 06/23/2015 0852   NITRITE NEGATIVE 08/15/2017 1751   LEUKOCYTESUR NEGATIVE 08/15/2017 1751   Sepsis Labs: @LABRCNTIP (procalcitonin:4,lacticidven:4) )No results found for this or any previous visit (from the past 240 hour(s)).   Radiological Exams on Admission: No results found.  EKG: Independently reviewed. Sinus rhythm, incomplete  RBBB, LAFB.   Assessment/Plan   1. Intractable nausea and vomiting  - Presents with 2 days of general abd discomfort, nausea, and non-bloody vomiting  - ED workup notable for hyperglycmia and she appears hypovolemic  - Abdominal exam is benign  - Treated in ED with IVF, Zofran, Reglan, Ativan, and Haldol but continues to complain of severe nausea  - Exam benign, possibly secondary to gastroparesis, marijuana  - Schedule Reglan, continue IVF hydration, prn antiemetics, monitor lytes   2. Insulin-dependent DM, hyperglycemia  - A1c was 9.3% in February  - Managed at home with Novolin 70/30 13 units TID  - Follow CBG's and use Levemir 8 units BID plus SSI to start while in hospital   3. Hypertension   - BP elevated in ED in setting of N/V  - Continue lisinopril, use hydralazine IVP's prn     DVT prophylaxis: Lovenox Code Status: Full   Family Communication: Discussed with patient Consults called: None Admission status: Observation     Briscoe Deutscher, MD [facility] Pager [phone]  If 7PM-7AM, please contact night-coverage [email or web address] Password [email or web address]  08/16/2017, 1:44 AM

## 2017-08-16 NOTE — Progress Notes (Signed)
Nutrition Brief Note  Patient identified on the Malnutrition Screening Tool (MST) Report  Wt Readings from Last 15 Encounters:  08/16/17 163 lb 5.8 oz (74.1 kg)  06/06/17 154 lb 9.6 oz (70.1 kg)  12/28/16 141 lb (64 kg)  12/23/16 142 lb (64.4 kg)  08/04/15 159 lb 1.6 oz (72.2 kg)  07/10/15 161 lb (73 kg)  06/23/15 185 lb 4 oz (84 kg)  06/23/15 183 lb 14.4 oz (83.4 kg)  06/19/15 186 lb (84.4 kg)  06/16/15 184 lb 3.2 oz (83.6 kg)  06/09/15 185 lb 6.4 oz (84.1 kg)  06/02/15 185 lb 1.6 oz (84 kg)  05/26/15 186 lb 12.8 oz (84.7 kg)  05/21/15 182 lb 12.8 oz (82.9 kg)  05/19/15 186 lb 1.6 oz (84.4 kg)   Cheryl Parker is a 37 y.o. female with medical history significant for poorly controlled insulin-dependent diabetes mellitus, and hypertension, now presenting to the emergency department for evaluation of abdominal discomfort, nausea, and vomiting.  Pt admitted with epigastric pain and intractable nausea and vomiting.   Pt very somnolent at time of visit and did not arouse when name was called or during exam.   Spoke with pt's grandmother at bedside, who was able to provide minimal history. She reports pt typically has a very good appetite and is very active, caring for her 3 young sons at home. Per pt grandmother, pt was in her usual state of health up until 3 days ago, when she mentioned that she was not feeling well.  Reviewed wt hx; noted no wt loss over the past year. However, noted mild wt loss in the past.   Noted pt consumed about 50% of meal tray (orange juice and ice cream), from breakfast tray.   Nutrition-Focused physical exam completed. Findings are no fat depletion, no muscle depletion, and no edema.   Last Hgb A1c: 9.3 (06/02/17). PTA DM medications 13 units insulin NPH regular 70/30 BID. Of note, pt does not have insurance.   Labs reviewed: K: 3.4, CBGS: 201-217 (inpatient orders for glycemic ontrol are 0-15 units insulin aspart every 4 hours and 8 units insulin detemir  BID).   Body mass index is 29.88 kg/m. Patient meets criteria for overweight based on current BMI.   Current diet order is Heart Healthy, patient is consuming approximately 50% of meals at this time. Labs and medications reviewed.   No nutrition interventions warranted at this time. If nutrition issues arise, please consult RD.   Cheryl Wingert A. Mayford KnifeWilliams, RD, LDN, CDE Pager: [phone] After hours Pager: [phone]

## 2017-08-16 NOTE — Hospital Discharge Follow-Up (Signed)
Transitional Care Clinic Care Coordination Note:  Admit date:  08/15/2017 Discharge date: TBD Discharge Disposition: home   This Case Manager reviewed patient's EMR and determined patient would benefit from post-discharge medical management and chronic care management services through the [facility]. Patient has a history of Dm - type 1, HTN, GERD.  She has 2 hospital admissions since 06/01/17 and 5 hospital admissions and 1 Ed visit in the past year. She is currently admitted with intractable vomiting and hyperglycemia. This Case Manager met with patient to discuss the services and medical management that can be provided at the [facility]. Patient verbalized understanding and agreed to receive post-discharge care at the [facility]. She had been referred to the [facility] in the past but was not able to keep her appointments. She was not feeling well when this CM saw her this afternoon but she said she would like to schedule an appointment at [facility].  She noted that she would have transportation.   Patient scheduled for Transitional Care appointment on 08/22/17 @ 1410 .  Clinic information and appointment time provided to patient. Appointment information also placed on AVS.  Voicemail message left for Elenor Quinones, RN CM noting that the patient will be following up at [facility].  Message then received from Ricki Traum, RN CM noting that she is following this patient.

## 2017-08-16 NOTE — Progress Notes (Signed)
Pt admitted from Ed for nausea and vomiting. Alert but very drowsy. Unable to conduct admission assessment at this time

## 2017-08-17 ENCOUNTER — Inpatient Hospital Stay (HOSPITAL_COMMUNITY): Payer: Self-pay

## 2017-08-17 DIAGNOSIS — R112 Nausea with vomiting, unspecified: Secondary | ICD-10-CM

## 2017-08-17 LAB — CBC WITH DIFFERENTIAL/PLATELET
Basophils Absolute: 0 10*3/uL (ref 0.0–0.1)
Basophils Relative: 0 %
Eosinophils Absolute: 0 10*3/uL (ref 0.0–0.7)
Eosinophils Relative: 0 %
HCT: 40.2 % (ref 36.0–46.0)
Hemoglobin: 13.3 g/dL (ref 12.0–15.0)
Lymphocytes Relative: 14 %
Lymphs Abs: 2.1 10*3/uL (ref 0.7–4.0)
MCH: 30.8 pg (ref 26.0–34.0)
MCHC: 33.1 g/dL (ref 30.0–36.0)
MCV: 93.1 fL (ref 78.0–100.0)
Monocytes Absolute: 1 10*3/uL (ref 0.1–1.0)
Monocytes Relative: 6 %
Neutro Abs: 12.5 10*3/uL — ABNORMAL HIGH (ref 1.7–7.7)
Neutrophils Relative %: 80 %
Platelets: 379 10*3/uL (ref 150–400)
RBC: 4.32 MIL/uL (ref 3.87–5.11)
RDW: 12.5 % (ref 11.5–15.5)
WBC: 15.6 10*3/uL — ABNORMAL HIGH (ref 4.0–10.5)

## 2017-08-17 LAB — RENAL FUNCTION PANEL
Albumin: 3.6 g/dL (ref 3.5–5.0)
Anion gap: 11 (ref 5–15)
BUN: 6 mg/dL (ref 6–20)
CO2: 20 mmol/L — ABNORMAL LOW (ref 22–32)
Calcium: 8.9 mg/dL (ref 8.9–10.3)
Chloride: 104 mmol/L (ref 101–111)
Creatinine, Ser: 0.62 mg/dL (ref 0.44–1.00)
GFR calc Af Amer: >60 mL/min (ref >60)
GFR calc non Af Amer: >60 mL/min (ref >60)
Glucose, Bld: 191 mg/dL — ABNORMAL HIGH (ref 65–99)
Phosphorus: 2.5 mg/dL (ref 2.5–4.6)
Potassium: 3.8 mmol/L (ref 3.5–5.1)
Sodium: 135 mmol/L (ref 135–145)

## 2017-08-17 LAB — GLUCOSE, CAPILLARY
GLUCOSE-CAPILLARY: 211 mg/dL — AB (ref 65–99)
GLUCOSE-CAPILLARY: 200 mg/dL — AB (ref 65–99)
GLUCOSE-CAPILLARY: 172 mg/dL — AB (ref 65–99)
Glucose-Capillary: 162 mg/dL — ABNORMAL HIGH (ref 65–99)
Glucose-Capillary: 197 mg/dL — ABNORMAL HIGH (ref 65–99)

## 2017-08-17 LAB — MAGNESIUM: Magnesium: 1.8 mg/dL (ref 1.7–2.4)

## 2017-08-17 MED ORDER — METOCLOPRAMIDE HCL 5 MG PO TABS: 5.0000 mg | ORAL_TABLET | Freq: Three times a day (TID) | ORAL | 0 refills | Status: DC | PRN

## 2017-08-17 MED ORDER — INSULIN DETEMIR 100 UNIT/ML ~~LOC~~ SOLN
8.0000 [IU] | Freq: Two times a day (BID) | SUBCUTANEOUS | Status: DC
  Administered 2017-08-18: 8 [IU] via SUBCUTANEOUS
  Filled 2017-08-17: qty 0.08

## 2017-08-17 MED ORDER — LISINOPRIL 5 MG PO TABS: 5.0000 mg | ORAL_TABLET | Freq: Every day | ORAL | 0 refills | Status: DC

## 2017-08-17 MED ORDER — DICLOFENAC SODIUM 1 % TD GEL
2.0000 g | Freq: Four times a day (QID) | TRANSDERMAL | Status: DC | PRN
  Filled 2017-08-17: qty 100

## 2017-08-17 MED ORDER — METOCLOPRAMIDE HCL 5 MG PO TABS
5.0000 mg | ORAL_TABLET | Freq: Three times a day (TID) | ORAL | Status: DC
  Administered 2017-08-17 – 2017-08-18 (×2): 5 mg via ORAL
  Filled 2017-08-17 (×2): qty 1

## 2017-08-17 MED ORDER — METOCLOPRAMIDE HCL 5 MG PO TABS
5.0000 mg | ORAL_TABLET | Freq: Three times a day (TID) | ORAL | Status: DC
  Administered 2017-08-17 (×2): 5 mg via ORAL
  Filled 2017-08-17 (×2): qty 1

## 2017-08-17 MED ORDER — BISACODYL 10 MG RE SUPP
10.0000 mg | Freq: Once | RECTAL | Status: AC
  Administered 2017-08-17: 10 mg via RECTAL
  Filled 2017-08-17: qty 1

## 2017-08-17 MED ORDER — OMEPRAZOLE 20 MG PO CPDR: 20.0000 mg | DELAYED_RELEASE_CAPSULE | Freq: Every day | ORAL | 0 refills | Status: DC

## 2017-08-17 MED ORDER — POLYETHYLENE GLYCOL 3350 17 G PO PACK: 17.0000 g | PACK | Freq: Every day | ORAL | 0 refills | Status: DC

## 2017-08-17 MED ORDER — PROMETHAZINE HCL 25 MG PO TABS
12.5000 mg | ORAL_TABLET | Freq: Four times a day (QID) | ORAL | Status: DC | PRN
  Administered 2017-08-17 – 2017-08-18 (×3): 12.5 mg via ORAL
  Filled 2017-08-17 (×3): qty 1

## 2017-08-17 MED ORDER — INSULIN ASPART 100 UNIT/ML ~~LOC~~ SOLN
0.0000 [IU] | SUBCUTANEOUS | Status: DC
  Administered 2017-08-17 – 2017-08-18 (×4): 3 [IU] via SUBCUTANEOUS

## 2017-08-17 MED ORDER — LIDOCAINE 5 % EX PTCH
1.0000 | MEDICATED_PATCH | CUTANEOUS | Status: DC
  Administered 2017-08-17 – 2017-08-18 (×2): 1 via TRANSDERMAL
  Filled 2017-08-17 (×2): qty 1

## 2017-08-17 MED ORDER — CYCLOBENZAPRINE HCL 5 MG PO TABS: 5.0000 mg | ORAL_TABLET | Freq: Three times a day (TID) | ORAL | Status: DC | PRN

## 2017-08-17 MED ORDER — INSULIN ASPART 100 UNIT/ML ~~LOC~~ SOLN: 0.0000 [IU] | Freq: Three times a day (TID) | SUBCUTANEOUS | Status: DC

## 2017-08-17 MED ORDER — INSULIN ASPART 100 UNIT/ML ~~LOC~~ SOLN: 0.0000 [IU] | Freq: Every day | SUBCUTANEOUS | Status: DC

## 2017-08-17 MED ORDER — POLYETHYLENE GLYCOL 3350 17 G PO PACK
17.0000 g | PACK | Freq: Two times a day (BID) | ORAL | Status: DC
  Administered 2017-08-17 – 2017-08-18 (×3): 17 g via ORAL
  Filled 2017-08-17 (×3): qty 1

## 2017-08-17 MED ORDER — INSULIN ASPART PROT & ASPART (70-30 MIX) 100 UNIT/ML ~~LOC~~ SUSP
13.0000 [IU] | Freq: Once | SUBCUTANEOUS | Status: AC
  Administered 2017-08-17: 13 [IU] via SUBCUTANEOUS
  Filled 2017-08-17: qty 10

## 2017-08-17 NOTE — Progress Notes (Signed)
Discharge instructions given. Pt verbalized understanding and all questions were answered.  

## 2017-08-17 NOTE — Discharge Summary (Addendum)
Physician Discharge Summary  Cheryl Parker ZOX:[id number] DOB: [birth year] DOA: 08/15/2017  PCP: Patient, No Pcp Per  Admit date: 08/15/2017 Discharge date: 08/17/2017  Time spent: 35 minutes  Recommendations for Outpatient Follow-up:  1. Follow up outpatient CBC/CMP 2. Started on reglan for nausea (as this is on 4 dollar list), but needs follow up into nausea and vomiting and alternative long term regimen.  She had gastric emptying study 05/2017 which was normal, so gastroparesis unlikely.  Consider follow up with gastroenterology if persistent. 3. Follow up BG and diabetes regimen. 4. Needs PCP follow up (appointment scheduled) and follow up at [facility] to get chronic prescriptions  Addendum: pt with continued nausea and inability to tolerate PO.  Will hold off on d/c and continue to monitor with supportive care for nausea, vomiting.Marland Kitchen   Discharge Diagnoses:  Principal Problem:   Intractable nausea and vomiting Active Problems:   Type 2 diabetes mellitus with hyperglycemia [facility])   Essential hypertension   Discharge Condition: stable  Filed Weights   08/16/17 0100 08/16/17 0408  Weight: 70.1 kg (154 lb 8.7 oz) 74.1 kg (163 lb 5.8 oz)    History of present illness:  Per HPI [name] is Cheryl Parker 37 y.o. female with medical history significant for poorly controlled insulin-dependent diabetes mellitus, and hypertension, now presenting to the emergency department for evaluation of abdominal discomfort, nausea, and vomiting.  Patient reports that she been in her usual state until approximately 2 days ago when she developed pain in the epigastrium with nausea and nonbloody vomiting.  Symptoms have persisted, worsening since time of onset.  She denies any associated fevers, diarrhea, or chest pain.  No shortness of breath or cough.  She was initially started on scheduled reglan for concern for gastroparesis.  She continued to have nausea during the hospitalization even up to  the day of discharge that waxed and waned (seemed to improve with antiemetics).  On day of discharge, patient was still experiencing nausea, but improved with antiemetics.  PO intake was decreased, but she was able to take small amounts.  Recommended staying overnight given continued nausea and poor PO intake, but patient wanted to get home to children.  Felt like she would be able to manage things at home.  Discussed strict return precautions (for intractable nausea, vomiting, inability to tolerate PO, abdominal pain).  Will prescribe reglan for nausea since this is on 4 dollar list at [facility].  Also, pt instructed to pick up refill of insulin from [facility] tonight and call her PCP to schedule follow up appt tomorrow.    Hospital Course:  1.Intractable nausea and vomiting -Presented with 2 days of general abd discomfort, nausea, and non-bloody vomiting -Exam benign.  Ddx includes cyclic vomiting, cannabinoid hyperemesis.  Gastroparesis is possible, but in setting of normal gastric emptying study in February of this year seems less likely.  Pt is constipated and this could be contributing.  Discussed recommendation to stop MJ as this could be contributing. - pt continued to have some nausea throughout the day which was improved with antiemetics   - started on reglan as needed at d/c for N/V as this is on 4 dollar list (some concern for gastroparesis, but less likely with normal gastric emptying study) - will transition reglan to PO.  EKG this AM with normal QTc.   - Continue PPI at discharge - bowel regimen (pt had BM prior to d/c)  2.Insulin-dependent DM, hyperglycemia -A1c was 9.3% in February -Managed at home with  Novolin 70/30 13 units TID, will resume this at d/c (discussed importance of picking up her insulin from [facility] before she goes home - discussed to watch BG closely as may need lower dose if she's not taking normal PO)  3.Hypertension -Continue lisinopril, prescribed  at d/c. Will need continued monitoring at follow up. - ctm  # Leukocytosis: stable, no other si/sx infection.  Follow as outpatient.  # NAGMA: follow, improved  Procedures:  none   Consultations:  none  Discharge Exam: Vitals:   08/17/17 0454 08/17/17 1442  BP: (!) 143/76 (!) 173/96  Pulse: 93 96  Resp: 18   Temp:  98.7 F (37.1 C)  SpO2: 99% 100%   Wants to go home.  Needs to take care of her children and her children's father is going to work Advertising account executive.  General: No acute distress. Cardiovascular: Heart sounds show Suvi Archuletta regular rate, and rhythm. No gallops or rubs. No murmurs. No JVD. Lungs: Clear to auscultation bilaterally with good air movement. No rales, rhonchi or wheezes. Abdomen: Soft, very mildly ttp in epigastric region, nondistended with normal active bowel sounds. No masses. No hepatosplenomegaly. Neurological: Alert and oriented 3. Moves all extremities 4. Cranial nerves II through XII grossly intact. Skin: Warm and dry. No rashes or lesions. Extremities: No clubbing or cyanosis. No edema. Psychiatric: Mood and affect are normal. Insight and judgment are appropriate.   Discharge Instructions   Discharge Instructions    Call MD for:  difficulty breathing, headache or visual disturbances   Complete by:  As directed    Call MD for:  extreme fatigue   Complete by:  As directed    Call MD for:  persistant dizziness or light-headedness   Complete by:  As directed    Call MD for:  persistant nausea and vomiting   Complete by:  As directed    Call MD for:  severe uncontrolled pain   Complete by:  As directed    Call MD for:  temperature >100.4   Complete by:  As directed    Diet - low sodium heart healthy   Complete by:  As directed    Diet - low sodium heart healthy   Complete by:  As directed    Discharge instructions   Complete by:  As directed    You were seen for nausea and vomiting.    The cause is not totally clear.  It could be related to  marijuana use or diabetes (though the study you got Maham Quintin few months ago suggested against gastroparesis).  Please stop marijuana use.  Please follow up with your PCP to continue to follow up these symptoms.  Please pick up Lorissa Kishbaugh new prescription for your insulin tonight from [facility].  This is extremely important.   I also prescribed reglan, which is on the 4 dollar list at [facility]    You should schedule an appointment as soon as you can at the [facility] and [facility].  You can use this as needed until you get follow up with your PCP.  You can pick up your other prescriptions there.  Return if you have new, recurrent, or worsening symptoms.   Please ask your PCP to request records from this hospitalization so they know what was done and what the next steps will be.   Increase activity slowly   Complete by:  As directed    Increase activity slowly   Complete by:  As directed  Allergies as of 08/17/2017   No Known Allergies     Medication List    TAKE these medications   acetaminophen 500 MG tablet Commonly known as:  TYLENOL Take 1,000 mg by mouth every 6 (six) hours as needed for mild pain.   insulin NPH-regular Human (70-30) 100 UNIT/ML injection Commonly known as:  NOVOLIN 70/30 Inject 13 Units into skin two times daily with Makella Buckingham meal What changed:    how much to take  how to take this  when to take this  additional instructions   lisinopril 5 MG tablet Commonly known as:  PRINIVIL,ZESTRIL Take 1 tablet (5 mg total) by mouth daily.   metoCLOPramide 5 MG tablet Commonly known as:  REGLAN Take 1 tablet (5 mg total) by mouth every 8 (eight) hours as needed for up to 15 doses for nausea or vomiting.   omeprazole 20 MG capsule Commonly known as:  PRILOSEC Take 1 capsule (20 mg total) by mouth daily. What changed:  when to take this   polyethylene glycol packet Commonly known as:  MIRALAX Take 17 g by mouth daily.      No Known Allergies Follow-up Information     [facility]. Go on 08/22/2017.   Why:  at 2:10pm for an appointment with Dr Valarie MerinoNewlin Contact information: [address] [place] [phone]           The results of significant diagnostics from this hospitalization (including imaging, microbiology, ancillary and laboratory) are listed below for reference.    Significant Diagnostic Studies: No results found.  Microbiology: No results found for this or any previous visit (from the past 240 hour(s)).   Labs: Basic Metabolic Panel: Recent Labs  Lab 08/15/17 1259 08/15/17 2156 08/16/17 0214 08/17/17 0521  NA 136 139 138 135  K 3.8 4.4 3.4* 3.8  CL 105 106 107 104  CO2 18* 18* 19* 20*  GLUCOSE 318* 266* 228* 191*  BUN 5* 5* <5* 6  CREATININE 0.63 0.73 0.61 0.62  CALCIUM 9.1 9.3 8.7* 8.9  MG  --   --   --  1.8  PHOS  --   --   --  2.5   Liver Function Tests: Recent Labs  Lab 08/15/17 1259 08/17/17 0521  AST 23  --   ALT 39  --   ALKPHOS 87  --   BILITOT 0.5  --   PROT 7.0  --   ALBUMIN 4.1 3.6   Recent Labs  Lab 08/15/17 1259  LIPASE 47   No results for input(s): AMMONIA in the last 168 hours. CBC: Recent Labs  Lab 08/15/17 1259 08/16/17 0214 08/17/17 0521  WBC 13.8* 15.4* 15.6*  NEUTROABS  --  14.0* 12.5*  HGB 14.0 12.8 13.3  HCT 41.2 38.2 40.2  MCV 91.8 92.3 93.1  PLT 409* 363 379   Cardiac Enzymes: No results for input(s): CKTOTAL, CKMB, CKMBINDEX, TROPONINI in the last 168 hours. BNP: BNP (last 3 results) No results for input(s): BNP in the last 8760 hours.  ProBNP (last 3 results) No results for input(s): PROBNP in the last 8760 hours.  CBG: Recent Labs  Lab 08/16/17 2352 08/17/17 0452 08/17/17 0747 08/17/17 1220 08/17/17 1622  GLUCAP 198* 211* 162* 197* 200*       Signed:  Lacretia Nicksaldwell Powell MD.  [facility] 08/17/2017, 7:14 PM

## 2017-08-17 NOTE — Care Management Note (Signed)
Case Management Note  Patient Details  Name: Cheryl Parker MRN: [id number] Date of Birth: [birth year]  Subjective/Objective:    Pt admitte dwith abdominal discomfort, n/v.                 Action/Plan:  PTA independent from home with family.  Pt does not have PCP nor prescription insurance.  TCC with [facility] following - pt has missed multiple appts with clinic however a new appt was secured for 4/29.  Pt was MATCHED in Sept of 2018 - CM explained to pt and attending that pt will not be able to utilize this program until September 2019.  Pt informed CM that she usually gets [facility].  Attending informed CM that he will ensure all discharge medications will be the lowest cost possible.   Expected Discharge Date:                  Expected Discharge Plan:  Home/Self Care  In-House Referral:     Discharge planning Services  CM Consult  Post Acute Care Choice:    Choice offered to:     DME Arranged:    DME Agency:     HH Arranged:    HH Agency:     Status of Service:     If discussed at [employer] Length of Stay Meetings, dates discussed:    Additional Comments:  Cheryl Parker, Cheryl Florido S, RN 08/17/2017, 3:53 PM

## 2017-08-17 NOTE — Plan of Care (Signed)
  Problem: Clinical Measurements: Goal: Ability to maintain clinical measurements within normal limits will improve Outcome: Progressing   Problem: Education: Goal: Knowledge of General Education information will improve Outcome: Progressing   

## 2017-08-17 NOTE — Progress Notes (Signed)
Discharge home cancelled by MD,pt to stay one more night

## 2017-08-17 NOTE — Progress Notes (Addendum)
PROGRESS NOTE    Cheryl Parker  [id number] DOB: 08-21-1980 DOA: 08/15/2017 PCP: Patient, No Pcp Per   Brief Narrative:  Patient is Cheryl Parker 37 y.o.femalewith past medical history significant forpoorly controlled insulin-dependent diabetes mellitus, and hypertension.  Patient is admitted with Cheryl Parker 2 day history of abdominal discomfort, nausea, and vomiting.   Assessment & Plan:   Principal Problem:   Intractable nausea and vomiting Active Problems:   Type 2 diabetes mellitus with hyperglycemia (HCC)   Essential hypertension   1. Intractable nausea and vomiting  - Presents with 2 days of general abd discomfort, nausea, and non-bloody vomiting  - Exam benign.  Ddx includes cyclic vomiting, cannabinoid hyperemesis.  Gastroparesis is possible, but in setting of normal gastric emptying study in February of this year seems less likely.  Pt is constipated and this could be contributing.  Discussed recommendation to stop MJ as this could be contributing. - Treated in ED with IVF, Zofran, Reglan, Ativan, and Haldol but continues to complain of severe nausea  - pt c/o some nausea this morning - will transition reglan to PO.  EKG this AM with normal QTc.   - Continue PPI, zofran - bowel regimen, suppository  2. Insulin-dependent DM, hyperglycemia  - A1c was 9.3% in February  - Managed at home with Novolin 70/30 13 units TID  - Follow CBG's and use Levemir 8 units BID plus SSI to start while in hospital   3. Hypertension   - BP elevated, continue to monitor, may need uptitration  - Continue lisinopril - ctm  # Leukocytosis: stable, no other si/sx infection.  Follow.  # NAGMA: follow, improved  She notes difficulty following up and with medications.  Will place care management consult.   DVT prophylaxis: lovenox Code Status: full  Family Communication: grandmother at bedside Disposition Plan: pending improvement, possibly today if able to tolerate PO this afternoon, otherwise  hopefully tomorrow   Consultants:   none  Procedures:   none  Antimicrobials:  Anti-infectives (From admission, onward)   None      Subjective: 3 days of symptoms. Seems worse in AM. Denies desire to take hot showers. Does smoke MJ.  Last about 2 weeks ago.   Objective: Vitals:   08/16/17 1211 08/16/17 2120 08/17/17 0454 08/17/17 1442  BP: (!) 157/77 (!) 156/79 (!) 143/76 (!) 173/96  Pulse: 90 87 93 96  Resp: 16 16 18    Temp: 99.2 F (37.3 C) 98.4 F (36.9 C)  98.7 F (37.1 C)  TempSrc: Oral Oral  Oral  SpO2: 100% 99% 99% 100%  Weight:      Height:        Intake/Output Summary (Last 24 hours) at 08/17/2017 1612 Last data filed at 08/17/2017 1100 Gross per 24 hour  Intake 1000 ml  Output -  Net 1000 ml   Filed Weights   08/16/17 0100 08/16/17 0408  Weight: 70.1 kg (154 lb 8.7 oz) 74.1 kg (163 lb 5.8 oz)    Examination:  General exam: Appears calm and comfortable  Respiratory system: Clear to auscultation. Respiratory effort normal. Cardiovascular system: S1 & S2 heard, RRR. No JVD, murmurs, rubs, gallops or clicks. No pedal edema. Gastrointestinal system: Abdomen is nondistended, soft and nontender. No organomegaly or masses felt. Normal bowel sounds heard. Central nervous system: Alert and oriented. No focal neurological deficits. Extremities: Symmetric 5 x 5 power. Skin: No rashes, lesions or ulcers Psychiatry: Judgement and insight appear normal. Mood & affect appropriate.     Data Reviewed:  I have personally reviewed following labs and imaging studies  CBC: Recent Labs  Lab 08/15/17 1259 08/16/17 0214 08/17/17 0521  WBC 13.8* 15.4* 15.6*  NEUTROABS  --  14.0* 12.5*  HGB 14.0 12.8 13.3  HCT 41.2 38.2 40.2  MCV 91.8 92.3 93.1  PLT 409* 363 379   Basic Metabolic Panel: Recent Labs  Lab 08/15/17 1259 08/15/17 2156 08/16/17 0214 08/17/17 0521  NA 136 139 138 135  K 3.8 4.4 3.4* 3.8  CL 105 106 107 104  CO2 18* 18* 19* 20*  GLUCOSE  318* 266* 228* 191*  BUN 5* 5* <5* 6  CREATININE 0.63 0.73 0.61 0.62  CALCIUM 9.1 9.3 8.7* 8.9  MG  --   --   --  1.8  PHOS  --   --   --  2.5   GFR: Estimated Creatinine Clearance: 90.7 mL/min (by C-G formula based on SCr of 0.62 mg/dL). Liver Function Tests: Recent Labs  Lab 08/15/17 1259 08/17/17 0521  AST 23  --   ALT 39  --   ALKPHOS 87  --   BILITOT 0.5  --   PROT 7.0  --   ALBUMIN 4.1 3.6   Recent Labs  Lab 08/15/17 1259  LIPASE 47   No results for input(s): AMMONIA in the last 168 hours. Coagulation Profile: No results for input(s): INR, PROTIME in the last 168 hours. Cardiac Enzymes: No results for input(s): CKTOTAL, CKMB, CKMBINDEX, TROPONINI in the last 168 hours. BNP (last 3 results) No results for input(s): PROBNP in the last 8760 hours. HbA1C: No results for input(s): HGBA1C in the last 72 hours. CBG: Recent Labs  Lab 08/16/17 1955 08/16/17 2352 08/17/17 0452 08/17/17 0747 08/17/17 1220  GLUCAP 147* 198* 211* 162* 197*   Lipid Profile: No results for input(s): CHOL, HDL, LDLCALC, TRIG, CHOLHDL, LDLDIRECT in the last 72 hours. Thyroid Function Tests: No results for input(s): TSH, T4TOTAL, FREET4, T3FREE, THYROIDAB in the last 72 hours. Anemia Panel: No results for input(s): VITAMINB12, FOLATE, FERRITIN, TIBC, IRON, RETICCTPCT in the last 72 hours. Sepsis Labs: No results for input(s): PROCALCITON, LATICACIDVEN in the last 168 hours.  No results found for this or any previous visit (from the past 240 hour(s)).       Radiology Studies: No results found.      Scheduled Meds: . enoxaparin (LOVENOX) injection  40 mg Subcutaneous Q24H  . insulin aspart  0-15 Units Subcutaneous Q4H  . insulin detemir  8 Units Subcutaneous BID  . lisinopril  5 mg Oral Daily  . metoCLOPramide  5 mg Oral TID AC & HS  . pantoprazole  40 mg Oral BID AC  . polyethylene glycol  17 g Oral BID  . potassium chloride  40 mEq Oral Once   Continuous Infusions:    LOS: 0 days    Time spent: over 30 min  Addendum:  In addition to the time for usual services spent an additional 35 minutes was spent with the patient discussing my recommendation to stay vs possible discharge and follow up options and recommendations for management of symptoms.  Planned on tentative d/c, but given persistent symptoms, recommended pt stay and she was agreeable to this.  Time spent from approximately 630 PM to 705 PM.  Lacretia Nicks, MD [facility] Pager [phone]  If 7PM-7AM, please contact night-coverage [email or web address] Password [facility] 08/17/2017, 4:12 PM

## 2017-08-18 LAB — COMPREHENSIVE METABOLIC PANEL
ALK PHOS: 83 U/L (ref 38–126)
ALT: 20 U/L (ref 14–54)
AST: 13 U/L — ABNORMAL LOW (ref 15–41)
Albumin: 3.9 g/dL (ref 3.5–5.0)
Anion gap: 12 (ref 5–15)
BUN: 5 mg/dL — ABNORMAL LOW (ref 6–20)
CALCIUM: 9 mg/dL (ref 8.9–10.3)
CO2: 22 mmol/L (ref 22–32)
CREATININE: 0.6 mg/dL (ref 0.44–1.00)
Chloride: 99 mmol/L — ABNORMAL LOW (ref 101–111)
Glucose, Bld: 155 mg/dL — ABNORMAL HIGH (ref 65–99)
Potassium: 3.3 mmol/L — ABNORMAL LOW (ref 3.5–5.1)
Sodium: 133 mmol/L — ABNORMAL LOW (ref 135–145)
Total Bilirubin: 1.3 mg/dL — ABNORMAL HIGH (ref 0.3–1.2)
Total Protein: 7 g/dL (ref 6.5–8.1)

## 2017-08-18 LAB — MAGNESIUM: Magnesium: 1.9 mg/dL (ref 1.7–2.4)

## 2017-08-18 LAB — GLUCOSE, CAPILLARY
GLUCOSE-CAPILLARY: 159 mg/dL — AB (ref 65–99)
Glucose-Capillary: 144 mg/dL — ABNORMAL HIGH (ref 65–99)
Glucose-Capillary: 175 mg/dL — ABNORMAL HIGH (ref 65–99)

## 2017-08-18 MED ORDER — PROMETHAZINE HCL 12.5 MG PO TABS: 12.5000 mg | ORAL_TABLET | Freq: Four times a day (QID) | ORAL | 0 refills | Status: DC | PRN

## 2017-08-18 MED ORDER — POTASSIUM CHLORIDE CRYS ER 20 MEQ PO TBCR
40.0000 meq | EXTENDED_RELEASE_TABLET | Freq: Once | ORAL | Status: AC
  Administered 2017-08-18: 40 meq via ORAL
  Filled 2017-08-18: qty 2

## 2017-08-18 NOTE — Progress Notes (Signed)
Patient discharged, AVS and scripts given to the patient.  Patient left with belongings.

## 2017-08-18 NOTE — Plan of Care (Signed)

## 2017-08-18 NOTE — Discharge Summary (Signed)
Physician Discharge Summary  Cheryl Parker ZOX:[id number] DOB: 08-Jun-1980 DOA: 08/15/2017  PCP: Patient, No Pcp Per  Admit date: 08/15/2017 Discharge date: 08/18/2017  Time spent: 35 minutes  Recommendations for Outpatient Follow-up:  1. Follow up outpatient CBC/CMP 2. Initially planned to start on reglan for nausea, but since pt without gastroparesis on emptying study, discharged with phenergan.  Gastroparesis unlikely with normal gastric emptying study 05/2017.  Consider Gi f/u if persistent N/V. 3. Follow up BG and diabetes regiment 4. Needs PCP follow up (appt scheduled) and f/u at [facility] for chronic prescriptions  5. Follow up QTc as outpatient if going to take chronic antiemetics  Discharge Diagnoses:  Principal Problem:   Intractable nausea and vomiting Active Problems:   Type 2 diabetes mellitus with hyperglycemia (HCC)   Essential hypertension   Discharge Condition: stable  Filed Weights   08/16/17 0100 08/16/17 0408  Weight: 70.1 kg (154 lb 8.7 oz) 74.1 kg (163 lb 5.8 oz)    History of present illness:  Per HPI Cheryl Parker a 36 y.o.femalewith medical history significant forpoorly controlled insulin-dependent diabetes mellitus, and hypertension, now presenting to the emergency department for evaluation of abdominal discomfort, nausea, and vomiting. Patient reports that she been in her usual state until approximately 2 days ago when she developed pain in the epigastrium with nausea and nonbloody vomiting. Symptoms have persisted, worsening since time of onset. She denies any associated fevers, diarrhea, or chest pain. No shortness of breath or cough.  She was initially started on scheduled reglan for concern for gastroparesis.  She continued to have nausea during the hospitalization even up to the day of discharge that waxed and waned (seemed to improve with antiemetics).  Initially considered d/c on 4/24, but pt with poor PO and persistent N/V and stayed until  4/25.  She was improved on 4/25 with nausea that improved with antiemetics.  Planned for reglan at discharge as this was on 4 dollar list, but saw that phenergan is also available for reasonable price so sent with this instead with no evidence of gastroparesis on previous studies.  Instructed to pick up new prescriptions today and follow up with PCP as scheduled.   Hospital Course:  1.Intractable nausea and vomiting -Presented with 2 days of general abd discomfort, nausea, and non-bloody vomiting -Exam benign. Ddx includes cyclic vomiting, cannabinoid hyperemesis. Gastroparesis is possible, but in setting of normal gastric emptying study in February of this year seems less likely. Pt is constipated and this could be contributing. Discussed recommendation to stop MJ as this could be contributing. - pt continued to have some nausea throughout the day which was improved with antiemetics   - started on phenergan at discharge - will transition reglan to PO. Borderline QTc today (469). - Continue PPI at discharge - bowel regimen (pt had BM prior to d/c)  2.Insulin-dependent DM, hyperglycemia -A1c was 9.3% in February -Managed at home with Novolin 70/30 13 units TID, will resume this at d/c (discussed importance of picking up her insulin from [facility] before she goes home - discussed to watch BG closely as may need lower dose if she's not taking normal PO)  3.Hypertension -Continue lisinopril, prescribed at d/c. Will need continued monitoring at follow up. - ctm  # Leukocytosis: stable, no other si/sx infection. Follow as outpatient.  # NAGMA:follow, improved  Procedures:  none (i.e. Studies not automatically included, echos, thoracentesis, etc; not x-rays)  Consultations:  none  Discharge Exam: Vitals:   08/17/17 2054 08/18/17 0411  BP: Marland Kitchen)  165/98 (!) 141/86  Pulse: 99 92  Resp: 20 17  Temp: 98.5 F (36.9 C) 97.6 F (36.4 C)  SpO2: 100% 98%   Doing  better.  Feels confident going home.   General: No acute distress. Cardiovascular: Heart sounds show a regular rate, and rhythm. No gallops or rubs. No murmurs. No JVD. Lungs: Clear to auscultation bilaterally with good air movement. No rales, rhonchi or wheezes. Abdomen: Soft, nontender, nondistended with normal active bowel sounds. No masses. No hepatosplenomegaly. Neurological: Alert and oriented 3. Moves all extremities 4 with equal strength. Cranial nerves II through XII grossly intact. Skin: Warm and dry. No rashes or lesions. Extremities: No clubbing or cyanosis. No edema. Pedal pulses 2+. Psychiatric: Mood and affect are normal. Insight and judgment are appropriate.  Discharge Instructions   Discharge Instructions    Call MD for:  difficulty breathing, headache or visual disturbances   Complete by:  As directed    Call MD for:  extreme fatigue   Complete by:  As directed    Call MD for:  persistant dizziness or light-headedness   Complete by:  As directed    Call MD for:  persistant nausea and vomiting   Complete by:  As directed    Call MD for:  severe uncontrolled pain   Complete by:  As directed    Call MD for:  temperature >100.4   Complete by:  As directed    Diet - low sodium heart healthy   Complete by:  As directed    Diet - low sodium heart healthy   Complete by:  As directed    Diet - low sodium heart healthy   Complete by:  As directed    Discharge instructions   Complete by:  As directed    You were seen for nausea and vomiting.    The cause is not totally clear.  It could be related to marijuana use or diabetes (though the study you got a few months ago suggested against gastroparesis).  Please stop marijuana use.  Please follow up with your PCP to continue to follow up these symptoms.  [facility]  This is extremely important.   I prescribed phenergan for your nausea.  [facility]     You should [facility] an [facility].  You can use this as needed until you get follow up with your PCP.  You can pick up your other prescriptions there.  Return if you have new, recurrent, or worsening symptoms.   Please ask your PCP to request records from this hospitalization so they know what was done and what the next steps will be.   Increase activity slowly   Complete by:  As directed    Increase activity slowly   Complete by:  As directed    Increase activity slowly   Complete by:  As directed      Allergies as of 08/18/2017   No Known Allergies     Medication List    TAKE these medications   acetaminophen 500 MG tablet Commonly known as:  TYLENOL Take 1,000 mg by mouth every 6 (six) hours as needed for mild pain.   insulin NPH-regular Human (70-30) 100 UNIT/ML injection Commonly known as:  NOVOLIN 70/30 Inject 13 Units into skin two times daily  with a meal What changed:    how much to take  how to take this  when to take this  additional instructions   lisinopril 5 MG tablet Commonly known as:  PRINIVIL,ZESTRIL Take 1 tablet (5 mg total) by mouth daily.   omeprazole 20 MG capsule Commonly known as:  PRILOSEC Take 1 capsule (20 mg total) by mouth daily. What changed:  when to take this   polyethylene glycol packet Commonly known as:  MIRALAX Take 17 g by mouth daily.   promethazine 12.5 MG tablet Commonly known as:  PHENERGAN Take 1 tablet (12.5 mg total) by mouth every 6 (six) hours as needed for up to 20 doses for nausea or vomiting.      No Known Allergies Follow-up Information    [facility]. Go on 08/22/2017.   Why:  at 2:10pm for an appointment with Dr Valarie Merino information: [address] [place] [address] [phone]           The results of significant diagnostics  from this hospitalization (including imaging, microbiology, ancillary and laboratory) are listed below for reference.    Significant Diagnostic Studies: Dg Chest Port 1 View  Result Date: 08/17/2017 CLINICAL DATA:  Dyspnea EXAM: PORTABLE CHEST 1 VIEW COMPARISON:  06/02/2017 FINDINGS: The heart size and mediastinal contours are within normal limits. Both lungs are clear. The visualized skeletal structures are unremarkable. IMPRESSION: No active disease. Electronically Signed   By: Tollie Eth M.D.   On: 08/17/2017 20:28    Microbiology: No results found for this or any previous visit (from the past 240 hour(s)).   Labs: Basic Metabolic Panel: Recent Labs  Lab 08/15/17 1259 08/15/17 2156 08/16/17 0214 08/17/17 0521 08/18/17 0730  NA 136 139 138 135 133*  K 3.8 4.4 3.4* 3.8 3.3*  CL 105 106 107 104 99*  CO2 18* 18* 19* 20* 22  GLUCOSE 318* 266* 228* 191* 155*  BUN 5* 5* <5* 6 5*  CREATININE 0.63 0.73 0.61 0.62 0.60  CALCIUM 9.1 9.3 8.7* 8.9 9.0  MG  --   --   --  1.8 1.9  PHOS  --   --   --  2.5  --    Liver Function Tests: Recent Labs  Lab 08/15/17 1259 08/17/17 0521 08/18/17 0730  AST 23  --  13*  ALT 39  --  20  ALKPHOS 87  --  83  BILITOT 0.5  --  1.3*  PROT 7.0  --  7.0  ALBUMIN 4.1 3.6 3.9   Recent Labs  Lab 08/15/17 1259  LIPASE 47   No results for input(s): AMMONIA in the last 168 hours. CBC: Recent Labs  Lab 08/15/17 1259 08/16/17 0214 08/17/17 0521  WBC 13.8* 15.4* 15.6*  NEUTROABS  --  14.0* 12.5*  HGB 14.0 12.8 13.3  HCT 41.2 38.2 40.2  MCV 91.8 92.3 93.1  PLT 409* 363 379   Cardiac Enzymes: No results for input(s): CKTOTAL, CKMB, CKMBINDEX, TROPONINI in the last 168 hours. BNP: BNP (last 3 results) No results for input(s): BNP in the last 8760 hours.  ProBNP (last 3 results) No results for input(s): PROBNP in the last 8760 hours.  CBG: Recent Labs  Lab 08/17/17 1622 08/17/17 2028 08/18/17 0007 08/18/17 0409 08/18/17 0730   GLUCAP 200* 172* 144* 159* 175*       Signed:  Lacretia Nicks MD.  [facility] 08/18/2017, 9:06 PM

## 2017-08-19 ENCOUNTER — Telehealth: Payer: Self-pay | Admitting: *Deleted

## 2017-08-19 NOTE — Telephone Encounter (Signed)
-----   Message from Robyne PeersJane Brazeau, RN sent at 08/16/2017  4:06 PM EDT ----- Regarding: d/c call needed She should be discharged this week.  thanks

## 2017-08-19 NOTE — Telephone Encounter (Signed)
Left message on voicemail to return call.  No answer.   

## 2017-08-22 ENCOUNTER — Inpatient Hospital Stay: Payer: Self-pay | Admitting: Family Medicine

## 2017-09-22 ENCOUNTER — Inpatient Hospital Stay: Payer: Self-pay | Admitting: Family Medicine

## 2018-07-20 IMAGING — CT CT ABD-PELV W/ CM
2 of 4 series · 15 of 46 positions shown, 17 images · IV contrast (iopamidol)
Comparison: Right upper quadrant ultrasound 12/23/2016. CT of the
abdomen and pelvis 09/07/2014

CLINICAL DATA: Nausea and vomiting for 3 days. Mid abdominal pain.
New onset diabetes. Transaminitis.

EXAM:
CT ABDOMEN AND PELVIS WITH CONTRAST
TECHNIQUE: Multidetector CT imaging of the abdomen and pelvis was performed
using the standard protocol following bolus administration of
intravenous contrast.
CONTRAST:  100mL [id number] IOPAMIDOL ([id number]) INJECTION 61%

[Series 3: a/p w/ 5mm · axial · 0.66mm/px · z∈[+1016,+1431]mm · 12 of 95 slices shown, 14 images]
[im 8/95  soft-tissue]
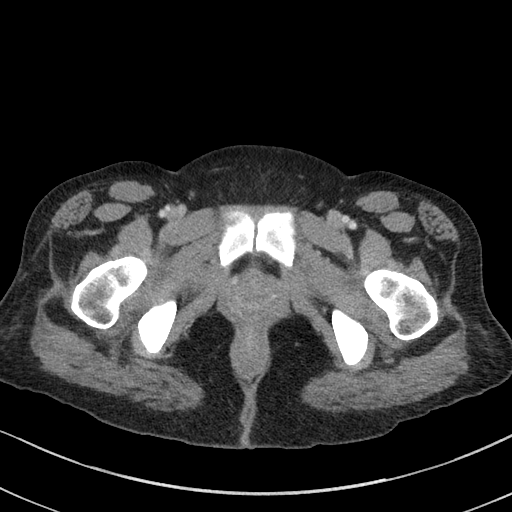
[im 8/95  bone]
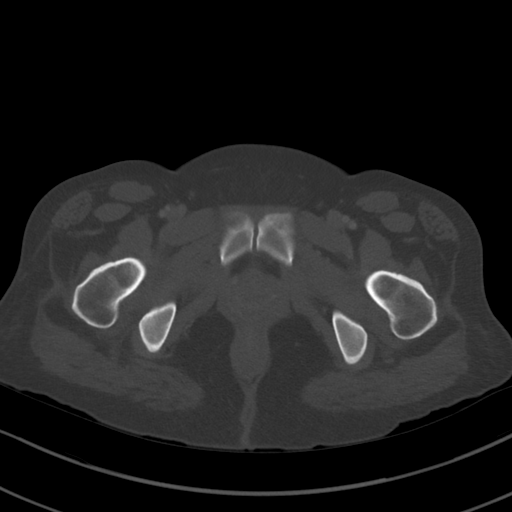
[im 16/95  soft-tissue]
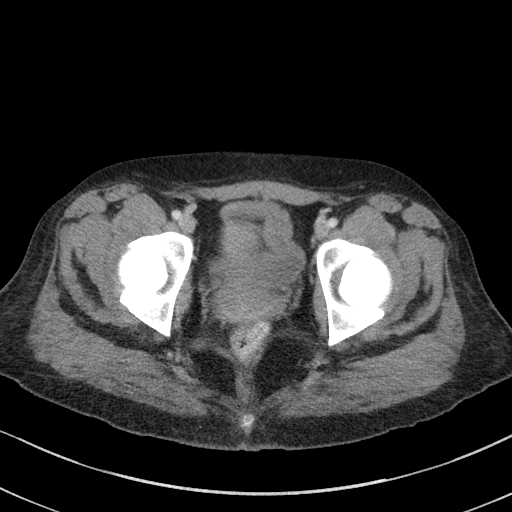
[im 23/95  soft-tissue]
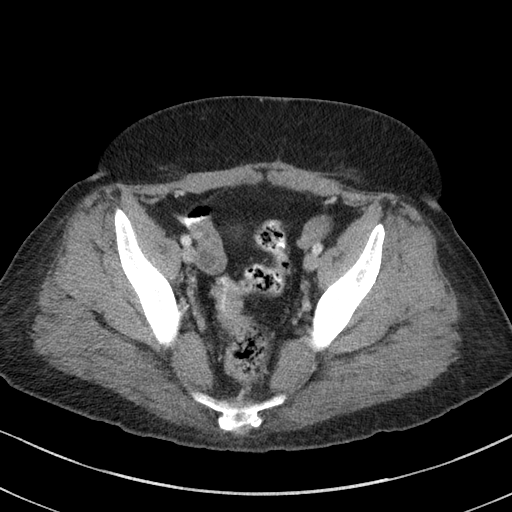
[im 31/95  soft-tissue]
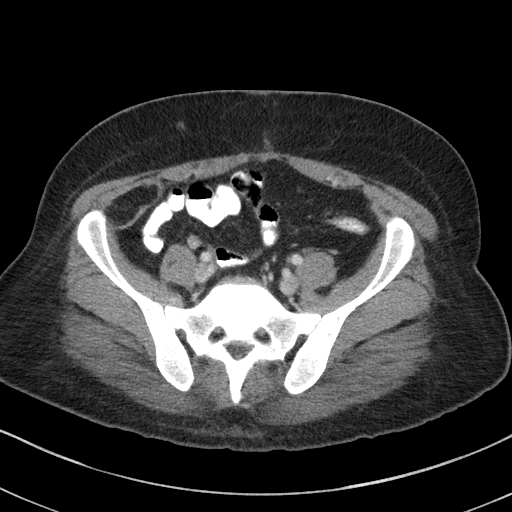
[im 38/95  soft-tissue]
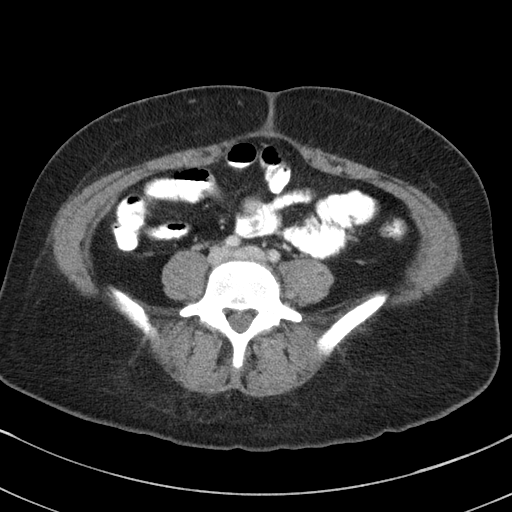
[im 46/95  soft-tissue]
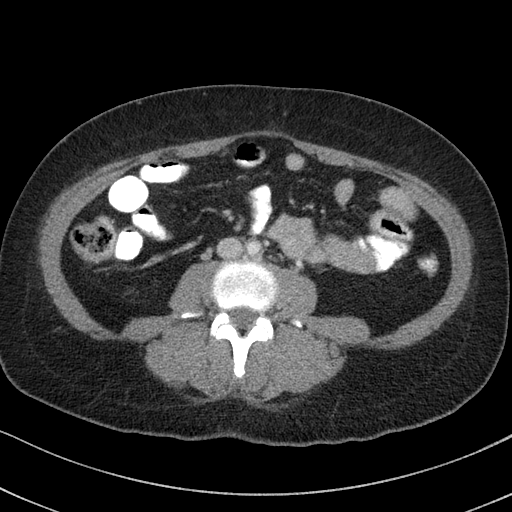
[im 53/95  soft-tissue]
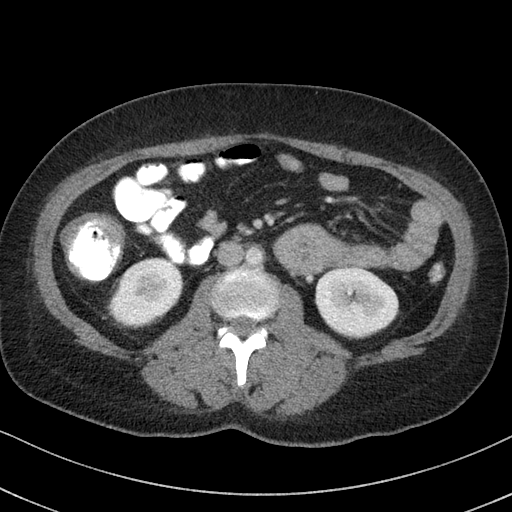
[im 61/95  soft-tissue]
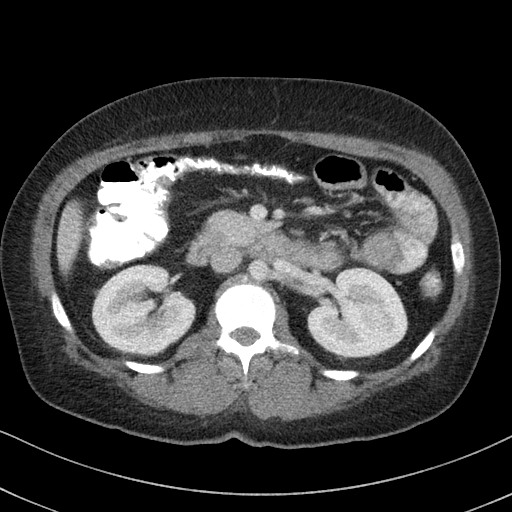
[im 68/95  soft-tissue]
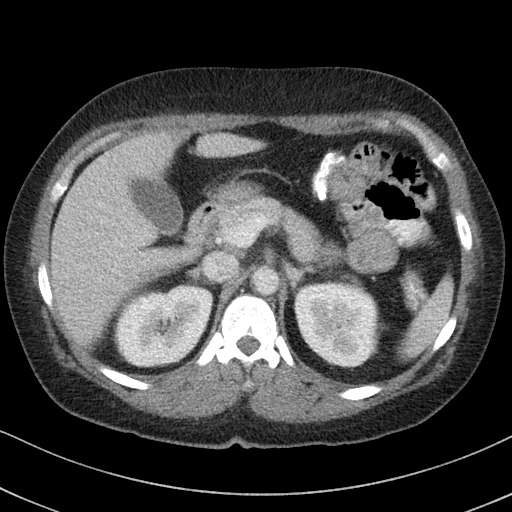
[im 68/95  bone]
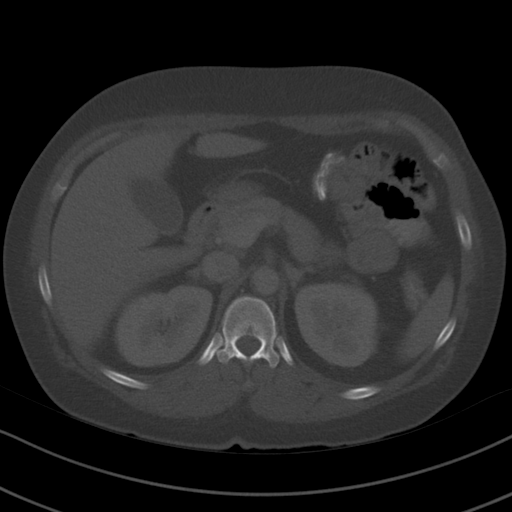
[im 76/95  soft-tissue]
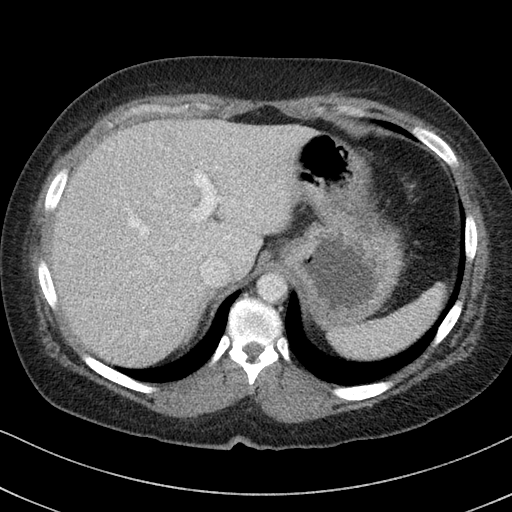
[im 83/95  soft-tissue]
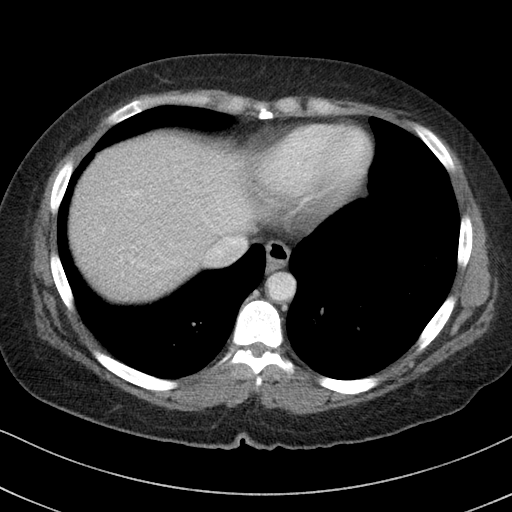
[im 91/95  soft-tissue]
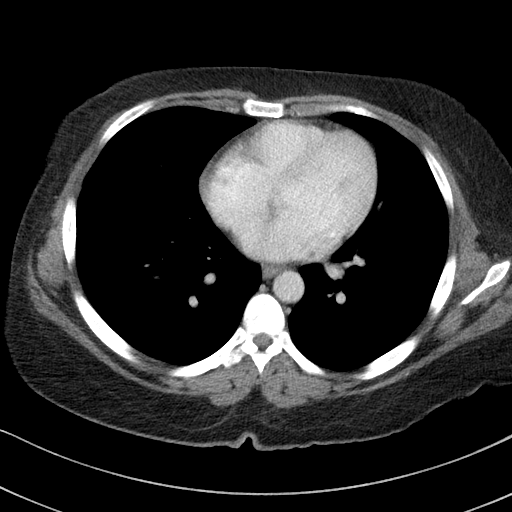

[Series 6: a/p w/ cor · coronal · 0.62mm/px · 3 of 108 slices shown]
[im 36/108  soft-tissue]
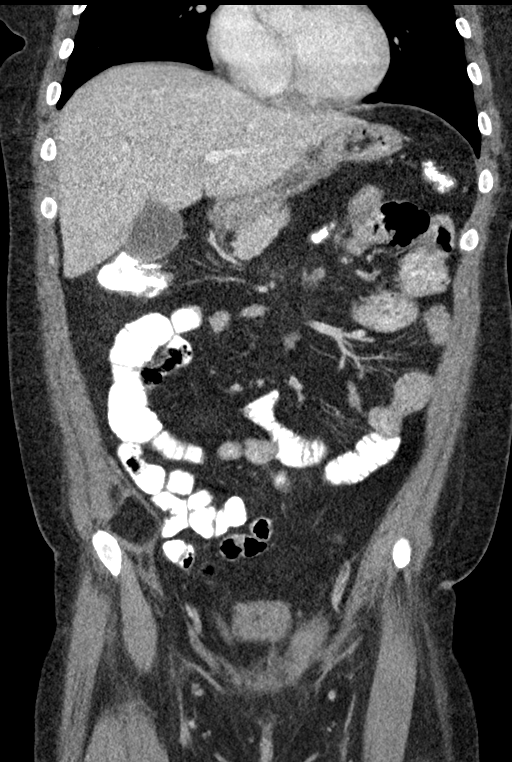
[im 48/108  soft-tissue]
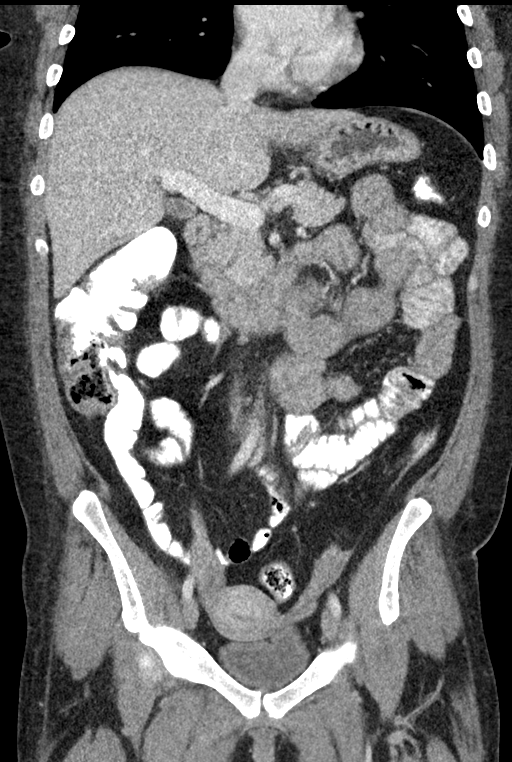
[im 60/108  soft-tissue]
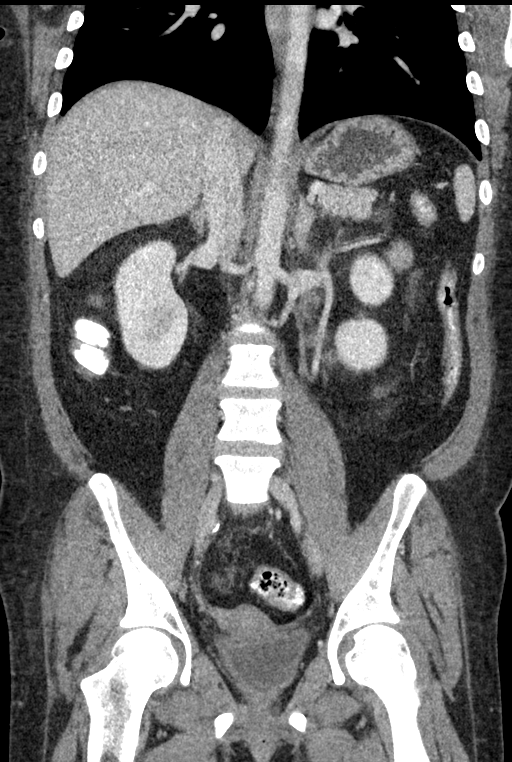

[15 of 46 positions shown; findings below may reference images not displayed]

FINDINGS: Lower chest: The lung bases are clear without focal nodule, mass, or
airspace disease. Heart size normal. No significant pleural or
pericardial effusion is present.

Hepatobiliary: There is focal fatty infiltration along the falciform
ligament. No other focal lesions are present. The liver is otherwise
unremarkable. The common bile duct and gallbladder are within normal
limits.

Pancreas: Unremarkable. No pancreatic ductal dilatation or
surrounding inflammatory changes.

Spleen: Normal in size without focal abnormality.

Adrenals/Urinary Tract: The adrenal glands are normal bilaterally.
Kidneys and ureters are within normal limits. The urinary bladder is
within normal limits.

Stomach/Bowel: The stomach and duodenum are within normal limits.
The small bowel is unremarkable. The appendix is visualized and
normal. The ascending and transverse colon are within normal limits.
The descending and sigmoid colon are unremarkable.

Vascular/Lymphatic: Atherosclerotic changes are present in the
distal aorta without aneurysm or significant stenosis. No
significant adenopathy is present.

Reproductive: Uterus and bilateral adnexa are unremarkable.

Other: No abdominal wall hernia or abnormality. No abdominopelvic
ascites.

Musculoskeletal: Vertebral body heights and alignment are
maintained. No focal lytic or blastic lesions are present. The bony
pelvis is within normal limits. The hips are unremarkable.
IMPRESSION: 1. No acute or focal lesion to explain the patient's abdominal pain,
nausea, or vomiting.
2. Aortic Atherosclerosis ([id number]). Changes of atherosclerosis
within the distal aorta without aneurysm or focal stenosis.

## 2019-11-08 ENCOUNTER — Emergency Department (HOSPITAL_BASED_OUTPATIENT_CLINIC_OR_DEPARTMENT_OTHER): Payer: Medicaid Other

## 2019-11-08 ENCOUNTER — Inpatient Hospital Stay (HOSPITAL_BASED_OUTPATIENT_CLINIC_OR_DEPARTMENT_OTHER)
Admission: EM | Admit: 2019-11-08 | Discharge: 2019-11-11 | DRG: 602 | Disposition: A | Discharge status: Home / Self Care | Payer: Medicaid Other | Attending: Family Medicine | Admitting: Family Medicine

## 2019-11-08 ENCOUNTER — Encounter (HOSPITAL_BASED_OUTPATIENT_CLINIC_OR_DEPARTMENT_OTHER): Payer: Self-pay | Admitting: Emergency Medicine

## 2019-11-08 ENCOUNTER — Other Ambulatory Visit: Payer: Self-pay

## 2019-11-08 DIAGNOSIS — Z794 Long term (current) use of insulin: Secondary | ICD-10-CM | POA: Diagnosis not present

## 2019-11-08 DIAGNOSIS — Z20822 Contact with and (suspected) exposure to covid-19: Secondary | ICD-10-CM | POA: Diagnosis present

## 2019-11-08 DIAGNOSIS — Z87891 Personal history of nicotine dependence: Secondary | ICD-10-CM | POA: Diagnosis not present

## 2019-11-08 DIAGNOSIS — E785 Hyperlipidemia, unspecified: Secondary | ICD-10-CM | POA: Diagnosis present

## 2019-11-08 DIAGNOSIS — L0231 Cutaneous abscess of buttock: Principal | ICD-10-CM | POA: Diagnosis present

## 2019-11-08 DIAGNOSIS — E1110 Type 2 diabetes mellitus with ketoacidosis without coma: Secondary | ICD-10-CM | POA: Diagnosis present

## 2019-11-08 DIAGNOSIS — Z833 Family history of diabetes mellitus: Secondary | ICD-10-CM

## 2019-11-08 DIAGNOSIS — R590 Localized enlarged lymph nodes: Secondary | ICD-10-CM | POA: Diagnosis present

## 2019-11-08 DIAGNOSIS — E1159 Type 2 diabetes mellitus with other circulatory complications: Secondary | ICD-10-CM | POA: Diagnosis present

## 2019-11-08 DIAGNOSIS — R7989 Other specified abnormal findings of blood chemistry: Secondary | ICD-10-CM | POA: Diagnosis present

## 2019-11-08 DIAGNOSIS — I10 Essential (primary) hypertension: Secondary | ICD-10-CM | POA: Diagnosis present

## 2019-11-08 DIAGNOSIS — L03317 Cellulitis of buttock: Secondary | ICD-10-CM | POA: Diagnosis present

## 2019-11-08 DIAGNOSIS — K219 Gastro-esophageal reflux disease without esophagitis: Secondary | ICD-10-CM | POA: Diagnosis present

## 2019-11-08 DIAGNOSIS — Z03818 Encounter for observation for suspected exposure to other biological agents ruled out: Secondary | ICD-10-CM | POA: Diagnosis not present

## 2019-11-08 DIAGNOSIS — B379 Candidiasis, unspecified: Secondary | ICD-10-CM

## 2019-11-08 DIAGNOSIS — E1010 Type 1 diabetes mellitus with ketoacidosis without coma: Secondary | ICD-10-CM

## 2019-11-08 DIAGNOSIS — Z841 Family history of disorders of kidney and ureter: Secondary | ICD-10-CM

## 2019-11-08 DIAGNOSIS — B373 Candidiasis of vulva and vagina: Secondary | ICD-10-CM | POA: Diagnosis not present

## 2019-11-08 LAB — BASIC METABOLIC PANEL
Anion gap: 13 (ref 5–15)
Anion gap: 10 (ref 5–15)
Anion gap: 11 (ref 5–15)
Anion gap: 12 (ref 5–15)
BUN: 7 mg/dL (ref 6–20)
BUN: 5 mg/dL — ABNORMAL LOW (ref 6–20)
BUN: 7 mg/dL (ref 6–20)
BUN: 6 mg/dL (ref 6–20)
CO2: 18 mmol/L — ABNORMAL LOW (ref 22–32)
CO2: 21 mmol/L — ABNORMAL LOW (ref 22–32)
CO2: 20 mmol/L — ABNORMAL LOW (ref 22–32)
CO2: 20 mmol/L — ABNORMAL LOW (ref 22–32)
Calcium: 7.9 mg/dL — ABNORMAL LOW (ref 8.9–10.3)
Calcium: 8.3 mg/dL — ABNORMAL LOW (ref 8.9–10.3)
Calcium: 8.5 mg/dL — ABNORMAL LOW (ref 8.9–10.3)
Calcium: 8.5 mg/dL — ABNORMAL LOW (ref 8.9–10.3)
Chloride: 106 mmol/L (ref 98–111)
Chloride: 106 mmol/L (ref 98–111)
Chloride: 105 mmol/L (ref 98–111)
Chloride: 105 mmol/L (ref 98–111)
Creatinine, Ser: 0.49 mg/dL (ref 0.44–1.00)
Creatinine, Ser: 0.38 mg/dL — ABNORMAL LOW (ref 0.44–1.00)
Creatinine, Ser: 0.37 mg/dL — ABNORMAL LOW (ref 0.44–1.00)
Creatinine, Ser: 0.36 mg/dL — ABNORMAL LOW (ref 0.44–1.00)
GFR calc Af Amer: >60 mL/min (ref >60)
GFR calc Af Amer: >60 mL/min (ref >60)
GFR calc Af Amer: >60 mL/min (ref >60)
GFR calc Af Amer: >60 mL/min (ref >60)
GFR calc non Af Amer: >60 mL/min (ref >60)
GFR calc non Af Amer: >60 mL/min (ref >60)
GFR calc non Af Amer: >60 mL/min (ref >60)
GFR calc non Af Amer: >60 mL/min (ref >60)
Glucose, Bld: 221 mg/dL — ABNORMAL HIGH (ref 70–99)
Glucose, Bld: 138 mg/dL — ABNORMAL HIGH (ref 70–99)
Glucose, Bld: 185 mg/dL — ABNORMAL HIGH (ref 70–99)
Glucose, Bld: 165 mg/dL — ABNORMAL HIGH (ref 70–99)
Potassium: 4 mmol/L (ref 3.5–5.1)
Potassium: 4.1 mmol/L (ref 3.5–5.1)
Potassium: 3.5 mmol/L (ref 3.5–5.1)
Potassium: 3.5 mmol/L (ref 3.5–5.1)
Sodium: 137 mmol/L (ref 135–145)
Sodium: 137 mmol/L (ref 135–145)
Sodium: 136 mmol/L (ref 135–145)
Sodium: 137 mmol/L (ref 135–145)

## 2019-11-08 LAB — COMPREHENSIVE METABOLIC PANEL
ALT: 111 U/L — ABNORMAL HIGH (ref 0–44)
AST: 54 U/L — ABNORMAL HIGH (ref 15–41)
Albumin: 3.8 g/dL (ref 3.5–5.0)
Alkaline Phosphatase: 223 U/L — ABNORMAL HIGH (ref 38–126)
Anion gap: 16 — ABNORMAL HIGH (ref 5–15)
BUN: 9 mg/dL (ref 6–20)
CO2: 16 mmol/L — ABNORMAL LOW (ref 22–32)
Calcium: 9.2 mg/dL (ref 8.9–10.3)
Chloride: 101 mmol/L (ref 98–111)
Creatinine, Ser: 0.57 mg/dL (ref 0.44–1.00)
GFR calc Af Amer: >60 mL/min (ref >60)
GFR calc non Af Amer: >60 mL/min (ref >60)
Glucose, Bld: 346 mg/dL — ABNORMAL HIGH (ref 70–99)
Potassium: 4.1 mmol/L (ref 3.5–5.1)
Sodium: 133 mmol/L — ABNORMAL LOW (ref 135–145)
Total Bilirubin: 0.9 mg/dL (ref 0.3–1.2)
Total Protein: 7.7 g/dL (ref 6.5–8.1)

## 2019-11-08 LAB — CBG MONITORING, ED
Glucose-Capillary: 338 mg/dL — ABNORMAL HIGH (ref 70–99)
Glucose-Capillary: 231 mg/dL — ABNORMAL HIGH (ref 70–99)
Glucose-Capillary: 147 mg/dL — ABNORMAL HIGH (ref 70–99)
Glucose-Capillary: 132 mg/dL — ABNORMAL HIGH (ref 70–99)
Glucose-Capillary: 123 mg/dL — ABNORMAL HIGH (ref 70–99)
Glucose-Capillary: 124 mg/dL — ABNORMAL HIGH (ref 70–99)
Glucose-Capillary: 223 mg/dL — ABNORMAL HIGH (ref 70–99)
Glucose-Capillary: 232 mg/dL — ABNORMAL HIGH (ref 70–99)

## 2019-11-08 LAB — CBC WITH DIFFERENTIAL/PLATELET
Abs Immature Granulocytes: 0.08 10*3/uL — ABNORMAL HIGH (ref 0.00–0.07)
Basophils Absolute: 0 10*3/uL (ref 0.0–0.1)
Basophils Relative: 0 %
Eosinophils Absolute: 0 10*3/uL (ref 0.0–0.5)
Eosinophils Relative: 0 %
HCT: 43.1 % (ref 36.0–46.0)
Hemoglobin: 14 g/dL (ref 12.0–15.0)
Immature Granulocytes: 1 %
Lymphocytes Relative: 11 %
Lymphs Abs: 1.5 10*3/uL (ref 0.7–4.0)
MCH: 31.3 pg (ref 26.0–34.0)
MCHC: 32.5 g/dL (ref 30.0–36.0)
MCV: 96.2 fL (ref 80.0–100.0)
Monocytes Absolute: 0.6 10*3/uL (ref 0.1–1.0)
Monocytes Relative: 5 %
Neutro Abs: 10.7 10*3/uL — ABNORMAL HIGH (ref 1.7–7.7)
Neutrophils Relative %: 83 %
Platelets: 374 10*3/uL (ref 150–400)
RBC: 4.48 MIL/uL (ref 3.87–5.11)
RDW: 12 % (ref 11.5–15.5)
WBC: 12.9 10*3/uL — ABNORMAL HIGH (ref 4.0–10.5)
nRBC: 0 % (ref 0.0–0.2)

## 2019-11-08 LAB — CBC
HCT: 37.4 % (ref 36.0–46.0)
Hemoglobin: 12.2 g/dL (ref 12.0–15.0)
MCH: 31.5 pg (ref 26.0–34.0)
MCHC: 32.6 g/dL (ref 30.0–36.0)
MCV: 96.6 fL (ref 80.0–100.0)
Platelets: 343 10*3/uL (ref 150–400)
RBC: 3.87 MIL/uL (ref 3.87–5.11)
RDW: 11.9 % (ref 11.5–15.5)
WBC: 11.1 10*3/uL — ABNORMAL HIGH (ref 4.0–10.5)
nRBC: 0 % (ref 0.0–0.2)

## 2019-11-08 LAB — URINALYSIS, MICROSCOPIC (REFLEX)

## 2019-11-08 LAB — I-STAT VENOUS BLOOD GAS, ED
Acid-base deficit: 7 mmol/L — ABNORMAL HIGH (ref 0.0–2.0)
Bicarbonate: 19.7 mmol/L — ABNORMAL LOW (ref 20.0–28.0)
Calcium, Ion: 1.21 mmol/L (ref 1.15–1.40)
HCT: 42 % (ref 36.0–46.0)
Hemoglobin: 14.3 g/dL (ref 12.0–15.0)
O2 Saturation: 43 %
Potassium: 4.2 mmol/L (ref 3.5–5.1)
Sodium: 139 mmol/L (ref 135–145)
TCO2: 21 mmol/L — ABNORMAL LOW (ref 22–32)
pCO2, Ven: 43.3 mmHg — ABNORMAL LOW (ref 44.0–60.0)
pH, Ven: 7.267 (ref 7.250–7.430)
pO2, Ven: 28 mmHg — CL (ref 32.0–45.0)

## 2019-11-08 LAB — GLUCOSE, CAPILLARY
Glucose-Capillary: 158 mg/dL — ABNORMAL HIGH (ref 70–99)
Glucose-Capillary: 169 mg/dL — ABNORMAL HIGH (ref 70–99)
Glucose-Capillary: 190 mg/dL — ABNORMAL HIGH (ref 70–99)

## 2019-11-08 LAB — MRSA PCR SCREENING: MRSA by PCR: NEGATIVE

## 2019-11-08 LAB — URINALYSIS, ROUTINE W REFLEX MICROSCOPIC
Bilirubin Urine: NEGATIVE
Glucose, UA: >=500 mg/dL — AB
Ketones, ur: >80 mg/dL — AB
Leukocytes,Ua: NEGATIVE
Nitrite: NEGATIVE
Protein, ur: NEGATIVE mg/dL
Specific Gravity, Urine: >1.03 — ABNORMAL HIGH (ref 1.005–1.030)
pH: 5.5 (ref 5.0–8.0)

## 2019-11-08 LAB — BETA-HYDROXYBUTYRIC ACID
Beta-Hydroxybutyric Acid: 4.51 mmol/L — ABNORMAL HIGH (ref 0.05–0.27)
Beta-Hydroxybutyric Acid: 0.48 mmol/L — ABNORMAL HIGH (ref 0.05–0.27)
Beta-Hydroxybutyric Acid: 0.41 mmol/L — ABNORMAL HIGH (ref 0.05–0.27)

## 2019-11-08 LAB — SARS CORONAVIRUS 2 BY RT PCR (HOSPITAL ORDER, PERFORMED IN ~~LOC~~ HOSPITAL LAB): SARS Coronavirus 2: NEGATIVE

## 2019-11-08 LAB — PREGNANCY, URINE: Preg Test, Ur: NEGATIVE

## 2019-11-08 LAB — HEMOGLOBIN A1C
Hgb A1c MFr Bld: 10.7 % — ABNORMAL HIGH (ref 4.8–5.6)
Mean Plasma Glucose: 260.39 mg/dL

## 2019-11-08 MED ORDER — ORAL CARE MOUTH RINSE: 15.0000 mL | Freq: Two times a day (BID) | OROMUCOSAL | Status: DC

## 2019-11-08 MED ORDER — DEXTROSE-NACL 5-0.45 % IV SOLN: INTRAVENOUS | Status: DC

## 2019-11-08 MED ORDER — ACETAMINOPHEN 500 MG PO TABS
1000.0000 mg | ORAL_TABLET | Freq: Once | ORAL | Status: AC
  Administered 2019-11-08: 1000 mg via ORAL
  Filled 2019-11-08: qty 2

## 2019-11-08 MED ORDER — SODIUM CHLORIDE 0.9 % IV SOLN: INTRAVENOUS | Status: DC

## 2019-11-08 MED ORDER — POTASSIUM CHLORIDE 10 MEQ/100ML IV SOLN
10.0000 meq | INTRAVENOUS | Status: AC
  Administered 2019-11-08 – 2019-11-09 (×2): 10 meq via INTRAVENOUS
  Filled 2019-11-08 (×2): qty 100

## 2019-11-08 MED ORDER — SODIUM CHLORIDE 0.9 % IV BOLUS
1000.0000 mL | Freq: Once | INTRAVENOUS | Status: AC
  Administered 2019-11-08: 1000 mL via INTRAVENOUS

## 2019-11-08 MED ORDER — MORPHINE SULFATE (PF) 4 MG/ML IV SOLN
4.0000 mg | Freq: Once | INTRAVENOUS | Status: AC
  Administered 2019-11-08: 4 mg via INTRAVENOUS
  Filled 2019-11-08: qty 1

## 2019-11-08 MED ORDER — ONDANSETRON HCL 4 MG/2ML IJ SOLN
4.0000 mg | Freq: Once | INTRAMUSCULAR | Status: AC
  Administered 2019-11-08: 4 mg via INTRAVENOUS
  Filled 2019-11-08: qty 2

## 2019-11-08 MED ORDER — IOHEXOL 300 MG/ML  SOLN
100.0000 mL | Freq: Once | INTRAMUSCULAR | Status: AC | PRN
  Administered 2019-11-08: 100 mL via INTRAVENOUS

## 2019-11-08 MED ORDER — DEXTROSE 50 % IV SOLN: 0.0000 mL | INTRAVENOUS | Status: DC | PRN

## 2019-11-08 MED ORDER — SODIUM CHLORIDE 0.9 % IV SOLN
INTRAVENOUS | Status: DC | PRN
  Administered 2019-11-08 (×2): 250 mL via INTRAVENOUS
  Administered 2019-11-09: 1000 mL via INTRAVENOUS

## 2019-11-08 MED ORDER — CHLORHEXIDINE GLUCONATE CLOTH 2 % EX PADS
6.0000 | MEDICATED_PAD | Freq: Every day | CUTANEOUS | Status: DC
  Administered 2019-11-08: 6 via TOPICAL

## 2019-11-08 MED ORDER — VANCOMYCIN HCL 1500 MG/300ML IV SOLN
1500.0000 mg | Freq: Once | INTRAVENOUS | Status: AC
  Administered 2019-11-08: 1500 mg via INTRAVENOUS
  Filled 2019-11-08: qty 300

## 2019-11-08 MED ORDER — SODIUM CHLORIDE 0.9 % IV SOLN
2.0000 g | Freq: Once | INTRAVENOUS | Status: AC
  Administered 2019-11-08: 2 g via INTRAVENOUS
  Filled 2019-11-08: qty 2

## 2019-11-08 MED ORDER — INSULIN REGULAR(HUMAN) IN NACL 100-0.9 UT/100ML-% IV SOLN
INTRAVENOUS | Status: DC
  Administered 2019-11-08: 6.5 [IU]/h via INTRAVENOUS
  Filled 2019-11-08: qty 100

## 2019-11-08 MED ORDER — ENOXAPARIN SODIUM 40 MG/0.4ML ~~LOC~~ SOLN
40.0000 mg | Freq: Every day | SUBCUTANEOUS | Status: DC
  Administered 2019-11-08 – 2019-11-10 (×3): 40 mg via SUBCUTANEOUS
  Filled 2019-11-08 (×3): qty 0.4

## 2019-11-08 MED ORDER — INSULIN REGULAR(HUMAN) IN NACL 100-0.9 UT/100ML-% IV SOLN: INTRAVENOUS | Status: DC

## 2019-11-08 MED ORDER — OXYCODONE HCL 5 MG PO TABS
5.0000 mg | ORAL_TABLET | Freq: Once | ORAL | Status: AC
  Administered 2019-11-08: 5 mg via ORAL
  Filled 2019-11-08: qty 1

## 2019-11-08 MED ORDER — CHLORHEXIDINE GLUCONATE 0.12 % MT SOLN
15.0000 mL | Freq: Two times a day (BID) | OROMUCOSAL | Status: DC
  Administered 2019-11-08: 15 mL via OROMUCOSAL
  Filled 2019-11-08: qty 15

## 2019-11-08 MED ORDER — POTASSIUM CHLORIDE 10 MEQ/100ML IV SOLN
10.0000 meq | INTRAVENOUS | Status: AC
  Administered 2019-11-08 (×2): 10 meq via INTRAVENOUS
  Filled 2019-11-08 (×2): qty 100

## 2019-11-08 MED ORDER — CEFAZOLIN SODIUM-DEXTROSE 1-4 GM/50ML-% IV SOLN
1.0000 g | Freq: Once | INTRAVENOUS | Status: AC
  Administered 2019-11-08: 1 g via INTRAVENOUS
  Filled 2019-11-08: qty 50

## 2019-11-08 MED ORDER — DIPHENHYDRAMINE HCL 50 MG/ML IJ SOLN
25.0000 mg | Freq: Once | INTRAMUSCULAR | Status: AC
  Administered 2019-11-09: 25 mg via INTRAVENOUS
  Filled 2019-11-08: qty 1

## 2019-11-08 NOTE — H&P (Signed)
History and Physical   Cheryl Parker OVF:[id number] DOB: 1981/04/09 DOA: 11/08/2019  Referring MD/NP/PA: Dr. Bernette Mayers  PCP: Patient, No Pcp Per   Outpatient Specialists: None  Patient coming from: [facility]  Chief Complaint: Abscess in the right buttocks  HPI: Cheryl Parker is a 39 y.o. female with medical history significant of type 1 diabetes with prior DKA, hypertension, GERD, who came to the ER essentially complaining of abscess of the right buttock that came in 3 days ago.  This has busted out with some drainage.  Patient tried Neosporin and peroxide on the area but appears not to have improved.  She also reported taking some apple cider with vinegar as well as water to lower her blood pressure based on which she is on [employer].  Since then she has been having abdominal pain and feels unwell.  In the ER patient was found to be in diabetic ketoacidosis.  Also noted to have the right buttocks cellulitis.  Based on imaging there was no drainable abscess.  She is being admitted for the management of the DKA but also cellulitis of the right buttocks.  No fever or chills at the moment.  She is unable to sit on the right buttock secondary to the pain..  ED Course: Temperature 98.6 blood pressure 140/97 pulse 120 respirate 25 oxygen sat 98% on room air.  White count 11.1 CO2 20 creatinine 0.36 and calcium 8.5 the rest of the lab work appeared to be within normal.  Initial chemistry showed an anion gap of 16.  Ketosis and evidence of diabetic ketoacidosis.  Patient was initiated on IV insulin drip and admitted to the hospital for treatment.  Review of Systems: As per HPI otherwise 10 point review of systems negative.    Past Medical History:  Diagnosis Date  . DKA (diabetic ketoacidoses) (HCC) 09/10/2014; 06/02/2017  . GERD (gastroesophageal reflux disease)   . Gestational diabetes   . Headache   . Hypertension   . Nausea & vomiting 07/2017  . Pregnancy induced  hypertension   . Type 1 diabetes mellitus (HCC)    Insulin dependent    Past Surgical History:  Procedure Laterality Date  . CESAREAN SECTION  2011  . CESAREAN SECTION N/A 07/05/2012   Procedure: CESAREAN SECTION;  Surgeon: Catalina Antigua, MD;  Location: [facility];  Service: Obstetrics;  Laterality: N/A;  . CESAREAN SECTION N/A 06/25/2015   Procedure: CESAREAN SECTION;  Surgeon: Catalina Antigua, MD;  Location: [facility];  Service: Obstetrics;  Laterality: N/A;  . ESOPHAGOGASTRODUODENOSCOPY (EGD) WITH PROPOFOL Left 12/28/2016   Procedure: ESOPHAGOGASTRODUODENOSCOPY (EGD) WITH PROPOFOL;  Surgeon: Kerin Salen, MD;  Location: [facility] ENDOSCOPY;  Service: Gastroenterology;  Laterality: Left;     reports that she has quit smoking. Her smoking use included cigarettes. She has a 1.70 pack-year smoking history. She has never used smokeless tobacco. She reports that she does not drink alcohol and does not use drugs.  No Known Allergies  Family History  Problem Relation Age of Onset  . Diabetes Mother   . Kidney disease Father   . Birth defects Son        unilateral renal agenesis     Prior to Admission medications   Medication Sig Start Date End Date Taking? Authorizing Provider  insulin NPH-regular Human (NOVOLIN 70/30) (70-30) 100 UNIT/ML injection Inject 13 Units into skin two times daily with a meal Patient taking differently: Inject 13 Units into the skin 3 (three) times daily. Inject 13 Units into  skin two times daily with a meal 06/06/17  Yes Briant CedarEzenduka, Nkeiruka J, MD  acetaminophen (TYLENOL) 500 MG tablet Take 1,000 mg by mouth every 6 (six) hours as needed for mild pain.    [provider]  lisinopril (PRINIVIL,ZESTRIL) 5 MG tablet Take 1 tablet (5 mg total) by mouth daily. 08/17/17   Zigmund DanielPowell, A Caldwell Jr., MD  omeprazole (PRILOSEC) 20 MG capsule Take 1 capsule (20 mg total) by mouth daily. 08/17/17 09/16/17  Zigmund DanielPowell, A Caldwell Jr., MD  polyethylene glycol [facility](MIRALAX) packet Take 17 g by mouth  daily. 08/17/17   Zigmund DanielPowell, A Caldwell Jr., MD  promethazine (PHENERGAN) 12.5 MG tablet Take 1 tablet (12.5 mg total) by mouth every 6 (six) hours as needed for up to 20 doses for nausea or vomiting. 08/18/17   Zigmund DanielPowell, A Caldwell Jr., MD    Physical Exam: Vitals:   11/08/19 2030 11/08/19 2200 11/08/19 2300 11/09/19 0000  BP: 129/78 111/75 (!) 108/59 (!) 109/59  Pulse: (!) 107 98 93 95  Resp: (!) 9 (!) 22 16 20   Temp:    97.7 F (36.5 C)  TempSrc:    Oral  SpO2: 100% 98% 98% 98%  Weight:      Height:          Constitutional: Acutely ill looking, no distress Vitals:   11/08/19 2030 11/08/19 2200 11/08/19 2300 11/09/19 0000  BP: 129/78 111/75 (!) 108/59 (!) 109/59  Pulse: (!) 107 98 93 95  Resp: (!) 9 (!) 22 16 20   Temp:    97.7 F (36.5 C)  TempSrc:    Oral  SpO2: 100% 98% 98% 98%  Weight:      Height:       Eyes: PERRL, lids and conjunctivae normal ENMT: Mucous membranes are dry. Posterior pharynx clear of any exudate or lesions.Normal dentition.  Neck: normal, supple, no masses, no thyromegaly Respiratory: clear to auscultation bilaterally, no wheezing, no crackles. Normal respiratory effort. No accessory muscle use.  Cardiovascular: Sinus tachycardia, no murmurs / rubs / gallops. No extremity edema. 2+ pedal pulses. No carotid bruits.  Abdomen: no tenderness, no masses palpated. No hepatosplenomegaly. Bowel sounds positive.  Musculoskeletal: no clubbing / cyanosis. No joint deformity upper and lower extremities. Good ROM, no contractures. Normal muscle tone.  Skin: Right buttock swollen red tender with an open carbuncle in the medial cleft draining clear fluid  neurologic: CN 2-12 grossly intact. Sensation intact, DTR normal. Strength 5/5 in all 4.  Psychiatric: Normal judgment and insight. Alert and oriented x 3. Normal mood.     Labs on Admission: I have personally reviewed following labs and imaging studies  CBC: Recent Labs  Lab 11/08/19 1152 11/08/19 1422  11/08/19 2237  WBC 12.9*  --  11.1*  NEUTROABS 10.7*  --   --   HGB 14.0 14.3 12.2  HCT 43.1 42.0 37.4  MCV 96.2  --  96.6  PLT 374  --  343   Basic Metabolic Panel: Recent Labs  Lab 11/08/19 1152 11/08/19 1152 11/08/19 1422 11/08/19 1428 11/08/19 1745 11/08/19 2202 11/08/19 2237  NA 133*   < > 139 137 137 136 137  K 4.1   < > 4.2 4.0 4.1 3.5 3.5  CL 101  --   --  106 106 105 105  CO2 16*  --   --  18* 21* 20* 20*  GLUCOSE 346*  --   --  221* 138* 185* 165*  BUN 9  --   --  7 5* 7 6  CREATININE 0.57  --   --  0.49 0.38* 0.37* 0.36*  CALCIUM 9.2  --   --  7.9* 8.3* 8.5* 8.5*   < > = values in this interval not displayed.   GFR: Estimated Creatinine Clearance: 87.6 mL/min (A) (by C-G formula based on SCr of 0.36 mg/dL (L)). Liver Function Tests: Recent Labs  Lab 11/08/19 1152  AST 54*  ALT 111*  ALKPHOS 223*  BILITOT 0.9  PROT 7.7  ALBUMIN 3.8   No results for input(s): LIPASE, AMYLASE in the last 168 hours. No results for input(s): AMMONIA in the last 168 hours. Coagulation Profile: No results for input(s): INR, PROTIME in the last 168 hours. Cardiac Enzymes: No results for input(s): CKTOTAL, CKMB, CKMBINDEX, TROPONINI in the last 168 hours. BNP (last 3 results) No results for input(s): PROBNP in the last 8760 hours. HbA1C: Recent Labs    11/08/19 2237  HGBA1C 10.7*   CBG: Recent Labs  Lab 11/08/19 1946 11/08/19 2029 11/08/19 2148 11/08/19 2245 11/08/19 2351  GLUCAP 232* 223* 190* 169* 158*   Lipid Profile: No results for input(s): CHOL, HDL, LDLCALC, TRIG, CHOLHDL, LDLDIRECT in the last 72 hours. Thyroid Function Tests: No results for input(s): TSH, T4TOTAL, FREET4, T3FREE, THYROIDAB in the last 72 hours. Anemia Panel: No results for input(s): VITAMINB12, FOLATE, FERRITIN, TIBC, IRON, RETICCTPCT in the last 72 hours. Urine analysis:    Component Value Date/Time   COLORURINE YELLOW 11/08/2019 1152   APPEARANCEUR CLEAR 11/08/2019 1152    LABSPEC >1.030 (H) 11/08/2019 1152   PHURINE 5.5 11/08/2019 1152   GLUCOSEU >=500 (A) 11/08/2019 1152   HGBUR TRACE (A) 11/08/2019 1152   BILIRUBINUR NEGATIVE 11/08/2019 1152   KETONESUR >80 (A) 11/08/2019 1152   PROTEINUR NEGATIVE 11/08/2019 1152   UROBILINOGEN 0.2 06/23/2015 0852   NITRITE NEGATIVE 11/08/2019 1152   LEUKOCYTESUR NEGATIVE 11/08/2019 1152   Sepsis Labs: (procalcitonin:4,lacticidven:4) ) Recent Results (from the past 240 hour(s))  SARS Coronavirus 2 by RT PCR (hospital order, performed in [facility] hospital lab) Nasopharyngeal Peripheral     Status: None   Collection Time: 11/08/19  1:54 PM   Specimen: Peripheral; Nasopharyngeal  Result Value Ref Range Status   SARS Coronavirus 2 NEGATIVE NEGATIVE Final    Comment: (NOTE) SARS-CoV-2 target nucleic acids are NOT DETECTED.  The SARS-CoV-2 RNA is generally detectable in upper and lower respiratory specimens during the acute phase of infection. The lowest concentration of SARS-CoV-2 viral copies this assay can detect is 250 copies / mL. A negative result does not preclude SARS-CoV-2 infection and should not be used as the sole basis for treatment or other patient management decisions.  A negative result may occur with improper specimen collection / handling, submission of specimen other than nasopharyngeal swab, presence of viral mutation(s) within the areas targeted by this assay, and inadequate number of viral copies (<250 copies / mL). A negative result must be combined with clinical observations, patient history, and epidemiological information.  Fact Sheet for Patients:   BoilerBrush.com.cy  Fact Sheet for Healthcare Providers: https://pope.com/  This test is not yet approved or  cleared by the [place] FDA and has been authorized for detection and/or diagnosis of SARS-CoV-2 by FDA under an Emergency Use Authorization (EUA).  This EUA will  remain in effect (meaning this test can be used) for the duration of the COVID-19 declaration under Section 564(b)(1) of the Act, 21 U.S.C. section 360bbb-3(b)(1), unless the authorization is terminated or revoked sooner.  Performed at [facility], [address] [place], [place] [address]   MRSA PCR Screening     Status: None   Collection Time: 11/08/19  9:46 PM   Specimen: Nasal Mucosa; Nasopharyngeal  Result Value Ref Range Status   MRSA by PCR NEGATIVE NEGATIVE Final    Comment:        The GeneXpert MRSA Assay (FDA approved for NASAL specimens only), is one component of a comprehensive MRSA colonization surveillance program. It is not intended to diagnose MRSA infection nor to guide or monitor treatment for MRSA infections. Performed at [facility], [address], [place], [place] [address]      Radiological Exams on Admission: CT ABDOMEN PELVIS W CONTRAST  Result Date: 11/08/2019 CLINICAL DATA:  Abscess to buttocks for 2 days. Right lower quadrant pain for 3 days. Nausea. EXAM: CT ABDOMEN AND PELVIS WITH CONTRAST TECHNIQUE: Multidetector CT imaging of the abdomen and pelvis was performed using the standard protocol following bolus administration of intravenous contrast. CONTRAST:  OMNIPAQUE IOHEXOL 300 MG/ML  SOLN COMPARISON:  06/03/2017 FINDINGS: Lower chest: Unremarkable Hepatobiliary: No suspicious focal abnormality within the liver parenchyma. Small area of low attenuation in the anterior liver, adjacent to the falciform ligament, is in a characteristic location for focal fatty deposition. There is no evidence for gallstones, gallbladder wall thickening, or pericholecystic fluid. No intrahepatic or extrahepatic biliary dilation. Pancreas: No focal mass lesion. No dilatation of the main duct. No intraparenchymal cyst. No peripancreatic edema. Spleen: No splenomegaly. No focal mass lesion. Adrenals/Urinary Tract: No adrenal nodule or mass.  No evidence for hydroureter. The urinary bladder appears normal for the degree of distention. Stomach/Bowel: Stomach is unremarkable. No gastric wall thickening. No evidence of outlet obstruction. Duodenum is normally positioned as is the ligament of Treitz. No small bowel wall thickening. No small bowel dilatation. The terminal ileum is normal. The appendix is normal. No gross colonic mass. No colonic wall thickening. There is perianal edema/inflammation with a linear band of soft tissue tracking from the right perianal region through the subcutaneous fat towards the right skin of the intergluteal fold, similar to prior study. Associated subcutaneous edema/fluid is progressive in the interval. Vascular/Lymphatic: There is abdominal aortic atherosclerosis without aneurysm. There is no gastrohepatic or hepatoduodenal ligament lymphadenopathy. No retroperitoneal or mesenteric lymphadenopathy. No pelvic sidewall lymphadenopathy. Interval development of lymphadenopathy in the right groin with 1.4 cm short axis lymph node visible on image 81/2. Reproductive: The uterus is unremarkable.  There is no adnexal mass. Other: No intraperitoneal free fluid. Musculoskeletal: No worrisome lytic or sclerotic osseous abnormality. Sclerotic foci in the sacrum are probably bone islands, stable in the interval. Lipoma in the right lower lateral abdominal wall musculature is probably in the plane between the transversus abdominus and the internal oblique, stable since prior. IMPRESSION: 1. Similar appearance of the perianal edema/inflammation with a linear band of soft tissue tracking from the right perianal region through the subcutaneous fat towards the right skin of the intergluteal fold. As this was present previously and may reflect scarring. Associated subcutaneous edema/fluid is progressive in the interval. Perianal fistula not excluded. Cellulitis cannot be excluded. No evidence for drainable abscess at this time. 2. Interval  development of right inguinal lymphadenopathy, likely reactive. Follow-up CT in 3 months could be used to ensure resolution. 3. Aortic Atherosclerosis (ICD10-I70.0). Electronically Signed   By: Kennith Center M.D.   On: 11/08/2019 12:59    EKG: Independently reviewed.  Sinus tachycardia  Assessment/Plan Principal Problem:   DKA (diabetic ketoacidoses) (HCC) Active Problems:   HLD (hyperlipidemia)   Essential hypertension     #1 diabetic ketoacidosis: Appears to be resolving.  Patient will be admitted and placed on the hyperglycemic protocol with DKA.  Monitor BMP as well as CBG frequently.  Once gap is completely closed transition to subcutaneous insulin.  #2 right buttock abscess: Continue with IV antibiotics.  We will stop Vanco and cefepime and obtain blood cultures.  Also monitor cultures.  De-escalation of antibiotics within the next 24 to 48 hours.  Perianal fistula not excluded by CT.  If symptoms persist will get surgical consult  #3 hypertension: Blood pressure appears controlled.  Use home regimen.  #4 hyperlipidemia: Continue statin  DVT prophylaxis: Lovenox Code Status: Full code Family Communication: No family at bedside Disposition Plan: Home Consults called: None Admission status: Inpatient  Severity of Illness: The appropriate patient status for this patient is INPATIENT. Inpatient status is judged to be reasonable and necessary in order to provide the required intensity of service to ensure the patient's safety. The patient's presenting symptoms, physical exam findings, and initial radiographic and laboratory data in the context of their chronic comorbidities is felt to place them at high risk for further clinical deterioration. Furthermore, it is not anticipated that the patient will be medically stable for discharge from the hospital within 2 midnights of admission. The following factors support the patient status of inpatient.   " The patient's presenting symptoms  include right buttocks infection and DKA. " The worrisome physical exam findings include swelling and drainage on the right buttocks. " The initial radiographic and laboratory data are worrisome because of no evidence of abscess. " The chronic co-morbidities include type 1 diabetes.   * I certify that at the point of admission it is my clinical judgment that the patient will require inpatient hospital care spanning beyond 2 midnights from the point of admission due to high intensity of service, high risk for further deterioration and high frequency of surveillance required.Lonia Blood MD [facility] Pager [phone]  If 7PM-7AM, please contact night-coverage [email or web address] Password [facility]  11/09/2019, 12:45 AM

## 2019-11-08 NOTE — ED Notes (Signed)
[facility] notified (Jaime) - patient ready for transport 

## 2019-11-08 NOTE — ED Triage Notes (Signed)
Abscess to buttocks x 2 days. RLQ pain x 3 days with nausea.

## 2019-11-08 NOTE — ED Notes (Signed)
Assisted pt with [facility].  

## 2019-11-08 NOTE — Plan of Care (Signed)
Pt admitted with DKA. Known history of DM. States she is able to get access to her medications, and takes insulin regularly. Pt states she has not had an appetite for the last three days.

## 2019-11-08 NOTE — ED Provider Notes (Signed)
[facility] EMERGENCY DEPARTMENT Provider Note   CSN: [id number] Arrival date & time: 11/08/19  1044     History Chief Complaint  Patient presents with   Abdominal Pain   Abscess    Cheryl Parker is a 39 y.o. female with a past medical history of IDDM, hypertension, GERD presenting to the ED with a chief complaint of abdominal pain and abscess. 1.  She noticed an abscess to her right buttock 3 days ago.  She has used peroxide and Neosporin in the area.  Reports some drainage from the area.  No history of abscesses in the past.  States that the discomfort radiates to her hip and right lower quadrant.  Denies any fevers. 2.  Has been having abdominal pain.  States that she read on Facebook 3 days ago that if you mix apple cider vinegar with water it will lower your blood pressure.  She drank this 3 days ago and since then has been having a cramping pain in her abdomen with associated nausea and vomiting.  Denies any changes to bowel movements.  Denies any dysuria or vaginal complaints.  She is unsure if she could be pregnant as her last menstrual cycle was on 10/01/2019.  Denies any sick contacts with similar symptoms. States that she has been checking her sugars and this morning was in the 300s.  She has been taking her insulin as prescribed.  HPI     Past Medical History:  Diagnosis Date   DKA (diabetic ketoacidoses) (HCC) 09/10/2014; 06/02/2017   GERD (gastroesophageal reflux disease)    Gestational diabetes    Headache    Hypertension    Nausea & vomiting 07/2017   Pregnancy induced hypertension    Type 1 diabetes mellitus (HCC)    Insulin dependent    Patient Active Problem List   Diagnosis Date Noted   Abnormal liver function 06/01/2017   Diabetic ketoacidosis (HCC) 12/25/2016   Transaminitis 12/24/2016   Essential hypertension 08/04/2015   Status post repeat low transverse cesarean section 06/25/2015   Intractable nausea and vomiting 02/10/2015     Marijuana use 01/08/2015   Dehydration with hyponatremia 11/16/2014   HLD (hyperlipidemia)    Type 2 diabetes mellitus with hyperglycemia (HCC)    DKA (diabetic ketoacidoses) (HCC) 09/10/2014    Past Surgical History:  Procedure Laterality Date   CESAREAN SECTION  2011   CESAREAN SECTION N/A 07/05/2012   Procedure: CESAREAN SECTION;  Surgeon: Catalina AntiguaPeggy Constant, MD;  Location: [facility];  Service: Obstetrics;  Laterality: N/A;   CESAREAN SECTION N/A 06/25/2015   Procedure: CESAREAN SECTION;  Surgeon: Catalina AntiguaPeggy Constant, MD;  Location: [facility];  Service: Obstetrics;  Laterality: N/A;   ESOPHAGOGASTRODUODENOSCOPY (EGD) WITH PROPOFOL Left 12/28/2016   Procedure: ESOPHAGOGASTRODUODENOSCOPY (EGD) WITH PROPOFOL;  Surgeon: Kerin SalenKarki, Arya, MD;  [facility];  Service: Gastroenterology;  Laterality: Left;     OB History    Gravida  3   Para  3   Term  3   Preterm  0   AB  0   Living  3     SAB  0   TAB  0   Ectopic  0   Multiple  0   Live Births  3           Family History  Problem Relation Age of Onset   Diabetes Mother    Kidney disease Father    Birth defects Son        unilateral renal agenesis  Social History   Tobacco Use   Smoking status: Former Smoker    Packs/day: 0.10    Years: 17.00    Pack years: 1.70    Types: Cigarettes   Smokeless tobacco: Never Used   Tobacco comment: 06/02/2017 "quit ~ 6 months ago"  Vaping Use   Vaping Use: Never used  Substance Use Topics   Alcohol use: No   Drug use: No    Home Medications Prior to Admission medications   Medication Sig Start Date End Date Taking? Authorizing Provider  acetaminophen (TYLENOL) 500 MG tablet Take 1,000 mg by mouth every 6 (six) hours as needed for mild pain.    [provider]  insulin NPH-regular Human (NOVOLIN 70/30) (70-30) 100 UNIT/ML injection Inject 13 Units into skin two times daily with a meal Patient taking differently: Inject 13 Units into the skin 3  (three) times daily. Inject 13 Units into skin two times daily with a meal 06/06/17   Briant Cedar, MD  lisinopril (PRINIVIL,ZESTRIL) 5 MG tablet Take 1 tablet (5 mg total) by mouth daily. 08/17/17   Zigmund Daniel., MD  omeprazole (PRILOSEC) 20 MG capsule Take 1 capsule (20 mg total) by mouth daily. 08/17/17 09/16/17  Zigmund Daniel., MD  polyethylene glycol [facility]) packet Take 17 g by mouth daily. 08/17/17   Zigmund Daniel., MD  promethazine (PHENERGAN) 12.5 MG tablet Take 1 tablet (12.5 mg total) by mouth every 6 (six) hours as needed for up to 20 doses for nausea or vomiting. 08/18/17   Zigmund Daniel., MD    Allergies    Patient has no known allergies.  Review of Systems   Review of Systems  Constitutional: Negative for appetite change, chills and fever.  HENT: Negative for ear pain, rhinorrhea, sneezing and sore throat.   Eyes: Negative for photophobia and visual disturbance.  Respiratory: Negative for cough, chest tightness, shortness of breath and wheezing.   Cardiovascular: Negative for chest pain and palpitations.  Gastrointestinal: Positive for abdominal pain, nausea and vomiting. Negative for blood in stool, constipation and diarrhea.  Genitourinary: Negative for dysuria, hematuria and urgency.  Musculoskeletal: Negative for myalgias.  Skin: Positive for wound. Negative for rash.  Neurological: Negative for dizziness, weakness and light-headedness.    Physical Exam Updated Vital Signs BP 130/77 (BP Location: Right Wrist)    Pulse (!) 102    Temp 98.6 F (37 C) (Oral)    Resp 16    Ht 5\' 2"  (1.575 m)    Wt 71.8 kg    LMP 10/03/2019 Comment: negative u-preg today   SpO2 100%    BMI 28.95 kg/m   Physical Exam Vitals and nursing note reviewed. Exam conducted with a chaperone present.  Constitutional:      General: She is not in acute distress.    Appearance: She is well-developed.  HENT:     Head: Normocephalic and atraumatic.     Nose:  Nose normal.  Eyes:     General: No scleral icterus.       Left eye: No discharge.     Conjunctiva/sclera: Conjunctivae normal.  Cardiovascular:     Rate and Rhythm: Regular rhythm. Tachycardia present.     Heart sounds: Normal heart sounds. No murmur heard.  No friction rub. No gallop.   Pulmonary:     Effort: Pulmonary effort is normal. No respiratory distress.     Breath sounds: Normal breath sounds.  Abdominal:     General:  Bowel sounds are normal. There is no distension.     Palpations: Abdomen is soft.     Tenderness: There is abdominal tenderness in the right lower quadrant. There is no guarding.  Genitourinary:    Comments: Large area of induration noted to the right inner buttock. Surrounding TTP. No drainage. Musculoskeletal:        General: Normal range of motion.     Cervical back: Normal range of motion and neck supple.  Skin:    General: Skin is warm and dry.     Findings: No rash.  Neurological:     Mental Status: She is alert.     Motor: No abnormal muscle tone.     Coordination: Coordination normal.     ED Results / Procedures / Treatments   Labs (all labs ordered are listed, but only abnormal results are displayed) Labs Reviewed  COMPREHENSIVE METABOLIC PANEL - Abnormal; Notable for the following components:      Result Value   Sodium 133 (*)    CO2 16 (*)    Glucose, Bld 346 (*)    AST 54 (*)    ALT 111 (*)    Alkaline Phosphatase 223 (*)    Anion gap 16 (*)    All other components within normal limits  CBC WITH DIFFERENTIAL/PLATELET - Abnormal; Notable for the following components:   WBC 12.9 (*)    Neutro Abs 10.7 (*)    Abs Immature Granulocytes 0.08 (*)    All other components within normal limits  URINALYSIS, ROUTINE W REFLEX MICROSCOPIC - Abnormal; Notable for the following components:   Specific Gravity, Urine >1.030 (*)    Glucose, UA >=500 (*)    Hgb urine dipstick TRACE (*)    Ketones, ur >80 (*)    All other components within normal  limits  URINALYSIS, MICROSCOPIC (REFLEX) - Abnormal; Notable for the following components:   Bacteria, UA MANY (*)    All other components within normal limits  CBG MONITORING, ED - Abnormal; Notable for the following components:   Glucose-Capillary 338 (*)    All other components within normal limits  CULTURE, BLOOD (ROUTINE X 2)  CULTURE, BLOOD (ROUTINE X 2)  SARS CORONAVIRUS 2 BY RT PCR (HOSPITAL ORDER, PERFORMED IN [facility])  PREGNANCY, URINE  BASIC METABOLIC PANEL  BASIC METABOLIC PANEL  BASIC METABOLIC PANEL  BASIC METABOLIC PANEL  BETA-HYDROXYBUTYRIC ACID  BETA-HYDROXYBUTYRIC ACID  CBG MONITORING, ED  I-STAT VENOUS BLOOD GAS, ED    EKG None  Radiology CT ABDOMEN PELVIS W CONTRAST  Result Date: 11/08/2019 CLINICAL DATA:  Abscess to buttocks for 2 days. Right lower quadrant pain for 3 days. Nausea. EXAM: CT ABDOMEN AND PELVIS WITH CONTRAST TECHNIQUE: Multidetector CT imaging of the abdomen and pelvis was performed using the standard protocol following bolus administration of intravenous contrast. CONTRAST:  OMNIPAQUE IOHEXOL 300 MG/ML  SOLN COMPARISON:  06/03/2017 FINDINGS: Lower chest: Unremarkable Hepatobiliary: No suspicious focal abnormality within the liver parenchyma. Small area of low attenuation in the anterior liver, adjacent to the falciform ligament, is in a characteristic location for focal fatty deposition. There is no evidence for gallstones, gallbladder wall thickening, or pericholecystic fluid. No intrahepatic or extrahepatic biliary dilation. Pancreas: No focal mass lesion. No dilatation of the main duct. No intraparenchymal cyst. No peripancreatic edema. Spleen: No splenomegaly. No focal mass lesion. Adrenals/Urinary Tract: No adrenal nodule or mass. No evidence for hydroureter. The urinary bladder appears normal for the degree of distention. Stomach/Bowel:  Stomach is unremarkable. No gastric wall thickening. No evidence of outlet obstruction.  Duodenum is normally positioned as is the ligament of Treitz. No small bowel wall thickening. No small bowel dilatation. The terminal ileum is normal. The appendix is normal. No gross colonic mass. No colonic wall thickening. There is perianal edema/inflammation with a linear band of soft tissue tracking from the right perianal region through the subcutaneous fat towards the right skin of the intergluteal fold, similar to prior study. Associated subcutaneous edema/fluid is progressive in the interval. Vascular/Lymphatic: There is abdominal aortic atherosclerosis without aneurysm. There is no gastrohepatic or hepatoduodenal ligament lymphadenopathy. No retroperitoneal or mesenteric lymphadenopathy. No pelvic sidewall lymphadenopathy. Interval development of lymphadenopathy in the right groin with 1.4 cm short axis lymph node visible on image 81/2. Reproductive: The uterus is unremarkable.  There is no adnexal mass. Other: No intraperitoneal free fluid. Musculoskeletal: No worrisome lytic or sclerotic osseous abnormality. Sclerotic foci in the sacrum are probably bone islands, stable in the interval. Lipoma in the right lower lateral abdominal wall musculature is probably in the plane between the transversus abdominus and the internal oblique, stable since prior. IMPRESSION: 1. Similar appearance of the perianal edema/inflammation with a linear band of soft tissue tracking from the right perianal region through the subcutaneous fat towards the right skin of the intergluteal fold. As this was present previously and may reflect scarring. Associated subcutaneous edema/fluid is progressive in the interval. Perianal fistula not excluded. Cellulitis cannot be excluded. No evidence for drainable abscess at this time. 2. Interval development of right inguinal lymphadenopathy, likely reactive. Follow-up CT in 3 months could be used to ensure resolution. 3. Aortic Atherosclerosis (ICD10-I70.0). Electronically Signed   By: Kennith Center M.D.   On: 11/08/2019 12:59    Procedures .Critical Care Performed by: Dietrich Pates, PA-C Authorized by: Dietrich Pates, PA-C   Critical care provider statement:    Critical care time (minutes):  35   Critical care was necessary to treat or prevent imminent or life-threatening deterioration of the following conditions:  Circulatory failure, endocrine crisis, metabolic crisis and sepsis   Critical care was time spent personally by me on the following activities:  Development of treatment plan with patient or surrogate, discussions with consultants, evaluation of patient's response to treatment, examination of patient, ordering and review of laboratory studies, pulse oximetry, re-evaluation of patient's condition, review of old charts, ordering and review of radiographic studies and obtaining history from patient or surrogate   I assumed direction of critical care for this patient from another provider in my specialty: no     (including critical care time)  Medications Ordered in ED Medications  ceFAZolin (ANCEF) IVPB 1 g/50 mL premix (has no administration in time range)  insulin regular, human (MYXREDLIN) 100 units/ 100 mL infusion (has no administration in time range)  dextrose 5 %-0.45 % sodium chloride infusion (has no administration in time range)  dextrose 50 % solution 0-50 mL (has no administration in time range)  potassium chloride 10 mEq in 100 mL IVPB (has no administration in time range)  0.9 %  sodium chloride infusion (has no administration in time range)  sodium chloride 0.9 % bolus 1,000 mL ( Intravenous Stopped 11/08/19 1314)  morphine 4 MG/ML injection 4 mg (4 mg Intravenous Given 11/08/19 1251)  ondansetron (ZOFRAN) injection 4 mg (4 mg Intravenous Given 11/08/19 1250)  iohexol (OMNIPAQUE) 300 MG/ML solution 100 mL (100 mLs Intravenous Contrast Given 11/08/19 1235)  sodium chloride 0.9 % bolus 1,000  mL (1,000 mLs Intravenous New Bag/Given 11/08/19 1314)    ED Course    I have reviewed the triage vital signs and the nursing notes.  Pertinent labs & imaging results that were available during my care of the patient were reviewed by me and considered in my medical decision making (see chart for details).  Clinical Course as of Nov 07 1349  Thu Nov 08, 2019  1234 CO2(!): 16 [HK]  1234 Anion gap(!): 16 [HK]  1234 Bacteria, UA(!): MANY [HK]  1234 Budding Yeast: PRESENT [HK]  1235 Ketones, ur(!): >80 [HK]  1235 Glucose-Capillary(!): 338 [HK]    Clinical Course User Index [HK] Dietrich Pates, PA-C   MDM Rules/Calculators/A&P                          39 year old female with a past medical history of IDDM, hypertension presenting to the ED with abdominal pain and abscess.  Reports abscess to right buttock 3 days ago that she noticed.  Reports some drainage from the area after applying peroxide and Neosporin to it.  States that discomfort is radiating to her right hip and right lower quadrant.  She also complains of abdominal pain which began after she drank apple cider vinegar and attempt to lower her blood pressure after she read on Facebook.  Denies any vaginal complaints or fevers.  On exam there is an area of induration noted to the right inner buttock without drainage noted.  It is tender to palpation.  No fluctuance noted.  Work-up here significant for anion gap of 16, CO2 of 16, greater than 80 ketones in her urine and CBG of 338.  CT of the abdomen pelvis shows findings consistent with cellulitis without drainable abscess.  We will begin her on IV antibiotics, insulin drip, IV fluids for her DKA in the setting of cellulitis.  Patient is agreeable to admission.   Portions of this note were generated with Scientist, clinical (histocompatibility and immunogenetics). Dictation errors may occur despite best attempts at proofreading.  Final Clinical Impression(s) / ED Diagnoses Final diagnoses:  Cellulitis of buttock  Diabetic ketoacidosis without coma associated with type 1 diabetes mellitus  [facility])    Rx / DC Orders ED Discharge Orders    None       Dietrich Pates, PA-C 11/08/19 1352    Pollyann Savoy, MD 11/08/19 1452

## 2019-11-09 LAB — BASIC METABOLIC PANEL
Anion gap: 9 (ref 5–15)
Anion gap: 8 (ref 5–15)
Anion gap: 8 (ref 5–15)
Anion gap: 14 (ref 5–15)
BUN: 6 mg/dL (ref 6–20)
BUN: 7 mg/dL (ref 6–20)
BUN: 7 mg/dL (ref 6–20)
BUN: 7 mg/dL (ref 6–20)
CO2: 20 mmol/L — ABNORMAL LOW (ref 22–32)
CO2: 20 mmol/L — ABNORMAL LOW (ref 22–32)
CO2: 22 mmol/L (ref 22–32)
CO2: 21 mmol/L — ABNORMAL LOW (ref 22–32)
Calcium: 8.1 mg/dL — ABNORMAL LOW (ref 8.9–10.3)
Calcium: 8.3 mg/dL — ABNORMAL LOW (ref 8.9–10.3)
Calcium: 8.3 mg/dL — ABNORMAL LOW (ref 8.9–10.3)
Calcium: 9 mg/dL (ref 8.9–10.3)
Chloride: 106 mmol/L (ref 98–111)
Chloride: 105 mmol/L (ref 98–111)
Chloride: 106 mmol/L (ref 98–111)
Chloride: 102 mmol/L (ref 98–111)
Creatinine, Ser: 0.46 mg/dL (ref 0.44–1.00)
Creatinine, Ser: 0.4 mg/dL — ABNORMAL LOW (ref 0.44–1.00)
Creatinine, Ser: 0.38 mg/dL — ABNORMAL LOW (ref 0.44–1.00)
Creatinine, Ser: 0.49 mg/dL (ref 0.44–1.00)
GFR calc Af Amer: >60 mL/min (ref >60)
GFR calc Af Amer: >60 mL/min (ref >60)
GFR calc Af Amer: >60 mL/min (ref >60)
GFR calc Af Amer: >60 mL/min (ref >60)
GFR calc non Af Amer: >60 mL/min (ref >60)
GFR calc non Af Amer: >60 mL/min (ref >60)
GFR calc non Af Amer: >60 mL/min (ref >60)
GFR calc non Af Amer: >60 mL/min (ref >60)
Glucose, Bld: 218 mg/dL — ABNORMAL HIGH (ref 70–99)
Glucose, Bld: 211 mg/dL — ABNORMAL HIGH (ref 70–99)
Glucose, Bld: 264 mg/dL — ABNORMAL HIGH (ref 70–99)
Glucose, Bld: 352 mg/dL — ABNORMAL HIGH (ref 70–99)
Potassium: 3.4 mmol/L — ABNORMAL LOW (ref 3.5–5.1)
Potassium: 3.5 mmol/L (ref 3.5–5.1)
Potassium: 3.8 mmol/L (ref 3.5–5.1)
Potassium: 4.3 mmol/L (ref 3.5–5.1)
Sodium: 135 mmol/L (ref 135–145)
Sodium: 133 mmol/L — ABNORMAL LOW (ref 135–145)
Sodium: 136 mmol/L (ref 135–145)
Sodium: 137 mmol/L (ref 135–145)

## 2019-11-09 LAB — BETA-HYDROXYBUTYRIC ACID
Beta-Hydroxybutyric Acid: 0.62 mmol/L — ABNORMAL HIGH (ref 0.05–0.27)
Beta-Hydroxybutyric Acid: 0.93 mmol/L — ABNORMAL HIGH (ref 0.05–0.27)
Beta-Hydroxybutyric Acid: 0.22 mmol/L (ref 0.05–0.27)

## 2019-11-09 LAB — GLUCOSE, CAPILLARY
Glucose-Capillary: 122 mg/dL — ABNORMAL HIGH (ref 70–99)
Glucose-Capillary: 204 mg/dL — ABNORMAL HIGH (ref 70–99)
Glucose-Capillary: 292 mg/dL — ABNORMAL HIGH (ref 70–99)
Glucose-Capillary: 259 mg/dL — ABNORMAL HIGH (ref 70–99)
Glucose-Capillary: 187 mg/dL — ABNORMAL HIGH (ref 70–99)
Glucose-Capillary: 218 mg/dL — ABNORMAL HIGH (ref 70–99)
Glucose-Capillary: 294 mg/dL — ABNORMAL HIGH (ref 70–99)
Glucose-Capillary: 224 mg/dL — ABNORMAL HIGH (ref 70–99)

## 2019-11-09 LAB — HEPATITIS PANEL, ACUTE
HCV Ab: NONREACTIVE
Hep A IgM: NONREACTIVE
Hep B C IgM: NONREACTIVE
Hepatitis B Surface Ag: NONREACTIVE

## 2019-11-09 LAB — HIV ANTIBODY (ROUTINE TESTING W REFLEX): HIV Screen 4th Generation wRfx: NONREACTIVE

## 2019-11-09 MED ORDER — ACETAMINOPHEN 325 MG PO TABS
650.0000 mg | ORAL_TABLET | Freq: Four times a day (QID) | ORAL | Status: DC | PRN
  Administered 2019-11-09: 650 mg via ORAL
  Filled 2019-11-09: qty 2

## 2019-11-09 MED ORDER — INSULIN ASPART 100 UNIT/ML ~~LOC~~ SOLN
0.0000 [IU] | Freq: Every day | SUBCUTANEOUS | Status: DC
  Administered 2019-11-09: 2 [IU] via SUBCUTANEOUS
  Administered 2019-11-10: 3 [IU] via SUBCUTANEOUS

## 2019-11-09 MED ORDER — CHLORHEXIDINE GLUCONATE CLOTH 2 % EX PADS
6.0000 | MEDICATED_PAD | Freq: Every day | CUTANEOUS | Status: DC
  Administered 2019-11-09 – 2019-11-10 (×2): 6 via TOPICAL

## 2019-11-09 MED ORDER — VANCOMYCIN HCL IN DEXTROSE 1-5 GM/200ML-% IV SOLN
1000.0000 mg | Freq: Two times a day (BID) | INTRAVENOUS | Status: DC
  Administered 2019-11-09 – 2019-11-11 (×5): 1000 mg via INTRAVENOUS
  Filled 2019-11-09 (×5): qty 200

## 2019-11-09 MED ORDER — INSULIN ASPART 100 UNIT/ML ~~LOC~~ SOLN
6.0000 [IU] | Freq: Once | SUBCUTANEOUS | Status: AC
  Administered 2019-11-09: 6 [IU] via SUBCUTANEOUS

## 2019-11-09 MED ORDER — SODIUM CHLORIDE 0.9 % IV SOLN
2.0000 g | Freq: Three times a day (TID) | INTRAVENOUS | Status: DC
  Administered 2019-11-09 (×2): 2 g via INTRAVENOUS
  Filled 2019-11-09 (×2): qty 2

## 2019-11-09 MED ORDER — INSULIN ASPART PROT & ASPART (70-30 MIX) 100 UNIT/ML ~~LOC~~ SUSP
13.0000 [IU] | Freq: Two times a day (BID) | SUBCUTANEOUS | Status: DC
  Administered 2019-11-09: 13 [IU] via SUBCUTANEOUS
  Filled 2019-11-09: qty 10

## 2019-11-09 MED ORDER — INSULIN ASPART PROT & ASPART (70-30 MIX) 100 UNIT/ML ~~LOC~~ SUSP: 7.0000 [IU] | Freq: Two times a day (BID) | SUBCUTANEOUS | Status: DC

## 2019-11-09 MED ORDER — INSULIN GLARGINE 100 UNIT/ML ~~LOC~~ SOLN
10.0000 [IU] | Freq: Every day | SUBCUTANEOUS | Status: DC
  Administered 2019-11-09: 10 [IU] via SUBCUTANEOUS
  Filled 2019-11-09: qty 0.1

## 2019-11-09 MED ORDER — INSULIN ASPART 100 UNIT/ML ~~LOC~~ SOLN
0.0000 [IU] | Freq: Three times a day (TID) | SUBCUTANEOUS | Status: DC
  Administered 2019-11-09: 3 [IU] via SUBCUTANEOUS
  Administered 2019-11-09: 5 [IU] via SUBCUTANEOUS
  Administered 2019-11-09: 2 [IU] via SUBCUTANEOUS
  Administered 2019-11-10: 3 [IU] via SUBCUTANEOUS
  Administered 2019-11-10: 2 [IU] via SUBCUTANEOUS
  Administered 2019-11-10: 5 [IU] via SUBCUTANEOUS
  Administered 2019-11-11 (×3): 3 [IU] via SUBCUTANEOUS

## 2019-11-09 MED ORDER — MORPHINE SULFATE (PF) 2 MG/ML IV SOLN
2.0000 mg | INTRAVENOUS | Status: DC | PRN
  Administered 2019-11-09 – 2019-11-10 (×4): 2 mg via INTRAVENOUS
  Filled 2019-11-09 (×4): qty 1

## 2019-11-09 MED ORDER — POTASSIUM CHLORIDE CRYS ER 20 MEQ PO TBCR
20.0000 meq | EXTENDED_RELEASE_TABLET | Freq: Once | ORAL | Status: AC
  Administered 2019-11-09: 20 meq via ORAL
  Filled 2019-11-09: qty 1

## 2019-11-09 MED ORDER — INSULIN NPH (HUMAN) (ISOPHANE) 100 UNIT/ML ~~LOC~~ SUSP: 10.0000 [IU] | Freq: Two times a day (BID) | SUBCUTANEOUS | Status: DC

## 2019-11-09 MED ORDER — OXYCODONE HCL 5 MG PO TABS
5.0000 mg | ORAL_TABLET | ORAL | Status: DC | PRN
  Administered 2019-11-10 – 2019-11-11 (×4): 5 mg via ORAL
  Filled 2019-11-09 (×4): qty 1

## 2019-11-09 MED ORDER — SODIUM CHLORIDE 0.9 % IV SOLN
1.0000 g | INTRAVENOUS | Status: DC
  Administered 2019-11-09 – 2019-11-10 (×2): 1 g via INTRAVENOUS
  Filled 2019-11-09 (×3): qty 10
  Filled 2019-11-09: qty 1

## 2019-11-09 MED ORDER — IBUPROFEN 400 MG PO TABS
400.0000 mg | ORAL_TABLET | Freq: Four times a day (QID) | ORAL | Status: DC | PRN
  Administered 2019-11-09: 400 mg via ORAL
  Filled 2019-11-09: qty 2

## 2019-11-09 NOTE — Discharge Instructions (Signed)
Glucose Products:  ReliOn™ glucose products raise low blood sugar fast. Tablets are free of fat, caffeine, sodium and gluten. They are portable and easy to carry, making it easier for people with diabetes to BE PREPARED for lows. ° °Glucose Tablets °Available in 6 flavors °• 10 ct...................................... $1.00 °• 50 ct...................................... $3.98 °Glucose Shot..................................$1.48 °Glucose Gel....................................$3.44 ° °Alcohol Swabs °Alcohol swabs are used to sterilize your injection site. All of our swabs are individually wrapped for maximum safety, convenience and moisture retention. °ReliOn™ Alcohol Swabs °• 100 ct Swabs..............................$1.00 °• 400 ct Swabs..............................$3.74 ° °Lancets °ReliOn™ offers three lancet options conveniently designed to work with almost every lancing device. Each features a protective disk, which guarantees sterility before testing. °ReliOn™ Lancets °• 100 ct Lancets $1.56 °• 200 ct Lancets $2.64 °Available in Ultra-Thin, Thin & Micro-Thin °ReliOn™ 2-IN-1 Lancing Device °• 50 ct Lancets..................................... $3.44 °Available in 30 gauge and 25 gauge °ReliOn™ Lancing Device....................$5.84 ° °Blood Glucose Monitors °ReliOn™ offers a full range of blood glucose testing options to provide an accurate, affordable °system that meets each person's unique needs and preferences. °Prime Meter....................................... $9.00 °Prime Test Strips °• 25 test strips.................................... $5.00 °• 50 test strips.................................... $9.00 °• 100 test strips.................................$17.88 °Premier BLU Meter  ………………………………  $18.98 °Premier Voice Meter  ……………………………….  $14.98 °Premier Test Strips °• 50 test strips.................................... $9.00 °• 100 test strips.................................$17.88 °Premier Compact Meter Kit   ………………………………  $19.44 °Kit includes: °• 50 test strips °• 10 lancets °• Lancing device °• Carry case ° °Ketone Test Strips °• 50 test strips  ……………………………………..  $6.64 ° °Human Insulin  °Novolin®/ReliOn™ (recombinant DNA origin) is manufactured for [employer] by Novo Nordisk °Novolin®/ReliOn™ Insulin* with Vial..........$24.88 °Available in N, R, 70/30 °Novolin®/ReliOn™ Insulin Pens*  ……………  $42.88 °Available in N, R, 70/30 ° °Insulin Delivery °ReliOn™ syringes and pen needles provide precision technology, comfort and accuracy in °insulin delivery at affordable prices. °ReliOn™ Pen Needles* °• 50 ct....................................................$9.00 °Available in 4mm, 6mm, 8mm & 12mm °ReliOn™ Insulin Syringes*  °• 100 ct ……………………………. $12.58 °Available in 29G, 30G & 31G (3/10cc, 1/2cc & 1cc units) °

## 2019-11-09 NOTE — Progress Notes (Signed)
Pharmacy Antibiotic Note  Cheryl Parker is a 39 y.o. female admitted on 11/08/2019 with cellulitis.  Pharmacy has been consulted for Vancomycin, cefepime dosing.  Plan: Vancomycin 1.5gm iv x1, then Vancomycin 1750 mg IV Q 24 hrs. Goal AUC 400-550. Expected AUC: 510 SCr used: 0.8 (adjusted) Nomogram vancomycin 1gm iv q12hr (70-80kg; 60-80 mL/min) Vancomycin trough goal 15-20  Cefepime 2gm iv x1, then 2gm iv q8hr   Height: 5\' 2"  (157.5 cm) Weight: 71.8 kg (158 lb 4.8 oz) IBW/kg (Calculated) : 50.1  Temp (24hrs), Avg:98 F (36.7 C), Min:97.7 F (36.5 C), Max:98.6 F (37 C)  Recent Labs  Lab 11/08/19 1152 11/08/19 1152 11/08/19 1428 11/08/19 1745 11/08/19 2202 11/08/19 2237 11/09/19 0255  WBC 12.9*  --   --   --   --  11.1*  --   CREATININE 0.57   < > 0.49 0.38* 0.37* 0.36* 0.46   < > = values in this interval not displayed.    Estimated Creatinine Clearance: 87.6 mL/min (by C-G formula based on SCr of 0.46 mg/dL).    No Known Allergies  Antimicrobials this admission: Vancomycin 11/08/2019 >> Cefepime 11/08/2019 >>  Dose adjustments this admission: -  Microbiology results: -  Thank you for allowing pharmacy to be a part of this patient's care.  11/10/2019 [place] 11/09/2019 5:17 AM

## 2019-11-09 NOTE — Progress Notes (Signed)
PROGRESS NOTE    Cheryl Parker  KGY:[id number] DOB: 1981/02/03 DOA: 11/08/2019 PCP: Patient, No Pcp Per  Chief Complaint  Patient presents with  . Abdominal Pain  . Abscess   Brief Narrative:  Cheryl Parker is Cheryl Parker 39 y.o. female with medical history significant of type 1 diabetes with prior DKA, hypertension, GERD, who came to the ER essentially complaining of abscess of the right buttock that came in 3 days ago.  This has busted out with some drainage.  Patient tried Neosporin and peroxide on the area but appears not to have improved.  She also reported taking some apple cider with vinegar as well as water to lower her blood pressure based on which she is on [employer].  Since then she has been having abdominal pain and feels unwell.  In the ER patient was found to be in diabetic ketoacidosis.  Also noted to have the right buttocks cellulitis.  Based on imaging there was no drainable abscess.  She is being admitted for the management of the DKA but also cellulitis of the right buttocks.  No fever or chills at the moment.  She is unable to sit on the right buttock secondary to the pain..  ED Course: Temperature 98.6 blood pressure 140/97 pulse 120 respirate 25 oxygen sat 98% on room air.  White count 11.1 CO2 20 creatinine 0.36 and calcium 8.5 the rest of the lab work appeared to be within normal.  Initial chemistry showed an anion gap of 16.  Ketosis and evidence of diabetic ketoacidosis.  Patient was initiated on IV insulin drip and admitted to the hospital for treatment.  Assessment & Plan:   Principal Problem:   DKA (diabetic ketoacidoses) (HCC) Active Problems:   HLD (hyperlipidemia)   Essential hypertension  #1 diabetic ketoacidosis:  Resolved. Resume home 70/30 13 units BID (of note, she takes this TID at home 13-20 units) Likely 2/2 infection below Diabetes coordinator AG normalized, normal CO2 Improved, continue to monitor BG's  #2 right buttock abscess:  No drainable abscess  on imaging  Continue vanc/ceftriaxone for now CT with possible perianal fistula (will discuss with surgery) - no evidence of drainable abscess  # Right Inguinal Lymphadenopathy: needs follow up CT in 3 months to ensure resolutation  #3 hypertension: Blood pressure appears controlled.  Use home regimen.  #4 hyperlipidemia: Continue statin  # Elevated LFT's: acute hepatitis panel, no suspicious focal abnormality within liver parenchyma noted on imaging, follow  DVT prophylaxis: lovenox Code Status: full  Family Communication: none at bedside Disposition:   Status is: Inpatient  Remains inpatient appropriate because:Inpatient level of care appropriate due to severity of illness   Dispo: The patient is from: Home              Anticipated d/c is to: Home              Anticipated d/c date is: 2 days              Patient currently is not medically stable to d/c.  Consultants:   none  Procedures: none  Antimicrobials:  Anti-infectives (From admission, onward)   Start     Dose/Rate Route Frequency Ordered Stop   11/09/19 2200  cefTRIAXone (ROCEPHIN) 1 g in sodium chloride 0.9 % 100 mL IVPB     Discontinue     1 g 200 mL/hr over 30 Minutes Intravenous Every 24 hours 11/09/19 1459     11/09/19 1200  vancomycin (VANCOCIN) IVPB 1000 mg/200 mL premix  Discontinue     1,000 mg 200 mL/hr over 60 Minutes Intravenous Every 12 hours 11/09/19 0516     11/09/19 0600  ceFEPIme (MAXIPIME) 2 g in sodium chloride 0.9 % 100 mL IVPB  Status:  Discontinued        2 g 200 mL/hr over 30 Minutes Intravenous Every 8 hours 11/09/19 0516 11/09/19 1459   11/08/19 2245  vancomycin (VANCOREADY) IVPB 1500 mg/300 mL        1,500 mg 150 mL/hr over 120 Minutes Intravenous  Once 11/08/19 2231 11/09/19 0157   11/08/19 2245  ceFEPIme (MAXIPIME) 2 g in sodium chloride 0.9 % 100 mL IVPB        2 g 200 mL/hr over 30 Minutes Intravenous  Once 11/08/19 2231 11/08/19 2333   11/08/19 1330  ceFAZolin (ANCEF)  IVPB 1 g/50 mL premix        1 g 100 mL/hr over 30 Minutes Intravenous  Once 11/08/19 1318 11/08/19 1432      Subjective: C/o buttocks pain  Objective: Vitals:   11/09/19 1000 11/09/19 1100 11/09/19 1200 11/09/19 1300  BP: 112/65 118/76 120/80 127/86  Pulse: 90 92 97 (!) 102  Resp: 20 (!) 21 20 (!) 21  Temp:      TempSrc:      SpO2: 98% 100% 100% 100%  Weight:      Height:        Intake/Output Summary (Last 24 hours) at 11/09/2019 1440 Last data filed at 11/09/2019 1341 Gross per 24 hour  Intake 2191.5 ml  Output 100 ml  Net 2091.5 ml   Filed Weights   11/08/19 1051  Weight: 71.8 kg    Examination:  General exam: Appears calm and comfortable  Respiratory system: Clear to auscultation. Respiratory effort normal. Cardiovascular system: S1 & S2 heard, RRR.  Gastrointestinal system: Abdomen is nondistended, soft and nontender. Buttocks examined with RN present, ~3 cm area of induration with overlying scab, tender, without fluctuance Central nervous system: Alert and oriented. No focal neurological deficits. Extremities: no LEE Skin: No rashes, lesions or ulcers Psychiatry: Judgement and insight appear normal. Mood & affect appropriate.     Data Reviewed: I have personally reviewed following labs and imaging studies  CBC: Recent Labs  Lab 11/08/19 1152 11/08/19 1422 11/08/19 2237  WBC 12.9*  --  11.1*  NEUTROABS 10.7*  --   --   HGB 14.0 14.3 12.2  HCT 43.1 42.0 37.4  MCV 96.2  --  96.6  PLT 374  --  343    Basic Metabolic Panel: Recent Labs  Lab 11/08/19 2202 11/08/19 2237 11/09/19 0255 11/09/19 0559 11/09/19 1018  NA 136 137 135 133* 136  K 3.5 3.5 3.4* 3.5 3.8  CL 105 105 106 105 106  CO2 20* 20* 20* 20* 22  GLUCOSE 185* 165* 218* 211* 264*  BUN 7 6 6 7 7   CREATININE 0.37* 0.36* 0.46 0.40* 0.38*  CALCIUM 8.5* 8.5* 8.1* 8.3* 8.3*    GFR: Estimated Creatinine Clearance: 87.6 mL/min (Cheryl Parker) (by C-G formula based on SCr of 0.38 mg/dL  (L)).  Liver Function Tests: Recent Labs  Lab 11/08/19 1152  AST 54*  ALT 111*  ALKPHOS 223*  BILITOT 0.9  PROT 7.7  ALBUMIN 3.8    CBG: Recent Labs  Lab 11/09/19 0225 11/09/19 0317 11/09/19 0358 11/09/19 0729 11/09/19 1200  GLUCAP 204* 292* 259* 187* 218*     Recent Results (from the past 240 hour(s))  Blood culture (routine x 2)  Status: None (Preliminary result)   Collection Time: 11/08/19  1:45 PM   Specimen: BLOOD  Result Value Ref Range Status   Specimen Description   Final    BLOOD BLOOD RIGHT FOREARM Performed at [facility], [address] [place], [place] [address]    Special Requests   Final    BOTTLES DRAWN AEROBIC AND ANAEROBIC Blood Culture adequate volume Performed at [facility], [address] [place], [place] [address]    Culture   Final    NO GROWTH < 24 HOURS Performed at [facility], [address] [place], [place] [address]    Report Status PENDING  Incomplete  SARS Coronavirus 2 by RT PCR (hospital order, performed in [facility] hospital lab) Nasopharyngeal Peripheral     Status: None   Collection Time: 11/08/19  1:54 PM   Specimen: Peripheral; Nasopharyngeal  Result Value Ref Range Status   SARS Coronavirus 2 NEGATIVE NEGATIVE Final    Comment: (NOTE) SARS-CoV-2 target nucleic acids are NOT DETECTED.  The SARS-CoV-2 RNA is generally detectable in upper and lower respiratory specimens during the acute phase of infection. The lowest concentration of SARS-CoV-2 viral copies this assay can detect is 250 copies / mL. Kymberlie Brazeau negative result does not preclude SARS-CoV-2 infection and should not be used as the sole basis for treatment or other patient management decisions.  Daziyah Cogan negative result may occur with improper specimen collection / handling, submission of specimen other than nasopharyngeal swab, presence of viral mutation(s) within the areas targeted by this assay, and inadequate number of viral  copies (<250 copies / mL). Bevan Vu negative result must be combined with clinical observations, patient history, and epidemiological information.  Fact Sheet for Patients:   BoilerBrush.com.cy  Fact Sheet for Healthcare Providers: https://pope.com/  This test is not yet approved or  cleared by the [place] FDA and has been authorized for detection and/or diagnosis of SARS-CoV-2 by FDA under an Emergency Use Authorization (EUA).  This EUA will remain in effect (meaning this test can be used) for the duration of the COVID-19 declaration under Section 564(b)(1) of the Act, 21 U.S.C. section 360bbb-3(b)(1), unless the authorization is terminated or revoked sooner.  Performed at [facility], [address] [place], [place] [address]   Blood culture (routine x 2)     Status: None (Preliminary result)   Collection Time: 11/08/19  2:15 PM   Specimen: BLOOD  Result Value Ref Range Status   Specimen Description   Final    BLOOD BLOOD LEFT FOREARM Performed at [facility], [address] [place], [place] [address]    Special Requests   Final    BOTTLES DRAWN AEROBIC AND ANAEROBIC Blood Culture results may not be optimal due to an inadequate volume of blood received in culture bottles Performed at [facility], [address] [place], [place] [address]    Culture   Final    NO GROWTH < 24 HOURS Performed at [facility], [address] [place], [place] [address]    Report Status PENDING  Incomplete  MRSA PCR Screening     Status: None   Collection Time: 11/08/19  9:46 PM   Specimen: Nasal Mucosa; Nasopharyngeal  Result Value Ref Range Status   MRSA by PCR NEGATIVE NEGATIVE Final    Comment:        The GeneXpert MRSA Assay (FDA approved for NASAL specimens  only), is one component of Maebelle Sulton comprehensive MRSA colonization surveillance program. It is not intended to diagnose MRSA infection nor to  guide or monitor treatment for MRSA infections. Performed at [facility], [address] [place], [place] [address]          Radiology Studies: CT ABDOMEN PELVIS W CONTRAST  Result Date: 11/08/2019 CLINICAL DATA:  Abscess to buttocks for 2 days. Right lower quadrant pain for 3 days. Nausea. EXAM: CT ABDOMEN AND PELVIS WITH CONTRAST TECHNIQUE: Multidetector CT imaging of the abdomen and pelvis was performed using the standard protocol following bolus administration of intravenous contrast. CONTRAST:  100mL OMNIPAQUE IOHEXOL 300 MG/ML  SOLN COMPARISON:  06/03/2017 FINDINGS: Lower chest: Unremarkable Hepatobiliary: No suspicious focal abnormality within the liver parenchyma. Small area of low attenuation in the anterior liver, adjacent to the falciform ligament, is in Harlean Regula characteristic location for focal fatty deposition. There is no evidence for gallstones, gallbladder wall thickening, or pericholecystic fluid. No intrahepatic or extrahepatic biliary dilation. Pancreas: No focal mass lesion. No dilatation of the main duct. No intraparenchymal cyst. No peripancreatic edema. Spleen: No splenomegaly. No focal mass lesion. Adrenals/Urinary Tract: No adrenal nodule or mass. No evidence for hydroureter. The urinary bladder appears normal for the degree of distention. Stomach/Bowel: Stomach is unremarkable. No gastric wall thickening. No evidence of outlet obstruction. Duodenum is normally positioned as is the ligament of Treitz. No small bowel wall thickening. No small bowel dilatation. The terminal ileum is normal. The appendix is normal. No gross colonic mass. No colonic wall thickening. There is perianal edema/inflammation with Lauralei Clouse linear band of soft tissue tracking from the right perianal region through the subcutaneous fat towards the right skin of the intergluteal fold, similar to prior study. Associated subcutaneous edema/fluid is progressive in the interval. Vascular/Lymphatic: There  is abdominal aortic atherosclerosis without aneurysm. There is no gastrohepatic or hepatoduodenal ligament lymphadenopathy. No retroperitoneal or mesenteric lymphadenopathy. No pelvic sidewall lymphadenopathy. Interval development of lymphadenopathy in the right groin with 1.4 cm short axis lymph node visible on image 81/2. Reproductive: The uterus is unremarkable.  There is no adnexal mass. Other: No intraperitoneal free fluid. Musculoskeletal: No worrisome lytic or sclerotic osseous abnormality. Sclerotic foci in the sacrum are probably bone islands, stable in the interval. Lipoma in the right lower lateral abdominal wall musculature is probably in the plane between the transversus abdominus and the internal oblique, stable since prior. IMPRESSION: 1. Similar appearance of the perianal edema/inflammation with Destenee Guerry linear band of soft tissue tracking from the right perianal region through the subcutaneous fat towards the right skin of the intergluteal fold. As this was present previously and may reflect scarring. Associated subcutaneous edema/fluid is progressive in the interval. Perianal fistula not excluded. Cellulitis cannot be excluded. No evidence for drainable abscess at this time. 2. Interval development of right inguinal lymphadenopathy, likely reactive. Follow-up CT in 3 months could be used to ensure resolution. 3. Aortic Atherosclerosis (ICD10-I70.0). Electronically Signed   By: Kennith CenterEric  Mansell M.D.   On: 11/08/2019 12:59        Scheduled Meds: . Chlorhexidine Gluconate Cloth  6 each Topical Daily  . enoxaparin (LOVENOX) injection  40 mg Subcutaneous QHS  . insulin aspart  0-5 Units Subcutaneous QHS  . insulin aspart  0-9 Units Subcutaneous TID WC  . insulin glargine  10 Units Subcutaneous QHS   Continuous Infusions: . sodium chloride 250 mL (11/08/19 2315)  . ceFEPime (MAXIPIME) IV 200 mL/hr at 11/09/19 1341  . vancomycin Stopped (  11/09/19 1309)     LOS: 1 day    Time spent: over 30  min    Lacretia Nicks, MD [facility]   To contact the attending provider between 7A-7P or the covering provider during after hours 7P-7A, please log into the web site [email or web address] and access using universal [place] password for that web site. If you do not have the password, please call the hospital operator.  11/09/2019, 2:40 PM

## 2019-11-09 NOTE — Progress Notes (Addendum)
Inpatient Diabetes Program Recommendations  AACE/ADA: New Consensus Statement on Inpatient Glycemic Control (2015)  Target Ranges:  Prepandial:   less than 140 mg/dL      Peak postprandial:   less than 180 mg/dL (1-2 hours)      Critically ill patients:  140 - 180 mg/dL   Lab Results  Component Value Date   GLUCAP 187 (H) 11/09/2019   HGBA1C 10.7 (H) 11/08/2019    Review of Glycemic Control  Diabetes history: type 2? Outpatient Diabetes medications: Novolin 70/30 insulin 13-26 units TID Current orders for Inpatient glycemic control: Lantus 10 units daily, Novolog SENSITIVE correction scale TID & HS scale  Inpatient Diabetes Program Recommendations:   Spoke with patient at the bedside. States she has been taking Novolin [employer] insulin 70/30 13-26 units TID at home. States that she just bought more insulin in vial and it was over $100. States that [employer] told her that it had gone up in price. Not aware of change in cost at [facility]. Attached [employer] insulin and supplies list (2019) to AVS.   Patient states that she had not taken her insulin the last few days since she was hurting so badly, not eating. Had been checking her blood sugars at least X2 per day since she had been having pain.   Patient states that she does not have a PCP. Will have case manager check to see if patient can get an appointment at [facility] for follow up. Encouraged patient to see physician for better blood glucose control.   Smith Mince RN BSN CDE Diabetes Coordinator Pager: [phone]  8am-5pm

## 2019-11-10 LAB — COMPREHENSIVE METABOLIC PANEL
ALT: 59 U/L — ABNORMAL HIGH (ref 0–44)
AST: 40 U/L (ref 15–41)
Albumin: 2.9 g/dL — ABNORMAL LOW (ref 3.5–5.0)
Alkaline Phosphatase: 143 U/L — ABNORMAL HIGH (ref 38–126)
Anion gap: 10 (ref 5–15)
BUN: 8 mg/dL (ref 6–20)
CO2: 22 mmol/L (ref 22–32)
Calcium: 8.7 mg/dL — ABNORMAL LOW (ref 8.9–10.3)
Chloride: 101 mmol/L (ref 98–111)
Creatinine, Ser: 0.49 mg/dL (ref 0.44–1.00)
GFR calc Af Amer: >60 mL/min (ref >60)
GFR calc non Af Amer: >60 mL/min (ref >60)
Glucose, Bld: 282 mg/dL — ABNORMAL HIGH (ref 70–99)
Potassium: 4.2 mmol/L (ref 3.5–5.1)
Sodium: 133 mmol/L — ABNORMAL LOW (ref 135–145)
Total Bilirubin: 1.3 mg/dL — ABNORMAL HIGH (ref 0.3–1.2)
Total Protein: 5.9 g/dL — ABNORMAL LOW (ref 6.5–8.1)

## 2019-11-10 LAB — GLUCOSE, CAPILLARY
Glucose-Capillary: 257 mg/dL — ABNORMAL HIGH (ref 70–99)
Glucose-Capillary: 207 mg/dL — ABNORMAL HIGH (ref 70–99)
Glucose-Capillary: 197 mg/dL — ABNORMAL HIGH (ref 70–99)
Glucose-Capillary: 257 mg/dL — ABNORMAL HIGH (ref 70–99)

## 2019-11-10 LAB — CBC WITH DIFFERENTIAL/PLATELET
Abs Immature Granulocytes: 0.06 10*3/uL (ref 0.00–0.07)
Basophils Absolute: 0 10*3/uL (ref 0.0–0.1)
Basophils Relative: 0 %
Eosinophils Absolute: 0 10*3/uL (ref 0.0–0.5)
Eosinophils Relative: 0 %
HCT: 37.5 % (ref 36.0–46.0)
Hemoglobin: 12.4 g/dL (ref 12.0–15.0)
Immature Granulocytes: 1 %
Lymphocytes Relative: 38 %
Lymphs Abs: 2.6 10*3/uL (ref 0.7–4.0)
MCH: 31.2 pg (ref 26.0–34.0)
MCHC: 33.1 g/dL (ref 30.0–36.0)
MCV: 94.5 fL (ref 80.0–100.0)
Monocytes Absolute: 0.4 10*3/uL (ref 0.1–1.0)
Monocytes Relative: 7 %
Neutro Abs: 3.6 10*3/uL (ref 1.7–7.7)
Neutrophils Relative %: 54 %
Platelets: 299 10*3/uL (ref 150–400)
RBC: 3.97 MIL/uL (ref 3.87–5.11)
RDW: 11.7 % (ref 11.5–15.5)
WBC: 6.7 10*3/uL (ref 4.0–10.5)
nRBC: 0 % (ref 0.0–0.2)

## 2019-11-10 LAB — PHOSPHORUS: Phosphorus: 3.3 mg/dL (ref 2.5–4.6)

## 2019-11-10 LAB — MAGNESIUM: Magnesium: 2.1 mg/dL (ref 1.7–2.4)

## 2019-11-10 MED ORDER — INSULIN ASPART PROT & ASPART (70-30 MIX) 100 UNIT/ML ~~LOC~~ SUSP
17.0000 [IU] | Freq: Two times a day (BID) | SUBCUTANEOUS | Status: DC
  Filled 2019-11-10: qty 10

## 2019-11-10 MED ORDER — HYDROCORTISONE 1 % EX OINT
TOPICAL_OINTMENT | Freq: Four times a day (QID) | CUTANEOUS | Status: DC
  Administered 2019-11-10: 1 via TOPICAL
  Filled 2019-11-10: qty 28.35

## 2019-11-10 MED ORDER — DIPHENHYDRAMINE HCL 25 MG PO CAPS
25.0000 mg | ORAL_CAPSULE | Freq: Four times a day (QID) | ORAL | Status: DC | PRN
  Administered 2019-11-10 (×2): 25 mg via ORAL
  Filled 2019-11-10 (×2): qty 1

## 2019-11-10 MED ORDER — INSULIN ASPART PROT & ASPART (70-30 MIX) 100 UNIT/ML ~~LOC~~ SUSP
17.0000 [IU] | Freq: Two times a day (BID) | SUBCUTANEOUS | Status: DC
  Administered 2019-11-10 (×2): 17 [IU] via SUBCUTANEOUS
  Filled 2019-11-10 (×2): qty 10

## 2019-11-10 NOTE — Progress Notes (Signed)
PROGRESS NOTE    Cheryl Parker  MWN:[id number] DOB: 1980/12/04 DOA: 11/08/2019 PCP: Patient, No Pcp Per  Chief Complaint  Patient presents with  . Abdominal Pain  . Abscess   Brief Narrative:  Cheryl Parker is Cheryl Parker 39 y.o. female with medical history significant of type 1 diabetes with prior DKA, hypertension, GERD, who came to the ER essentially complaining of abscess of the right buttock that came in 3 days ago.  This has busted out with some drainage.  Patient tried Neosporin and peroxide on the area but appears not to have improved.  She also reported taking some apple cider with vinegar as well as water to lower her blood pressure based on which she is on [employer].  Since then she has been having abdominal pain and feels unwell.  In the ER patient was found to be in diabetic ketoacidosis.  Also noted to have the right buttocks cellulitis.  Based on imaging there was no drainable abscess.  She is being admitted for the management of the DKA but also cellulitis of the right buttocks.  No fever or chills at the moment.  She is unable to sit on the right buttock secondary to the pain..  ED Course: Temperature 98.6 blood pressure 140/97 pulse 120 respirate 25 oxygen sat 98% on room air.  White count 11.1 CO2 20 creatinine 0.36 and calcium 8.5 the rest of the lab work appeared to be within normal.  Initial chemistry showed an anion gap of 16.  Ketosis and evidence of diabetic ketoacidosis.  Patient was initiated on IV insulin drip and admitted to the hospital for treatment.  Assessment & Plan:   Principal Problem:   DKA (diabetic ketoacidoses) (HCC) Active Problems:   HLD (hyperlipidemia)   Essential hypertension  #1 diabetic ketoacidosis:  Resolved. Resume home 70/30 Hyperglycemic this AM, increase to 17 units BID  Diabetes coordinator AG normalized, normal CO2 Improved, continue to monitor BG's  #2 right buttock abscess:  No drainable abscess on imaging  Continue vanc/ceftriaxone  for now CT with possible perianal fistula (will discuss with surgery) - no evidence of drainable abscess - surgery planning to help with outpatient follow up  # Right Inguinal Lymphadenopathy: needs follow up CT in 3 months to ensure resolutation  #3 hypertension: Blood pressure appears controlled.  Use home regimen.  #4 hyperlipidemia: Continue statin  # Elevated LFT's: acute hepatitis panel - negative, no suspicious focal abnormality within liver parenchyma noted on imaging, follow  DVT prophylaxis: lovenox Code Status: full  Family Communication: none at bedside Disposition:   Status is: Inpatient  Remains inpatient appropriate because:Inpatient level of care appropriate due to severity of illness   Dispo: The patient is from: Home              Anticipated d/c is to: Home              Anticipated d/c date is: 2 days              Patient currently is not medically stable to d/c.  Consultants:   none  Procedures: none  Antimicrobials:  Anti-infectives (From admission, onward)   Start     Dose/Rate Route Frequency Ordered Stop   11/09/19 2200  cefTRIAXone (ROCEPHIN) 1 g in sodium chloride 0.9 % 100 mL IVPB     Discontinue     1 g 200 mL/hr over 30 Minutes Intravenous Every 24 hours 11/09/19 1459     11/09/19 1200  vancomycin (VANCOCIN) IVPB 1000 mg/200  mL premix     Discontinue     1,000 mg 200 mL/hr over 60 Minutes Intravenous Every 12 hours 11/09/19 0516     11/09/19 0600  ceFEPIme (MAXIPIME) 2 g in sodium chloride 0.9 % 100 mL IVPB  Status:  Discontinued        2 g 200 mL/hr over 30 Minutes Intravenous Every 8 hours 11/09/19 0516 11/09/19 1459   11/08/19 2245  vancomycin (VANCOREADY) IVPB 1500 mg/300 mL        1,500 mg 150 mL/hr over 120 Minutes Intravenous  Once 11/08/19 2231 11/09/19 0157   11/08/19 2245  ceFEPIme (MAXIPIME) 2 g in sodium chloride 0.9 % 100 mL IVPB        2 g 200 mL/hr over 30 Minutes Intravenous  Once 11/08/19 2231 11/08/19 2333   11/08/19  1330  ceFAZolin (ANCEF) IVPB 1 g/50 mL premix        1 g 100 mL/hr over 30 Minutes Intravenous  Once 11/08/19 1318 11/08/19 1432      Subjective: Continued pain  Objective: Vitals:   11/10/19 0034 11/10/19 0440 11/10/19 0829 11/10/19 1528  BP: 111/69 101/64 117/84 122/77  Pulse: 92 87 86 94  Resp: 16 16 20 18   Temp: 97.9 F (36.6 C) 98.4 F (36.9 C) 97.6 F (36.4 C) 98.2 F (36.8 C)  TempSrc:   Oral Oral  SpO2: 98% 94% 100% 98%  Weight:      Height:        Intake/Output Summary (Last 24 hours) at 11/10/2019 1549 Last data filed at 11/10/2019 1305 Gross per 24 hour  Intake 480 ml  Output --  Net 480 ml   Filed Weights   11/08/19 1051  Weight: 71.8 kg    Examination:  General: No acute distress. Cardiovascular: Heart sounds show Yoshino Broccoli regular rate, and rhythm Lungs: Clear to auscultation bilaterally Abdomen: Soft, nontender, nondistended R buttocks with area of induration at least 3 cm with overlying scan, no fluctuance - RN present during exam Neurological: Alert and oriented 3. Moves all extremities 4. Cranial nerves II through XII grossly intact. Skin: Warm and dry. No rashes or lesions. Extremities: No clubbing or cyanosis. No edema  Data Reviewed: I have personally reviewed following labs and imaging studies  CBC: Recent Labs  Lab 11/08/19 1152 11/08/19 1422 11/08/19 2237 11/10/19 0351  WBC 12.9*  --  11.1* 6.7  NEUTROABS 10.7*  --   --  3.6  HGB 14.0 14.3 12.2 12.4  HCT 43.1 42.0 37.4 37.5  MCV 96.2  --  96.6 94.5  PLT 374  --  343 299    Basic Metabolic Panel: Recent Labs  Lab 11/09/19 0255 11/09/19 0559 11/09/19 1018 11/09/19 1456 11/10/19 0351  NA 135 133* 136 137 133*  K 3.4* 3.5 3.8 4.3 4.2  CL 106 105 106 102 101  CO2 20* 20* 22 21* 22  GLUCOSE 218* 211* 264* 352* 282*  BUN 6 7 7 7 8   CREATININE 0.46 0.40* 0.38* 0.49 0.49  CALCIUM 8.1* 8.3* 8.3* 9.0 8.7*  MG  --   --   --   --  2.1  PHOS  --   --   --   --  3.3     GFR: Estimated Creatinine Clearance: 87.6 mL/min (by C-G formula based on SCr of 0.49 mg/dL).  Liver Function Tests: Recent Labs  Lab 11/08/19 1152 11/10/19 0351  AST 54* 40  ALT 111* 59*  ALKPHOS 223* 143*  BILITOT 0.9  1.3*  PROT 7.7 5.9*  ALBUMIN 3.8 2.9*    CBG: Recent Labs  Lab 11/09/19 1200 11/09/19 1547 11/09/19 2028 11/10/19 0806 11/10/19 1226  GLUCAP 218* 294* 224* 257* 207*     Recent Results (from the past 240 hour(s))  Blood culture (routine x 2)     Status: None (Preliminary result)   Collection Time: 11/08/19  1:45 PM   Specimen: BLOOD  Result Value Ref Range Status   Specimen Description   Final    BLOOD BLOOD RIGHT FOREARM Performed at [facility], [address] [place], [place] [address]    Special Requests   Final    BOTTLES DRAWN AEROBIC AND ANAEROBIC Blood Culture adequate volume Performed at [facility], [address] [place], [place] [address]    Culture   Final    NO GROWTH 2 DAYS Performed at [facility], [address], [place], [place] [address]    Report Status PENDING  Incomplete  SARS Coronavirus 2 by RT PCR (hospital order, performed in [facility] hospital lab) Nasopharyngeal Peripheral     Status: None   Collection Time: 11/08/19  1:54 PM   Specimen: Peripheral; Nasopharyngeal  Result Value Ref Range Status   SARS Coronavirus 2 NEGATIVE NEGATIVE Final    Comment: (NOTE) SARS-CoV-2 target nucleic acids are NOT DETECTED.  The SARS-CoV-2 RNA is generally detectable in upper and lower respiratory specimens during the acute phase of infection. The lowest concentration of SARS-CoV-2 viral copies this assay can detect is 250 copies / mL. Zylee Marchiano negative result does not preclude SARS-CoV-2 infection and should not be used as the sole basis for treatment or other patient management decisions.  Duvan Mousel negative result may occur with improper specimen collection / handling, submission of specimen other than  nasopharyngeal swab, presence of viral mutation(s) within the areas targeted by this assay, and inadequate number of viral copies (<250 copies / mL). Emonte Dieujuste negative result must be combined with clinical observations, patient history, and epidemiological information.  Fact Sheet for Patients:   BoilerBrush.com.cy  Fact Sheet for Healthcare Providers: https://pope.com/  This test is not yet approved or  cleared by the [place] FDA and has been authorized for detection and/or diagnosis of SARS-CoV-2 by FDA under an Emergency Use Authorization (EUA).  This EUA will remain in effect (meaning this test can be used) for the duration of the COVID-19 declaration under Section 564(b)(1) of the Act, 21 U.S.C. section 360bbb-3(b)(1), unless the authorization is terminated or revoked sooner.  Performed at [facility], [address] [place], [place] [address]   Blood culture (routine x 2)     Status: None (Preliminary result)   Collection Time: 11/08/19  2:15 PM   Specimen: BLOOD  Result Value Ref Range Status   Specimen Description   Final    BLOOD BLOOD LEFT FOREARM Performed at [facility], [address] [place], [place] [address]    Special Requests   Final    BOTTLES DRAWN AEROBIC AND ANAEROBIC Blood Culture results may not be optimal due to an inadequate volume of blood received in culture bottles Performed at [facility], [address] [place], [place] [address]    Culture   Final    NO GROWTH 2 DAYS Performed at [facility], [address] [place], [place] [address]    Report Status PENDING  Incomplete  MRSA PCR Screening  Status: None   Collection Time: 11/08/19  9:46 PM   Specimen: Nasal Mucosa; Nasopharyngeal  Result Value Ref Range Status   MRSA by PCR NEGATIVE NEGATIVE Final    Comment:        The GeneXpert MRSA Assay (FDA approved for NASAL specimens only), is one  component of Anwar Sakata comprehensive MRSA colonization surveillance program. It is not intended to diagnose MRSA infection nor to guide or monitor treatment for MRSA infections. Performed at [facility] [address], [place], [place] [address]          Radiology Studies: No results found.      Scheduled Meds: . Chlorhexidine Gluconate Cloth  6 each Topical Daily  . enoxaparin (LOVENOX) injection  40 mg Subcutaneous QHS  . hydrocortisone   Topical QID  . insulin aspart  0-5 Units Subcutaneous QHS  . insulin aspart  0-9 Units Subcutaneous TID WC  . insulin aspart protamine- aspart  17 Units Subcutaneous BID WC   Continuous Infusions: . sodium chloride 1,000 mL (11/09/19 2110)  . cefTRIAXone (ROCEPHIN)  IV 1 g (11/09/19 2322)  . vancomycin 1,000 mg (11/10/19 1302)     LOS: 2 days    Time spent: over 30 min    Lacretia Nicks, MD [facility]   To contact the attending provider between 7A-7P or the covering provider during after hours 7P-7A, please log into the web site [email or web address] and access using universal [place] password for that web site. If you do not have the password, please call the hospital operator.  11/10/2019, 3:49 PM

## 2019-11-10 NOTE — Progress Notes (Signed)
Pt transferred 3 East, report given to RN.

## 2019-11-11 LAB — COMPREHENSIVE METABOLIC PANEL
ALT: 57 U/L — ABNORMAL HIGH (ref 0–44)
AST: 43 U/L — ABNORMAL HIGH (ref 15–41)
Albumin: 2.9 g/dL — ABNORMAL LOW (ref 3.5–5.0)
Alkaline Phosphatase: 138 U/L — ABNORMAL HIGH (ref 38–126)
Anion gap: 8 (ref 5–15)
BUN: 9 mg/dL (ref 6–20)
CO2: 27 mmol/L (ref 22–32)
Calcium: 8.7 mg/dL — ABNORMAL LOW (ref 8.9–10.3)
Chloride: 101 mmol/L (ref 98–111)
Creatinine, Ser: 0.57 mg/dL (ref 0.44–1.00)
GFR calc Af Amer: >60 mL/min (ref >60)
GFR calc non Af Amer: >60 mL/min (ref >60)
Glucose, Bld: 237 mg/dL — ABNORMAL HIGH (ref 70–99)
Potassium: 3.8 mmol/L (ref 3.5–5.1)
Sodium: 136 mmol/L (ref 135–145)
Total Bilirubin: 0.5 mg/dL (ref 0.3–1.2)
Total Protein: 5.9 g/dL — ABNORMAL LOW (ref 6.5–8.1)

## 2019-11-11 LAB — CBC WITH DIFFERENTIAL/PLATELET
Abs Immature Granulocytes: 0.08 10*3/uL — ABNORMAL HIGH (ref 0.00–0.07)
Basophils Absolute: 0 10*3/uL (ref 0.0–0.1)
Basophils Relative: 0 %
Eosinophils Absolute: 0 10*3/uL (ref 0.0–0.5)
Eosinophils Relative: 0 %
HCT: 35.5 % — ABNORMAL LOW (ref 36.0–46.0)
Hemoglobin: 11.6 g/dL — ABNORMAL LOW (ref 12.0–15.0)
Immature Granulocytes: 1 %
Lymphocytes Relative: 25 %
Lymphs Abs: 2.1 10*3/uL (ref 0.7–4.0)
MCH: 31.4 pg (ref 26.0–34.0)
MCHC: 32.7 g/dL (ref 30.0–36.0)
MCV: 95.9 fL (ref 80.0–100.0)
Monocytes Absolute: 0.8 10*3/uL (ref 0.1–1.0)
Monocytes Relative: 10 %
Neutro Abs: 5.3 10*3/uL (ref 1.7–7.7)
Neutrophils Relative %: 64 %
Platelets: 379 10*3/uL (ref 150–400)
RBC: 3.7 MIL/uL — ABNORMAL LOW (ref 3.87–5.11)
RDW: 11.7 % (ref 11.5–15.5)
WBC: 8.4 10*3/uL (ref 4.0–10.5)
nRBC: 0 % (ref 0.0–0.2)

## 2019-11-11 LAB — PHOSPHORUS: Phosphorus: 4.1 mg/dL (ref 2.5–4.6)

## 2019-11-11 LAB — GLUCOSE, CAPILLARY
Glucose-Capillary: 236 mg/dL — ABNORMAL HIGH (ref 70–99)
Glucose-Capillary: 205 mg/dL — ABNORMAL HIGH (ref 70–99)
Glucose-Capillary: 224 mg/dL — ABNORMAL HIGH (ref 70–99)

## 2019-11-11 LAB — MAGNESIUM: Magnesium: 1.8 mg/dL (ref 1.7–2.4)

## 2019-11-11 MED ORDER — IBUPROFEN 400 MG PO TABS: 400.0000 mg | ORAL_TABLET | Freq: Four times a day (QID) | ORAL | 0 refills | Status: DC | PRN

## 2019-11-11 MED ORDER — OXYCODONE HCL 5 MG PO TABS: 5.0000 mg | ORAL_TABLET | Freq: Four times a day (QID) | ORAL | 0 refills | Status: AC | PRN

## 2019-11-11 MED ORDER — DOXYCYCLINE HYCLATE 100 MG PO TBEC: 100.0000 mg | DELAYED_RELEASE_TABLET | Freq: Two times a day (BID) | ORAL | 0 refills | Status: AC

## 2019-11-11 MED ORDER — NOVOLIN 70/30 (70-30) 100 UNIT/ML ~~LOC~~ SUSP
21.0000 [IU] | Freq: Two times a day (BID) | SUBCUTANEOUS | 0 refills | Status: DC
Start: 2019-11-11 — End: 2019-11-21

## 2019-11-11 MED ORDER — AMOXICILLIN-POT CLAVULANATE 250-62.5 MG/5ML PO SUSR: 250.0000 mg | Freq: Two times a day (BID) | ORAL | 0 refills | Status: AC

## 2019-11-11 MED ORDER — INSULIN ASPART PROT & ASPART (70-30 MIX) 100 UNIT/ML ~~LOC~~ SUSP
21.0000 [IU] | Freq: Two times a day (BID) | SUBCUTANEOUS | Status: DC
  Administered 2019-11-11 (×3): 21 [IU] via SUBCUTANEOUS
  Filled 2019-11-11: qty 10

## 2019-11-11 MED ORDER — BLOOD GLUCOSE MONITOR KIT: PACK | 0 refills | Status: DC

## 2019-11-11 NOTE — Discharge Summary (Signed)
Physician Discharge Summary  Cheryl Parker OBS:[id number] DOB: 1980-09-29 DOA: 11/08/2019  PCP: Patient, No Pcp Per  Admit date: 11/08/2019 Discharge date: 11/11/2019  Time spent: 40 minutes  Recommendations for Outpatient Follow-up:  1. Follow outpatient CBC/CMP 2. Continue abx outpatient 3. Pt needs follow up outpatient with surgery for possible fistula - will need to follow up with colorectal surgery 4. Follow up right inguinal LAD outpatient - needs CT in 3 months 5. Follow blood sugars outpatient - adjust insulin regimen as needed 6. Establish with PCP 7. Follow blood pressure outpatient - BP ok here without bp meds - follow outpatient 8. Follow LFT's outpatient   Discharge Diagnoses:  Principal Problem:   DKA (diabetic ketoacidoses) (Cheryl Parker) Active Problems:   HLD (hyperlipidemia)   Essential hypertension   Discharge Condition: stable  Diet recommendation: diabetic  Filed Weights   11/08/19 1051  Weight: 71.8 kg   History of present illness:  Cheryl Milleris Cheryl Parker 39 y.o.femalewith medical history significant oftype 1 diabetes with prior DKA, hypertension, GERD, who came to the ER essentially complaining of abscess of the right buttock that came in 3 days ago. This has busted out with some drainage. Patient tried Neosporin and peroxide on the area but appears not to have improved. She also reported taking some apple cider with vinegar as well as water to lower her blood pressure based on which she is on [place]. Since then she has been having abdominal pain and feels unwell. In the ER patient was found to be in diabetic ketoacidosis. Also noted to have the right buttocks cellulitis. Based on imaging there was no drainable abscess. She is being admitted for the management of the DKA but also cellulitis of the right buttocks. No fever or chills at the moment. She is unable to sit on the right buttock secondary to the pain..  ED Course:Temperature 98.6 blood pressure  140/97 pulse 120 respirate 25 oxygen sat 98% on room air. White count 11.1 CO2 20 creatinine 0.36 and calcium 8.5 the rest of the lab work appeared to be within normal. Initial chemistry showed an anion gap of 16. Ketosis and evidence of diabetic ketoacidosis. Patient was initiated on IV insulin drip and admitted to the hospital for treatment.  She was admitted for DKA and Cheryl Parker right gluteal abscess which spontaneously drained.  She's improved with IV abx.  She was discharged on 7/18 with plans for outpatient follow up.  Will need to establish with surgery and PCP.  See below for additional details  Hospital Course:  #1 diabetic ketoacidosis: Resolved. Resume home 70/30 Hyperglycemic this AM, increase to 21 units BID at discharge - needs outpatient follow up Diabetes coordinator AG normalized, normal CO2 Improved, continue to monitor BG's  #2 right buttock abscess: No drainable abscess on imaging  Continue vanc/ceftriaxone for now CT with possible perianal fistula (will discuss with surgery - surgery will help arrange f/u with colorectal surgery) - no evidence of drainable abscess - needs outpatient follow up with colorectal surgery  # Right Inguinal Lymphadenopathy: needs follow up CT in 3 months to ensure resolutation  #3 hypertension:stable off BP meds, follow outpatient   #4 hyperlipidemia:Continue statin  # Elevated LFT's: acute hepatitis panel - negative, no suspicious focal abnormality within liver parenchyma noted on imaging, follow  Procedures: none  Consultations:  none  Discharge Exam: Vitals:   11/11/19 0610 11/11/19 1454  BP: 104/65 116/73  Pulse: 96 (!) 108  Resp: 17 20  Temp: 98.3 F (36.8 C) 98.2 F (  36.8 C)  SpO2: 100% 100%   C/o some soreness to bottom Needs to get home to take care of kids  General: No acute distress. Cardiovascular: Heart sounds show Cheryl Parker regular rate, and rhythm Lungs: Clear to auscultation bilaterally  Abdomen: Soft,  nontender, nondistended  R buttock with area of induration with overlying scab, no fluctuance Neurological: Alert and oriented 3. Moves all extremities 4 . Cranial nerves II through XII grossly intact. Skin: Warm and dry. No rashes or lesions. Extremities: No clubbing or cyanosis. No edema.   Discharge Instructions   Discharge Instructions    Call MD for:  difficulty breathing, headache or visual disturbances   Complete by: As directed    Call MD for:  extreme fatigue   Complete by: As directed    Call MD for:  hives   Complete by: As directed    Call MD for:  persistant dizziness or light-headedness   Complete by: As directed    Call MD for:  persistant nausea and vomiting   Complete by: As directed    Call MD for:  redness, tenderness, or signs of infection (pain, swelling, redness, odor or green/yellow discharge around incision site)   Complete by: As directed    Call MD for:  severe uncontrolled pain   Complete by: As directed    Call MD for:  temperature >100.4   Complete by: As directed    Diet - low sodium heart healthy   Complete by: As directed    Discharge instructions   Complete by: As directed    You were seen for high blood sugars and an abscess on your buttocks that has spontaneously drained.  You have improved with antibiotics.  Please continue the doxycycline and augmentin at home.  Watch for fevers, chills, worsening pain, drainage, or swelling - if you notice these symptoms, return to care.    You have Cheryl Parker possible fistula on your imaging.  Please follow up with surgery as an outpatient for management of this.  We've increased your insulin to 21 units twice daily (instead of 13 units, 3 times Cheryl Parker day).  You have lymph nodes in your groin that should be followed with repeat imaging in 3 months.  Return for new, recurrent, or worsening symptoms.  Please ask your PCP to request records from this hospitalization so they know what was done and what the next  steps will be.   Discharge wound care:   Complete by: As directed    Continue daily dressing changes with dry dressing daily   Increase activity slowly   Complete by: As directed      Allergies as of 11/11/2019   No Known Allergies     Medication List    STOP taking these medications   lisinopril 5 MG tablet Commonly known as: ZESTRIL   omeprazole 20 MG capsule Commonly known as: PriLOSEC   polyethylene glycol 17 g packet Commonly known as: MiraLax   promethazine 12.5 MG tablet Commonly known as: PHENERGAN     TAKE these medications   acetaminophen 500 MG tablet Commonly known as: TYLENOL Take 1,000 mg by mouth every 6 (six) hours as needed for mild pain.   amoxicillin-clavulanate 250-62.5 MG/5ML suspension Commonly known as: AUGMENTIN Take 5 mLs (250 mg total) by mouth 2 (two) times daily for 10 days. Notes to patient: 11/12/2019   blood glucose meter kit and supplies Kit Dispense based on patient and insurance preference. Use up to four times daily as directed. (FOR  ICD-9 250.00, 250.01).   doxycycline 100 MG EC tablet Commonly known as: DORYX Take 1 tablet (100 mg total) by mouth 2 (two) times daily for 10 days. Notes to patient: 11/12/2019   ibuprofen 400 MG tablet Commonly known as: ADVIL Take 1 tablet (400 mg total) by mouth every 6 (six) hours as needed for fever or moderate pain. Notes to patient: 11/11/2019   NovoLIN 70/30 (70-30) 100 UNIT/ML injection Generic drug: insulin NPH-regular Human Inject 21 Units into the skin 2 (two) times daily with Seferina Brokaw meal. Follow up with PCP for adjustments to insulin regimen What changed:   how much to take  how to take this  when to take this  additional instructions Notes to patient: 11/12/2019    oxyCODONE 5 MG immediate release tablet Commonly known as: Oxy IR/ROXICODONE Take 1 tablet (5 mg total) by mouth every 6 (six) hours as needed for up to 3 days for severe pain. Notes to patient: 11/11/2019 @ 7:30 pm              Discharge Care Instructions  (From admission, onward)         Start     Ordered   11/11/19 0000  Discharge wound care:       Comments: Continue daily dressing changes with dry dressing daily   11/11/19 1327         No Known Allergies  Follow-up Information    [facility] Follow up.   Why: Make Annistyn Depass follow up appointment here to establish primary care provider. They take patients without insurance and they have financial counselors. Contact information: [place] [address] [phone]       [facility] Follow up.   Specialty: General Surgery Why: Call for Gwen Edler follow up with the colorectal surgeons for your possible fistula Contact information: [place] [address] [phone]                The results of significant diagnostics from this hospitalization (including imaging, microbiology, ancillary and laboratory) are listed below for reference.    Significant Diagnostic Studies: CT ABDOMEN PELVIS W CONTRAST  Result Date: 11/08/2019 CLINICAL DATA:  Abscess to buttocks for 2 days. Right lower quadrant pain for 3 days. Nausea. EXAM: CT ABDOMEN AND PELVIS WITH CONTRAST TECHNIQUE: Multidetector CT imaging of the abdomen and pelvis was performed using the standard protocol following bolus administration of intravenous contrast. CONTRAST:  145m OMNIPAQUE IOHEXOL 300 MG/ML  SOLN COMPARISON:  06/03/2017 FINDINGS: Lower chest: Unremarkable Hepatobiliary: No suspicious focal abnormality within the liver parenchyma. Small area of low attenuation in the anterior liver, adjacent to the falciform ligament, is in Nijae Doyel characteristic location for focal fatty deposition. There is no evidence for gallstones, gallbladder wall thickening, or pericholecystic fluid. No intrahepatic or extrahepatic biliary dilation. Pancreas: No focal mass lesion. No dilatation of the main duct. No  intraparenchymal cyst. No peripancreatic edema. Spleen: No splenomegaly. No focal mass lesion. Adrenals/Urinary Tract: No adrenal nodule or mass. No evidence for hydroureter. The urinary bladder appears normal for the degree of distention. Stomach/Bowel: Stomach is unremarkable. No gastric wall thickening. No evidence of outlet obstruction. Duodenum is normally positioned as is the ligament of Treitz. No small bowel wall thickening. No small bowel dilatation. The terminal ileum is normal. The appendix is normal. No gross colonic mass. No colonic wall thickening. There is perianal edema/inflammation with Lokelani Lutes linear band of soft tissue tracking from  the right perianal region through the subcutaneous fat towards the right skin of the intergluteal fold, similar to prior study. Associated subcutaneous edema/fluid is progressive in the interval. Vascular/Lymphatic: There is abdominal aortic atherosclerosis without aneurysm. There is no gastrohepatic or hepatoduodenal ligament lymphadenopathy. No retroperitoneal or mesenteric lymphadenopathy. No pelvic sidewall lymphadenopathy. Interval development of lymphadenopathy in the right groin with 1.4 cm short axis lymph node visible on image 81/2. Reproductive: The uterus is unremarkable.  There is no adnexal mass. Other: No intraperitoneal free fluid. Musculoskeletal: No worrisome lytic or sclerotic osseous abnormality. Sclerotic foci in the sacrum are probably bone islands, stable in the interval. Lipoma in the right lower lateral abdominal wall musculature is probably in the plane between the transversus abdominus and the internal oblique, stable since prior. IMPRESSION: 1. Similar appearance of the perianal edema/inflammation with [place] linear band of soft tissue tracking from the right perianal region through the subcutaneous fat towards the right skin of the intergluteal fold. As this was present previously and may reflect scarring. Associated subcutaneous edema/fluid is  progressive in the interval. Perianal fistula not excluded. Cellulitis cannot be excluded. No evidence for drainable abscess at this time. 2. Interval development of right inguinal lymphadenopathy, likely reactive. Follow-up CT in 3 months could be used to ensure resolution. 3. Aortic Atherosclerosis (ICD10-I70.0). Electronically Signed   By: Misty Stanley M.D.   On: 11/08/2019 12:59    Microbiology: Recent Results (from the past 240 hour(s))  Blood culture (routine x 2)     Status: None (Preliminary result)   Collection Time: 11/08/19  1:45 PM   Specimen: BLOOD  Result Value Ref Range Status   Specimen Description   Final    BLOOD BLOOD RIGHT FOREARM Performed at [facility], [address] [place], [place] [address]    Special Requests   Final    BOTTLES DRAWN AEROBIC AND ANAEROBIC Blood Culture adequate volume Performed at [facility], [address] [place], [place] [address]    Culture   Final    NO GROWTH 3 DAYS Performed at [facility], [place] [address], [place], [place] [address]    Report Status PENDING  Incomplete  SARS Coronavirus 2 by RT PCR (hospital order, performed in [facility] hospital lab) Nasopharyngeal Peripheral     Status: None   Collection Time: 11/08/19  1:54 PM   Specimen: Peripheral; Nasopharyngeal  Result Value Ref Range Status   SARS Coronavirus 2 NEGATIVE NEGATIVE Final    Comment: (NOTE) SARS-CoV-2 target nucleic acids are NOT DETECTED.  The SARS-CoV-2 RNA is generally detectable in upper and lower respiratory specimens during the acute phase of infection. The lowest concentration of SARS-CoV-2 viral copies this assay can detect is 250 copies / mL. Avarey Yaeger negative result does not preclude SARS-CoV-2 infection and should not be used as the sole basis for treatment or other patient management decisions.  Amed Datta negative result may occur with improper specimen collection / handling, submission of specimen other than nasopharyngeal  swab, presence of viral mutation(s) within the areas targeted by this assay, and inadequate number of viral copies (<250 copies / mL). Kearsten Ginther negative result must be combined with clinical observations, patient history, and epidemiological information.  Fact Sheet for Patients:   StrictlyIdeas.no  Fact Sheet for Healthcare Providers: BankingDealers.co.za  This test is not yet approved or  cleared by the [place] FDA and has been authorized for detection and/or diagnosis of SARS-CoV-2 by FDA under an Emergency  Use Authorization (EUA).  This EUA will remain in effect (meaning this test can be used) for the duration of the COVID-19 declaration under Section 564(b)(1) of the Act, 21 U.S.C. section 360bbb-3(b)(1), unless the authorization is terminated or revoked sooner.  Performed at [facility], [address] [place], [place] [address]   Blood culture (routine x 2)     Status: None (Preliminary result)   Collection Time: 11/08/19  2:15 PM   Specimen: BLOOD  Result Value Ref Range Status   Specimen Description   Final    BLOOD BLOOD LEFT FOREARM Performed at [facility], [address] [place], [place] [address]    Special Requests   Final    BOTTLES DRAWN AEROBIC AND ANAEROBIC Blood Culture results may not be optimal due to an inadequate volume of blood received in culture bottles Performed at [facility], [address] [place], [place] [address]    Culture   Final    NO GROWTH 3 DAYS Performed at [facility], [place] [address] [place], [place] [address]    Report Status PENDING  Incomplete  MRSA PCR Screening     Status: None   Collection Time: 11/08/19  9:46 PM   Specimen: Nasal Mucosa; Nasopharyngeal  Result Value Ref Range Status   MRSA by PCR NEGATIVE NEGATIVE Final    Comment:        The GeneXpert MRSA Assay (FDA approved for NASAL specimens only), is one component of  Lujain Kraszewski comprehensive MRSA colonization surveillance program. It is not intended to diagnose MRSA infection nor to guide or monitor treatment for MRSA infections. Performed at [facility], [place] [address] [place], [place] [address]      Labs: Basic Metabolic Panel: Recent Labs  Lab 11/09/19 0559 11/09/19 1018 11/09/19 1456 11/10/19 0351 11/11/19 0621  NA 133* 136 137 133* 136  K 3.5 3.8 4.3 4.2 3.8  CL 105 106 102 101 101  CO2 20* 22 21* 22 27  GLUCOSE 211* 264* 352* 282* 237*  BUN '7 7 7 8 9  ' CREATININE 0.40* 0.38* 0.49 0.49 0.57  CALCIUM 8.3* 8.3* 9.0 8.7* 8.7*  MG  --   --   --  2.1 1.8  PHOS  --   --   --  3.3 4.1   Liver Function Tests: Recent Labs  Lab 11/08/19 1152 11/10/19 0351 11/11/19 0621  AST 54* 40 43*  ALT 111* 59* 57*  ALKPHOS 223* 143* 138*  BILITOT 0.9 1.3* 0.5  PROT 7.7 5.9* 5.9*  ALBUMIN 3.8 2.9* 2.9*   No results for input(s): LIPASE, AMYLASE in the last 168 hours. No results for input(s): AMMONIA in the last 168 hours. CBC: Recent Labs  Lab 11/08/19 1152 11/08/19 1422 11/08/19 2237 11/10/19 0351 11/11/19 0621  WBC 12.9*  --  11.1* 6.7 8.4  NEUTROABS 10.7*  --   --  3.6 5.3  HGB 14.0 14.3 12.2 12.4 11.6*  HCT 43.1 42.0 37.4 37.5 35.5*  MCV 96.2  --  96.6 94.5 95.9  PLT 374  --  343 299 379   Cardiac Enzymes: No results for input(s): CKTOTAL, CKMB, CKMBINDEX, TROPONINI in the last 168 hours. BNP: BNP (last 3 results) No results for input(s): BNP in the last 8760 hours.  ProBNP (last 3 results) No results for input(s): PROBNP in the last 8760 hours.  CBG: Recent Labs  Lab 11/10/19 1708 11/10/19 2106 11/11/19 0741 11/11/19  1242 11/11/19 1725  GLUCAP 197* 257* 236* 205* 224*       Signed:  Fayrene Helper MD.  [facility] 11/11/2019, 5:46 PM

## 2019-11-11 NOTE — TOC Initial Note (Signed)
Transition of Care [facility]) - Initial/Assessment Note    Patient Details  Name: Cheryl Parker MRN: [id number] Date of Birth: May 04, 1980  Transition of Care (TOC) CM/SW Contact:    Armanda Heritage, RN Phone Number: 11/11/2019, 4:37 PM  Clinical Narrative:                 Information for [facility] placed on AVS.  CM is unable to schedule appointment due to clinc closed over the weekend. MATCH letter provided.  Expected Discharge Plan: Home/Self Care Barriers to Discharge: No Barriers Identified   Patient Goals and CMS Choice Patient states their goals for this hospitalization and ongoing recovery are:: to go home      Expected Discharge Plan and Services Expected Discharge Plan: Home/Self Care   Discharge Planning Services: CM Consult     Expected Discharge Date: 11/11/19                                    Prior Living Arrangements/Services     Patient language and need for interpreter reviewed:: Yes        Need for Family Participation in Patient Care: No (Comment) Care giver support system in place?: Yes (comment)   Criminal Activity/Legal Involvement Pertinent to Current Situation/Hospitalization: No - Comment as needed  Activities of Daily Living Home Assistive Devices/Equipment: None ADL Screening (condition at time of admission) Patient's cognitive ability adequate to safely complete daily activities?: Yes Is the patient deaf or have difficulty hearing?: No Does the patient have difficulty seeing, even when wearing glasses/contacts?: No Does the patient have difficulty concentrating, remembering, or making decisions?: No Patient able to express need for assistance with ADLs?: Yes Does the patient have difficulty dressing or bathing?: No Independently performs ADLs?: Yes (appropriate for developmental age) Does the patient have difficulty walking or climbing stairs?: No Weakness of Legs: None Weakness of Arms/Hands: None  Permission Sought/Granted                   Emotional Assessment           Psych Involvement: No (comment)  Admission diagnosis:  Cellulitis of buttock [L03.317] DKA (diabetic ketoacidoses) (HCC) [E11.10] Yeast infection [B37.9] Diabetic ketoacidosis without coma associated with type 1 diabetes mellitus (HCC) [E10.10] Patient Active Problem List   Diagnosis Date Noted  . Abnormal liver function 06/01/2017  . Diabetic ketoacidosis (HCC) 12/25/2016  . Transaminitis 12/24/2016  . Essential hypertension 08/04/2015  . Status post repeat low transverse cesarean section 06/25/2015  . Intractable nausea and vomiting 02/10/2015  . Marijuana use 01/08/2015  . Dehydration with hyponatremia 11/16/2014  . HLD (hyperlipidemia)   . Type 2 diabetes mellitus with hyperglycemia (HCC)   . DKA (diabetic ketoacidoses) (HCC) 09/10/2014   PCP:  Patient, No Pcp Per Pharmacy:   [facility] [phone] - [place], [place] - [address] [place] [address] Phone: [phone] Fax: [phone]  [facility] - [place], [place] - [place] [address] [place] [address] Phone: [phone] Fax: [phone]     Social Determinants of Health (SDOH) Interventions    Readmission Risk Interventions No flowsheet data found.

## 2019-11-13 LAB — CULTURE, BLOOD (ROUTINE X 2)
Culture: NO GROWTH
Culture: NO GROWTH
Special Requests: ADEQUATE

## 2019-11-21 ENCOUNTER — Other Ambulatory Visit: Payer: Self-pay

## 2019-11-21 ENCOUNTER — Encounter (INDEPENDENT_AMBULATORY_CARE_PROVIDER_SITE_OTHER): Payer: Self-pay | Admitting: Primary Care

## 2019-11-21 ENCOUNTER — Ambulatory Visit (INDEPENDENT_AMBULATORY_CARE_PROVIDER_SITE_OTHER): Payer: Self-pay | Admitting: Primary Care

## 2019-11-21 VITALS — BP 130/84 | HR 105 | Temp 98.3°F | Ht 62.0 in | Wt 172.8 lb

## 2019-11-21 DIAGNOSIS — E785 Hyperlipidemia, unspecified: Secondary | ICD-10-CM

## 2019-11-21 DIAGNOSIS — Z09 Encounter for follow-up examination after completed treatment for conditions other than malignant neoplasm: Secondary | ICD-10-CM

## 2019-11-21 DIAGNOSIS — Z794 Long term (current) use of insulin: Secondary | ICD-10-CM

## 2019-11-21 DIAGNOSIS — E1165 Type 2 diabetes mellitus with hyperglycemia: Secondary | ICD-10-CM

## 2019-11-21 DIAGNOSIS — I10 Essential (primary) hypertension: Secondary | ICD-10-CM

## 2019-11-21 LAB — GLUCOSE, POCT (MANUAL RESULT ENTRY): POC Glucose: 324 mg/dl — AB (ref 70–99)

## 2019-11-21 MED ORDER — GLIMEPIRIDE 2 MG PO TABS: 2.0000 mg | ORAL_TABLET | Freq: Every day | ORAL | 3 refills | Status: DC

## 2019-11-21 MED ORDER — BLOOD GLUCOSE MONITOR KIT: PACK | 0 refills | Status: DC

## 2019-11-21 MED ORDER — TRUE METRIX GO GLUCOSE METER W/DEVICE KIT: 1.0000 | PACK | Freq: Once | 0 refills | Status: AC

## 2019-11-21 MED ORDER — NOVOLIN 70/30 (70-30) 100 UNIT/ML ~~LOC~~ SUSP: 21.0000 [IU] | Freq: Two times a day (BID) | SUBCUTANEOUS | 0 refills | Status: DC

## 2019-11-21 MED ORDER — LISINOPRIL 5 MG PO TABS: 5.0000 mg | ORAL_TABLET | Freq: Every day | ORAL | 3 refills | Status: DC

## 2019-11-21 NOTE — Patient Instructions (Addendum)
ADDED AMARYL 2MG  DAILY   Diabetes Mellitus and Nutrition, Adult When you have diabetes (diabetes mellitus), it is very important to have healthy eating habits because your blood sugar (glucose) levels are greatly affected by what you eat and drink. Eating healthy foods in the appropriate amounts, at about the same times every day, can help you:  Control your blood glucose.  Lower your risk of heart disease.  Improve your blood pressure.  Reach or maintain a healthy weight. Every person with diabetes is different, and each person has different needs for a meal plan. Your health care provider may recommend that you work with a diet and nutrition specialist (dietitian) to make a meal plan that is best for you. Your meal plan may vary depending on factors such as:  The calories you need.  The medicines you take.  Your weight.  Your blood glucose, blood pressure, and cholesterol levels.  Your activity level.  Other health conditions you have, such as heart or kidney disease. How do carbohydrates affect me? Carbohydrates, also called carbs, affect your blood glucose level more than any other type of food. Eating carbs naturally raises the amount of glucose in your blood. Carb counting is a method for keeping track of how many carbs you eat. Counting carbs is important to keep your blood glucose at a healthy level, especially if you use insulin or take certain oral diabetes medicines. It is important to know how many carbs you can safely have in each meal. This is different for every person. Your dietitian can help you calculate how many carbs you should have at each meal and for each snack. Foods that contain carbs include:  Bread, cereal, rice, pasta, and crackers.  Potatoes and corn.  Peas, beans, and lentils.  Milk and yogurt.  Fruit and juice.  Desserts, such as cakes, cookies, ice cream, and candy. How does alcohol affect me? Alcohol can cause a sudden decrease in blood  glucose (hypoglycemia), especially if you use insulin or take certain oral diabetes medicines. Hypoglycemia can be a life-threatening condition. Symptoms of hypoglycemia (sleepiness, dizziness, and confusion) are similar to symptoms of having too much alcohol. If your health care provider says that alcohol is safe for you, follow these guidelines:  Limit alcohol intake to no more than 1 drink per day for nonpregnant women and 2 drinks per day for men. One drink equals 12 oz of beer, 5 oz of wine, or 1 oz of hard liquor.  Do not drink on an empty stomach.  Keep yourself hydrated with water, diet soda, or unsweetened iced tea.  Keep in mind that regular soda, juice, and other mixers may contain a lot of sugar and must be counted as carbs. What are tips for following this plan?  Reading food labels  Start by checking the serving size on the "Nutrition Facts" label of packaged foods and drinks. The amount of calories, carbs, fats, and other nutrients listed on the label is based on one serving of the item. Many items contain more than one serving per package.  Check the total grams (g) of carbs in one serving. You can calculate the number of servings of carbs in one serving by dividing the total carbs by 15. For example, if a food has 30 g of total carbs, it would be equal to 2 servings of carbs.  Check the number of grams (g) of saturated and trans fats in one serving. Choose foods that have low or no amount of these  fats.  Check the number of milligrams (mg) of salt (sodium) in one serving. Most people should limit total sodium intake to less than 2,300 mg per day.  Always check the nutrition information of foods labeled as "low-fat" or "nonfat". These foods may be higher in added sugar or refined carbs and should be avoided.  Talk to your dietitian to identify your daily goals for nutrients listed on the label. Shopping  Avoid buying canned, premade, or processed foods. These foods tend to  be high in fat, sodium, and added sugar.  Shop around the outside edge of the grocery store. This includes fresh fruits and vegetables, bulk grains, fresh meats, and fresh dairy. Cooking  Use low-heat cooking methods, such as baking, instead of high-heat cooking methods like deep frying.  Cook using healthy oils, such as olive, canola, or sunflower oil.  Avoid cooking with butter, cream, or high-fat meats. Meal planning  Eat meals and snacks regularly, preferably at the same times every day. Avoid going long periods of time without eating.  Eat foods high in fiber, such as fresh fruits, vegetables, beans, and whole grains. Talk to your dietitian about how many servings of carbs you can eat at each meal.  Eat 4-6 ounces (oz) of lean protein each day, such as lean meat, chicken, fish, eggs, or tofu. One oz of lean protein is equal to: ? 1 oz of meat, chicken, or fish. ? 1 egg. ?  cup of tofu.  Eat some foods each day that contain healthy fats, such as avocado, nuts, seeds, and fish. Lifestyle  Check your blood glucose regularly.  Exercise regularly as told by your health care provider. This may include: ? 150 minutes of moderate-intensity or vigorous-intensity exercise each week. This could be brisk walking, biking, or water aerobics. ? Stretching and doing strength exercises, such as yoga or weightlifting, at least 2 times a week.  Take medicines as told by your health care provider.  Do not use any products that contain nicotine or tobacco, such as cigarettes and e-cigarettes. If you need help quitting, ask your health care provider.  Work with a Social worker or diabetes educator to identify strategies to manage stress and any emotional and social challenges. Questions to ask a health care provider  Do I need to meet with a diabetes educator?  Do I need to meet with a dietitian?  What number can I call if I have questions?  When are the best times to check my blood  glucose? Where to find more information:  American Diabetes Association: diabetes.org  Academy of Nutrition and Dietetics: www.eatright.[employer] of Diabetes and Digestive and Kidney Diseases (NIH): [employer] Summary  A healthy meal plan will help you control your blood glucose and maintain a healthy lifestyle.  Working with a diet and nutrition specialist (dietitian) can help you make a meal plan that is best for you.  Keep in mind that carbohydrates (carbs) and alcohol have immediate effects on your blood glucose levels. It is important to count carbs and to use alcohol carefully. This information is not intended to replace advice given to you by your health care provider. Make sure you discuss any questions you have with your health care provider. Document Revised: 03/25/2017 Document Reviewed: 05/17/2016 Elsevier Patient Education  2020 [employer].

## 2019-11-21 NOTE — Progress Notes (Signed)
Assessment and Plan: Cheryl Parker was seen today for hospitalization follow-up.  Diagnoses and all orders for this visit:  Type 2 diabetes mellitus with hyperglycemia, with long-term current use of insulin (HCC)  Goal of therapy: Less than 6.5 hemoglobin A1c.  Reference clinical practice recommendations. Foods that are high in carbohydrates are the following rice, potatoes, breads, sugars, and pastas.  Reduction in the intake (eating) will assist in lowering your blood sugars. -     Glucose (CBG) -     Microalbumin/Creatinine Ratio, Urine -     Ambulatory referral to Ophthalmology  Hospital discharge follow-up   Ms.Cheryl Parker is a  38 y.o. obese female presents for follow up from the hospital. Admit date to the hospital was 11/08/19, patient was discharged from the hospital on 11/11/19, patient was admitted for: Diabetic ketoacidosis, hyperlipidemia and essential hypertension  Essential hypertension Counseled on blood pressure goal of less than 130/80, low-sodium, DASH diet, medication compliance, 150 minutes of moderate intensity exercise per week.  Hyperlipidemia, unspecified hyperlipidemia type -     Lipid panel; Future  Other orders -     glimepiride (AMARYL) 2 MG tablet; Take 1 tablet (2 mg total) by mouth daily before breakfast. -     insulin NPH-regular Human (NOVOLIN 70/30) (70-30) 100 UNIT/ML injection; Inject 21 Units into the skin 2 (two) times daily with a meal. Follow up with PCP for adjustments to insulin regimen -     blood glucose meter kit and supplies KIT; Dispense based on patient and insurance preference. Use up to four times daily as directed. (FOR ICD-9 250.00, 250.01). -     lisinopril (ZESTRIL) 5 MG tablet; Take 1 tablet (5 mg total) by mouth daily. -     Blood Glucose Monitoring Suppl (TRUE METRIX GO GLUCOSE METER) w/Device KIT; 1 Bag by Does not apply route once for 1 dose.  Past Medical History:  Diagnosis Date  . DKA (diabetic ketoacidoses) (HCC) 09/10/2014;  06/02/2017  . GERD (gastroesophageal reflux disease)   . Gestational diabetes   . Headache   . Hypertension   . Nausea & vomiting 07/2017  . Pregnancy induced hypertension   . Type 1 diabetes mellitus (HCC)    Insulin dependent     No Known Allergies    Current Outpatient Medications on File Prior to Visit  Medication Sig Dispense Refill  . amoxicillin-clavulanate (AUGMENTIN) 250-62.5 MG/5ML suspension Take 5 mLs (250 mg total) by mouth 2 (two) times daily for 10 days. 100 mL 0  . blood glucose meter kit and supplies KIT Dispense based on patient and insurance preference. Use up to four times daily as directed. (FOR ICD-9 250.00, 250.01). 1 each 0  . doxycycline (DORYX) 100 MG EC tablet Take 1 tablet (100 mg total) by mouth 2 (two) times daily for 10 days. 20 tablet 0  . ibuprofen (ADVIL) 400 MG tablet Take 1 tablet (400 mg total) by mouth every 6 (six) hours as needed for fever or moderate pain. 30 tablet 0  . insulin NPH-regular Human (NOVOLIN 70/30) (70-30) 100 UNIT/ML injection Inject 21 Units into the skin 2 (two) times daily with a meal. Follow up with PCP for adjustments to insulin regimen 10 mL 0  . acetaminophen (TYLENOL) 500 MG tablet Take 1,000 mg by mouth every 6 (six) hours as needed for mild pain.     No current facility-administered medications on file prior to visit.    ROS: all negative except above.   Physical Exam: Filed Weights     11/21/19 1539  Weight: 172 lb 12.8 oz (78.4 kg)   BP (!) 130/84 (BP Location: Right Arm, Patient Position: Sitting, Cuff Size: Normal)   Pulse 105   Temp 98.3 F (36.8 C) (Oral)   Ht 5' 2" (1.575 m)   Wt 172 lb 12.8 oz (78.4 kg)   LMP 09/25/2019   SpO2 98%   BMI 31.61 kg/m  General Appearance: Well nourished,obese female  in no apparent distress. Eyes: PERRLA, EOMs, conjunctiva no swelling or erythema Sinuses: No Frontal/maxillary tenderness ENT/TMs without erythema, bulging. Hearing normal.  Neck: Supple, thyroid normal.   Respiratory: Respiratory effort normal, BS equal bilaterally without rales, rhonchi, wheezing or stridor.  Cardio: RRR with no MRGs. Brisk peripheral pulses without edema.  Abdomen: Soft, + BS.  Non tender, no guarding, rebound, hernias, masses. Lymphatics: Non tender without lymphadenopathy.  Musculoskeletal: Full ROM, 5/5 strength, normal gait.  Skin: Warm, dry without rashes, lesions, ecchymosis.  Neuro: Cranial nerves intact. Normal muscle tone, no cerebellar symptoms. Sensation intact.  Psych: Awake and oriented X 3, normal affect, Insight and Judgment appropriate.      P , NP    

## 2019-11-22 LAB — MICROALBUMIN / CREATININE URINE RATIO
Creatinine, Urine: 53.3 mg/dL
Microalb/Creat Ratio: 19 mg/g creat (ref 0–29)
Microalbumin, Urine: 10.1 ug/mL

## 2019-11-23 ENCOUNTER — Telehealth (INDEPENDENT_AMBULATORY_CARE_PROVIDER_SITE_OTHER): Payer: Self-pay

## 2019-11-23 NOTE — Telephone Encounter (Signed)
-----   Message from Grayce Sessions, NP sent at 11/23/2019 12:19 PM EDT ----- Microalbumin is normal

## 2019-11-23 NOTE — Telephone Encounter (Signed)
Patient verified date of birth. She is aware of normal microalbumin. She verbalized understanding. She needs test strips sent to [facility] for true metrix glucometer. Maryjean Morn, CMA

## 2019-11-29 ENCOUNTER — Other Ambulatory Visit (INDEPENDENT_AMBULATORY_CARE_PROVIDER_SITE_OTHER): Payer: Self-pay

## 2019-12-17 ENCOUNTER — Other Ambulatory Visit (INDEPENDENT_AMBULATORY_CARE_PROVIDER_SITE_OTHER): Payer: Self-pay

## 2019-12-17 ENCOUNTER — Other Ambulatory Visit: Payer: Self-pay

## 2019-12-17 DIAGNOSIS — E785 Hyperlipidemia, unspecified: Secondary | ICD-10-CM

## 2019-12-18 LAB — LIPID PANEL
Chol/HDL Ratio: 4.3 ratio (ref 0.0–4.4)
Cholesterol, Total: 213 mg/dL — ABNORMAL HIGH (ref 100–199)
HDL: 50 mg/dL (ref >39)
LDL Chol Calc (NIH): 149 mg/dL — ABNORMAL HIGH (ref 0–99)
Triglycerides: 79 mg/dL (ref 0–149)
VLDL Cholesterol Cal: 14 mg/dL (ref 5–40)

## 2019-12-19 ENCOUNTER — Other Ambulatory Visit (INDEPENDENT_AMBULATORY_CARE_PROVIDER_SITE_OTHER): Payer: Self-pay | Admitting: Primary Care

## 2019-12-19 DIAGNOSIS — E785 Hyperlipidemia, unspecified: Secondary | ICD-10-CM

## 2019-12-19 MED ORDER — ATORVASTATIN CALCIUM 20 MG PO TABS: 20.0000 mg | ORAL_TABLET | Freq: Every day | ORAL | 3 refills | Status: DC

## 2020-01-28 ENCOUNTER — Other Ambulatory Visit (INDEPENDENT_AMBULATORY_CARE_PROVIDER_SITE_OTHER): Payer: Self-pay | Admitting: Primary Care

## 2020-01-29 ENCOUNTER — Other Ambulatory Visit (INDEPENDENT_AMBULATORY_CARE_PROVIDER_SITE_OTHER): Payer: Self-pay | Admitting: Primary Care

## 2020-01-29 MED ORDER — NOVOLIN 70/30 (70-30) 100 UNIT/ML ~~LOC~~ SUSP: 21.0000 [IU] | Freq: Two times a day (BID) | SUBCUTANEOUS | 0 refills | Status: DC

## 2020-02-07 ENCOUNTER — Other Ambulatory Visit (INDEPENDENT_AMBULATORY_CARE_PROVIDER_SITE_OTHER): Payer: Self-pay | Admitting: Primary Care

## 2020-02-07 ENCOUNTER — Other Ambulatory Visit: Payer: Self-pay

## 2020-02-07 ENCOUNTER — Ambulatory Visit (INDEPENDENT_AMBULATORY_CARE_PROVIDER_SITE_OTHER): Payer: Medicaid Other | Admitting: Primary Care

## 2020-02-07 ENCOUNTER — Encounter (INDEPENDENT_AMBULATORY_CARE_PROVIDER_SITE_OTHER): Payer: Self-pay | Admitting: Primary Care

## 2020-02-07 VITALS — BP 138/83 | HR 85 | Temp 97.3°F | Ht 62.0 in | Wt 173.8 lb

## 2020-02-07 DIAGNOSIS — Z760 Encounter for issue of repeat prescription: Secondary | ICD-10-CM

## 2020-02-07 DIAGNOSIS — I10 Essential (primary) hypertension: Secondary | ICD-10-CM

## 2020-02-07 DIAGNOSIS — Z23 Encounter for immunization: Secondary | ICD-10-CM | POA: Diagnosis not present

## 2020-02-07 DIAGNOSIS — E1165 Type 2 diabetes mellitus with hyperglycemia: Secondary | ICD-10-CM

## 2020-02-07 DIAGNOSIS — Z794 Long term (current) use of insulin: Secondary | ICD-10-CM | POA: Diagnosis not present

## 2020-02-07 LAB — GLUCOSE, POCT (MANUAL RESULT ENTRY): POC Glucose: 258 mg/dl — AB (ref 70–99)

## 2020-02-07 LAB — POCT GLYCOSYLATED HEMOGLOBIN (HGB A1C): Hemoglobin A1C: 9.6 % — AB (ref 4.0–5.6)

## 2020-02-07 MED ORDER — NOVOLIN 70/30 (70-30) 100 UNIT/ML ~~LOC~~ SUSP: 24.0000 [IU] | Freq: Two times a day (BID) | SUBCUTANEOUS | 6 refills | Status: DC

## 2020-02-07 MED ORDER — GLIMEPIRIDE 4 MG PO TABS: 4.0000 mg | ORAL_TABLET | Freq: Every day | ORAL | 1 refills | Status: DC

## 2020-02-07 MED ORDER — LISINOPRIL 5 MG PO TABS: 5.0000 mg | ORAL_TABLET | Freq: Every day | ORAL | 3 refills | Status: DC

## 2020-02-07 MED ORDER — GLIMEPIRIDE 2 MG PO TABS: 2.0000 mg | ORAL_TABLET | Freq: Every day | ORAL | 1 refills | Status: DC

## 2020-02-07 MED ORDER — TRUE METRIX BLOOD GLUCOSE TEST VI STRP: ORAL_STRIP | 12 refills | Status: DC

## 2020-02-07 MED ORDER — NOVOLIN 70/30 (70-30) 100 UNIT/ML ~~LOC~~ SUSP: 21.0000 [IU] | Freq: Two times a day (BID) | SUBCUTANEOUS | 6 refills | Status: DC

## 2020-02-07 NOTE — Progress Notes (Signed)
Established Patient Office Visit  Subjective:  Patient ID: Cheryl Parker, female    DOB: February 21, 1981  Age: 39 y.o. MRN: [id number]  CC:  Chief Complaint  Patient presents with  . Diabetes  . Medication Refill    true metrix test strips     HPI Cheryl Parker is a 39 year old obese female presents for management of diabetes and requesting refills for her diabetic supplies.  She denies polyuria polydipsia and polyphagia  Past Medical History:  Diagnosis Date  . DKA (diabetic ketoacidoses) 09/10/2014; 06/02/2017  . GERD (gastroesophageal reflux disease)   . Gestational diabetes   . Headache   . Hypertension   . Nausea & vomiting 07/2017  . Pregnancy induced hypertension   . Type 1 diabetes mellitus (HCC)    Insulin dependent    Past Surgical History:  Procedure Laterality Date  . CESAREAN SECTION  2011  . CESAREAN SECTION N/A 07/05/2012   Procedure: CESAREAN SECTION;  Surgeon: Mora Bellman, MD;  Location: [facility];  Service: Obstetrics;  Laterality: N/A;  . CESAREAN SECTION N/A 06/25/2015   Procedure: CESAREAN SECTION;  Surgeon: Mora Bellman, MD;  Location: [facility];  Service: Obstetrics;  Laterality: N/A;  . ESOPHAGOGASTRODUODENOSCOPY (EGD) WITH PROPOFOL Left 12/28/2016   Procedure: ESOPHAGOGASTRODUODENOSCOPY (EGD) WITH PROPOFOL;  Surgeon: Ronnette Juniper, MD;  Location: [facility];  Service: Gastroenterology;  Laterality: Left;    Family History  Problem Relation Age of Onset  . Diabetes Mother   . Kidney disease Father   . Birth defects Son        unilateral renal agenesis    Social History   Socioeconomic History  . Marital status: Single    Spouse name: Not on file  . Number of children: Not on file  . Years of education: Not on file  . Highest education level: Not on file  Occupational History  . Not on file  Tobacco Use  . Smoking status: Former Smoker    Packs/day: 0.10    Years: 17.00    Pack years: 1.70    Types: Cigarettes  . Smokeless tobacco:  Never Used  . Tobacco comment: 06/02/2017 "quit ~ 6 months ago"  Vaping Use  . Vaping Use: Never used  Substance and Sexual Activity  . Alcohol use: No  . Drug use: No  . Sexual activity: Not on file  Other Topics Concern  . Not on file  Social History Narrative  . Not on file   Social Determinants of Health   Financial Resource Strain:   . Difficulty of Paying Living Expenses: Not on file  Food Insecurity:   . Worried About Charity fundraiser in the Last Year: Not on file  . Ran Out of Food in the Last Year: Not on file  Transportation Needs:   . Lack of Transportation (Medical): Not on file  . Lack of Transportation (Non-Medical): Not on file  Physical Activity:   . Days of Exercise per Week: Not on file  . Minutes of Exercise per Session: Not on file  Stress:   . Feeling of Stress : Not on file  Social Connections:   . Frequency of Communication with Friends and Family: Not on file  . Frequency of Social Gatherings with Friends and Family: Not on file  . Attends Religious Services: Not on file  . Active Member of Clubs or Organizations: Not on file  . Attends Archivist Meetings: Not on file  . Marital Status: Not on file  Intimate  Partner Violence:   . Fear of Current or Ex-Partner: Not on file  . Emotionally Abused: Not on file  . Physically Abused: Not on file  . Sexually Abused: Not on file    Outpatient Medications Prior to Visit  Medication Sig Dispense Refill  . acetaminophen (TYLENOL) 500 MG tablet Take 1,000 mg by mouth every 6 (six) hours as needed for mild pain.    Marland Kitchen atorvastatin (LIPITOR) 20 MG tablet Take 1 tablet (20 mg total) by mouth daily. 90 tablet 3  . blood glucose meter kit and supplies KIT Dispense based on patient and insurance preference. Use up to four times daily as directed. (FOR ICD-9 250.00, 250.01). 1 each 0  . ibuprofen (ADVIL) 400 MG tablet Take 1 tablet (400 mg total) by mouth every 6 (six) hours as needed for fever or  moderate pain. 30 tablet 0  . glimepiride (AMARYL) 2 MG tablet Take 1 tablet (2 mg total) by mouth daily before breakfast. 30 tablet 3  . insulin NPH-regular Human (NOVOLIN 70/30) (70-30) 100 UNIT/ML injection Inject 21 Units into the skin 2 (two) times daily with a meal. Follow up with PCP for adjustments to insulin regimen NEEDS APPOINTMENT 10 mL 0  . lisinopril (ZESTRIL) 5 MG tablet Take 1 tablet (5 mg total) by mouth daily. 90 tablet 3   No facility-administered medications prior to visit.    No Known Allergies  ROS Review of Systems  All other systems reviewed and are negative.     Objective:    Physical Exam Vitals reviewed.  Constitutional:      Appearance: She is obese.  HENT:     Right Ear: Tympanic membrane normal.     Left Ear: Tympanic membrane normal.     Nose: Nose normal.  Cardiovascular:     Rate and Rhythm: Normal rate and regular rhythm.  Pulmonary:     Effort: Pulmonary effort is normal.  Abdominal:     General: Bowel sounds are normal. There is distension.     Palpations: Abdomen is soft.  Musculoskeletal:     Cervical back: Normal range of motion and neck supple.  Skin:    General: Skin is warm and dry.  Neurological:     Mental Status: She is alert and oriented to person, place, and time.  Psychiatric:        Mood and Affect: Mood normal.        Behavior: Behavior normal.        Thought Content: Thought content normal.        Judgment: Judgment normal.     BP 138/83 (BP Location: Right Arm, Patient Position: Sitting, Cuff Size: Normal)   Pulse 85   Temp (!) 97.3 F (36.3 C) (Temporal)   Ht $R'5\' 2"'Et$  (1.575 m)   Wt 173 lb 12.8 oz (78.8 kg)   LMP 02/04/2020 (Exact Date)   SpO2 99%   BMI 31.79 kg/m  Wt Readings from Last 3 Encounters:  02/07/20 173 lb 12.8 oz (78.8 kg)  11/21/19 172 lb 12.8 oz (78.4 kg)  11/08/19 158 lb 4.8 oz (71.8 kg)     Health Maintenance Due  Topic Date Due  . OPHTHALMOLOGY EXAM  Never done  . COVID-19 Vaccine  (1) Never done  . PAP SMEAR-Modifier  12/22/2017    There are no preventive care reminders to display for this patient.  Lab Results  Component Value Date   TSH 0.509 11/16/2014   Lab Results  Component Value Date  WBC 8.4 11/11/2019   HGB 11.6 (L) 11/11/2019   HCT 35.5 (L) 11/11/2019   MCV 95.9 11/11/2019   PLT 379 11/11/2019   Lab Results  Component Value Date   NA 136 11/11/2019   K 3.8 11/11/2019   CO2 27 11/11/2019   GLUCOSE 237 (H) 11/11/2019   BUN 9 11/11/2019   CREATININE 0.57 11/11/2019   BILITOT 0.5 11/11/2019   ALKPHOS 138 (H) 11/11/2019   AST 43 (H) 11/11/2019   ALT 57 (H) 11/11/2019   PROT 5.9 (L) 11/11/2019   ALBUMIN 2.9 (L) 11/11/2019   CALCIUM 8.7 (L) 11/11/2019   ANIONGAP 8 11/11/2019   Lab Results  Component Value Date   CHOL 213 (H) 12/17/2019   Lab Results  Component Value Date   HDL 50 12/17/2019   Lab Results  Component Value Date   LDLCALC 149 (H) 12/17/2019   Lab Results  Component Value Date   TRIG 79 12/17/2019   Lab Results  Component Value Date   CHOLHDL 4.3 12/17/2019   Lab Results  Component Value Date   HGBA1C 9.6 (A) 02/07/2020      Assessment & Plan:  Adaiah was seen today for diabetes and medication refill.  Diagnoses and all orders for this visit:  Type 2 diabetes mellitus with hyperglycemia, with long-term current use of insulin (HCC) Slight improvement in A1c in 3 months previously 10.7 today 9.6 will adjust medications.  Counseled on diet modification and restrictions and exercising 30 minutes daily or 150 minutes weekly Added Amaryl 4 mg daily , 70/30 insulin 24 units twice daily -     HgB A1c -     Glucose (CBG)  -  Need for immunization against influenza CDC recommends influenza vaccine yearly.   -     Flu Vaccine QUAD 36+ mos IM  Essential hypertension Counseled on blood pressure goal of less than 130/80, low-sodium, DASH diet, medication compliance, 150 minutes of moderate intensity exercise per  week. Renal protection per ADA guidelines on an ACE inhibitor lisinopril 5 mg daily  Medication refill -     Discontinue: glimepiride (AMARYL) 2 MG tablet; Take 1 tablet (2 mg total) by mouth daily before breakfast. -     Discontinue: insulin NPH-regular Human (NOVOLIN 70/30) (70-30) 100 UNIT/ML injection; Inject 21 Units into the skin 2 (two) times daily with a meal. Follow up with PCP for adjustments to insulin regimen -     glimepiride (AMARYL) 4 MG tablet; Take 1 tablet (4 mg total) by mouth daily before breakfast. -     insulin NPH-regular Human (NOVOLIN 70/30) (70-30) 100 UNIT/ML injection; Inject 24 Units into the skin 2 (two) times daily with a meal. Follow up with PCP for adjustments to insulin regimen  Other orders -     glucose blood (TRUE METRIX BLOOD GLUCOSE TEST) test strip; Use as instructed   Meds ordered this encounter  Medications  . DISCONTD: glimepiride (AMARYL) 2 MG tablet    Sig: Take 1 tablet (2 mg total) by mouth daily before breakfast.    Dispense:  90 tablet    Refill:  1  . DISCONTD: insulin NPH-regular Human (NOVOLIN 70/30) (70-30) 100 UNIT/ML injection    Sig: Inject 21 Units into the skin 2 (two) times daily with a meal. Follow up with PCP for adjustments to insulin regimen    Dispense:  10 mL    Refill:  6  . lisinopril (ZESTRIL) 5 MG tablet    Sig: Take 1 tablet (  5 mg total) by mouth daily.    Dispense:  90 tablet    Refill:  3  . glucose blood (TRUE METRIX BLOOD GLUCOSE TEST) test strip    Sig: Use as instructed    Dispense:  100 each    Refill:  12  . glimepiride (AMARYL) 4 MG tablet    Sig: Take 1 tablet (4 mg total) by mouth daily before breakfast.    Dispense:  90 tablet    Refill:  1  . insulin NPH-regular Human (NOVOLIN 70/30) (70-30) 100 UNIT/ML injection    Sig: Inject 24 Units into the skin 2 (two) times daily with a meal. Follow up with PCP for adjustments to insulin regimen    Dispense:  10 mL    Refill:  6    Follow-up: Return in 3  months (on 05/09/2020) for DM  and schedule pap at her convience .    Kerin Perna, NP

## 2020-02-07 NOTE — Patient Instructions (Addendum)
Medication changes Take 2 Amaryl 2 mg pills daily until the bottle is complete.  A new prescription has been sent in for Amaryl 4 mg take 1 tablet daily.  Increased insulin 70/30 to 24 units twice daily. Influenza, Adult Influenza is also called "the flu." It is an infection in the lungs, nose, and throat (respiratory tract). It is caused by a virus. The flu causes symptoms that are similar to symptoms of a cold. It also causes a high fever and body aches. The flu spreads easily from person to person (is contagious). Getting a flu shot (influenza vaccination) every year is the best way to prevent the flu. What are the causes? This condition is caused by the influenza virus. You can get the virus by:  Breathing in droplets that are in the air from the cough or sneeze of a person who has the virus.  Touching something that has the virus on it (is contaminated) and then touching your mouth, nose, or eyes. What increases the risk? Certain things may make you more likely to get the flu. These include:  Not washing your hands often.  Having close contact with many people during cold and flu season.  Touching your mouth, eyes, or nose without first washing your hands.  Not getting a flu shot every year. You may have a higher risk for the flu, along with serious problems such as a lung infection (pneumonia), if you:  Are older than 65.  Are pregnant.  Have a weakened disease-fighting system (immune system) because of a disease or taking certain medicines.  Have a long-term (chronic) illness, such as: ? Heart, kidney, or lung disease. ? Diabetes. ? Asthma.  Have a liver disorder.  Are very overweight (morbidly obese).  Have anemia. This is a condition that affects your red blood cells. What are the signs or symptoms? Symptoms usually begin suddenly and last 4-14 days. They may include:  Fever and chills.  Headaches, body aches, or muscle aches.  Sore throat.  Cough.  Runny or  stuffy (congested) nose.  Chest discomfort.  Not wanting to eat as much as normal (poor appetite).  Weakness or feeling tired (fatigue).  Dizziness.  Feeling sick to your stomach (nauseous) or throwing up (vomiting). How is this treated? If the flu is found early, you can be treated with medicine that can help reduce how bad the illness is and how long it lasts (antiviral medicine). This may be given by mouth (orally) or through an IV tube. Taking care of yourself at home can help your symptoms get better. Your doctor may suggest:  Taking over-the-counter medicines.  Drinking plenty of fluids. The flu often goes away on its own. If you have very bad symptoms or other problems, you may be treated in a hospital. Follow these instructions at home:     Activity  Rest as needed. Get plenty of sleep.  Stay home from work or school as told by your doctor. ? Do not leave home until you do not have a fever for 24 hours without taking medicine. ? Leave home only to visit your doctor. Eating and drinking  Take an ORS (oral rehydration solution). This is a drink that is sold at pharmacies and stores.  Drink enough fluid to keep your pee (urine) pale yellow.  Drink clear fluids in small amounts as you are able. Clear fluids include: ? Water. ? Ice chips. ? Fruit juice that has water added (diluted fruit juice). ? Low-calorie sports drinks.  Eat bland, easy-to-digest foods in small amounts as you are able. These foods include: ? Bananas. ? Applesauce. ? Rice. ? Lean meats. ? Toast. ? Crackers.  Do not eat or drink: ? Fluids that have a lot of sugar or caffeine. ? Alcohol. ? Spicy or fatty foods. General instructions  Take over-the-counter and prescription medicines only as told by your doctor.  Use a cool mist humidifier to add moisture to the air in your home. This can make it easier for you to breathe.  Cover your mouth and nose when you cough or sneeze.  Wash your  hands with soap and water often, especially after you cough or sneeze. If you cannot use soap and water, use alcohol-based hand sanitizer.  Keep all follow-up visits as told by your doctor. This is important. How is this prevented?   Get a flu shot every year. You may get the flu shot in late summer, fall, or winter. Ask your doctor when you should get your flu shot.  Avoid contact with people who are sick during fall and winter (cold and flu season). Contact a doctor if:  You get new symptoms.  You have: ? Chest pain. ? Watery poop (diarrhea). ? A fever.  Your cough gets worse.  You start to have more mucus.  You feel sick to your stomach.  You throw up. Get help right away if you:  Have shortness of breath.  Have trouble breathing.  Have skin or nails that turn a bluish color.  Have very bad pain or stiffness in your neck.  Get a sudden headache.  Get sudden pain in your face or ear.  Cannot eat or drink without throwing up. Summary  Influenza ("the flu") is an infection in the lungs, nose, and throat. It is caused by a virus.  Take over-the-counter and prescription medicines only as told by your doctor.  Getting a flu shot every year is the best way to avoid getting the flu. This information is not intended to replace advice given to you by your health care provider. Make sure you discuss any questions you have with your health care provider. Document Revised: 09/28/2017 Document Reviewed: 09/28/2017 Elsevier Patient Education  2020 [employer].

## 2020-04-24 ENCOUNTER — Ambulatory Visit (INDEPENDENT_AMBULATORY_CARE_PROVIDER_SITE_OTHER): Payer: Medicaid Other | Admitting: Primary Care

## 2020-05-08 ENCOUNTER — Ambulatory Visit (INDEPENDENT_AMBULATORY_CARE_PROVIDER_SITE_OTHER): Payer: Medicaid Other | Admitting: Primary Care

## 2020-06-02 ENCOUNTER — Other Ambulatory Visit: Payer: Self-pay

## 2020-06-02 ENCOUNTER — Ambulatory Visit (INDEPENDENT_AMBULATORY_CARE_PROVIDER_SITE_OTHER): Payer: Medicaid Other | Admitting: Primary Care

## 2020-06-02 ENCOUNTER — Encounter (INDEPENDENT_AMBULATORY_CARE_PROVIDER_SITE_OTHER): Payer: Self-pay | Admitting: Primary Care

## 2020-06-02 VITALS — BP 138/85 | HR 106 | Temp 97.3°F | Ht 62.0 in | Wt 177.0 lb

## 2020-06-02 DIAGNOSIS — Z760 Encounter for issue of repeat prescription: Secondary | ICD-10-CM | POA: Diagnosis not present

## 2020-06-02 DIAGNOSIS — E1165 Type 2 diabetes mellitus with hyperglycemia: Secondary | ICD-10-CM

## 2020-06-02 DIAGNOSIS — I10 Essential (primary) hypertension: Secondary | ICD-10-CM

## 2020-06-02 DIAGNOSIS — Z794 Long term (current) use of insulin: Secondary | ICD-10-CM

## 2020-06-02 DIAGNOSIS — N898 Other specified noninflammatory disorders of vagina: Secondary | ICD-10-CM

## 2020-06-02 LAB — GLUCOSE, POCT (MANUAL RESULT ENTRY): POC Glucose: 388 mg/dl — AB (ref 70–99)

## 2020-06-02 LAB — POCT GLYCOSYLATED HEMOGLOBIN (HGB A1C): Hemoglobin A1C: 11.1 % — AB (ref 4.0–5.6)

## 2020-06-02 MED ORDER — GLIMEPIRIDE 4 MG PO TABS: 4.0000 mg | ORAL_TABLET | Freq: Every day | ORAL | 1 refills | Status: DC

## 2020-06-02 MED ORDER — LISINOPRIL 10 MG PO TABS: 10.0000 mg | ORAL_TABLET | Freq: Every day | ORAL | 3 refills | Status: DC

## 2020-06-02 MED ORDER — NOVOLIN 70/30 (70-30) 100 UNIT/ML ~~LOC~~ SUSP: 30.0000 [IU] | Freq: Two times a day (BID) | SUBCUTANEOUS | 6 refills | Status: DC

## 2020-06-02 MED ORDER — SITAGLIPTIN PHOSPHATE 100 MG PO TABS: 100.0000 mg | ORAL_TABLET | Freq: Every day | ORAL | 1 refills | Status: DC

## 2020-06-02 MED ORDER — FLUCONAZOLE 150 MG PO TABS: 150.0000 mg | ORAL_TABLET | Freq: Once | ORAL | 0 refills | Status: AC

## 2020-06-02 NOTE — Progress Notes (Signed)
Cheryl Parker is a 40 y.o. female who presents for an initial/follow up evaluation of Type 2 diabetes mellitus.  Current symptoms/problems include hyperglycemia, polydipsia, visual disturbances and tremors  and have been improving. Symptoms have been present for 4 months.  Current diabetic medications include oral agent (monotherapy): Amaryl 33m   oral agents (dual therapy) and insulin injections:70/30 24 units bid.  The patient was initially diagnosed with Type 2 diabetes mellitus based on the following criteria:  ADA . 11. Current Outpatient Medications on File Prior to Visit  Medication Sig Dispense Refill  . atorvastatin (LIPITOR) 20 MG tablet Take 1 tablet (20 mg total) by mouth daily. 90 tablet 3  . blood glucose meter kit and supplies KIT Dispense based on patient and insurance preference. Use up to four times daily as directed. (FOR ICD-9 250.00, 250.01). 1 each 0  . glucose blood (TRUE METRIX BLOOD GLUCOSE TEST) test strip Use as instructed 100 each 12  . insulin NPH-regular Human (NOVOLIN 70/30) (70-30) 100 UNIT/ML injection Inject 24 Units into the skin 2 (two) times daily with a meal. Follow up with PCP for adjustments to insulin regimen 10 mL 6  . lisinopril (ZESTRIL) 5 MG tablet Take 1 tablet (5 mg total) by mouth daily. 90 tablet 3  . glimepiride (AMARYL) 4 MG tablet Take 1 tablet (4 mg total) by mouth daily before breakfast. (Patient not taking: Reported on 06/02/2020) 90 tablet 1   No current facility-administered medications on file prior to visit.    Current monitoring regimen: home blood tests - 3 times daily Home blood sugar records: fasting range: 220-350 Any episodes of hypoglycemia? no  Known diabetic complications: cardiovascular disease Cardiovascular risk factors: diabetes mellitus, dyslipidemia, hypertension and obesity (BMI >= 30 kg/m2) Eye exam current (within one year): no Weight trend: stable Prior visit with CDE: yes -  Current diet: in general, a  "healthy" diet   monitor sweets  Current exercise: none Medication Compliance?  Yes   Is She on ACE inhibitor or angiotensin II receptor blocker?  Yes  lisinopril (generic)   Review of Systems  Eyes: Positive for blurred vision.  Genitourinary: Positive for frequency.       Vaginal discharge  All other systems reviewed and are negative.   Objective:    BP 138/85 (BP Location: Right Arm, Patient Position: Sitting, Cuff Size: Normal)   Pulse (!) 106   Temp (!) 97.3 F (36.3 C) (Temporal)   Ht '5\' 2"'  (1.575 m)   Wt 177 lb (80.3 kg)   LMP 04/26/2020 (Exact Date)   SpO2 96%   BMI 32.37 kg/m   Physical Exam Vitals reviewed.  Constitutional:      Appearance: Normal appearance. She is obese.  HENT:     Head: Normocephalic.     Right Ear: Tympanic membrane and external ear normal.     Left Ear: External ear normal.     Nose: Nose normal.  Cardiovascular:     Rate and Rhythm: Normal rate.  Pulmonary:     Effort: Pulmonary effort is normal.     Breath sounds: Normal breath sounds.  Musculoskeletal:        General: Normal range of motion.     Cervical back: Normal range of motion and neck supple.  Skin:    General: Skin is warm and dry.  Neurological:     Mental Status: She is alert and oriented to person, place, and time.  Psychiatric:  Mood and Affect: Mood normal.        Behavior: Behavior normal.        Thought Content: Thought content normal.        Judgment: Judgment normal.    Lab Review Glucose, Bld (mg/dL)  Date Value  11/11/2019 237 (H)  11/10/2019 282 (H)  11/09/2019 352 (H)   CO2 (mmol/L)  Date Value  11/11/2019 27  11/10/2019 22  11/09/2019 21 (L)   BUN (mg/dL)  Date Value  11/11/2019 9  11/10/2019 8  11/09/2019 7   Creat (mg/dL)  Date Value  08/04/2015 0.46 (L)  12/11/2014 0.57   Creatinine, Ser (mg/dL)  Date Value  11/11/2019 0.57  11/10/2019 0.49  11/09/2019 0.49      Assessment:    Diabetes Mellitus type II, under  poor control.   Cheryl Parker was seen today for diabetes and medication refill.  Diagnoses and all orders for this visit:  Type 2 diabetes mellitus with hyperglycemia, with long-term current use of insulin ([name]) Goals for glycemic control related to A1c < than 6.5 hemoglobin A1c.  Monitor/ decrease foods that are high in carbohydrates are the following rice, potatoes, breads, sugars, and pastas.  Reduction in the intake (eating) will assist in lowering your blood sugars. -     HgB A1c -     Glucose (CBG) -     glimepiride (AMARYL) 4 MG tablet; Take 1 tablet (4 mg total) by mouth daily before breakfast. -     insulin NPH-regular Human (NOVOLIN 70/30) (70-30) 100 UNIT/ML injection; Inject 30 Units into the skin 2 (two) times daily with a meal. Follow up with PCP for adjustments to insulin regimen -     Ambulatory referral to Ophthalmology  Essential hypertension Counseled on blood pressure goal of less than 130/80, low-sodium, DASH diet, medication compliance, 150 minutes of moderate intensity exercise per week. Discussed medication compliance, adverse effects. -     lisinopril (ZESTRIL) 10 MG tablet; Take 1 tablet (10 mg total) by mouth daily.  Medication refill -     glimepiride (AMARYL) 4 MG tablet; Take 1 tablet (4 mg total) by mouth daily before breakfast. -     insulin NPH-regular Human (NOVOLIN 70/30) (70-30) 100 UNIT/ML injection; Inject 30 Units into the skin 2 (two) times daily with a meal. Follow up with PCP for adjustments to insulin regimen  Vaginal discharge -     Cervicovaginal ancillary only -     fluconazole (DIFLUCAN) 150 MG tablet; Take 1 tablet (150 mg total) by mouth once for 1 dose.  Other orders -     sitaGLIPtin (JANUVIA) 100 MG tablet; Take 1 tablet (100 mg total) by mouth daily.      Plan:    1.  Rx changes: increase insulin 70/30 30 units bid and added januvia 146m and continue Amaryl 413mdaily 2.  Education: Reviewed 'ABCs' of diabetes management (respective  goals in parentheses):  A1C (<7), blood pressure (<130/80), and cholesterol (LDL <100). 3. Discussed pathophysiology of DM; difference between type 1 and type 2 DM. 4. CHO counting diet discussed.  Reviewed CHO amount in various foods and how to read nutrition labels.  Discussed recommended serving sizes.  5.  Recommend check BG 3  times a day 6.  Recommended increase physical activity - goal is 150 minutes per week 7. Follow up: 3 months

## 2020-06-02 NOTE — Patient Instructions (Signed)
Diabetes Mellitus and Nutrition, Adult When you have diabetes, or diabetes mellitus, it is very important to have healthy eating habits because your blood sugar (glucose) levels are greatly affected by what you eat and drink. Eating healthy foods in the right amounts, at about the same times every day, can help you:  Control your blood glucose.  Lower your risk of heart disease.  Improve your blood pressure.  Reach or maintain a healthy weight. What can affect my meal plan? Every person with diabetes is different, and each person has different needs for a meal plan. Your health care provider may recommend that you work with a dietitian to make a meal plan that is best for you. Your meal plan may vary depending on factors such as:  The calories you need.  The medicines you take.  Your weight.  Your blood glucose, blood pressure, and cholesterol levels.  Your activity level.  Other health conditions you have, such as heart or kidney disease. How do carbohydrates affect me? Carbohydrates, also called carbs, affect your blood glucose level more than any other type of food. Eating carbs naturally raises the amount of glucose in your blood. Carb counting is a method for keeping track of how many carbs you eat. Counting carbs is important to keep your blood glucose at a healthy level, especially if you use insulin or take certain oral diabetes medicines. It is important to know how many carbs you can safely have in each meal. This is different for every person. Your dietitian can help you calculate how many carbs you should have at each meal and for each snack. How does alcohol affect me? Alcohol can cause a sudden decrease in blood glucose (hypoglycemia), especially if you use insulin or take certain oral diabetes medicines. Hypoglycemia can be a life-threatening condition. Symptoms of hypoglycemia, such as sleepiness, dizziness, and confusion, are similar to symptoms of having too much  alcohol.  Do not drink alcohol if: ? Your health care provider tells you not to drink. ? You are pregnant, may be pregnant, or are planning to become pregnant.  If you drink alcohol: ? Do not drink on an empty stomach. ? Limit how much you use to:  0-1 drink a day for women.  0-2 drinks a day for men. ? Be aware of how much alcohol is in your drink. In the [place] one drink equals one 12 oz bottle of beer (355 mL), one 5 oz glass of wine (148 mL), or one 1 oz glass of hard liquor (44 mL). ? Keep yourself hydrated with water, diet soda, or unsweetened iced tea.  Keep in mind that regular soda, juice, and other mixers may contain a lot of sugar and must be counted as carbs. What are tips for following this plan? Reading food labels  Start by checking the serving size on the "Nutrition Facts" label of packaged foods and drinks. The amount of calories, carbs, fats, and other nutrients listed on the label is based on one serving of the item. Many items contain more than one serving per package.  Check the total grams (g) of carbs in one serving. You can calculate the number of servings of carbs in one serving by dividing the total carbs by 15. For example, if a food has 30 g of total carbs per serving, it would be equal to 2 servings of carbs.  Check the number of grams (g) of saturated fats and trans fats in one serving. Choose foods that have   a low amount or none of these fats.  Check the number of milligrams (mg) of salt (sodium) in one serving. Most people should limit total sodium intake to less than 2,300 mg per day.  Always check the nutrition information of foods labeled as "low-fat" or "nonfat." These foods may be higher in added sugar or refined carbs and should be avoided.  Talk to your dietitian to identify your daily goals for nutrients listed on the label. Shopping  Avoid buying canned, pre-made, or processed foods. These foods tend to be high in fat, sodium, and added  sugar.  Shop around the outside edge of the grocery store. This is where you will most often find fresh fruits and vegetables, bulk grains, fresh meats, and fresh dairy. Cooking  Use low-heat cooking methods, such as baking, instead of high-heat cooking methods like deep frying.  Cook using healthy oils, such as olive, canola, or sunflower oil.  Avoid cooking with butter, cream, or high-fat meats. Meal planning  Eat meals and snacks regularly, preferably at the same times every day. Avoid going long periods of time without eating.  Eat foods that are high in fiber, such as fresh fruits, vegetables, beans, and whole grains. Talk with your dietitian about how many servings of carbs you can eat at each meal.  Eat 4-6 oz (112-168 g) of lean protein each day, such as lean meat, chicken, fish, eggs, or tofu. One ounce (oz) of lean protein is equal to: ? 1 oz (28 g) of meat, chicken, or fish. ? 1 egg. ?  cup (62 g) of tofu.  Eat some foods each day that contain healthy fats, such as avocado, nuts, seeds, and fish.   What foods should I eat? Fruits Berries. Apples. Oranges. Peaches. Apricots. Plums. Grapes. Mango. Papaya. Pomegranate. Kiwi. Cherries. Vegetables Lettuce. Spinach. Leafy greens, including kale, chard, collard greens, and mustard greens. Beets. Cauliflower. Cabbage. Broccoli. Carrots. Green beans. Tomatoes. Peppers. Onions. Cucumbers. Brussels sprouts. Grains Whole grains, such as whole-wheat or whole-grain bread, crackers, tortillas, cereal, and pasta. Unsweetened oatmeal. Quinoa. Brown or wild rice. Meats and other proteins Seafood. Poultry without skin. Lean cuts of poultry and beef. Tofu. Nuts. Seeds. Dairy Low-fat or fat-free dairy products such as milk, yogurt, and cheese. The items listed above may not be a complete list of foods and beverages you can eat. Contact a dietitian for more information. What foods should I avoid? Fruits Fruits canned with  syrup. Vegetables Canned vegetables. Frozen vegetables with butter or cream sauce. Grains Refined white flour and flour products such as bread, pasta, snack foods, and cereals. Avoid all processed foods. Meats and other proteins Fatty cuts of meat. Poultry with skin. Breaded or fried meats. Processed meat. Avoid saturated fats. Dairy Full-fat yogurt, cheese, or milk. Beverages Sweetened drinks, such as soda or iced tea. The items listed above may not be a complete list of foods and beverages you should avoid. Contact a dietitian for more information. Questions to ask a health care provider  Do I need to meet with a diabetes educator?  Do I need to meet with a dietitian?  What number can I call if I have questions?  When are the best times to check my blood glucose? Where to find more information:  American Diabetes Association: diabetes.org  Academy of Nutrition and Dietetics: www.eatright.org  National Institute of Diabetes and Digestive and Kidney Diseases: www.niddk.nih.gov  Association of Diabetes Care and Education Specialists: www.diabeteseducator.org Summary  It is important to have healthy eating   habits because your blood sugar (glucose) levels are greatly affected by what you eat and drink.  A healthy meal plan will help you control your blood glucose and maintain a healthy lifestyle.  Your health care provider may recommend that you work with a dietitian to make a meal plan that is best for you.  Keep in mind that carbohydrates (carbs) and alcohol have immediate effects on your blood glucose levels. It is important to count carbs and to use alcohol carefully. This information is not intended to replace advice given to you by your health care provider. Make sure you discuss any questions you have with your health care provider. Document Revised: 03/20/2019 Document Reviewed: 03/20/2019 Elsevier Patient Education  2021 [employer]  

## 2020-06-02 NOTE — Progress Notes (Unsigned)
Pt not sure which medication is causing her to have the shakes

## 2020-06-13 ENCOUNTER — Other Ambulatory Visit (HOSPITAL_COMMUNITY)
Admission: RE | Admit: 2020-06-13 | Discharge: 2020-06-13 | Disposition: A | Discharge status: Home / Self Care | Payer: Medicaid Other | Source: Ambulatory Visit | Attending: Primary Care | Admitting: Primary Care

## 2020-06-13 ENCOUNTER — Encounter (INDEPENDENT_AMBULATORY_CARE_PROVIDER_SITE_OTHER): Payer: Self-pay | Admitting: Primary Care

## 2020-06-13 ENCOUNTER — Other Ambulatory Visit: Payer: Self-pay

## 2020-06-13 ENCOUNTER — Ambulatory Visit (INDEPENDENT_AMBULATORY_CARE_PROVIDER_SITE_OTHER): Payer: Medicaid Other | Admitting: Primary Care

## 2020-06-13 VITALS — BP 123/84 | HR 94 | Temp 97.7°F | Ht 62.0 in | Wt 175.8 lb

## 2020-06-13 DIAGNOSIS — N898 Other specified noninflammatory disorders of vagina: Secondary | ICD-10-CM | POA: Insufficient documentation

## 2020-06-13 DIAGNOSIS — Z124 Encounter for screening for malignant neoplasm of cervix: Secondary | ICD-10-CM | POA: Diagnosis not present

## 2020-06-13 DIAGNOSIS — Z113 Encounter for screening for infections with a predominantly sexual mode of transmission: Secondary | ICD-10-CM | POA: Insufficient documentation

## 2020-06-13 DIAGNOSIS — B373 Candidiasis of vulva and vagina: Secondary | ICD-10-CM | POA: Insufficient documentation

## 2020-06-13 DIAGNOSIS — Z760 Encounter for issue of repeat prescription: Secondary | ICD-10-CM

## 2020-06-13 MED ORDER — "INSULIN SYRINGE 30G X 1/2"" 1 ML MISC": 1.0000 | Freq: Three times a day (TID) | 6 refills | Status: DC

## 2020-06-13 NOTE — Patient Instructions (Signed)

## 2020-06-13 NOTE — Progress Notes (Signed)
Established Patient Office Visit  Subjective:  Patient ID: Cheryl Parker, female    DOB: 06-16-1980  Age: 40 y.o. MRN: [id number]  CC:  Chief Complaint  Patient presents with  . Gynecologic Exam    HPI Cheryl Parker is a 40 year old female who presents for a gynecological exam. She voices concern of vaginal discharge. Past Medical History:  Diagnosis Date  . DKA (diabetic ketoacidoses) 09/10/2014; 06/02/2017  . GERD (gastroesophageal reflux disease)   . Gestational diabetes   . Headache   . Hypertension   . Nausea & vomiting 07/2017  . Pregnancy induced hypertension   . Type 1 diabetes mellitus (HCC)    Insulin dependent    Past Surgical History:  Procedure Laterality Date  . CESAREAN SECTION  2011  . CESAREAN SECTION N/A 07/05/2012   Procedure: CESAREAN SECTION;  Surgeon: Mora Bellman, MD;  Location: [facility];  Service: Obstetrics;  Laterality: N/A;  . CESAREAN SECTION N/A 06/25/2015   Procedure: CESAREAN SECTION;  Surgeon: Mora Bellman, MD;  Location: [facility];  Service: Obstetrics;  Laterality: N/A;  . ESOPHAGOGASTRODUODENOSCOPY (EGD) WITH PROPOFOL Left 12/28/2016   Procedure: ESOPHAGOGASTRODUODENOSCOPY (EGD) WITH PROPOFOL;  Surgeon: Ronnette Juniper, MD;  Location: [facility];  Service: Gastroenterology;  Laterality: Left;    Family History  Problem Relation Age of Onset  . Diabetes Mother   . Kidney disease Father   . Birth defects Son        unilateral renal agenesis    Social History   Socioeconomic History  . Marital status: Single    Spouse name: Not on file  . Number of children: Not on file  . Years of education: Not on file  . Highest education level: Not on file  Occupational History  . Not on file  Tobacco Use  . Smoking status: Former Smoker    Packs/day: 0.10    Years: 17.00    Pack years: 1.70    Types: Cigarettes  . Smokeless tobacco: Never Used  . Tobacco comment: 06/02/2017 "quit ~ 6 months ago"  Vaping Use  . Vaping Use: Never used   Substance and Sexual Activity  . Alcohol use: No  . Drug use: No  . Sexual activity: Not on file  Other Topics Concern  . Not on file  Social History Narrative  . Not on file   Social Determinants of Health   Financial Resource Strain: Not on file  Food Insecurity: Not on file  Transportation Needs: Not on file  Physical Activity: Not on file  Stress: Not on file  Social Connections: Not on file  Intimate Partner Violence: Not on file    Outpatient Medications Prior to Visit  Medication Sig Dispense Refill  . atorvastatin (LIPITOR) 20 MG tablet Take 1 tablet (20 mg total) by mouth daily. 90 tablet 3  . blood glucose meter kit and supplies KIT Dispense based on patient and insurance preference. Use up to four times daily as directed. (FOR ICD-9 250.00, 250.01). 1 each 0  . glimepiride (AMARYL) 4 MG tablet Take 1 tablet (4 mg total) by mouth daily before breakfast. 90 tablet 1  . glucose blood (TRUE METRIX BLOOD GLUCOSE TEST) test strip Use as instructed 100 each 12  . insulin NPH-regular Human (NOVOLIN 70/30) (70-30) 100 UNIT/ML injection Inject 30 Units into the skin 2 (two) times daily with a meal. Follow up with PCP for adjustments to insulin regimen 10 mL 6  . lisinopril (ZESTRIL) 10 MG tablet Take 1 tablet (10  mg total) by mouth daily. 90 tablet 3  . sitaGLIPtin (JANUVIA) 100 MG tablet Take 1 tablet (100 mg total) by mouth daily. 90 tablet 1   No facility-administered medications prior to visit.    No Known Allergies  ROS Review of Systems  All other systems reviewed and are negative.     Objective:    Physical Exam  BP 123/84 (BP Location: Right Arm, Patient Position: Sitting, Cuff Size: Normal)   Pulse 94   Temp 97.7 F (36.5 C) (Temporal)   Ht '5\' 2"'  (1.575 m)   Wt 175 lb 12.8 oz (79.7 kg)   LMP 06/03/2020 (Approximate)   SpO2 96%   BMI 32.15 kg/m  Wt Readings from Last 3 Encounters:  06/13/20 175 lb 12.8 oz (79.7 kg)  06/02/20 177 lb (80.3 kg)   02/07/20 173 lb 12.8 oz (78.8 kg)   CONSTITUTIONAL: Well-developed, well-nourished female in no acute distress.  HENT:  Normocephalic, atraumatic, External right and left ear normal. Oropharynx is clear and moist EYES: Conjunctivae and EOM are normal. Pupils are equal, round, and reactive to light. No scleral icterus.  NECK: Normal range of motion, supple, no masses.  Normal thyroid.  SKIN: Skin is warm and dry. No rash noted. Not diaphoretic. No erythema. No pallor. Cheryl Parker: Alert and oriented to person, place, and time. Normal reflexes, muscle tone coordination. No cranial nerve deficit noted. PSYCHIATRIC: Normal mood and affect. Normal behavior. Normal judgment and thought content. CARDIOVASCULAR: Normal heart rate noted, regular rhythm RESPIRATORY: Clear to auscultation bilaterally. Effort and breath sounds normal, no problems with respiration noted. BREASTS: Taught SBE return demonstration  ABDOMEN: Soft, normal bowel sounds, no distention noted.  No tenderness, rebound or guarding.  PELVIC: Normal appearing external genitalia; normal appearing vaginal mucosa and cervix.  No abnormal discharge noted.  Pap smear obtained.  Normal uterine size, no other palpable masses, no uterine or adnexal tenderness. MUSCULOSKELETAL: Normal range of motion. No tenderness.  No cyanosis, clubbing, or edema.  2+ distal pulses.   Health Maintenance Due  Topic Date Due  . COVID-19 Vaccine (1) Never done  . OPHTHALMOLOGY EXAM  Never done  . PAP SMEAR-Modifier  12/22/2017    There are no preventive care reminders to display for this patient.  Lab Results  Component Value Date   TSH 0.509 11/16/2014   Lab Results  Component Value Date   WBC 8.4 11/11/2019   HGB 11.6 (L) 11/11/2019   HCT 35.5 (L) 11/11/2019   MCV 95.9 11/11/2019   PLT 379 11/11/2019   Lab Results  Component Value Date   NA 136 11/11/2019   K 3.8 11/11/2019   CO2 27 11/11/2019   GLUCOSE 237 (H) 11/11/2019   BUN 9  11/11/2019   CREATININE 0.57 11/11/2019   BILITOT 0.5 11/11/2019   ALKPHOS 138 (H) 11/11/2019   AST 43 (H) 11/11/2019   ALT 57 (H) 11/11/2019   PROT 5.9 (L) 11/11/2019   ALBUMIN 2.9 (L) 11/11/2019   CALCIUM 8.7 (L) 11/11/2019   ANIONGAP 8 11/11/2019   Lab Results  Component Value Date   CHOL 213 (H) 12/17/2019   Lab Results  Component Value Date   HDL 50 12/17/2019   Lab Results  Component Value Date   LDLCALC 149 (H) 12/17/2019   Lab Results  Component Value Date   TRIG 79 12/17/2019   Lab Results  Component Value Date   CHOLHDL 4.3 12/17/2019   Lab Results  Component Value Date   HGBA1C 11.1 (A)  06/02/2020      Assessment & Plan:  Lavonia was seen today for gynecologic exam.  Diagnoses and all orders for this visit:  Vaginal discharge -     Cervicovaginal ancillary only  Cervical cancer screening -     Cytology - PAP([place])  Other orders -     Insulin Syringe-Needle U-100 (INSULIN SYRINGE 1CC/30GX1/2") 30G X 1/2" 1 ML MISC; 1 Bag by Does not apply route 4 (four) times daily -  before meals and at bedtime.   No orders of the defined types were placed in this encounter.   Follow-up: No follow-ups on file.    Kerin Perna, NP

## 2020-06-16 LAB — CERVICOVAGINAL ANCILLARY ONLY
Bacterial Vaginitis (gardnerella): NEGATIVE
Candida Glabrata: NEGATIVE
Candida Vaginitis: POSITIVE — AB
Chlamydia: NEGATIVE
Comment: NEGATIVE
Comment: NEGATIVE
Comment: NEGATIVE
Comment: NEGATIVE
Comment: NEGATIVE
Comment: NORMAL
Neisseria Gonorrhea: NEGATIVE
Trichomonas: NEGATIVE

## 2020-06-17 ENCOUNTER — Other Ambulatory Visit (INDEPENDENT_AMBULATORY_CARE_PROVIDER_SITE_OTHER): Payer: Self-pay | Admitting: Primary Care

## 2020-06-17 DIAGNOSIS — B379 Candidiasis, unspecified: Secondary | ICD-10-CM

## 2020-06-17 LAB — CYTOLOGY - PAP
Adequacy: ABSENT
Diagnosis: NEGATIVE

## 2020-06-17 MED ORDER — FLUCONAZOLE 150 MG PO TABS: 150.0000 mg | ORAL_TABLET | Freq: Once | ORAL | 0 refills | Status: AC

## 2020-07-22 ENCOUNTER — Other Ambulatory Visit (INDEPENDENT_AMBULATORY_CARE_PROVIDER_SITE_OTHER): Payer: Self-pay | Admitting: Primary Care

## 2020-07-22 DIAGNOSIS — E1165 Type 2 diabetes mellitus with hyperglycemia: Secondary | ICD-10-CM

## 2020-07-22 DIAGNOSIS — Z760 Encounter for issue of repeat prescription: Secondary | ICD-10-CM

## 2020-07-22 DIAGNOSIS — H5213 Myopia, bilateral: Secondary | ICD-10-CM | POA: Diagnosis not present

## 2020-07-22 DIAGNOSIS — Z794 Long term (current) use of insulin: Secondary | ICD-10-CM

## 2020-07-22 LAB — HM DIABETES EYE EXAM

## 2020-07-22 NOTE — Telephone Encounter (Signed)
   Notes to clinic: Per pharmacy script is not due until 07/27/2020  Pharmacy states that patient is taking more than prescribed Attempted to contact patient 2 times no answer. Vm was left  Advise for a new script with more units   Requested Prescriptions  Pending Prescriptions Disp Refills   insulin NPH-regular Human (NOVOLIN 70/30) (70-30) 100 UNIT/ML injection 10 mL 6    Sig: Inject 30 Units into the skin 2 (two) times daily with a meal. Follow up with PCP for adjustments to insulin regimen      Endocrinology:  Diabetes - Insulins Failed - 07/22/2020  1:50 PM      Failed - HBA1C is between 0 and 7.9 and within 180 days    Hemoglobin A1C  Date Value Ref Range Status  06/02/2020 11.1 (A) 4.0 - 5.6 % Final   Hgb A1c MFr Bld  Date Value Ref Range Status  11/08/2019 10.7 (H) 4.8 - 5.6 % Final    Comment:    (NOTE) Pre diabetes:          5.7%-6.4%  Diabetes:              >6.4%  Glycemic control for   <7.0% adults with diabetes           Passed - Valid encounter within last 6 months    Recent Outpatient Visits           1 month ago Vaginal discharge   [facility] Gwinda Passe P, NP   1 month ago Type 2 diabetes mellitus with hyperglycemia, with long-term current use of insulin (HCC)   [facility] Gwinda Passe P, NP   5 months ago Type 2 diabetes mellitus with hyperglycemia, with long-term current use of insulin (HCC)   [facility] Gwinda Passe P, NP   8 months ago Type 2 diabetes mellitus with hyperglycemia, with long-term current use of insulin (HCC)   [facility] Grayce Sessions, NP       Future Appointments             In 1 month Randa Evens, Kinnie Scales, NP [facility]

## 2020-07-22 NOTE — Telephone Encounter (Signed)
Copied from CRM [id number]. Topic: Quick Communication - Rx Refill/Question >> Jul 22, 2020 11:40 AM Louie Bun, Rosey Bath D wrote: Medication: insulin NPH-regular Human (NOVOLIN 70/30) (70-30) 100 UNIT/ML injection. Patient state that she is running out on med and needs a refill and to speak with a nurse/cma about this.  Has the patient contacted their pharmacy? Yes.   (Agent: If no, request that the patient contact the pharmacy for the refill.) (Agent: If yes, when and what did the pharmacy advise?)  Preferred Pharmacy (with phone number or street name): [id number] - [place], [place] - [address]  Agent: Please be advised that RX refills may take up to 3 business days. We ask that you follow-up with your pharmacy.

## 2020-07-27 MED ORDER — NOVOLIN 70/30 (70-30) 100 UNIT/ML ~~LOC~~ SUSP: 30.0000 [IU] | Freq: Two times a day (BID) | SUBCUTANEOUS | 6 refills | Status: DC

## 2020-09-01 ENCOUNTER — Ambulatory Visit (INDEPENDENT_AMBULATORY_CARE_PROVIDER_SITE_OTHER): Payer: Medicaid Other | Admitting: Primary Care

## 2020-09-11 ENCOUNTER — Encounter (INDEPENDENT_AMBULATORY_CARE_PROVIDER_SITE_OTHER): Payer: Self-pay | Admitting: Primary Care

## 2020-09-11 ENCOUNTER — Ambulatory Visit (INDEPENDENT_AMBULATORY_CARE_PROVIDER_SITE_OTHER): Payer: Medicaid Other | Admitting: Primary Care

## 2020-09-11 ENCOUNTER — Other Ambulatory Visit: Payer: Self-pay

## 2020-09-11 VITALS — BP 113/75 | HR 89 | Resp 16 | Wt 173.0 lb

## 2020-09-11 DIAGNOSIS — E785 Hyperlipidemia, unspecified: Secondary | ICD-10-CM | POA: Diagnosis not present

## 2020-09-11 DIAGNOSIS — E1165 Type 2 diabetes mellitus with hyperglycemia: Secondary | ICD-10-CM | POA: Diagnosis not present

## 2020-09-11 DIAGNOSIS — Z760 Encounter for issue of repeat prescription: Secondary | ICD-10-CM | POA: Diagnosis not present

## 2020-09-11 DIAGNOSIS — I10 Essential (primary) hypertension: Secondary | ICD-10-CM | POA: Diagnosis not present

## 2020-09-11 DIAGNOSIS — Z794 Long term (current) use of insulin: Secondary | ICD-10-CM | POA: Diagnosis not present

## 2020-09-11 LAB — POCT GLYCOSYLATED HEMOGLOBIN (HGB A1C): Hemoglobin A1C: 10.5 % — AB (ref 4.0–5.6)

## 2020-09-11 LAB — POCT CBG (FASTING - GLUCOSE)-MANUAL ENTRY: Glucose Fasting, POC: 228 mg/dL — AB (ref 70–99)

## 2020-09-11 MED ORDER — NOVOLIN 70/30 (70-30) 100 UNIT/ML ~~LOC~~ SUSP: 30.0000 [IU] | Freq: Two times a day (BID) | SUBCUTANEOUS | 6 refills | Status: DC

## 2020-09-11 MED ORDER — SITAGLIPTIN PHOSPHATE 100 MG PO TABS: 100.0000 mg | ORAL_TABLET | Freq: Every day | ORAL | 1 refills | Status: DC

## 2020-09-11 MED ORDER — ATORVASTATIN CALCIUM 20 MG PO TABS: 20.0000 mg | ORAL_TABLET | Freq: Every day | ORAL | 3 refills | Status: DC

## 2020-09-11 MED ORDER — GLIMEPIRIDE 4 MG PO TABS: 4.0000 mg | ORAL_TABLET | Freq: Every day | ORAL | 1 refills | Status: DC

## 2020-09-11 MED ORDER — LISINOPRIL 10 MG PO TABS: 10.0000 mg | ORAL_TABLET | Freq: Every day | ORAL | 3 refills | Status: DC

## 2020-09-11 NOTE — Progress Notes (Signed)
Subjective:  Patient ID: Cheryl Parker, female    DOB: 1980-09-29  Age: 40 y.o. MRN: [id number]  CC: Follow-up, Diabetes, and Medication Refill   HPI Cheryl Parker presents for follow-up of diabetes. Patient does not check blood sugar at home.   Compliant with meds - Yes Checking CBGs? Yes  Fasting avg - 200-300   Exercising regularly? - Yes Watching carbohydrate intake? - No Neuropathy ? - No Hypoglycemic events - No  - Recovers with :   Pertinent ROS:  Polyuria - No Polydipsia - Yes Vision problems - No  Medications as noted below. Taking them regularly without complication/adverse reaction being reported today.   History Cheryl Parker has a past medical history of DKA (diabetic ketoacidoses) (09/10/2014; 06/02/2017), GERD (gastroesophageal reflux disease), Gestational diabetes, Headache, Hypertension, Nausea & vomiting (07/2017), Pregnancy induced hypertension, and Type 1 diabetes mellitus ([place]).   She has a past surgical history that includes Cesarean section (2011); Cesarean section (N/A, 07/05/2012); Cesarean section (N/A, 06/25/2015); and Esophagogastroduodenoscopy (egd) with propofol (Left, 12/28/2016).   Her family history includes Birth defects in her son; Diabetes in her mother; Kidney disease in her father.She reports that she has quit smoking. Her smoking use included cigarettes. She has a 1.70 pack-year smoking history. She has never used smokeless tobacco. She reports that she does not drink alcohol and does not use drugs.  Current Outpatient Medications on File Prior to Visit  Medication Sig Dispense Refill  . blood glucose meter kit and supplies KIT Dispense based on patient and insurance preference. Use up to four times daily as directed. (FOR ICD-9 250.00, 250.01). 1 each 0  . glucose blood (TRUE METRIX BLOOD GLUCOSE TEST) test strip Use as instructed 100 each 12  . Insulin Syringe-Needle U-100 (INSULIN SYRINGE 1CC/30GX1/2") 30G X 1/2" 1 ML MISC 1 Bag by Does not  apply route 4 (four) times daily -  before meals and at bedtime. 100 each 6   No current facility-administered medications on file prior to visit.    ROS Review of Systems  Endocrine: Positive for polydipsia.  All other systems reviewed and are negative.   Objective:  BP 113/75   Pulse 89   Resp 16   Wt 173 lb (78.5 kg)   LMP 08/18/2020 (Approximate)   SpO2 98%   BMI 31.64 kg/m   BP Readings from Last 3 Encounters:  09/11/20 113/75  06/13/20 123/84  06/02/20 138/85    Wt Readings from Last 3 Encounters:  09/11/20 173 lb (78.5 kg)  06/13/20 175 lb 12.8 oz (79.7 kg)  06/02/20 177 lb (80.3 kg)    Physical Exam Vitals reviewed.  Constitutional:      Appearance: She is obese.  HENT:     Head: Normocephalic.     Right Ear: Tympanic membrane and external ear normal.     Left Ear: Tympanic membrane and external ear normal.  Eyes:     Extraocular Movements: Extraocular movements intact.     Pupils: Pupils are equal, round, and reactive to light.  Cardiovascular:     Rate and Rhythm: Normal rate and regular rhythm.  Pulmonary:     Effort: Pulmonary effort is normal.     Breath sounds: Normal breath sounds.  Abdominal:     General: Bowel sounds are normal. There is distension.     Palpations: Abdomen is soft.  Musculoskeletal:        General: Normal range of motion.     Cervical back: Normal range of motion and neck  supple.  Skin:    General: Skin is warm and dry.  Neurological:     Mental Status: She is alert and oriented to person, place, and time.  Psychiatric:        Mood and Affect: Mood normal.        Behavior: Behavior normal.        Thought Content: Thought content normal.        Judgment: Judgment normal.     Lab Results  Component Value Date   HGBA1C 10.5 (A) 09/11/2020   HGBA1C 11.1 (A) 06/02/2020   HGBA1C 9.6 (A) 02/07/2020    Lab Results  Component Value Date   WBC 7.7 09/11/2020   HGB 14.2 09/11/2020   HCT 43.9 09/11/2020   PLT 364  09/11/2020   GLUCOSE 218 (H) 09/11/2020   CHOL 228 (H) 09/11/2020   TRIG 82 09/11/2020   HDL 45 09/11/2020   LDLCALC 169 (H) 09/11/2020   ALT 55 (H) 09/11/2020   AST 44 (H) 09/11/2020   NA 137 09/11/2020   K 4.5 09/11/2020   CL 100 09/11/2020   CREATININE 0.61 09/11/2020   BUN 9 09/11/2020   CO2 21 09/11/2020   TSH 0.509 11/16/2014   HGBA1C 10.5 (A) 09/11/2020     Assessment & Plan:   Cheryl Parker was seen today for follow-up, diabetes and medication refill.  Diagnoses and all orders for this visit:  Type 2 diabetes mellitus with hyperglycemia, with long-term current use of insulin ([place]) ADA recommends the following therapeutic goals for glycemic control related to A1c measurements: Goal of therapy: Less than 6.5 hemoglobin A1c.  Reference clinical practice recommendations. Foods that are high in carbohydrates are the following rice, potatoes, breads, sugars, and pastas.  Reduction in the intake (eating) will assist in lowering your blood sugars. -     Glucose (CBG), Fasting -     HgB A1c -     insulin NPH-regular Human (NOVOLIN 70/30) (70-30) 100 UNIT/ML injection; Inject 30 Units into the skin 2 (two) times daily with a meal.  -     glimepiride (AMARYL) 4 MG tablet; Take 1 tablet (4 mg total) by mouth daily before breakfast. -     CBC with Differential  Medication refill -     insulin NPH-regular Human (NOVOLIN 70/30) (70-30) 100 UNIT/ML injection; Inject 30 Units into the skin 2 (two) times daily with a meal. Follow up with PCP for adjustments to insulin regimen -     glimepiride (AMARYL) 4 MG tablet; Take 1 tablet (4 mg total) by mouth daily before breakfast.  Hyperlipidemia, unspecified hyperlipidemia type Encouraged heart healthy diet, increase exercise, avoid trans fats, consider a krill oil capsule daily -     Discontinue: atorvastatin (LIPITOR) 20 MG tablet; Take 1 tablet (20 mg total) by mouth daily. -     Lipid Panel  Essential hypertension Blood pressure goal of  less than 130/80, and is well controlled today's reading is 113/75 and continue to monitor low-sodium, DASH diet, medication compliance, 150 minutes of moderate intensity exercise per week. Discussed medication compliance, adverse effects. -     lisinopril (ZESTRIL) 10 MG tablet; Take 1 tablet (10 mg total) by mouth daily. -     CMP14+EGFR  Other orders -     sitaGLIPtin (JANUVIA) 100 MG tablet; Take 1 tablet (100 mg total) by mouth daily.    I have discontinued Cheryl Parker's atorvastatin. I am also having her maintain her blood glucose meter kit and supplies, True  Metrix Blood Glucose Test, INSULIN SYRINGE 1CC/30GX1/2", NovoLIN 70/30, glimepiride, sitaGLIPtin, and lisinopril.  Meds ordered this encounter  Medications  . insulin NPH-regular Human (NOVOLIN 70/30) (70-30) 100 UNIT/ML injection    Sig: Inject 30 Units into the skin 2 (two) times daily with a meal. Follow up with PCP for adjustments to insulin regimen    Dispense:  10 mL    Refill:  6  . glimepiride (AMARYL) 4 MG tablet    Sig: Take 1 tablet (4 mg total) by mouth daily before breakfast.    Dispense:  90 tablet    Refill:  1  . DISCONTD: atorvastatin (LIPITOR) 20 MG tablet    Sig: Take 1 tablet (20 mg total) by mouth daily.    Dispense:  90 tablet    Refill:  3  . sitaGLIPtin (JANUVIA) 100 MG tablet    Sig: Take 1 tablet (100 mg total) by mouth daily.    Dispense:  90 tablet    Refill:  1  . lisinopril (ZESTRIL) 10 MG tablet    Sig: Take 1 tablet (10 mg total) by mouth daily.    Dispense:  90 tablet    Refill:  3     Follow-up:   Return in about 3 months (around 12/12/2020) for DM.  The above assessment and management plan was discussed with the patient. The patient verbalized understanding of and has agreed to the management plan. Patient is aware to call the clinic if symptoms fail to improve or worsen. Patient is aware when to return to the clinic for a follow-up visit. Patient educated on when it is  appropriate to go to the emergency department.   Juluis Mire, NP-C

## 2020-09-11 NOTE — Progress Notes (Signed)
F/u DM  Medication RF

## 2020-09-12 ENCOUNTER — Other Ambulatory Visit (INDEPENDENT_AMBULATORY_CARE_PROVIDER_SITE_OTHER): Payer: Self-pay | Admitting: Primary Care

## 2020-09-12 DIAGNOSIS — E782 Mixed hyperlipidemia: Secondary | ICD-10-CM

## 2020-09-12 LAB — CMP14+EGFR
ALT: 55 IU/L — ABNORMAL HIGH (ref 0–32)
AST: 44 IU/L — ABNORMAL HIGH (ref 0–40)
Albumin/Globulin Ratio: 2 (ref 1.2–2.2)
Albumin: 4.4 g/dL (ref 3.8–4.8)
Alkaline Phosphatase: 126 IU/L — ABNORMAL HIGH (ref 44–121)
BUN/Creatinine Ratio: 15 (ref 9–23)
BUN: 9 mg/dL (ref 6–24)
Bilirubin Total: 0.3 mg/dL (ref 0.0–1.2)
CO2: 21 mmol/L (ref 20–29)
Calcium: 9.3 mg/dL (ref 8.7–10.2)
Chloride: 100 mmol/L (ref 96–106)
Creatinine, Ser: 0.61 mg/dL (ref 0.57–1.00)
Globulin, Total: 2.2 g/dL (ref 1.5–4.5)
Glucose: 218 mg/dL — ABNORMAL HIGH (ref 65–99)
Potassium: 4.5 mmol/L (ref 3.5–5.2)
Sodium: 137 mmol/L (ref 134–144)
Total Protein: 6.6 g/dL (ref 6.0–8.5)
eGFR: 116 mL/min/{1.73_m2} (ref >59)

## 2020-09-12 LAB — CBC WITH DIFFERENTIAL/PLATELET
Basophils Absolute: 0 10*3/uL (ref 0.0–0.2)
Basos: 0 %
EOS (ABSOLUTE): 0 10*3/uL (ref 0.0–0.4)
Eos: 0 %
Hematocrit: 43.9 % (ref 34.0–46.6)
Hemoglobin: 14.2 g/dL (ref 11.1–15.9)
Immature Grans (Abs): 0 10*3/uL (ref 0.0–0.1)
Immature Granulocytes: 0 %
Lymphocytes Absolute: 2.6 10*3/uL (ref 0.7–3.1)
Lymphs: 34 %
MCH: 30.6 pg (ref 26.6–33.0)
MCHC: 32.3 g/dL (ref 31.5–35.7)
MCV: 95 fL (ref 79–97)
Monocytes Absolute: 0.5 10*3/uL (ref 0.1–0.9)
Monocytes: 6 %
Neutrophils Absolute: 4.6 10*3/uL (ref 1.4–7.0)
Neutrophils: 60 %
Platelets: 364 10*3/uL (ref 150–450)
RBC: 4.64 x10E6/uL (ref 3.77–5.28)
RDW: 11.7 % (ref 11.7–15.4)
WBC: 7.7 10*3/uL (ref 3.4–10.8)

## 2020-09-12 LAB — LIPID PANEL
Chol/HDL Ratio: 5.1 ratio — ABNORMAL HIGH (ref 0.0–4.4)
Cholesterol, Total: 228 mg/dL — ABNORMAL HIGH (ref 100–199)
HDL: 45 mg/dL (ref >39)
LDL Chol Calc (NIH): 169 mg/dL — ABNORMAL HIGH (ref 0–99)
Triglycerides: 82 mg/dL (ref 0–149)
VLDL Cholesterol Cal: 14 mg/dL (ref 5–40)

## 2020-09-12 MED ORDER — ATORVASTATIN CALCIUM 20 MG PO TABS: 20.0000 mg | ORAL_TABLET | Freq: Every day | ORAL | 3 refills | Status: DC

## 2020-09-15 ENCOUNTER — Telehealth: Payer: Self-pay

## 2020-09-15 NOTE — Telephone Encounter (Signed)
JANUVIA PA APPROVED UNTIL 09/15/21

## 2020-10-03 ENCOUNTER — Other Ambulatory Visit (INDEPENDENT_AMBULATORY_CARE_PROVIDER_SITE_OTHER): Payer: Self-pay | Admitting: Primary Care

## 2020-10-03 DIAGNOSIS — E1165 Type 2 diabetes mellitus with hyperglycemia: Secondary | ICD-10-CM

## 2020-10-03 DIAGNOSIS — Z760 Encounter for issue of repeat prescription: Secondary | ICD-10-CM

## 2020-10-03 MED ORDER — NOVOLIN 70/30 (70-30) 100 UNIT/ML ~~LOC~~ SUSP: 30.0000 [IU] | Freq: Two times a day (BID) | SUBCUTANEOUS | 6 refills | Status: DC

## 2020-10-08 DIAGNOSIS — H524 Presbyopia: Secondary | ICD-10-CM | POA: Diagnosis not present

## 2021-03-27 ENCOUNTER — Other Ambulatory Visit: Payer: Self-pay

## 2021-03-27 ENCOUNTER — Ambulatory Visit (INDEPENDENT_AMBULATORY_CARE_PROVIDER_SITE_OTHER): Payer: Medicaid Other | Admitting: Primary Care

## 2021-03-27 ENCOUNTER — Encounter (INDEPENDENT_AMBULATORY_CARE_PROVIDER_SITE_OTHER): Payer: Self-pay | Admitting: Primary Care

## 2021-03-27 VITALS — BP 118/79 | HR 113 | Temp 96.3°F | Ht 62.0 in | Wt 172.2 lb

## 2021-03-27 DIAGNOSIS — E1165 Type 2 diabetes mellitus with hyperglycemia: Secondary | ICD-10-CM

## 2021-03-27 DIAGNOSIS — E119 Type 2 diabetes mellitus without complications: Secondary | ICD-10-CM | POA: Diagnosis not present

## 2021-03-27 DIAGNOSIS — Z794 Long term (current) use of insulin: Secondary | ICD-10-CM | POA: Diagnosis not present

## 2021-03-27 DIAGNOSIS — Z23 Encounter for immunization: Secondary | ICD-10-CM | POA: Diagnosis not present

## 2021-03-27 DIAGNOSIS — I10 Essential (primary) hypertension: Secondary | ICD-10-CM | POA: Diagnosis not present

## 2021-03-27 LAB — POCT GLYCOSYLATED HEMOGLOBIN (HGB A1C): Hemoglobin A1C: 11.2 % — AB (ref 4.0–5.6)

## 2021-03-27 MED ORDER — TRULICITY 0.75 MG/0.5ML ~~LOC~~ SOAJ: 0.7500 mg | SUBCUTANEOUS | 6 refills | Status: DC

## 2021-03-27 MED ORDER — LANTUS SOLOSTAR 100 UNIT/ML ~~LOC~~ SOPN: 20.0000 [IU] | PEN_INJECTOR | Freq: Two times a day (BID) | SUBCUTANEOUS | 99 refills | Status: DC

## 2021-03-27 MED ORDER — GLIMEPIRIDE 2 MG PO TABS: 2.0000 mg | ORAL_TABLET | Freq: Every day | ORAL | 3 refills | Status: DC

## 2021-03-27 NOTE — Patient Instructions (Signed)
Influenza, Adult °Influenza is also called "the flu." It is an infection in the lungs, nose, and throat (respiratory tract). It spreads easily from person to person (is contagious). The flu causes symptoms that are like a cold, along with high fever and body aches. °What are the causes? °This condition is caused by the influenza virus. You can get the virus by: °Breathing in droplets that are in the air after a person infected with the flu coughed or sneezed. °Touching something that has the virus on it and then touching your mouth, nose, or eyes. °What increases the risk? °Certain things may make you more likely to get the flu. These include: °Not washing your hands often. °Having close contact with many people during cold and flu season. °Touching your mouth, eyes, or nose without first washing your hands. °Not getting a flu shot every year. °You may have a higher risk for the flu, and serious problems, such as a lung infection (pneumonia), if you: °Are older than 65. °Are pregnant. °Have a weakened disease-fighting system (immune system) because of a disease or because you are taking certain medicines. °Have a long-term (chronic) condition, such as: °Heart, kidney, or lung disease. °Diabetes. °Asthma. °Have a liver disorder. °Are very overweight (morbidly obese). °Have anemia. °What are the signs or symptoms? °Symptoms usually begin suddenly and last 4-14 days. They may include: °Fever and chills. °Headaches, body aches, or muscle aches. °Sore throat. °Cough. °Runny or stuffy (congested) nose. °Feeling discomfort in your chest. °Not wanting to eat as much as normal. °Feeling weak or tired. °Feeling dizzy. °Feeling sick to your stomach or throwing up. °How is this treated? °If the flu is found early, you can be treated with antiviral medicine. This can help to reduce how bad the illness is and how long it lasts. This may be given by mouth or through an IV tube. °Taking care of yourself at home can help your  symptoms get better. Your doctor may want you to: °Take over-the-counter medicines. °Drink plenty of fluids. °The flu often goes away on its own. If you have very bad symptoms or other problems, you may be treated in a hospital. °Follow these instructions at home: °  °Activity °Rest as needed. Get plenty of sleep. °Stay home from work or school as told by your doctor. °Do not leave home until you do not have a fever for 24 hours without taking medicine. °Leave home only to go to your doctor. °Eating and drinking °Take an ORS (oral rehydration solution). This is a drink that is sold at pharmacies and stores. °Drink enough fluid to keep your pee pale yellow. °Drink clear fluids in small amounts as you are able. Clear fluids include: °Water. °Ice chips. °Fruit juice mixed with water. °Low-calorie sports drinks. °Eat bland foods that are easy to digest. Eat small amounts as you are able. These foods include: °Bananas. °Applesauce. °Rice. °Lean meats. °Toast. °Crackers. °Do not eat or drink: °Fluids that have a lot of sugar or caffeine. °Alcohol. °Spicy or fatty foods. °General instructions °Take over-the-counter and prescription medicines only as told by your doctor. °Use a cool mist humidifier to add moisture to the air in your home. This can make it easier for you to breathe. °When using a cool mist humidifier, clean it daily. Empty water and replace with clean water. °Cover your mouth and nose when you cough or sneeze. °Wash your hands with soap and water often and for at least 20 seconds. This is also important after   you cough or sneeze. If you cannot use soap and water, use alcohol-based hand sanitizer. °Keep all follow-up visits. °How is this prevented? ° °Get a flu shot every year. You may get the flu shot in late summer, fall, or winter. Ask your doctor when you should get your flu shot. °Avoid contact with people who are sick during fall and winter. This is cold and flu season. °Contact a doctor if: °You get  new symptoms. °You have: °Chest pain. °Watery poop (diarrhea). °A fever. °Your cough gets worse. °You start to have more mucus. °You feel sick to your stomach. °You throw up. °Get help right away if you: °Have shortness of breath. °Have trouble breathing. °Have skin or nails that turn a bluish color. °Have very bad pain or stiffness in your neck. °Get a sudden headache. °Get sudden pain in your face or ear. °Cannot eat or drink without throwing up. °These symptoms may represent a serious problem that is an emergency. Get medical help right away. Call your local emergency services (911 in the [place] °Do not wait to see if the symptoms will go away. °Do not drive yourself to the hospital. °Summary °Influenza is also called "the flu." It is an infection in the lungs, nose, and throat. It spreads easily from person to person. °Take over-the-counter and prescription medicines only as told by your doctor. °Getting a flu shot every year is the best way to not get the flu. °This information is not intended to replace advice given to you by your health care provider. Make sure you discuss any questions you have with your health care provider. °Document Revised: 11/30/2019 Document Reviewed: 11/30/2019 °Elsevier Patient Education © 2022 [employer] ° °

## 2021-03-27 NOTE — Progress Notes (Signed)
Subjective:  Patient ID: [name], female    DOB: 11/01/1980  Age: 40 y.o. MRN: [id number]  CC: Medication Refill and Diabetes   HPI Salle Brandle presents forFollow-up of diabetes. Patient does not check blood sugar at home  Compliant with meds - Yes Checking CBGs?yes  Fasting Rangeandial average - from 300-400  Exercising regularly? - Yes Watching carbohydrate intake? - Yes Neuropathy ? - No Hypoglycemic events - No  - Recovers with :   Pertinent ROS:  Polyuria - No Polydipsia - No Vision problems - No  Medications as noted below. Taking them regularly without complication/adverse reaction being reported today.   History Chessa has a past medical history of DKA (diabetic ketoacidoses) (09/10/2014; 06/02/2017), GERD (gastroesophageal reflux disease), Gestational diabetes, Headache, Hypertension, Nausea & vomiting (07/2017), Pregnancy induced hypertension, and Type 1 diabetes mellitus ([place]).   She has a past surgical history that includes Cesarean section (2011); Cesarean section (N/A, 07/05/2012); Cesarean section (N/A, 06/25/2015); and Esophagogastroduodenoscopy (egd) with propofol (Left, 12/28/2016).   Her family history includes Birth defects in her son; Diabetes in her mother; Kidney disease in her father.She reports that she has quit smoking. Her smoking use included cigarettes. She has a 1.70 pack-year smoking history. She has never used smokeless tobacco. She reports that she does not drink alcohol and does not use drugs.  Current Outpatient Medications on File Prior to Visit  Medication Sig Dispense Refill   atorvastatin (LIPITOR) 20 MG tablet Take 1 tablet (20 mg total) by mouth daily. 90 tablet 3   blood glucose meter kit and supplies KIT Dispense based on patient and insurance preference. Use up to four times daily as directed. (FOR ICD-9 250.00, 250.01). 1 each 0   glimepiride (AMARYL) 4 MG tablet Take 1 tablet (4 mg total) by mouth daily before breakfast. 90  tablet 1   glucose blood (TRUE METRIX BLOOD GLUCOSE TEST) test strip Use as instructed 100 each 12   insulin NPH-regular Human (NOVOLIN 70/30) (70-30) 100 UNIT/ML injection Inject 30 Units into the skin 2 (two) times daily with a meal. Follow up with PCP for adjustments to insulin regimen 10 mL 6   Insulin Syringe-Needle U-100 (INSULIN SYRINGE 1CC/30GX1/2") 30G X 1/2" 1 ML MISC 1 Bag by Does not apply route 4 (four) times daily -  before meals and at bedtime. 100 each 6   lisinopril (ZESTRIL) 10 MG tablet Take 1 tablet (10 mg total) by mouth daily. 90 tablet 3   sitaGLIPtin (JANUVIA) 100 MG tablet Take 1 tablet (100 mg total) by mouth daily. 90 tablet 1   No current facility-administered medications on file prior to visit.    ROS Comprehensive review of systems noted in HPI  Objective:  BP 118/79 (BP Location: Right Arm, Patient Position: Sitting, Cuff Size: Normal)   Pulse (!) 113   Temp (!) 96.3 F (35.7 C) (Temporal)   Ht '5\' 2"'  (1.575 m)   Wt 172 lb 3.2 oz (78.1 kg)   LMP 03/02/2021 (Approximate)   SpO2 96%   BMI 31.50 kg/m   BP Readings from Last 3 Encounters:  03/27/21 118/79  09/11/20 113/75  06/13/20 123/84    Wt Readings from Last 3 Encounters:  03/27/21 172 lb 3.2 oz (78.1 kg)  09/11/20 173 lb (78.5 kg)  06/13/20 175 lb 12.8 oz (79.7 kg)    Physical Exam Physical exam: General: Vital signs reviewed.  Patient is well-developed and well-nourished, obese female in no acute distress and cooperative with exam. Head: Normocephalic  and atraumatic. Eyes: EOMI, conjunctivae normal, no scleral icterus. Neck: Supple, trachea midline, normal ROM, no JVD, masses, thyromegaly, or carotid bruit present. Cardiovascular: RRR, S1 normal, S2 normal, no murmurs, gallops, or rubs. Pulmonary/Chest: Clear to auscultation bilaterally, no wheezes, rales, or rhonchi. Abdominal: Soft, non-tender, non-distended, BS +, no masses, organomegaly, or guarding present. Musculoskeletal: No joint  deformities, erythema, or stiffness, ROM full and nontender. Extremities: No lower extremity edema bilaterally,  pulses symmetric and intact bilaterally. No cyanosis or clubbing. Neurological: A&O x3, Strength is normal Skin: Warm, dry and intact. No rashes or erythema. Psychiatric: Normal mood and affect. speech and behavior is normal. Cognition and memory are normal.    Lab Results  Component Value Date   HGBA1C 10.5 (A) 09/11/2020   HGBA1C 11.1 (A) 06/02/2020   HGBA1C 9.6 (A) 02/07/2020    Lab Results  Component Value Date   WBC 7.7 09/11/2020   HGB 14.2 09/11/2020   HCT 43.9 09/11/2020   PLT 364 09/11/2020   GLUCOSE 218 (H) 09/11/2020   CHOL 228 (H) 09/11/2020   TRIG 82 09/11/2020   HDL 45 09/11/2020   LDLCALC 169 (H) 09/11/2020   ALT 55 (H) 09/11/2020   AST 44 (H) 09/11/2020   NA 137 09/11/2020   K 4.5 09/11/2020   CL 100 09/11/2020   CREATININE 0.61 09/11/2020   BUN 9 09/11/2020   CO2 21 09/11/2020   TSH 0.509 11/16/2014   HGBA1C 10.5 (A) 09/11/2020     Assessment & Plan:   Shaelynn was seen today for medication refill and diabetes.  Diagnoses and all orders for this visit:  Type 2 diabetes mellitus with hyperglycemia, with long-term current use of insulin (HCC) -     HgB A1c 12.2 previously in May 10.5.  Medication has been adjusted to include Lantus 20 units in the morning and bedtime, Trulicity once daily and Amaryl reduced to 2 mg daily.  She will follow-up with clinical pharmacist in 4 weeks to reevaluate medication and any side effects.  Demonstrated how to use Trulicity return demonstration was perfect.  Comprehensive diabetic foot examination, type 2 DM, encounter for [facility])       Referring to podiatry  Essential hypertension Blood pressure well controlled on monotherapy ACE for renal protection and hypertension.  Patient is also monitoring her sodium intake  I am having Jennine Menzer maintain her blood glucose meter kit and supplies, True Metrix  Blood Glucose Test, INSULIN SYRINGE 1CC/30GX1/2", glimepiride, sitaGLIPtin, lisinopril, atorvastatin, and NovoLIN 70/30.  No orders of the defined types were placed in this encounter.    Follow-up:   No follow-ups on file.  The above assessment and management plan was discussed with the patient. The patient verbalized understanding of and has agreed to the management plan. Patient is aware to call the clinic if symptoms fail to improve or worsen. Patient is aware when to return to the clinic for a follow-up visit. Patient educated on when it is appropriate to go to the emergency department.   Juluis Mire, NP-C

## 2021-03-30 ENCOUNTER — Telehealth (INDEPENDENT_AMBULATORY_CARE_PROVIDER_SITE_OTHER): Payer: Self-pay

## 2021-03-30 ENCOUNTER — Other Ambulatory Visit (INDEPENDENT_AMBULATORY_CARE_PROVIDER_SITE_OTHER): Payer: Self-pay | Admitting: Primary Care

## 2021-03-30 DIAGNOSIS — E1165 Type 2 diabetes mellitus with hyperglycemia: Secondary | ICD-10-CM

## 2021-03-30 MED ORDER — "INSULIN SYRINGE 30G X 1/2"" 1 ML MISC"
1.0000 | Freq: Three times a day (TID) | 6 refills | Status: DC
  Filled 2021-03-30: qty 100, fill #0

## 2021-03-30 MED ORDER — TRULICITY 0.75 MG/0.5ML ~~LOC~~ SOAJ
0.7500 mg | SUBCUTANEOUS | 6 refills | Status: DC
  Filled 2021-03-30: qty 2, 28d supply, fill #0

## 2021-03-30 NOTE — Telephone Encounter (Signed)
Copied from CRM [id number]. Topic: Quick Communication - Rx Refill/Question >> Mar 30, 2021 11:18 AM Pawlus, Maxine Glenn A wrote: Reason for CRM: Pt stated she also need Needles sent into the pharmacy- please advise. Pt also stated she had trouble picking up her TRULICITY from [facility] in [place], [place].   Please send pen needles to patient pharmacy. Maryjean Morn, CMA

## 2021-03-31 ENCOUNTER — Other Ambulatory Visit: Payer: Self-pay

## 2021-03-31 NOTE — Telephone Encounter (Signed)
Prior authorization needed. Please call pharmacy.

## 2021-04-01 ENCOUNTER — Other Ambulatory Visit (INDEPENDENT_AMBULATORY_CARE_PROVIDER_SITE_OTHER): Payer: Self-pay | Admitting: Primary Care

## 2021-04-01 ENCOUNTER — Other Ambulatory Visit: Payer: Self-pay

## 2021-04-01 DIAGNOSIS — Z794 Long term (current) use of insulin: Secondary | ICD-10-CM

## 2021-04-01 DIAGNOSIS — I10 Essential (primary) hypertension: Secondary | ICD-10-CM

## 2021-04-01 DIAGNOSIS — E1165 Type 2 diabetes mellitus with hyperglycemia: Secondary | ICD-10-CM

## 2021-04-01 MED ORDER — PEN NEEDLES 30G X 8 MM MISC: 1.0000 "pen " | Freq: Two times a day (BID) | 11 refills | Status: DC

## 2021-04-01 MED ORDER — TRULICITY 0.75 MG/0.5ML ~~LOC~~ SOAJ: 0.7500 mg | SUBCUTANEOUS | 6 refills | Status: DC

## 2021-04-01 MED ORDER — LISINOPRIL 10 MG PO TABS: 10.0000 mg | ORAL_TABLET | Freq: Every day | ORAL | 3 refills | Status: DC

## 2021-04-01 NOTE — Telephone Encounter (Signed)
PEC transferred call to office upon patient call back. Sent to PCP to document.

## 2021-04-01 NOTE — Telephone Encounter (Signed)
Left message for patient unclear why problems with medication please call when available # left

## 2021-04-01 NOTE — Telephone Encounter (Signed)
Called patient sent medication to correct pharmacy if any other problems patient to call

## 2021-04-28 NOTE — Progress Notes (Signed)
S:    PCP: Marcelino Duster   Patient presents for diabetes evaluation, education, and management. Patient was referred and last seen by Primary Care Provider on 03/27/21 at which time sitagliptin was switched to Trulicity and insulin NPH was switched to Lantus and glimepiride was decreased.   Today, patient arrives in good spirits. Reports urinating less frequently and not being as thirsty since last visit's medication changes but reports BG mostly 300s-400s. She reports not eating a very healthy diet and not exercising consistently. After starting Trulicity she initially had some nausea but this has improved with each use. Today will be her third dose of Trulicity. Reports constipation with glimepiride.   Family/Social History: Her family history includes Birth defects in her son; Diabetes in her mother; Kidney disease in her father.She reports that she has quit smoking. Her smoking use included cigarettes. She has a 1.70 pack-year smoking history. She has never used smokeless tobacco. She reports that she does not drink alcohol and does not use drugs.  Insurance coverage/medication affordability: Medicaid  Medication adherence reported.   Current diabetes medications include: Lantus 20 units BID, glimepiride 2 mg daily (before breakfast), Trulicity 0.75 mg weekly (Wednesdays)  Current hypertension medications include: lisinopril 10 mg daily Current hyperlipidemia medications include: atorvastatin 20 mg daily  Patient denies hypoglycemic events.  Patient reported dietary habits: Eats 2 meals/day Breakfast: [place] bacon, eggs, piece of bread, may have cereal Lunch: skips lately, usually eats leftovers otherwise Dinner: burger, hot dog, green beans, salad, usually eats at home Snacks: crackers, chips, apple, orange Drinks: water, sometimes coffee, stopped drinking sodas 1.5 years ago  Patient-reported exercise habits: on her feet all day (has 3 kids), walks for a few minutes once a day     Patient reports nocturia (nighttime urination). Used to be 4-5x/night, now 2x/night.  Patient reports neuropathy (nerve pain) in her hands.  Patient reports visual changes. Has seen eye doctor.  Patient reports self foot exams.   O:  POCT BG: 336 (fasting)  Lab Results  Component Value Date   HGBA1C 11.2 (A) 03/27/2021   There were no vitals filed for this visit.  Lipid Panel     Component Value Date/Time   CHOL 228 (H) 09/11/2020 1030   TRIG 82 09/11/2020 1030   HDL 45 09/11/2020 1030   CHOLHDL 5.1 (H) 09/11/2020 1030   CHOLHDL 9.2 12/27/2016 0343   VLDL 19 12/27/2016 0343   LDLCALC 169 (H) 09/11/2020 1030   Checks BID Home fasting blood sugars: 270  2 hour post-meal/random blood sugars: 300s-400s  Clinical Atherosclerotic Cardiovascular Disease (ASCVD): No  The 10-year ASCVD risk score (Arnett DK, et al., 2019) is: 4.4%   Values used to calculate the score:     Age: 48 years     Sex: Female     Is Non-Hispanic African American: Yes     Diabetic: Yes     Tobacco smoker: No     Systolic Blood Pressure: 118 mmHg     Is BP treated: Yes     HDL Cholesterol: 45 mg/dL     Total Cholesterol: 228 mg/dL    A/P: Diabetes longstanding currently uncontrolled with most recent A1c 11.2 (03/27/21). Patient is able to verbalize appropriate hypoglycemia management plan. Medication adherence appears appropriate. Control is suboptimal due to requiring further medication titration. -Increase Trulicity to 1.5 mg weekly.  -Increase and condense Lantus to 50 units once daily. Instructed her that after a couple weeks of the increased Trulicity dose, if her BG  are still upper 200s or higher, to increase Lantus by 2 units daily up to a max dose of 60 units.  -Discontinue glimepiride.  -Extensively discussed pathophysiology of diabetes, recommended lifestyle interventions, dietary effects on blood sugar control -Counseled on s/sx of and management of hypoglycemia -Next A1C anticipated March  2023.   Total time in face to face counseling 30 minutes. Follow up Pharmacist visit in 1 month. Follow up PCP Clinic Visit in 06/25/21.   Pervis Hocking, PharmD PGY2 Ambulatory Care Pharmacy Resident 04/29/2021 10:24 AM

## 2021-04-29 ENCOUNTER — Ambulatory Visit: Payer: MEDICAID | Attending: Primary Care | Admitting: Pharmacist

## 2021-04-29 ENCOUNTER — Other Ambulatory Visit: Payer: Self-pay

## 2021-04-29 ENCOUNTER — Encounter: Payer: Self-pay | Admitting: Pharmacist

## 2021-04-29 DIAGNOSIS — Z794 Long term (current) use of insulin: Secondary | ICD-10-CM

## 2021-04-29 DIAGNOSIS — E1165 Type 2 diabetes mellitus with hyperglycemia: Secondary | ICD-10-CM

## 2021-04-29 LAB — GLUCOSE, POCT (MANUAL RESULT ENTRY): POC Glucose: 336 mg/dl — AB (ref 70–99)

## 2021-04-29 MED ORDER — ACCU-CHEK SOFTCLIX LANCETS MISC: 12 refills | Status: DC

## 2021-04-29 MED ORDER — ACCU-CHEK GUIDE W/DEVICE KIT: PACK | 0 refills | Status: DC

## 2021-04-29 MED ORDER — TRULICITY 1.5 MG/0.5ML ~~LOC~~ SOAJ: 1.5000 mg | SUBCUTANEOUS | 2 refills | Status: DC

## 2021-04-29 MED ORDER — LANTUS SOLOSTAR 100 UNIT/ML ~~LOC~~ SOPN: 50.0000 [IU] | PEN_INJECTOR | Freq: Every day | SUBCUTANEOUS | 99 refills | Status: DC

## 2021-04-29 MED ORDER — ACCU-CHEK GUIDE VI STRP: ORAL_STRIP | 12 refills | Status: DC

## 2021-06-01 ENCOUNTER — Ambulatory Visit: Payer: Medicaid Other | Admitting: Pharmacist

## 2021-06-01 ENCOUNTER — Encounter: Payer: Self-pay | Admitting: Pharmacist

## 2021-06-01 ENCOUNTER — Ambulatory Visit: Payer: MEDICAID | Attending: Primary Care | Admitting: Pharmacist

## 2021-06-01 DIAGNOSIS — E1165 Type 2 diabetes mellitus with hyperglycemia: Secondary | ICD-10-CM | POA: Diagnosis not present

## 2021-06-01 DIAGNOSIS — Z794 Long term (current) use of insulin: Secondary | ICD-10-CM

## 2021-06-01 MED ORDER — METFORMIN HCL ER 500 MG PO TB24: 500.0000 mg | ORAL_TABLET | Freq: Every day | ORAL | 1 refills | Status: DC

## 2021-06-01 MED ORDER — LANTUS SOLOSTAR 100 UNIT/ML ~~LOC~~ SOPN: 60.0000 [IU] | PEN_INJECTOR | Freq: Every day | SUBCUTANEOUS | 99 refills | Status: DC

## 2021-06-01 NOTE — Progress Notes (Signed)
S:    PCP: Marcelino Duster   Patient presents for diabetes evaluation, education, and management. Patient was referred and last seen by Primary Care Provider on 03/27/21. Pharmacy saw her 04/29/2021 and condensed her Lantus to a once daily injection. We increased her dose to 50 units once daily with instructions to titrate to 60 units if fasting CBGs >130. Additionally, Trulicity was increased to 1.5 mg weekly. We discontinued glimepiride.   Today, patient arrives in good spirits. Reports urinating less frequently and not being as thirsty since last visit's medication changes. She has adjusted her Lantus to 60 units daily. Endorses decreased appetite with Trulicity increase. Denies NV, abdominal pain. Denies any changes in her vision.   Family/Social History:  FHX: Her family history includes Birth defects in her son; Diabetes in her mother; Kidney disease in her father. Tobacco: former smoker Alcohol: none  Insurance coverage/medication affordability: Medicaid  Medication adherence reported. Lantus 60 units daily, Trulicity 1.5 mg weekly  Current hypertension medications include: lisinopril 10 mg daily Current hyperlipidemia medications include: atorvastatin 20 mg daily  Patient denies hypoglycemic events.  Patient reported dietary habits: Eats 2 meals/day Breakfast: [place] bacon, eggs, piece of bread, may have cereal Lunch: skips lately, usually eats leftovers otherwise Dinner: burger, hot dog, green beans, salad, usually eats at home Snacks: crackers, chips, apple, orange Drinks: water, sometimes coffee, stopped drinking sodas 1.5 years ago  Patient-reported exercise habits: on her feet all day (has 3 kids), walks for a few minutes once a day    Patient reports improvement nocturia (nighttime urination). Patient reports neuropathy (nerve pain) in her hands.  Patient denies visual changes since last visit.  Patient reports self foot exams.   O:   Lab Results  Component Value Date    HGBA1C 11.2 (A) 03/27/2021   There were no vitals filed for this visit.  Lipid Panel     Component Value Date/Time   CHOL 228 (H) 09/11/2020 1030   TRIG 82 09/11/2020 1030   HDL 45 09/11/2020 1030   CHOLHDL 5.1 (H) 09/11/2020 1030   CHOLHDL 9.2 12/27/2016 0343   VLDL 19 12/27/2016 0343   LDLCALC 169 (H) 09/11/2020 1030   Checks BID Home fasting blood sugars: 270  2 hour post-meal/random blood sugars: 300s-400s  Clinical Atherosclerotic Cardiovascular Disease (ASCVD): No  The 10-year ASCVD risk score (Arnett DK, et al., 2019) is: 4.4%   Values used to calculate the score:     Age: 41 years     Sex: Female     Is Non-Hispanic African American: Yes     Diabetic: Yes     Tobacco smoker: No     Systolic Blood Pressure: 118 mmHg     Is BP treated: Yes     HDL Cholesterol: 45 mg/dL     Total Cholesterol: 228 mg/dL    A/P: Diabetes longstanding currently uncontrolled with most recent A1c 11.2 (03/27/21). Patient is able to verbalize appropriate hypoglycemia management plan. Medication adherence appears appropriate. Control is suboptimal due to requiring further medication titration. -Start metformin 500 mg XR once daily after supper. Goal is to gradually increase to 2g daily or max tolerated dose.  -Continue Trulicity to 1.5 mg weekly.  -Continue Lantus 60 units once daily. Instructed her that after a week of the metformin, if her BG are still upper 200s or higher, to increase Lantus by 2 units daily up to a max dose of 66 units.  -Extensively discussed pathophysiology of diabetes, recommended lifestyle interventions, dietary effects  on blood sugar control -Counseled on s/sx of and management of hypoglycemia -Next A1C anticipated March 2023.   Total time in face to face counseling 30 minutes. Follow up Pharmacist visit in 1 month.   Butch Penny, PharmD, Patsy [place], CPP Clinical Pharmacist [facility] [phone]

## 2021-06-25 ENCOUNTER — Ambulatory Visit (INDEPENDENT_AMBULATORY_CARE_PROVIDER_SITE_OTHER): Payer: Medicaid Other | Admitting: Primary Care

## 2021-06-25 ENCOUNTER — Encounter (INDEPENDENT_AMBULATORY_CARE_PROVIDER_SITE_OTHER): Payer: Self-pay | Admitting: Primary Care

## 2021-06-25 ENCOUNTER — Other Ambulatory Visit: Payer: Self-pay

## 2021-06-25 VITALS — BP 137/89 | HR 105 | Temp 98.1°F | Ht 62.0 in | Wt 173.4 lb

## 2021-06-25 DIAGNOSIS — E1165 Type 2 diabetes mellitus with hyperglycemia: Secondary | ICD-10-CM | POA: Diagnosis not present

## 2021-06-25 DIAGNOSIS — I10 Essential (primary) hypertension: Secondary | ICD-10-CM

## 2021-06-25 DIAGNOSIS — E785 Hyperlipidemia, unspecified: Secondary | ICD-10-CM

## 2021-06-25 DIAGNOSIS — Z794 Long term (current) use of insulin: Secondary | ICD-10-CM | POA: Diagnosis not present

## 2021-06-25 DIAGNOSIS — E782 Mixed hyperlipidemia: Secondary | ICD-10-CM

## 2021-06-25 LAB — POCT GLYCOSYLATED HEMOGLOBIN (HGB A1C): Hemoglobin A1C: 9 % — AB (ref 4.0–5.6)

## 2021-06-25 MED ORDER — ATORVASTATIN CALCIUM 20 MG PO TABS: 20.0000 mg | ORAL_TABLET | Freq: Every day | ORAL | 3 refills | Status: DC

## 2021-06-25 NOTE — Progress Notes (Signed)
?Cheryl Parker ? ?Cheryl Parker, is a 41 y.o. female ? ?ERD:[id number] ? ?[id number]:[id number] ? ?DOB - 01-May-1980 ? ?Chief Complaint  ?Patient presents with  ? Diabetes  ?    ? ?Subjective:  ? ?Cheryl Parker is a 41 y.o. female here today for a follow up visit for the management of HTN- Denies shortness of breath, headaches, chest pain or lower extremity edema and T2D diabetes.  She denies polyuria, polydipsia , polyphagia or vision changes.   Patient has No headache, No chest pain, No abdominal pain - No Nausea, No new weakness tingling or numbness, No Cough - SOB. Loss of appetite  ? ?No problems updated. ? ?No Known Allergies ? ?Past Medical History:  ?Diagnosis Date  ? DKA (diabetic ketoacidoses) 09/10/2014; 06/02/2017  ? GERD (gastroesophageal reflux disease)   ? Gestational diabetes   ? Headache   ? Hypertension   ? Nausea & vomiting 07/2017  ? Pregnancy induced hypertension   ? Type 1 diabetes mellitus ([place])   ? Insulin dependent  ? ? ?Current Outpatient Medications on File Prior to Visit  ?Medication Sig Dispense Refill  ? Blood Glucose Monitoring Suppl (ACCU-CHEK GUIDE) w/Device KIT Check blood sugar three times daily. Dx E11.65 1 kit 0  ? Dulaglutide (TRULICITY) 1.5 CH/8.8FO SOPN Inject 1.5 mg into the skin once a week. 2 mL 2  ? glucose blood (ACCU-CHEK GUIDE) test strip Check blood sugar three times daily. Dx E11.65 100 each 12  ? insulin glargine (LANTUS SOLOSTAR) 100 UNIT/ML Solostar Pen Inject 60 Units into the skin daily. May increase by 2 units every 3 days up to a max of 66 units daily if fasting CBGs are >130. 15 mL PRN  ? Insulin Pen Needle (PEN NEEDLES) 30G X 8 MM MISC 1 pen by Does not apply route 2 (two) times daily. 100 each 11  ? Insulin Syringe-Needle U-100 (INSULIN SYRINGE 1CC/30GX1/2") 30G X 1/2" 1 ML MISC 1 Bag by Does not apply route 4 (four) times daily -  before meals and at bedtime. 100 each 6  ? lisinopril (ZESTRIL) 10 MG tablet Take 1 tablet (10 mg total) by mouth daily. 90  tablet 3  ? metFORMIN (GLUCOPHAGE XR) 500 MG 24 hr tablet Take 1 tablet (500 mg total) by mouth daily after supper. 90 tablet 1  ? Accu-Chek Softclix Lancets lancets Check blood sugar three times daily. Dx E11.65 100 each 12  ? ?No current facility-administered medications on file prior to visit.  ? ? ?Objective:  ? ?Vitals:  ? 06/25/21 1101 06/25/21 1112  ?BP: (!) 143/94 137/89  ?Pulse: (!) 107 (!) 105  ?Temp: 98.1 ?F (36.7 ?C)   ?TempSrc: Oral   ?SpO2: 96%   ?Weight: 173 lb 6.4 oz (78.7 kg)   ?Height: _0  (1.575 m)   ? ? ?Exam ?General appearance : Awake, alert, not in any distress. Speech Clear. Not toxic looking ?HEENT: Atraumatic and Normocephalic, pupils equally reactive to light and accomodation ?Neck: Supple, no JVD. No cervical lymphadenopathy.  ?Chest: Good air entry bilaterally, no added sounds  ?CVS: S1 S2 regular, no murmurs.  ?Abdomen: Bowel sounds present, Non tender and not distended with no gaurding, rigidity or rebound. ?Extremities: B/L Lower Ext shows no edema, both legs are warm to touch ?Neurology: Awake alert, and oriented X 3, CN II-XII intact, Non focal ?Skin: No Rash ? ?Data Review ?Lab Results  ?Component Value Date  ? HGBA1C 9.0 (A) 06/25/2021  ? HGBA1C 11.2 (A) 03/27/2021  ? HGBA1C  10.5 (A) 09/11/2020  ? ? ?Assessment & Plan  ?[name] was seen today for diabetes. ?Diagnoses and all orders for this visit: ? ?1. Type 2 diabetes mellitus with hyperglycemia, with long-term current use of insulin ([place]) ?- HgB A1c 9.0 improvement  ?Continue monitoringfoods that are high in carbohydrates are the following rice, potatoes, breads, sugars, and pastas.  Reduction in the intake (eating) will assist in lowering your blood sugars.  ? ? ?Essential hypertension ?BP goal - < 130/80 ?Explained that having normal blood pressure is the goal and medications are helping to get to goal and maintain normal blood pressure. ?DIET: Limit salt intake, read nutrition labels to check salt content, limit fried and  high fatty foods  ?Avoid using multisymptom OTC cold preparations that generally contain sudafed which can rise BP. Consult with pharmacist on best cold relief products to use for persons with HTN ?EXERCISE ?Discussed incorporating exercise such as walking - 30 minutes most days of the week and can do in 10 minute intervals    ?-     CMP14+EGFR ? ?Hyperlipidemia, unspecified hyperlipidemia type ? Healthy lifestyle diet of fruits vegetables fish nuts whole grains and low saturated fat . Foods high in cholesterol or liver, fatty meats,cheese, butter avocados, nuts and seeds, chocolate and fried foods. ?-     Lipid Panel ? ?  ? ?Patient have been counseled extensively about nutrition and exercise. Other issues discussed during this visit include: low cholesterol diet, weight control and daily exercise, foot care, annual eye examinations at Ophthalmology, importance of adherence with medications and regular follow-up. We also discussed long term complications of uncontrolled diabetes and hypertension.  ? ?Return in about 3 months (around 09/25/2021) for DM/HTN. ? ?The patient was given clear instructions to go to ER or return to medical center if symptoms don't improve, worsen or new problems develop. The patient verbalized understanding. The patient was told to call to get lab results if they haven't heard anything in the next week.  ? ?This note has been created with Surveyor, quantity. Any transcriptional errors are unintentional.  ? ?Kerin Perna, NP ?06/28/2021, 10:13 PM ? ?

## 2021-06-26 LAB — CMP14+EGFR
ALT: 89 IU/L — ABNORMAL HIGH (ref 0–32)
AST: 32 IU/L (ref 0–40)
Albumin/Globulin Ratio: 2 (ref 1.2–2.2)
Albumin: 4.5 g/dL (ref 3.8–4.8)
Alkaline Phosphatase: 185 IU/L — ABNORMAL HIGH (ref 44–121)
BUN/Creatinine Ratio: 14 (ref 9–23)
BUN: 8 mg/dL (ref 6–24)
Bilirubin Total: 0.2 mg/dL (ref 0.0–1.2)
CO2: 20 mmol/L (ref 20–29)
Calcium: 9.8 mg/dL (ref 8.7–10.2)
Chloride: 101 mmol/L (ref 96–106)
Creatinine, Ser: 0.57 mg/dL (ref 0.57–1.00)
Globulin, Total: 2.3 g/dL (ref 1.5–4.5)
Glucose: 171 mg/dL — ABNORMAL HIGH (ref 70–99)
Potassium: 4.3 mmol/L (ref 3.5–5.2)
Sodium: 138 mmol/L (ref 134–144)
Total Protein: 6.8 g/dL (ref 6.0–8.5)
eGFR: 117 mL/min/{1.73_m2} (ref >59)

## 2021-06-26 LAB — CBC WITH DIFFERENTIAL/PLATELET
Basophils Absolute: 0 10*3/uL (ref 0.0–0.2)
Basos: 0 %
EOS (ABSOLUTE): 0.1 10*3/uL (ref 0.0–0.4)
Eos: 1 %
Hematocrit: 43.1 % (ref 34.0–46.6)
Hemoglobin: 14.1 g/dL (ref 11.1–15.9)
Immature Grans (Abs): 0 10*3/uL (ref 0.0–0.1)
Immature Granulocytes: 0 %
Lymphocytes Absolute: 2.5 10*3/uL (ref 0.7–3.1)
Lymphs: 26 %
MCH: 31.2 pg (ref 26.6–33.0)
MCHC: 32.7 g/dL (ref 31.5–35.7)
MCV: 95 fL (ref 79–97)
Monocytes Absolute: 0.5 10*3/uL (ref 0.1–0.9)
Monocytes: 5 %
Neutrophils Absolute: 6.6 10*3/uL (ref 1.4–7.0)
Neutrophils: 68 %
Platelets: 387 10*3/uL (ref 150–450)
RBC: 4.52 x10E6/uL (ref 3.77–5.28)
RDW: 11.7 % (ref 11.7–15.4)
WBC: 9.8 10*3/uL (ref 3.4–10.8)

## 2021-06-26 LAB — LIPID PANEL
Chol/HDL Ratio: 4.6 ratio — ABNORMAL HIGH (ref 0.0–4.4)
Cholesterol, Total: 224 mg/dL — ABNORMAL HIGH (ref 100–199)
HDL: 49 mg/dL (ref >39)
LDL Chol Calc (NIH): 163 mg/dL — ABNORMAL HIGH (ref 0–99)
Triglycerides: 69 mg/dL (ref 0–149)
VLDL Cholesterol Cal: 12 mg/dL (ref 5–40)

## 2021-07-02 ENCOUNTER — Telehealth (INDEPENDENT_AMBULATORY_CARE_PROVIDER_SITE_OTHER): Payer: Self-pay

## 2021-07-02 NOTE — Telephone Encounter (Signed)
Copied from CRM [id number]. Topic: General - Other ?>> Jul 02, 2021 10:40 AM Marylen Ponto wrote: ?Reason for CRM: Pt stated she is being told by her pharmacy that they are waiting on reply from the provider regarding refill request for insulin glargine (LANTUS SOLOSTAR) 100 UNIT/ML Solostar Pen. Pt requests call back. Cb# [phone] ?

## 2021-07-03 ENCOUNTER — Telehealth (INDEPENDENT_AMBULATORY_CARE_PROVIDER_SITE_OTHER): Payer: Self-pay

## 2021-07-03 ENCOUNTER — Other Ambulatory Visit (INDEPENDENT_AMBULATORY_CARE_PROVIDER_SITE_OTHER): Payer: Self-pay | Admitting: Primary Care

## 2021-07-03 DIAGNOSIS — E1165 Type 2 diabetes mellitus with hyperglycemia: Secondary | ICD-10-CM

## 2021-07-03 DIAGNOSIS — Z794 Long term (current) use of insulin: Secondary | ICD-10-CM

## 2021-07-03 MED ORDER — LANTUS SOLOSTAR 100 UNIT/ML ~~LOC~~ SOPN: 60.0000 [IU] | PEN_INJECTOR | Freq: Every day | SUBCUTANEOUS | 99 refills | Status: DC

## 2021-07-03 MED ORDER — PEN NEEDLES 30G X 8 MM MISC: 1.0000 "pen " | Freq: Two times a day (BID) | 11 refills | Status: DC

## 2021-07-03 NOTE — Telephone Encounter (Signed)
Copied from CRM [id number]. Topic: Appointment Scheduling - Scheduling Inquiry for Clinic ?>> Jul 03, 2021 12:56 PM Laural Benes, [place] C wrote: ?Reason for CRM: pt called in to reschedule her apt with [facility]. Pt would like a call back for further assistance. ?

## 2021-07-06 ENCOUNTER — Ambulatory Visit: Payer: Medicaid Other | Admitting: Pharmacist

## 2021-07-07 ENCOUNTER — Other Ambulatory Visit: Payer: Self-pay

## 2021-07-21 ENCOUNTER — Other Ambulatory Visit: Payer: Self-pay | Admitting: Family Medicine

## 2021-09-21 ENCOUNTER — Ambulatory Visit (HOSPITAL_COMMUNITY)
Admission: EM | Admit: 2021-09-21 | Discharge: 2021-09-21 | Disposition: A | Discharge status: Home / Self Care | Payer: Medicaid Other | Attending: Family Medicine | Admitting: Family Medicine

## 2021-09-21 ENCOUNTER — Ambulatory Visit (INDEPENDENT_AMBULATORY_CARE_PROVIDER_SITE_OTHER): Payer: Medicaid Other

## 2021-09-21 ENCOUNTER — Encounter (HOSPITAL_COMMUNITY): Payer: Self-pay

## 2021-09-21 DIAGNOSIS — R059 Cough, unspecified: Secondary | ICD-10-CM

## 2021-09-21 DIAGNOSIS — J069 Acute upper respiratory infection, unspecified: Secondary | ICD-10-CM | POA: Diagnosis not present

## 2021-09-21 DIAGNOSIS — J019 Acute sinusitis, unspecified: Secondary | ICD-10-CM

## 2021-09-21 MED ORDER — BENZONATATE 100 MG PO CAPS: 100.0000 mg | ORAL_CAPSULE | Freq: Three times a day (TID) | ORAL | 0 refills | Status: DC | PRN

## 2021-09-21 MED ORDER — AMOXICILLIN 875 MG PO TABS: 875.0000 mg | ORAL_TABLET | Freq: Two times a day (BID) | ORAL | 0 refills | Status: AC

## 2021-09-21 NOTE — ED Triage Notes (Signed)
C/o dry cough after being sick x 1-2 weeks. Cough is worse at night.

## 2021-09-21 NOTE — ED Provider Notes (Signed)
[place]    CSN: [id number] Arrival date & time: 09/21/21  0810      History   Chief Complaint Chief Complaint  Patient presents with   Cough    HPI Cheryl Parker is a 41 y.o. female.    Cough Here for cough and congestion that is been going on for 7 days.  At first she had a lot of nasal congestion and rhinorrhea with postnasal drainage and cough.  She has also developed some hoarseness.  The sore throat and the nasal congestion have improved.  No fever at any point.  She is here for continued cough and hoarseness.  Past medical history significant for diabetes with poor control, last A1c 9.0.  She also has hypertension.  She denies a history of asthma  Last menstrual cycle was last week.  Past Medical History:  Diagnosis Date   DKA (diabetic ketoacidoses) 09/10/2014; 06/02/2017   GERD (gastroesophageal reflux disease)    Gestational diabetes    Headache    Hypertension    Nausea & vomiting 07/2017   Pregnancy induced hypertension    Type 1 diabetes mellitus (HCC)    Insulin dependent    Patient Active Problem List   Diagnosis Date Noted   Abnormal liver function 06/01/2017   Diabetic ketoacidosis ([place]) 12/25/2016   Transaminitis 12/24/2016   Essential hypertension 08/04/2015   Status post repeat low transverse cesarean section 06/25/2015   Intractable nausea and vomiting 02/10/2015   Marijuana use 01/08/2015   Dehydration with hyponatremia 11/16/2014   HLD (hyperlipidemia)    Type 2 diabetes mellitus with hyperglycemia ([place])    DKA (diabetic ketoacidoses) 09/10/2014    Past Surgical History:  Procedure Laterality Date   CESAREAN SECTION  2011   CESAREAN SECTION N/A 07/05/2012   Procedure: CESAREAN SECTION;  Surgeon: Mora Bellman, MD;  Location: [facility];  Service: Obstetrics;  Laterality: N/A;   CESAREAN SECTION N/A 06/25/2015   Procedure: CESAREAN SECTION;  Surgeon: Mora Bellman, MD;  Location: [facility];  Service: Obstetrics;  Laterality:  N/A;   ESOPHAGOGASTRODUODENOSCOPY (EGD) WITH PROPOFOL Left 12/28/2016   Procedure: ESOPHAGOGASTRODUODENOSCOPY (EGD) WITH PROPOFOL;  Surgeon: Ronnette Juniper, MD;  Location: [facility];  Service: Gastroenterology;  Laterality: Left;    OB History     Gravida  3   Para  3   Term  3   Preterm  0   AB  0   Living  3      SAB  0   IAB  0   Ectopic  0   Multiple  0   Live Births  3            Home Medications    Prior to Admission medications   Medication Sig Start Date End Date Taking? Authorizing Provider  amoxicillin (AMOXIL) 875 MG tablet Take 1 tablet (875 mg total) by mouth 2 (two) times daily for 7 days. 09/21/21 09/28/21 Yes Madox Corkins, Gwenlyn Perking, MD  benzonatate (TESSALON) 100 MG capsule Take 1 capsule (100 mg total) by mouth 3 (three) times daily as needed for cough. 09/21/21  Yes Barrett Henle, MD  Accu-Chek Softclix Lancets lancets Check blood sugar three times daily. Dx E11.65 04/29/21   Charlott Rakes, MD  atorvastatin (LIPITOR) 20 MG tablet Take 1 tablet (20 mg total) by mouth daily. 06/25/21   Kerin Perna, NP  Blood Glucose Monitoring Suppl (ACCU-CHEK GUIDE) w/Device KIT Check blood sugar three times daily. Dx E11.65 04/29/21   Charlott Rakes, MD  glucose blood (ACCU-CHEK GUIDE) test strip Check blood sugar three times daily. Dx E11.65 04/29/21   Charlott Rakes, MD  insulin glargine (LANTUS SOLOSTAR) 100 UNIT/ML Solostar Pen Inject 60 Units into the skin daily. May increase by 2 units every 3 days up to a max of 66 units daily if fasting CBGs are >130. 07/03/21   Kerin Perna, NP  Insulin Pen Needle (PEN NEEDLES) 30G X 8 MM MISC 1 pen by Does not apply route 2 (two) times daily. 07/03/21   Kerin Perna, NP  Insulin Syringe-Needle U-100 (INSULIN SYRINGE 1CC/30GX1/2") 30G X 1/2" 1 ML MISC 1 Bag by Does not apply route 4 (four) times daily -  before meals and at bedtime. 03/30/21   Kerin Perna, NP  lisinopril (ZESTRIL) 10 MG tablet Take 1  tablet (10 mg total) by mouth daily. 04/01/21   Kerin Perna, NP  metFORMIN (GLUCOPHAGE XR) 500 MG 24 hr tablet Take 1 tablet (500 mg total) by mouth daily after supper. 06/01/21   Charlott Rakes, MD  TRULICITY 1.5 TM/1.9QQ SOPN ADMINISTER 1.5 MG UNDER THE SKIN 1 TIME A WEEK 07/22/21   Charlott Rakes, MD    Family History Family History  Problem Relation Age of Onset   Diabetes Mother    Kidney disease Father    Birth defects Son        unilateral renal agenesis    Social History Social History   Tobacco Use   Smoking status: Former    Packs/day: 0.10    Years: 17.00    Pack years: 1.70    Types: Cigarettes   Smokeless tobacco: Never   Tobacco comments:    06/02/2017 "quit ~ 6 months ago"  Vaping Use   Vaping Use: Never used  Substance Use Topics   Alcohol use: No   Drug use: No     Allergies   Patient has no known allergies.   Review of Systems Review of Systems  Respiratory:  Positive for cough.     Physical Exam Triage Vital Signs ED Triage Vitals [09/21/21 0836]  Enc Vitals Group     BP 121/71     Pulse Rate 100     Resp 16     Temp 97.7 F (36.5 C)     Temp Source Oral     SpO2 100 %     Weight      Height      Head Circumference      Peak Flow      Pain Score      Pain Loc      Pain Edu?      Excl. in [place]?    No data found.  Updated Vital Signs BP 121/71 (BP Location: Left Arm)   Pulse 100   Temp 97.7 F (36.5 C) (Oral)   Resp 16   SpO2 100%   Visual Acuity Right Eye Distance:   Left Eye Distance:   Bilateral Distance:    Right Eye Near:   Left Eye Near:    Bilateral Near:     Physical Exam Vitals reviewed.  Constitutional:      General: She is not in acute distress.    Appearance: She is not toxic-appearing.  HENT:     Right Ear: Tympanic membrane and ear canal normal.     Left Ear: Tympanic membrane and ear canal normal.     Nose: Nose normal.     Mouth/Throat:     Mouth: Mucous  membranes are moist.     Comments:  Clear mucus is draining.  No erythema Eyes:     Extraocular Movements: Extraocular movements intact.     Conjunctiva/sclera: Conjunctivae normal.     Pupils: Pupils are equal, round, and reactive to light.  Cardiovascular:     Rate and Rhythm: Normal rate and regular rhythm.     Heart sounds: No murmur heard. Pulmonary:     Effort: Pulmonary effort is normal. No respiratory distress.     Breath sounds: No stridor. No wheezing, rhonchi or rales.  Musculoskeletal:     Cervical back: Neck supple.  Lymphadenopathy:     Cervical: No cervical adenopathy.  Skin:    Capillary Refill: Capillary refill takes less than 2 seconds.     Coloration: Skin is not jaundiced or pale.  Neurological:     General: No focal deficit present.     Mental Status: She is alert and oriented to person, place, and time.  Psychiatric:        Behavior: Behavior normal.     UC Treatments / Results  Labs (all labs ordered are listed, but only abnormal results are displayed) Labs Reviewed - No data to display  EKG   Radiology DG Chest 2 View  Result Date: 09/21/2021 CLINICAL DATA:  Cough EXAM: CHEST - 2 VIEW COMPARISON:  08/17/2017 FINDINGS: The cardiomediastinal silhouette is within normal limits. There is no focal airspace consolidation. There is no pleural effusion. No pneumothorax. There is no acute osseous abnormality. IMPRESSION: No evidence of acute cardiopulmonary disease. Electronically Signed   By: Maurine Simmering M.D.   On: 09/21/2021 09:12    Procedures Procedures (including critical care time)  Medications Ordered in UC Medications - No data to display  Initial Impression / Assessment and Plan / UC Course  I have reviewed the triage vital signs and the nursing notes.  Pertinent labs & imaging results that were available during my care of the patient were reviewed by me and considered in my medical decision making (see chart for details).     Her x-ray is negative for infiltrate or fluid.  I  am going to send in relief for the cough, and antibiotic for possible sinus infection.  She wanted [place] to give her her first dose of antibiotics here in the clinic, because she is heading back to the ICU to see her mother.  I discussed with her that the antibiotics are not can make her immediately feel better in any way. Final Clinical Impressions(s) / UC Diagnoses   Final diagnoses:  Viral URI with cough  Acute sinusitis, recurrence not specified, unspecified location     Discharge Instructions      Your chest x-ray was clear  Take amoxicillin 875 mg--1 tab twice daily for 7 days  Take benzonatate 100 mg, 1 tab every 8 hours as needed for cough.       ED Prescriptions     Medication Sig Dispense Auth. Provider   amoxicillin (AMOXIL) 875 MG tablet Take 1 tablet (875 mg total) by mouth 2 (two) times daily for 7 days. 14 tablet Amreen Raczkowski, Gwenlyn Perking, MD   benzonatate (TESSALON) 100 MG capsule Take 1 capsule (100 mg total) by mouth 3 (three) times daily as needed for cough. 21 capsule Barrett Henle, MD      PDMP not reviewed this encounter.   Barrett Henle, MD 09/21/21 [phone]

## 2021-09-21 NOTE — Discharge Instructions (Addendum)
Your chest x-ray was clear  Take amoxicillin 875 mg--1 tab twice daily for 7 days  Take benzonatate 100 mg, 1 tab every 8 hours as needed for cough.

## 2021-09-25 ENCOUNTER — Ambulatory Visit (INDEPENDENT_AMBULATORY_CARE_PROVIDER_SITE_OTHER): Payer: Medicaid Other | Admitting: Primary Care

## 2021-10-02 ENCOUNTER — Other Ambulatory Visit: Payer: Self-pay | Admitting: Pharmacist

## 2021-10-02 DIAGNOSIS — I10 Essential (primary) hypertension: Secondary | ICD-10-CM

## 2021-10-02 MED ORDER — LISINOPRIL 10 MG PO TABS: 10.0000 mg | ORAL_TABLET | Freq: Every day | ORAL | 0 refills | Status: DC

## 2021-10-05 ENCOUNTER — Ambulatory Visit (INDEPENDENT_AMBULATORY_CARE_PROVIDER_SITE_OTHER): Payer: Medicaid Other | Admitting: Primary Care

## 2021-10-17 ENCOUNTER — Other Ambulatory Visit: Payer: Self-pay | Admitting: Family Medicine

## 2021-10-19 ENCOUNTER — Encounter (INDEPENDENT_AMBULATORY_CARE_PROVIDER_SITE_OTHER): Payer: Self-pay | Admitting: Primary Care

## 2021-10-19 ENCOUNTER — Ambulatory Visit (INDEPENDENT_AMBULATORY_CARE_PROVIDER_SITE_OTHER): Payer: Medicaid Other | Admitting: Primary Care

## 2021-10-19 VITALS — BP 114/82 | HR 95 | Temp 97.9°F | Ht 62.0 in | Wt 171.0 lb

## 2021-10-19 DIAGNOSIS — E1165 Type 2 diabetes mellitus with hyperglycemia: Secondary | ICD-10-CM | POA: Diagnosis not present

## 2021-10-19 DIAGNOSIS — Z794 Long term (current) use of insulin: Secondary | ICD-10-CM | POA: Diagnosis not present

## 2021-10-19 LAB — POCT GLYCOSYLATED HEMOGLOBIN (HGB A1C): Hemoglobin A1C: 10.1 % — AB (ref 4.0–5.6)

## 2021-10-19 MED ORDER — TRULICITY 1.5 MG/0.5ML ~~LOC~~ SOAJ
SUBCUTANEOUS | 2 refills | Status: DC
Start: 2021-10-19 — End: 2021-12-04

## 2021-10-19 NOTE — Telephone Encounter (Signed)
Requested medication (s) are due for refill today:   Yes  Requested medication (s) are on the active medication list:   Yes with Gwinda Passe, NP today at 11:10  Future visit scheduled:   Yes for today at 11:10 with Gwinda Passe, NP at [facility].   Last ordered: 07/22/2021 2 ml, 2 refills by Dr. Alvis Lemmings with [facility]  Returned to [facility] since pt has an appt there today with Marcelino Duster at 11:10 for follow up   Requested Prescriptions  Pending Prescriptions Disp Refills   TRULICITY 1.5 MG/0.5ML SOPN [Pharmacy Med Name: TRULICITY 1.5MG /0.5ML SDP 0.5ML] 2 mL 2    Sig: ADMINISTER 1.5 MG UNDER THE SKIN 1 TIME A WEEK     Endocrinology:  Diabetes - GLP-1 Receptor Agonists Failed - 10/17/2021  3:40 AM      Failed - HBA1C is between 0 and 7.9 and within 180 days    Hemoglobin A1C  Date Value Ref Range Status  06/25/2021 9.0 (A) 4.0 - 5.6 % Final   Hgb A1c MFr Bld  Date Value Ref Range Status  11/08/2019 10.7 (H) 4.8 - 5.6 % Final    Comment:    (NOTE) Pre diabetes:          5.7%-6.4%  Diabetes:              >6.4%  Glycemic control for   <7.0% adults with diabetes          Passed - Valid encounter within last 6 months    Recent Outpatient Visits           3 months ago Type 2 diabetes mellitus with hyperglycemia, with long-term current use of insulin (HCC)   [facility] Grayce Sessions, NP   4 months ago Type 2 diabetes mellitus with hyperglycemia, with long-term current use of insulin (HCC)   [facility] [place], Jeannett Senior L, RPH-CPP   5 months ago Type 2 diabetes mellitus with hyperglycemia, with long-term current use of insulin [facility])   [facility] [place], Jeannett Senior L, RPH-CPP   6 months ago Type 2 diabetes mellitus with hyperglycemia, with long-term current use of insulin (HCC)   [facility] Grayce Sessions, NP   1 year ago  Type 2 diabetes mellitus with hyperglycemia, with long-term current use of insulin (HCC)   [facility] Grayce Sessions, NP       Future Appointments             Today Grayce Sessions, NP [facility]

## 2021-10-19 NOTE — Telephone Encounter (Signed)
Routed to PCP 

## 2021-10-25 NOTE — Progress Notes (Signed)
Subjective:  Patient ID: Cheryl Parker, female    DOB: 1981/03/28  Age: 41 y.o. MRN: [id number]  CC: Diabetes   HPI Cheryl Parker presents forFollow-up of diabetes. Patient does not check blood sugar at home  Compliant with meds - Yes Checking CBGs? No  Fasting avg -   Postprandial average -  Exercising regularly? - No Watching carbohydrate intake? - Yes Neuropathy ? - No Hypoglycemic events - No  - Recovers with :   Pertinent ROS:  Polyuria - No Polydipsia - Yes Vision problems - No  Medications as noted below. Taking them regularly without complication/adverse reaction being reported today.   History Cheryl Parker has a past medical history of DKA (diabetic ketoacidoses) (09/10/2014; 06/02/2017), GERD (gastroesophageal reflux disease), Gestational diabetes, Headache, Hypertension, Nausea & vomiting (07/2017), Pregnancy induced hypertension, and Type 1 diabetes mellitus (Cheryl Parker).   She has a past surgical history that includes Cesarean section (2011); Cesarean section (N/A, 07/05/2012); Cesarean section (N/A, 06/25/2015); and Esophagogastroduodenoscopy (egd) with propofol (Left, 12/28/2016).   Her family history includes Birth defects in her son; Diabetes in her mother; Kidney disease in her father.She reports that she has quit smoking. Her smoking use included cigarettes. She has a 1.70 pack-year smoking history. She has never used smokeless tobacco. She reports that she does not drink alcohol and does not use drugs.  Current Outpatient Medications on File Prior to Visit  Medication Sig Dispense Refill   Accu-Chek Softclix Lancets lancets Check blood sugar three times daily. Dx E11.65 100 each 12   atorvastatin (LIPITOR) 20 MG tablet Take 1 tablet (20 mg total) by mouth daily. 90 tablet 3   Blood Glucose Monitoring Suppl (ACCU-CHEK GUIDE) w/Device KIT Check blood sugar three times daily. Dx E11.65 1 kit 0   glucose blood (ACCU-CHEK GUIDE) test strip Check blood sugar three times daily.  Dx E11.65 100 each 12   insulin glargine (LANTUS SOLOSTAR) 100 UNIT/ML Solostar Pen Inject 60 Units into the skin daily. May increase by 2 units every 3 days up to a max of 66 units daily if fasting CBGs are >130. 15 mL PRN   Insulin Pen Needle (PEN NEEDLES) 30G X 8 MM MISC 1 pen by Does not apply route 2 (two) times daily. 100 each 11   Insulin Syringe-Needle U-100 (INSULIN SYRINGE 1CC/30GX1/2") 30G X 1/2" 1 ML MISC 1 Bag by Does not apply route 4 (four) times daily -  before meals and at bedtime. 100 each 6   lisinopril (ZESTRIL) 10 MG tablet Take 1 tablet (10 mg total) by mouth daily. 90 tablet 0   metFORMIN (GLUCOPHAGE XR) 500 MG 24 hr tablet Take 1 tablet (500 mg total) by mouth daily after supper. 90 tablet 1   No current facility-administered medications on file prior to visit.    ROS Review of Systems  Objective:  BP 114/82   Pulse 95   Temp 97.9 F (36.6 C) (Oral)   Ht _0  (1.575 m)   Wt 171 lb (77.6 kg)   LMP 09/29/2021 (Approximate)   SpO2 97%   BMI 31.28 kg/m   BP Readings from Last 3 Encounters:  10/19/21 114/82  09/21/21 121/71  06/25/21 137/89    Wt Readings from Last 3 Encounters:  10/19/21 171 lb (77.6 kg)  06/25/21 173 lb 6.4 oz (78.7 kg)  03/27/21 172 lb 3.2 oz (78.1 kg)    Physical Exam  Lab Results  Component Value Date   HGBA1C 10.1 (A) 10/19/2021   HGBA1C 9.0 (A)  06/25/2021   HGBA1C 11.2 (A) 03/27/2021    Lab Results  Component Value Date   WBC 9.8 06/25/2021   HGB 14.1 06/25/2021   HCT 43.1 06/25/2021   PLT 387 06/25/2021   GLUCOSE 171 (H) 06/25/2021   CHOL 224 (H) 06/25/2021   TRIG 69 06/25/2021   HDL 49 06/25/2021   LDLCALC 163 (H) 06/25/2021   ALT 89 (H) 06/25/2021   AST 32 06/25/2021   NA 138 06/25/2021   K 4.3 06/25/2021   CL 101 06/25/2021   CREATININE 0.57 06/25/2021   BUN 8 06/25/2021   CO2 20 06/25/2021   TSH 0.509 11/16/2014   HGBA1C 10.1 (A) 10/19/2021     Assessment & Plan:   Cheryl Parker was seen today for  diabetes.  Diagnoses and all orders for this visit:  Type 2 diabetes mellitus with hyperglycemia, with long-term current use of insulin (HCC) -     HgB A1c 10.1 increased from 9.0 3 months ago- wrong direction Discussed  co- morbidities with uncontrol diabetes  Complications -diabetic retinopathy, (close your eyes ? What do you see nothing) nephropathy decrease in kidney function- can lead to dialysis-on a machine 3 days a week to filter your kidney, neuropathy- numbness and tinging in your hands and feet,  increase risk of heart attack and stroke, and amputation due to decrease wound healing and circulation. Decrease your risk by taking medication daily as prescribed, monitor carbohydrates- foods that are high in carbohydrates are the following rice, potatoes, breads, sugars, and pastas.  Reduction in the intake (eating) will assist in lowering your blood sugars. Exercise daily at least 30 minutes daily.   -     CBC with Differential/Platelet; Future -     Comprehensive metabolic panel; Future -     Lipid panel; Future  Other orders -     Dulaglutide (TRULICITY) 1.5 LK/9.1PH SOPN; ADMINISTER 1.5 MG UNDER THE SKIN 1 TIME A WEEK   I have discontinued Cheryl Parker's benzonatate. I have also changed her Trulicity. Additionally, I am having her maintain her INSULIN SYRINGE 1CC/30GX1/2", Accu-Chek Guide, Accu-Chek Guide, Accu-Chek Softclix Lancets, metFORMIN, atorvastatin, Lantus SoloStar, Pen Needles, and lisinopril.  Meds ordered this encounter  Medications   Dulaglutide (TRULICITY) 1.5 XT/0.5WP SOPN    Sig: ADMINISTER 1.5 MG UNDER THE SKIN 1 TIME A WEEK    Dispense:  2 mL    Refill:  2    Order Specific Question:   Supervising Provider    Answer:   Tresa Garter [[id number]]     Follow-up:   Return in about 3 months (around 01/19/2022).  The above assessment and management plan was discussed with the patient. The patient verbalized understanding of and has agreed to the management  plan. Patient is aware to call the clinic if symptoms fail to improve or worsen. Patient is aware when to return to the clinic for a follow-up visit. Patient educated on when it is appropriate to go to the emergency department.   Juluis Mire, NP-C

## 2021-11-26 ENCOUNTER — Ambulatory Visit: Payer: Medicaid Other | Attending: Primary Care | Admitting: Pharmacist

## 2021-11-26 DIAGNOSIS — Z794 Long term (current) use of insulin: Secondary | ICD-10-CM

## 2021-11-26 DIAGNOSIS — E1165 Type 2 diabetes mellitus with hyperglycemia: Secondary | ICD-10-CM

## 2021-11-26 NOTE — Progress Notes (Signed)
S:     No chief complaint on file.  Cheryl Parker is a 41 y.o. female who presents for diabetes evaluation, education, and management. PMH is significant for HTN, T2DM (complicated w/ hx of DKA). Patient was referred and last seen by Primary Care Provider, Gwinda Passe, on 10/19/2021.   Today, patient arrives in good spirits and presents without any assistance.   Patient reports Diabetes is longstanding. Since I saw her last, she stopped metformin d/t side effects (diarrhea). She tells me that her Trulicity causes side effects (nausea). She also has to force herself to eat. She remains compliant with her insulin.   Family/Social History:  -Fhx: DM, kidney disease  -Tobacco: former smoker  -Alcohol: none reported   Current diabetes medications include: Lantus 68 units daily, Trulicity 1.5 mg weekly, metformin 500 mg XR (not taking)  Patient reports adherence to taking all medications as prescribed with the exception metformin.  Insurance coverage: [place] [employer]   Patient denies hypoglycemic events.  Reported home fasting blood sugars: low 200s    Patient denies nocturia (nighttime urination).  Patient denies neuropathy (nerve pain). Patient reports visual changes. Patient reports self foot exams.   Patient reported dietary habits:  -Denies any changes since I saw her last -Previously reported dietary habits are as follows:  Eats 2 meals/day Breakfast: [place] bacon, eggs, piece of bread, may have cereal Lunch: skips lately, usually eats leftovers otherwise Dinner: burger, hot dog, green beans, salad, usually eats at home Snacks: crackers, chips, apple, orange Drinks: water, sometimes coffee, stopped drinking sodas 1.5 years ago  Patient-reported exercise habits: on her feet daily with childcare, walks a few minutes daily    O:   ROS  Physical Exam  7 day average blood glucose: no meter with her today  No CGM with her today   Lab Results  Component Value Date    HGBA1C 10.1 (A) 10/19/2021   There were no vitals filed for this visit.  Lipid Panel     Component Value Date/Time   CHOL 224 (H) 06/25/2021 1133   TRIG 69 06/25/2021 1133   HDL 49 06/25/2021 1133   CHOLHDL 4.6 (H) 06/25/2021 1133   CHOLHDL 9.2 12/27/2016 0343   VLDL 19 12/27/2016 0343   LDLCALC 163 (H) 06/25/2021 1133    Clinical Atherosclerotic Cardiovascular Disease (ASCVD): No  The 10-year ASCVD risk score (Arnett DK, et al., 2019) is: 3%   Values used to calculate the score:     Age: 63 years     Sex: Female     Is Non-Hispanic African American: Yes     Diabetic: Yes     Tobacco smoker: No     Systolic Blood Pressure: 114 mmHg     Is BP treated: Yes     HDL Cholesterol: 49 mg/dL     Total Cholesterol: 224 mg/dL   Patient is participating in a Managed Medicaid Plan:  Yes   A/P: Diabetes longstanding currently uncontrolled. Patient is able to verbalize appropriate hypoglycemia management plan. Medication adherence appears appropriate. She cannot tolerate metformin and has difficulty with Trulicity. Will change to Ozempic to see if she can tolerate this better.  -Discontinued metformin, Trulicity.  -Continue Lantus 68u daily.  -Started Ozempic 0.25 mg weekly.  -Patient educated on purpose, proper use, and potential adverse effects of Ozempic.  -Extensively discussed pathophysiology of diabetes, recommended lifestyle interventions, dietary effects on blood sugar control.  -Counseled on s/sx of and management of hypoglycemia.  -Next A1c anticipated  12/2021.   Written patient instructions provided. Patient verbalized understanding of treatment plan. Total time in face to face counseling 30 minutes.    Follow-up:  Pharmacist in 1 month.  Butch Penny, PharmD, P[place]y [place], CPP Clinical Pharmacist [facility] [phone]

## 2021-12-04 ENCOUNTER — Telehealth (INDEPENDENT_AMBULATORY_CARE_PROVIDER_SITE_OTHER): Payer: Self-pay | Admitting: Primary Care

## 2021-12-04 ENCOUNTER — Other Ambulatory Visit: Payer: Self-pay | Admitting: Pharmacist

## 2021-12-04 MED ORDER — ROSUVASTATIN CALCIUM 20 MG PO TABS: 20.0000 mg | ORAL_TABLET | Freq: Every day | ORAL | 0 refills | Status: DC

## 2021-12-04 MED ORDER — OZEMPIC (0.25 OR 0.5 MG/DOSE) 2 MG/3ML ~~LOC~~ SOPN: 0.2500 mg | PEN_INJECTOR | SUBCUTANEOUS | 1 refills | Status: DC

## 2021-12-04 NOTE — Telephone Encounter (Signed)
Rxs sent

## 2021-12-04 NOTE — Telephone Encounter (Signed)
Pt called to confirm if Cheryl Parker was still sending over medications and changes from last visit / please advise

## 2022-01-01 ENCOUNTER — Ambulatory Visit: Payer: Medicaid Other | Admitting: Pharmacist

## 2022-01-19 ENCOUNTER — Ambulatory Visit (INDEPENDENT_AMBULATORY_CARE_PROVIDER_SITE_OTHER): Payer: Medicaid Other | Admitting: Primary Care

## 2022-01-22 ENCOUNTER — Other Ambulatory Visit (INDEPENDENT_AMBULATORY_CARE_PROVIDER_SITE_OTHER): Payer: Self-pay | Admitting: Primary Care

## 2022-01-22 NOTE — Telephone Encounter (Signed)
No longer on current medication list Requested Prescriptions  Pending Prescriptions Disp Refills  . TRULICITY 1.5 FF/6.3WG SOPN [Pharmacy Med Name: TRULICITY 1.5MG /0.5ML SDP 0.5ML] 2 mL 2    Sig: ADMINISTER 1.5 MG UNDER THE SKIN 1 TIME A WEEK     Endocrinology:  Diabetes - GLP-1 Receptor Agonists Failed - 01/22/2022  5:50 AM      Failed - HBA1C is between 0 and 7.9 and within 180 days    Hemoglobin A1C  Date Value Ref Range Status  10/19/2021 10.1 (A) 4.0 - 5.6 % Final   Hgb A1c MFr Bld  Date Value Ref Range Status  11/08/2019 10.7 (H) 4.8 - 5.6 % Final    Comment:    (NOTE) Pre diabetes:          5.7%-6.4%  Diabetes:              >6.4%  Glycemic control for   <7.0% adults with diabetes          Passed - Valid encounter within last 6 months    Recent Outpatient Visits          1 month ago Type 2 diabetes mellitus with hyperglycemia, with long-term current use of insulin ([place])   [place], Annie Main L, RPH-CPP   3 months ago Type 2 diabetes mellitus with hyperglycemia, with long-term current use of insulin ([place])   [place], Michelle P, NP   7 months ago Type 2 diabetes mellitus with hyperglycemia, with long-term current use of insulin ([place])   [place], Michelle P, NP   7 months ago Type 2 diabetes mellitus with hyperglycemia, with long-term current use of insulin [facility])   [place], Annie Main L, RPH-CPP   8 months ago Type 2 diabetes mellitus with hyperglycemia, with long-term current use of insulin [facility])   [place], Jarome Matin, RPH-CPP      Future Appointments            In 4 weeks Tresa Endo, [place]

## 2022-01-24 ENCOUNTER — Other Ambulatory Visit (INDEPENDENT_AMBULATORY_CARE_PROVIDER_SITE_OTHER): Payer: Self-pay | Admitting: Primary Care

## 2022-01-25 NOTE — Telephone Encounter (Signed)
Requested Prescriptions  Pending Prescriptions Disp Refills  . TRULICITY 1.5 VW/0.9WJ SOPN [Pharmacy Med Name: TRULICITY 1.5MG /0.5ML SDP 0.5ML] 2 mL 2    Sig: ADMINISTER 1.5 MG UNDER THE SKIN 1 TIME A WEEK     Endocrinology:  Diabetes - GLP-1 Receptor Agonists Failed - 01/24/2022  4:29 PM      Failed - HBA1C is between 0 and 7.9 and within 180 days    Hemoglobin A1C  Date Value Ref Range Status  10/19/2021 10.1 (A) 4.0 - 5.6 % Final   Hgb A1c MFr Bld  Date Value Ref Range Status  11/08/2019 10.7 (H) 4.8 - 5.6 % Final    Comment:    (NOTE) Pre diabetes:          5.7%-6.4%  Diabetes:              >6.4%  Glycemic control for   <7.0% adults with diabetes          Passed - Valid encounter within last 6 months    Recent Outpatient Visits          2 months ago Type 2 diabetes mellitus with hyperglycemia, with long-term current use of insulin ([place])   [place], Annie Main L, RPH-CPP   3 months ago Type 2 diabetes mellitus with hyperglycemia, with long-term current use of insulin ([place])   [place], Michelle P, NP   7 months ago Type 2 diabetes mellitus with hyperglycemia, with long-term current use of insulin ([place])   [place], Michelle P, NP   7 months ago Type 2 diabetes mellitus with hyperglycemia, with long-term current use of insulin [facility])   [place], Annie Main L, RPH-CPP   9 months ago Type 2 diabetes mellitus with hyperglycemia, with long-term current use of insulin [facility])   [place], Jarome Matin, RPH-CPP      Future Appointments            In 3 weeks Tresa Endo, [place]

## 2022-02-01 ENCOUNTER — Ambulatory Visit: Payer: Medicaid Other | Admitting: Pharmacist

## 2022-02-18 ENCOUNTER — Other Ambulatory Visit: Payer: Self-pay | Admitting: Family Medicine

## 2022-02-19 ENCOUNTER — Encounter: Payer: Self-pay | Admitting: Pharmacist

## 2022-02-19 ENCOUNTER — Ambulatory Visit: Payer: Medicaid Other | Attending: Primary Care | Admitting: Pharmacist

## 2022-02-19 DIAGNOSIS — E1165 Type 2 diabetes mellitus with hyperglycemia: Secondary | ICD-10-CM | POA: Diagnosis not present

## 2022-02-19 DIAGNOSIS — Z794 Long term (current) use of insulin: Secondary | ICD-10-CM

## 2022-02-19 LAB — POCT GLYCOSYLATED HEMOGLOBIN (HGB A1C): HbA1c, POC (controlled diabetic range): 10.6 % — AB (ref 0.0–7.0)

## 2022-02-19 NOTE — Progress Notes (Signed)
S:     No chief complaint on file.  Cheryl Parker is a 41 y.o. female who presents for diabetes evaluation, education, and management. PMH is significant for HTN, K9FG (complicated w/ hx of DKA). Patient was referred and last seen by Primary Care Provider, Juluis Mire, on 10/19/2021. I saw her in August and changed Trulicity to Ozempic d/t intolerance with the former. We also stopped metformin d/t GI intolerance.   Today, patient arrives in good spirits and presents without any assistance.   Patient reports Diabetes is longstanding. Since I saw her last, she didn't start Ozempic. She has only been using insulin because she is trying to become pregnant and thought she couldn't take Ozempic. She denies any complaints today.   Family/Social History:  -Fhx: DM, kidney disease  -Tobacco: former smoker  -Alcohol: none reported   Current diabetes medications include: Lantus 68 units daily, Ozempic 0.25 mg weekly (not taking) Patient reports adherence to taking Lantus but not Ozempic.  Insurance coverage: [place] Medicaid   Patient denies hypoglycemic events.  Reported home fasting blood sugars: low 200s since last seeing me.    Patient denies nocturia (nighttime urination).  Patient denies neuropathy (nerve pain). Patient reports visual changes. Patient reports self foot exams.   Patient reported dietary habits:  -Denies any changes since I saw her last -Previously reported dietary habits are as follows:  Eats 2 meals/day Breakfast: [place] bacon, eggs, piece of bread, may have cereal Lunch: skips lately, usually eats leftovers otherwise Dinner: burger, hot dog, green beans, salad, usually eats at home Snacks: crackers, chips, apple, orange Drinks: water, sometimes coffee, stopped drinking sodas 1.5 years ago  Patient-reported exercise habits: on her feet daily with childcare, walks a few minutes daily    O:   ROS  Physical Exam  7 day average blood glucose: no meter  with her today  No CGM with her today   Lab Results  Component Value Date   HGBA1C 10.6 (A) 02/19/2022   There were no vitals filed for this visit.  Lipid Panel     Component Value Date/Time   CHOL 224 (H) 06/25/2021 1133   TRIG 69 06/25/2021 1133   HDL 49 06/25/2021 1133   CHOLHDL 4.6 (H) 06/25/2021 1133   CHOLHDL 9.2 12/27/2016 0343   VLDL 19 12/27/2016 0343   LDLCALC 163 (H) 06/25/2021 1133    Clinical Atherosclerotic Cardiovascular Disease (ASCVD): No  The 10-year ASCVD risk score (Arnett DK, et al., 2019) is: 3%   Values used to calculate the score:     Age: 74 years     Sex: Female     Is Non-Hispanic African American: Yes     Diabetic: Yes     Tobacco smoker: No     Systolic Blood Pressure: 182 mmHg     Is BP treated: Yes     HDL Cholesterol: 49 mg/dL     Total Cholesterol: 224 mg/dL   Patient is participating in a Managed Medicaid Plan:  Yes   A/P: Diabetes longstanding currently uncontrolled. A1c today remains elevated. Patient is able to verbalize appropriate hypoglycemia management plan. Medication adherence appears appropriate. She cannot tolerate metformin, Trulicity. Will have her start Ozempic and continue same dose of insulin. Will plan on increasing Ozempic dose and decreasing insulin at follow-up. She is not currently pregnant but I have informed her to let me know if she gets a positive test result at home. -Continue Lantus 68u daily.  -Started Ozempic 0.25 mg  weekly.  -Patient educated on purpose, proper use, and potential adverse effects of Ozempic.  -Extensively discussed pathophysiology of diabetes, recommended lifestyle interventions, dietary effects on blood sugar control.  -Counseled on s/sx of and management of hypoglycemia.  -Next A1c anticipated 03/2022.   Written patient instructions provided. Patient verbalized understanding of treatment plan. Total time in face to face counseling 30 minutes.    Follow-up:  Pharmacist in December.  Butch Penny, PharmD, [place], CPP Clinical Pharmacist [facility] [phone]

## 2022-03-17 ENCOUNTER — Ambulatory Visit (INDEPENDENT_AMBULATORY_CARE_PROVIDER_SITE_OTHER): Payer: Medicaid Other | Admitting: Primary Care

## 2022-03-26 ENCOUNTER — Other Ambulatory Visit: Payer: Self-pay

## 2022-03-26 ENCOUNTER — Emergency Department (HOSPITAL_BASED_OUTPATIENT_CLINIC_OR_DEPARTMENT_OTHER)
Admission: EM | Admit: 2022-03-26 | Discharge: 2022-03-26 | Disposition: A | Discharge status: Home / Self Care | Payer: Medicaid Other | Attending: Emergency Medicine | Admitting: Emergency Medicine

## 2022-03-26 ENCOUNTER — Encounter (HOSPITAL_BASED_OUTPATIENT_CLINIC_OR_DEPARTMENT_OTHER): Payer: Self-pay | Admitting: Emergency Medicine

## 2022-03-26 DIAGNOSIS — U071 COVID-19: Secondary | ICD-10-CM | POA: Diagnosis not present

## 2022-03-26 DIAGNOSIS — E119 Type 2 diabetes mellitus without complications: Secondary | ICD-10-CM | POA: Diagnosis not present

## 2022-03-26 DIAGNOSIS — Z794 Long term (current) use of insulin: Secondary | ICD-10-CM | POA: Diagnosis not present

## 2022-03-26 DIAGNOSIS — R059 Cough, unspecified: Secondary | ICD-10-CM | POA: Diagnosis present

## 2022-03-26 LAB — URINALYSIS, ROUTINE W REFLEX MICROSCOPIC
Bilirubin Urine: NEGATIVE
Glucose, UA: >=500 mg/dL — AB
Hgb urine dipstick: NEGATIVE
Ketones, ur: 15 mg/dL — AB
Leukocytes,Ua: NEGATIVE
Nitrite: NEGATIVE
Protein, ur: NEGATIVE mg/dL
Specific Gravity, Urine: 1.025 (ref 1.005–1.030)
pH: 6 (ref 5.0–8.0)

## 2022-03-26 LAB — RESP PANEL BY RT-PCR (FLU A&B, COVID) ARPGX2
Influenza A by PCR: NEGATIVE
Influenza B by PCR: NEGATIVE
SARS Coronavirus 2 by RT PCR: POSITIVE — AB

## 2022-03-26 LAB — URINALYSIS, MICROSCOPIC (REFLEX)
RBC / HPF: NONE SEEN RBC/hpf (ref 0–5)
WBC, UA: NONE SEEN WBC/hpf (ref 0–5)

## 2022-03-26 LAB — PREGNANCY, URINE: Preg Test, Ur: NEGATIVE

## 2022-03-26 LAB — CBG MONITORING, ED: Glucose-Capillary: 288 mg/dL — ABNORMAL HIGH (ref 70–99)

## 2022-03-26 MED ORDER — CETIRIZINE HCL 10 MG PO TABS: 10.0000 mg | ORAL_TABLET | Freq: Every day | ORAL | 0 refills | Status: DC

## 2022-03-26 MED ORDER — FLUTICASONE PROPIONATE 50 MCG/ACT NA SUSP: 2.0000 | Freq: Every day | NASAL | 0 refills | Status: DC

## 2022-03-26 MED ORDER — ONDANSETRON 4 MG PO TBDP: 4.0000 mg | ORAL_TABLET | Freq: Three times a day (TID) | ORAL | 0 refills | Status: DC | PRN

## 2022-03-26 NOTE — ED Triage Notes (Signed)
Adds 2 months late , wish to get preg test

## 2022-03-26 NOTE — ED Provider Notes (Signed)
[place] EMERGENCY DEPARTMENT Provider Note   CSN: [id number] Arrival date & time: 03/26/22  1709     History  Chief Complaint  Patient presents with   Cough    Cheryl Parker is a 41 y.o. female.   Cough  Patient is here with complaints of cough for 7 days.  She denies any hemoptysis chest pain or difficulty breathing.  She also states that she has not had a menstrual cycle for greater than 1 month and like to have a pregnancy test.  She denies any abdominal pain nausea vomiting.  She is not taking her insulin today but states that she plans to go home and take this.  She denies any other associated symptoms.     Home Medications Prior to Admission medications   Medication Sig Start Date End Date Taking? Authorizing Provider  cetirizine (ZYRTEC ALLERGY) 10 MG tablet Take 1 tablet (10 mg total) by mouth daily. 03/26/22  Yes Zadie Deemer S, PA  fluticasone (FLONASE) 50 MCG/ACT nasal spray Place 2 sprays into both nostrils daily for 14 days. 03/26/22 04/09/22 Yes Jammi Morrissette S, PA  ondansetron (ZOFRAN-ODT) 4 MG disintegrating tablet Take 1 tablet (4 mg total) by mouth every 8 (eight) hours as needed for nausea or vomiting. 03/26/22  Yes Tajee Savant, Ova Freshwater S, PA  Accu-Chek Softclix Lancets lancets Check blood sugar three times daily. Dx E11.65 04/29/21   Charlott Rakes, MD  Blood Glucose Monitoring Suppl (ACCU-CHEK GUIDE) w/Device KIT Check blood sugar three times daily. Dx E11.65 04/29/21   Charlott Rakes, MD  glucose blood (ACCU-CHEK GUIDE) test strip Check blood sugar three times daily. Dx E11.65 04/29/21   Charlott Rakes, MD  insulin glargine (LANTUS SOLOSTAR) 100 UNIT/ML Solostar Pen Inject 60 Units into the skin daily. May increase by 2 units every 3 days up to a max of 66 units daily if fasting CBGs are >130. 07/03/21   Kerin Perna, NP  Insulin Pen Needle (PEN NEEDLES) 30G X 8 MM MISC 1 pen by Does not apply route 2 (two) times daily. 07/03/21   Kerin Perna, NP  Insulin Syringe-Needle U-100 (INSULIN SYRINGE 1CC/30GX1/2") 30G X 1/2" 1 ML MISC 1 Bag by Does not apply route 4 (four) times daily -  before meals and at bedtime. 03/30/21   Kerin Perna, NP  lisinopril (ZESTRIL) 10 MG tablet Take 1 tablet (10 mg total) by mouth daily. 10/02/21   Charlott Rakes, MD  metFORMIN (GLUCOPHAGE XR) 500 MG 24 hr tablet Take 1 tablet (500 mg total) by mouth daily after supper. 06/01/21   Charlott Rakes, MD  rosuvastatin (CRESTOR) 20 MG tablet Take 1 tablet (20 mg total) by mouth daily. 12/04/21   Charlott Rakes, MD  Semaglutide,0.25 or 0.5MG/DOS, (OZEMPIC, 0.25 OR 0.5 MG/DOSE,) 2 MG/3ML SOPN Inject 0.5 mg into the skin once a week. 02/18/22   Charlott Rakes, MD      Allergies    Patient has no known allergies.    Review of Systems   Review of Systems  Respiratory:  Positive for cough.     Physical Exam Updated Vital Signs BP 135/78   Pulse (!) 106   Temp 98.1 F (36.7 C) (Oral)   Resp 18   Wt 80 kg   LMP 01/21/2022 (Approximate)   SpO2 99%   BMI 32.26 kg/m  Physical Exam Vitals and nursing note reviewed.  Constitutional:      General: She is not in acute distress.    Appearance: She  is obese.  HENT:     Head: Normocephalic and atraumatic.     Nose: Nose normal.     Mouth/Throat:     Mouth: Mucous membranes are moist.  Eyes:     General: No scleral icterus. Cardiovascular:     Rate and Rhythm: Normal rate and regular rhythm.     Pulses: Normal pulses.     Heart sounds: Normal heart sounds.     Comments: HR 95 Pulmonary:     Effort: Pulmonary effort is normal. No respiratory distress.     Breath sounds: Normal breath sounds. No wheezing or rales.  Abdominal:     Palpations: Abdomen is soft.     Tenderness: There is no abdominal tenderness. There is no guarding or rebound.  Musculoskeletal:     Cervical back: Normal range of motion.     Right lower leg: No edema.     Left lower leg: No edema.  Skin:    General: Skin  is warm and dry.     Capillary Refill: Capillary refill takes less than 2 seconds.  Neurological:     Mental Status: She is alert. Mental status is at baseline.  Psychiatric:        Mood and Affect: Mood normal.        Behavior: Behavior normal.     ED Results / Procedures / Treatments   Labs (all labs ordered are listed, but only abnormal results are displayed) Labs Reviewed  RESP PANEL BY RT-PCR (FLU A&B, COVID) ARPGX2 - Abnormal; Notable for the following components:      Result Value   SARS Coronavirus 2 by RT PCR POSITIVE (*)    All other components within normal limits  URINALYSIS, ROUTINE W REFLEX MICROSCOPIC - Abnormal; Notable for the following components:   APPearance HAZY (*)    Glucose, UA >=500 (*)    Ketones, ur 15 (*)    All other components within normal limits  URINALYSIS, MICROSCOPIC (REFLEX) - Abnormal; Notable for the following components:   Bacteria, UA RARE (*)    All other components within normal limits  CBG MONITORING, ED - Abnormal; Notable for the following components:   Glucose-Capillary 288 (*)    All other components within normal limits  PREGNANCY, URINE    EKG None  Radiology No results found.  Procedures Procedures    Medications Ordered in ED Medications - No data to display  ED Course/ Medical Decision Making/ A&P                           Medical Decision Making Amount and/or Complexity of Data Reviewed Labs: ordered.   Patient is here with complaints of cough for 7 days.  She denies any hemoptysis chest pain or difficulty breathing.  She also states that she has not had a menstrual cycle for greater than 1 month and like to have a pregnancy test.  She denies any abdominal pain nausea vomiting.  She is not taking her insulin today but states that she plans to go home and take this.  She denies any other associated symptoms.   Patient has reassuring physical exam no tachycardia or vital sign abnormalities on my exam.   Satting 99% on room air.  Patient is known diabetic and has urinalysis that shows hyperglycemia and few ketones.  CBG elevated at 288 however patient states that this is not horribly abnormal for her she denies any polyuria polydipsia and states that  she feels well will take her home insulin I doubt that the patient is in DKA based on her clinical presentation and her symptoms.  Urine pregnancy test is negative.  CBG 288.  COVID test positive.  She has been symptomatic for 7 days will not prescribe Paxlovid  Will discharge home recommend insulin at home, hydration, follow-up with PCP return precautions were discussed.  Patient ambulatory to discharge.  Final Clinical Impression(s) / ED Diagnoses Final diagnoses:  COVID-19  Type 2 diabetes mellitus without complication, with long-term current use of insulin ([place])    Rx / DC Orders ED Discharge Orders          Ordered    cetirizine (ZYRTEC ALLERGY) 10 MG tablet  Daily        03/26/22 1859    fluticasone (FLONASE) 50 MCG/ACT nasal spray  Daily        03/26/22 1859    ondansetron (ZOFRAN-ODT) 4 MG disintegrating tablet  Every 8 hours PRN        03/26/22 1859              Tedd Sias, PA 03/26/22 1925    Margette Fast, MD 04/01/22 1511

## 2022-03-26 NOTE — ED Triage Notes (Signed)
Cough x 10 days . No further symptoms.

## 2022-03-26 NOTE — Discharge Instructions (Addendum)
Your blood sugar is elevated here you will need to take your insulin as prescribed  Take Zofran as needed for any nausea, hydrate really well, take Zyrtec once daily, fluticasone 2 sprays in each nostril twice daily   You will need to follow back up with your primary care doctor to have your blood sugars rechecked.  You tested positive for COVID-19.  Since your symptoms have been going on for a whole week there is no indication to use antivirals as they do not help at this point.  Tylenol ibuprofen as discussed below  Please use Tylenol or ibuprofen for pain.  You may use 600 mg ibuprofen every 6 hours or 1000 mg of Tylenol every 6 hours.  You may choose to alternate between the 2.  This would be most effective.  Not to exceed 4 g of Tylenol within 24 hours.  Not to exceed 3200 mg ibuprofen 24 hours.   Warm tea and honey  Viral Illness TREATMENT  Treatment is directed at relieving symptoms. There is no cure. Antibiotics are not effective, because the infection is caused by a virus, not by bacteria. Treatment may include:  Increased fluid intake. Sports drinks offer valuable electrolytes, sugars, and fluids.  Breathing heated mist or steam (vaporizer or shower).  Eating chicken soup or other clear broths, and maintaining good nutrition.  Getting plenty of rest.  Using gargles or lozenges for comfort.  Increasing usage of your inhaler if you have asthma.  Return to work when your temperature has returned to normal.  Gargle warm salt water and spit it out for sore throat. Take benadryl to decrease sinus secretions. Continue to alternate between Tylenol and ibuprofen for pain and fever control.  Follow Up: Follow up with your primary care doctor in 5-7 days for recheck of ongoing symptoms.  Return to emergency department for emergent changing or worsening of symptoms.

## 2022-03-30 ENCOUNTER — Telehealth: Payer: Self-pay | Admitting: *Deleted

## 2022-03-30 NOTE — Patient Outreach (Signed)
  Care Coordination [facility] Note Transition Care Management Unsuccessful Follow-up Telephone Call  Date of discharge and from where:  03/26/22 from [facility]  Attempts:  1st Attempt  Reason for unsuccessful TCM follow-up call:  Left voice message   Estanislado Emms RN, BSN   [facility] RN Care Coordinator

## 2022-04-05 ENCOUNTER — Encounter (INDEPENDENT_AMBULATORY_CARE_PROVIDER_SITE_OTHER): Payer: Self-pay | Admitting: Primary Care

## 2022-04-05 ENCOUNTER — Ambulatory Visit (INDEPENDENT_AMBULATORY_CARE_PROVIDER_SITE_OTHER): Payer: Medicaid Other | Admitting: Primary Care

## 2022-04-05 VITALS — BP 144/90 | HR 90 | Resp 16 | Ht 62.0 in | Wt 172.0 lb

## 2022-04-05 DIAGNOSIS — U071 COVID-19: Secondary | ICD-10-CM | POA: Diagnosis not present

## 2022-04-05 NOTE — Progress Notes (Signed)
[place]  Cheryl Parker, is a 41 y.o. female  MCN:[id number]  OQH:[id number]  DOB - Feb 26, 1981  Chief Complaint  Patient presents with   Hospitalization Follow-up       Subjective:   Cheryl Parker is a 40 y.o. female here today for a follow up visit for COVID . No medication was prescribed for COVID  only antihistamine, antiemetic and nasal spray. Patient has No headache, No chest pain, No abdominal pain - No Nausea, No new weakness tingling or numbness, shortness of breath. She still has a cough no fever or chills. Bp elevated has not been taking medication while sick not been able to eat.   No problems updated.  No Known Allergies  Past Medical History:  Diagnosis Date   DKA (diabetic ketoacidoses) 09/10/2014; 06/02/2017   GERD (gastroesophageal reflux disease)    Gestational diabetes    Headache    Hypertension    Nausea & vomiting 07/2017   Pregnancy induced hypertension    Type 1 diabetes mellitus (HCC)    Insulin dependent    Current Outpatient Medications on File Prior to Visit  Medication Sig Dispense Refill   Accu-Chek Softclix Lancets lancets Check blood sugar three times daily. Dx E11.65 100 each 12   Blood Glucose Monitoring Suppl (ACCU-CHEK GUIDE) w/Device KIT Check blood sugar three times daily. Dx E11.65 1 kit 0   cetirizine (ZYRTEC ALLERGY) 10 MG tablet Take 1 tablet (10 mg total) by mouth daily. 30 tablet 0   fluticasone (FLONASE) 50 MCG/ACT nasal spray Place 2 sprays into both nostrils daily for 14 days. 11.1 mL 0   glucose blood (ACCU-CHEK GUIDE) test strip Check blood sugar three times daily. Dx E11.65 100 each 12   insulin glargine (LANTUS SOLOSTAR) 100 UNIT/ML Solostar Pen Inject 60 Units into the skin daily. May increase by 2 units every 3 days up to a max of 66 units daily if fasting CBGs are >130. 15 mL PRN   Insulin Pen Needle (PEN NEEDLES) 30G X 8 MM MISC 1 pen by Does not apply route 2 (two) times daily. 100 each 11    Insulin Syringe-Needle U-100 (INSULIN SYRINGE 1CC/30GX1/2") 30G X 1/2" 1 ML MISC 1 Bag by Does not apply route 4 (four) times daily -  before meals and at bedtime. 100 each 6   lisinopril (ZESTRIL) 10 MG tablet Take 1 tablet (10 mg total) by mouth daily. 90 tablet 0   metFORMIN (GLUCOPHAGE XR) 500 MG 24 hr tablet Take 1 tablet (500 mg total) by mouth daily after supper. 90 tablet 1   ondansetron (ZOFRAN-ODT) 4 MG disintegrating tablet Take 1 tablet (4 mg total) by mouth every 8 (eight) hours as needed for nausea or vomiting. 20 tablet 0   rosuvastatin (CRESTOR) 20 MG tablet Take 1 tablet (20 mg total) by mouth daily. 90 tablet 0   Semaglutide,0.25 or 0.5MG/DOS, (OZEMPIC, 0.25 OR 0.5 MG/DOSE,) 2 MG/3ML SOPN Inject 0.5 mg into the skin once a week. 3 mL 1   No current facility-administered medications on file prior to visit.    Objective:   Vitals:   04/05/22 1515  BP: (Abnormal) 144/90  Pulse: 90  Resp: 16  SpO2: 100%  Weight: 172 lb (78 kg)  Height: 5' 2" (1.575 m)    Exam General appearance : Awake, alert, not in any distress. Speech Clear. Not toxic looking HEENT: Atraumatic and Normocephalic, pupils equally reactive to light and accomodation Neck: Supple, no JVD. No cervical lymphadenopathy.  Chest:  Good air entry bilaterally, no added sounds  CVS: S1 S2 regular, no murmurs.  Abdomen: Bowel sounds present, Non tender and not distended with no gaurding, rigidity or rebound. Extremities: B/L Lower Ext shows no edema, both legs are warm to touch Neurology: Awake alert, and, Non focal Skin: No Rash  Data Review Lab Results  Component Value Date   HGBA1C 10.6 (A) 02/19/2022   HGBA1C 10.1 (A) 10/19/2021   HGBA1C 9.0 (A) 06/25/2021    Assessment & Plan  Cheryl Parker was seen today for hospitalization follow-up.  Diagnoses and all orders for this visit:  COVID Tested in UC better cough remains.  Patient have been counseled extensively about nutrition and exercise. Other issues  discussed during this visit include: low cholesterol diet, weight control and daily exercise, foot care, annual eye examinations at Ophthalmology, importance of adherence with medications and regular follow-up. We also discussed long term complications of uncontrolled diabetes and hypertension.   Return in about 2 months (around 06/06/2022) for DM/HTN.  The patient was given clear instructions to go to ER or return to medical center if symptoms don't improve, worsen or new problems develop. The patient verbalized understanding. The patient was told to call to get lab results if they haven't heard anything in the next week.   This note has been created with Surveyor, quantity. Any transcriptional errors are unintentional.   Kerin Perna, NP 04/05/2022, 3:45 PM

## 2022-04-16 ENCOUNTER — Ambulatory Visit: Payer: Medicaid Other | Admitting: Pharmacist

## 2022-05-06 ENCOUNTER — Ambulatory Visit: Payer: Medicaid Other | Admitting: Pharmacist

## 2022-05-10 ENCOUNTER — Other Ambulatory Visit (INDEPENDENT_AMBULATORY_CARE_PROVIDER_SITE_OTHER): Payer: Self-pay | Admitting: Primary Care

## 2022-06-01 ENCOUNTER — Other Ambulatory Visit: Payer: Self-pay

## 2022-06-01 ENCOUNTER — Ambulatory Visit: Payer: Medicaid Other | Attending: Family Medicine | Admitting: Pharmacist

## 2022-06-01 DIAGNOSIS — E1165 Type 2 diabetes mellitus with hyperglycemia: Secondary | ICD-10-CM | POA: Diagnosis not present

## 2022-06-01 DIAGNOSIS — I10 Essential (primary) hypertension: Secondary | ICD-10-CM

## 2022-06-01 DIAGNOSIS — Z794 Long term (current) use of insulin: Secondary | ICD-10-CM

## 2022-06-01 MED ORDER — METFORMIN HCL ER 500 MG PO TB24
500.0000 mg | ORAL_TABLET | Freq: Every day | ORAL | 1 refills | Status: DC
  Filled 2022-06-01: qty 90, 90d supply, fill #0

## 2022-06-01 MED ORDER — ROSUVASTATIN CALCIUM 20 MG PO TABS
20.0000 mg | ORAL_TABLET | Freq: Every day | ORAL | 0 refills | Status: DC
  Filled 2022-06-01: qty 90, 90d supply, fill #0

## 2022-06-01 MED ORDER — LISINOPRIL 10 MG PO TABS
10.0000 mg | ORAL_TABLET | Freq: Every day | ORAL | 0 refills | Status: DC
  Filled 2022-06-01: qty 90, 90d supply, fill #0

## 2022-06-01 MED ORDER — LANTUS SOLOSTAR 100 UNIT/ML ~~LOC~~ SOPN
72.0000 [IU] | PEN_INJECTOR | Freq: Every day | SUBCUTANEOUS | 2 refills | Status: DC
  Filled 2022-06-01: qty 30, 41d supply, fill #0

## 2022-06-01 NOTE — Progress Notes (Signed)
S:     No chief complaint on file.  Cheryl Parker is a 42 y.o. female who presents for diabetes evaluation, education, and management. PMH is significant for HTN, H2DJ (complicated w/ hx of DKA). Patient was referred and last seen by Primary Care Provider, Juluis Mire, on 04/05/2022. I saw her back in October. She had not started the Ozempic prior to that visit. I had her start this and continue her Lantus.   Today, patient arrives in good spirits and presents without any assistance.   Patient reports Diabetes is longstanding. Since I saw her last, she had to stop Ozempic d/t nausea. She has only been using insulin and metformin. She denies any complaints today but wishes to reestablish with one of our providers at [facility].   Family/Social History:  -Fhx: DM, kidney disease  -Tobacco: former smoker  -Alcohol: none reported   Current diabetes medications include: Lantus 68 units daily, Ozempic 0.25 mg weekly (not taking), metformin 500 mg XR daily  Insurance coverage: [place] Medicaid   Patient denies hypoglycemic events.  Reported home fasting blood sugars: 120s - 160s since seeing me.  No meter or CGM to verify this.   Patient denies nocturia (nighttime urination).  Patient denies neuropathy (nerve pain). Patient reports visual changes. Patient reports self foot exams.   Patient reported dietary habits:  -Denies any changes since I saw her last -Previously reported dietary habits are as follows:  Eats 2 meals/day Breakfast: [place] bacon, eggs, piece of bread, may have cereal Lunch: skips lately, usually eats leftovers otherwise Dinner: burger, hot dog, green beans, salad, usually eats at home Snacks: crackers, chips, apple, orange Drinks: water, sometimes coffee, stopped drinking sodas 1.5 years ago  Patient-reported exercise habits: on her feet daily with childcare, walks a few minutes daily    O:   ROS  Physical Exam  7 day average blood glucose: no meter with  her today  No CGM with her today   Lab Results  Component Value Date   HGBA1C 10.6 (A) 02/19/2022   There were no vitals filed for this visit.  Lipid Panel     Component Value Date/Time   CHOL 224 (H) 06/25/2021 1133   TRIG 69 06/25/2021 1133   HDL 49 06/25/2021 1133   CHOLHDL 4.6 (H) 06/25/2021 1133   CHOLHDL 9.2 12/27/2016 0343   VLDL 19 12/27/2016 0343   LDLCALC 163 (H) 06/25/2021 1133    Clinical Atherosclerotic Cardiovascular Disease (ASCVD): No  The 10-year ASCVD risk score (Arnett DK, et al., 2019) is: 10.8%   Values used to calculate the score:     Age: 52 years     Sex: Female     Is Non-Hispanic African American: Yes     Diabetic: Yes     Tobacco smoker: No     Systolic Blood Pressure: 242 mmHg     Is BP treated: Yes     HDL Cholesterol: 49 mg/dL     Total Cholesterol: 224 mg/dL   Patient is participating in a Managed Medicaid Plan:  Yes   A/P: Diabetes longstanding currently uncontrolled, but we need an updated A1c. Reported sugars are better but will need to verify this before we make any changes. Patient is able to verbalize appropriate hypoglycemia management plan. Medication adherence appears appropriate but she cannot tolerate GLP-1 RA therapy. -Continue Lantus 72u daily. -Continue metformin 500 mg XR daily.  -Patient educated on purpose, proper use, and potential adverse effects of Ozempic.  -Extensively discussed  pathophysiology of diabetes, recommended lifestyle interventions, dietary effects on blood sugar control.  -Counseled on s/sx of and management of hypoglycemia.  -A1c today.  Written patient instructions provided. Patient verbalized understanding of treatment plan. Total time in face to face counseling 30 minutes.    Follow-up:  Pharmacist in in 1 month.  Benard Halsted, PharmD, [place], [place] [phone]

## 2022-06-02 ENCOUNTER — Other Ambulatory Visit: Payer: Self-pay | Admitting: Family Medicine

## 2022-06-02 DIAGNOSIS — E1165 Type 2 diabetes mellitus with hyperglycemia: Secondary | ICD-10-CM

## 2022-06-02 LAB — HEMOGLOBIN A1C
Est. average glucose Bld gHb Est-mCnc: 260 mg/dL
Hgb A1c MFr Bld: 10.7 % — ABNORMAL HIGH (ref 4.8–5.6)

## 2022-06-03 ENCOUNTER — Other Ambulatory Visit: Payer: Self-pay

## 2022-06-07 ENCOUNTER — Other Ambulatory Visit: Payer: Self-pay

## 2022-06-28 ENCOUNTER — Telehealth: Payer: Self-pay | Admitting: General Practice

## 2022-06-28 ENCOUNTER — Telehealth: Payer: Self-pay | Admitting: *Deleted

## 2022-06-28 NOTE — Telephone Encounter (Signed)
Pt is calling to reschedule appt with Lurena Joiner for Tomorrow. Please advise BC= [phone]

## 2022-06-28 NOTE — Telephone Encounter (Signed)
Pt is calling to reschedule appt with Lurena Joiner for Tomorrow. Please advise BC= [phone]

## 2022-06-29 ENCOUNTER — Ambulatory Visit: Payer: Medicaid Other | Admitting: Pharmacist

## 2022-07-12 ENCOUNTER — Ambulatory Visit: Payer: Medicaid Other | Attending: Family Medicine | Admitting: Family Medicine

## 2022-07-12 ENCOUNTER — Encounter: Payer: Self-pay | Admitting: Family Medicine

## 2022-07-12 VITALS — BP 114/78 | HR 99 | Ht 62.0 in | Wt 174.6 lb

## 2022-07-12 DIAGNOSIS — E1165 Type 2 diabetes mellitus with hyperglycemia: Secondary | ICD-10-CM

## 2022-07-12 DIAGNOSIS — I10 Essential (primary) hypertension: Secondary | ICD-10-CM | POA: Diagnosis not present

## 2022-07-12 DIAGNOSIS — Z23 Encounter for immunization: Secondary | ICD-10-CM | POA: Diagnosis not present

## 2022-07-12 DIAGNOSIS — E1169 Type 2 diabetes mellitus with other specified complication: Secondary | ICD-10-CM | POA: Diagnosis not present

## 2022-07-12 DIAGNOSIS — I739 Peripheral vascular disease, unspecified: Secondary | ICD-10-CM | POA: Diagnosis not present

## 2022-07-12 DIAGNOSIS — E785 Hyperlipidemia, unspecified: Secondary | ICD-10-CM | POA: Diagnosis not present

## 2022-07-12 DIAGNOSIS — Z794 Long term (current) use of insulin: Secondary | ICD-10-CM | POA: Diagnosis not present

## 2022-07-12 LAB — POCT ABI - SCREENING FOR PILOT NO CHARGE

## 2022-07-12 MED ORDER — CILOSTAZOL 50 MG PO TABS: 50.0000 mg | ORAL_TABLET | Freq: Two times a day (BID) | ORAL | 0 refills | Status: DC

## 2022-07-12 MED ORDER — INSULIN GLARGINE SOLOSTAR 100 UNIT/ML ~~LOC~~ SOPN: 78.0000 [IU] | PEN_INJECTOR | Freq: Every day | SUBCUTANEOUS | 2 refills | Status: DC

## 2022-07-12 MED ORDER — ATORVASTATIN CALCIUM 20 MG PO TABS: 20.0000 mg | ORAL_TABLET | Freq: Every day | ORAL | 1 refills | Status: DC

## 2022-07-12 MED ORDER — NOVOLOG FLEXPEN 100 UNIT/ML ~~LOC~~ SOPN: 0.0000 [IU] | PEN_INJECTOR | Freq: Three times a day (TID) | SUBCUTANEOUS | 11 refills | Status: DC

## 2022-07-12 MED ORDER — ATORVASTATIN CALCIUM 40 MG PO TABS: 40.0000 mg | ORAL_TABLET | Freq: Every day | ORAL | 1 refills | Status: DC

## 2022-07-12 NOTE — Patient Instructions (Signed)
Peripheral Vascular Disease  Peripheral vascular disease (PVD) is a disease of the blood vessels that carry blood from the heart to the rest of the body. PVD is also called peripheral artery disease (PAD) or poor circulation. PVD affects most of the body. But it affects the legs and feet the most. PVD can lead to acute limb ischemia. This happens when there is a sudden stop of blood flow to an arm or leg. This is a medical emergency. What are the causes? The most common cause of PVD is a buildup of a fatty substance (plaque) inside your arteries. This decreases blood flow. Plaque can break off and block blood in a smaller artery. This can lead to acute limb ischemia. Other common causes of PVD include: Blood clots inside the blood vessels. Injuries to blood vessels. Irritation and swelling of blood vessels. Sudden tightening of the blood vessel (spasms). What increases the risk? A family history of PVD. Medical conditions, including: High cholesterol. Diabetes. High blood pressure. Heart disease. Past problems with blood clots. Past injury, such as burns or a broken bone. Other conditions, such as: Buerger's disease. This is caused by swollen or irritated blood vessels in your hands and feet. Arthritis. Birth defects that affect the arteries in your legs. Kidney disease. Using tobacco or nicotine products. Not getting enough exercise. Being very overweight (obese). Being 50 years old or older. What are the signs or symptoms? Cramps in your butt, legs, and feet. Pain and weakness in your legs when you are active that goes away when you rest. Leg pain when at rest. Leg numbness, tingling, or weakness. Coldness in a leg or foot, especially when compared with the other leg or foot. Skin or hair changes. These can include: Hair loss. Shiny skin. Pale or bluish skin. Thick toenails. Being unable to get or keep an erection. Tiredness (fatigue). Weak pulse or no pulse in the  feet. Wounds and sores on the toes, feet, or legs. These take longer to heal. How is this treated? Underlying causes are treated first. Other conditions, like diabetes, high cholesterol, and blood pressure, are also treated. Treatment may include: Lifestyle changes, such as: Quitting smoking. Getting regular exercise. Having a diet low in fat and cholesterol. Not drinking alcohol. Taking medicines, such as: Blood thinners. Medicines to improve blood flow. Medicines to improve your blood cholesterol. Procedures to: Open the arteries and restore blood flow. Insert a small mesh tube (stent) to keep a blocked vessel open. Create a new path for blood to flow to the body (peripheral bypass). Remove dead tissue from a wound. Remove an affected leg or arm. Follow these instructions at home: Medicines Take over-the-counter and prescription medicines only as told by your doctor. If you are taking blood thinners: Talk with your doctor before you take any medicines that have aspirin, or NSAIDs, such as ibuprofen. Take medicines exactly as told. Take them at the same time each day. Avoid doing things that could hurt or bruise you. Take action to prevent falls. Wear an alert bracelet or carry a card that shows you are taking blood thinners. Lifestyle     Get regular exercise. Ask your doctor about how to stay active. Talk with your doctor about keeping a healthy weight. If needed, ask about losing weight. Eat a diet that is low in fat and cholesterol. If you need help, talk with your doctor. Do not drink alcohol. Do not smoke or use any products that contain nicotine or tobacco. If you need help   quitting, ask your doctor. General instructions Take good care of your feet. To do this: Wear shoes that fit well and feel good. Check your feet often for any cuts or sores. Get a flu shot (influenza vaccine) each year. Keep all follow-up visits. Where to find more information Society for  Vascular Surgery: vascular.org American Heart Association: heart.org National Heart, Lung, and Blood Institute: nhlbi.nih.gov Contact a doctor if: You have cramps in your legs when you walk. You have leg pain when you rest. Your leg or foot feels cold. Your skin changes. You cannot get or keep an erection. You have cuts or sores on your legs or feet that do not heal. Get help right away if: You have sudden changes in the color and feeling of your arms or legs, such as: Your arm or leg turns cold, numb, and blue. Your arm or leg becomes red, warm, swollen, painful, or numb. You have any signs of a stroke. "BE FAST" is an easy way to remember the main warning signs: B - Balance. Dizziness, sudden trouble walking, or loss of balance. E - Eyes. Trouble seeing or a change in how you see. F - Face. Sudden weakness or loss of feeling of the face. The face or eyelid may droop on one side. A - Arms. Weakness or loss of feeling in an arm. This happens all of a sudden and most often on one side of the body. S - Speech. Sudden trouble speaking, slurred speech, or trouble understanding what people say. T - Time. Time to call emergency services. Write down what time symptoms started. You have other signs of a stroke, such as: A sudden, very bad headache with no known cause. Feeling like you may vomit (nausea). Vomiting. A seizure. You have chest pain or trouble breathing. These symptoms may be an emergency. Get help right away. Call your local emergency services (911 in the [place] Do not wait to see if the symptoms will go away. Do not drive yourself to the hospital. Summary Peripheral vascular disease (PVD) is a disease of the blood vessels. PVD affects the legs and feet the most. Symptoms may include leg pain or leg numbness, tingling, and weakness. Treatment may include lifestyle changes, medicines, and procedures. This information is not intended to replace advice given to you by your health  care provider. Make sure you discuss any questions you have with your health care provider. Document Revised: 10/15/2019 Document Reviewed: 10/15/2019 Elsevier Patient Education  2023 Elsevier Inc.  

## 2022-07-12 NOTE — Progress Notes (Unsigned)
Subjective:  Patient ID: [name], female    DOB: 1980-12-10  Age: 42 y.o. MRN: [id number]  CC: Establish Care   HPI Cheryl Parker is a 42 y.o. year old female with a history of type 2 diabetes mellitus (A1c 7.7), hypertension here to establish care.  Interval History:  Blood sugars have been 230-270 and he has been administering 74 units qhs. Random sugars are 230s.  She has been seeing the clinical pharmacist but never establish with a PCP. Last eye exam was last year. She Complains of Crestor causing her to have the urge to defaecate but this does not actually happen.  She has not been adherent with her Crestor. Endorses adherence with lisinopril. She complains of some erythema noticed in her extremities especially her legs but no pain. Past Medical History:  Diagnosis Date   DKA (diabetic ketoacidoses) 09/10/2014; 06/02/2017   GERD (gastroesophageal reflux disease)    Gestational diabetes    Headache    Hypertension    Nausea & vomiting 07/2017   Pregnancy induced hypertension    Type 1 diabetes mellitus ([place])    Insulin dependent    Past Surgical History:  Procedure Laterality Date   CESAREAN SECTION  2011   CESAREAN SECTION N/A 07/05/2012   Procedure: CESAREAN SECTION;  Surgeon: Mora Bellman, MD;  Location: [facility];  Service: Obstetrics;  Laterality: N/A;   CESAREAN SECTION N/A 06/25/2015   Procedure: CESAREAN SECTION;  Surgeon: Mora Bellman, MD;  Location: [facility];  Service: Obstetrics;  Laterality: N/A;   ESOPHAGOGASTRODUODENOSCOPY (EGD) WITH PROPOFOL Left 12/28/2016   Procedure: ESOPHAGOGASTRODUODENOSCOPY (EGD) WITH PROPOFOL;  Surgeon: Ronnette Juniper, MD;  Location: [facility];  Service: Gastroenterology;  Laterality: Left;    Family History  Problem Relation Age of Onset   Diabetes Mother    Kidney disease Father    Birth defects Son        unilateral renal agenesis    Social History   Socioeconomic History   Marital status: Single    Spouse name:  Not on file   Number of children: Not on file   Years of education: Not on file   Highest education level: Not on file  Occupational History   Not on file  Tobacco Use   Smoking status: Former    Packs/day: 0.10    Years: 17.00    Additional pack years: 0.00    Total pack years: 1.70    Types: Cigarettes   Smokeless tobacco: Never   Tobacco comments:    06/02/2017 "quit ~ 6 months ago"  Vaping Use   Vaping Use: Never used  Substance and Sexual Activity   Alcohol use: No   Drug use: No   Sexual activity: Not on file  Other Topics Concern   Not on file  Social History Narrative   Not on file   Social Determinants of Health   Financial Resource Strain: Not on file  Food Insecurity: Not on file  Transportation Needs: Not on file  Physical Activity: Not on file  Stress: Not on file  Social Connections: Not on file    No Known Allergies  Outpatient Medications Prior to Visit  Medication Sig Dispense Refill   Accu-Chek Softclix Lancets lancets Check blood sugar three times daily. Dx E11.65 100 each 12   Blood Glucose Monitoring Suppl (ACCU-CHEK GUIDE) w/Device KIT Check blood sugar three times daily. Dx E11.65 1 kit 0   glucose blood (ACCU-CHEK GUIDE) test strip Check blood sugar three times daily.  Dx E11.65 100 each 12   Insulin Pen Needle (B-D ULTRAFINE III SHORT PEN) 31G X 8 MM MISC USE FOR INJECTIONS 2 TIMES DAILY AS DIRECTED. 100 each 11   lisinopril (ZESTRIL) 10 MG tablet Take 1 tablet (10 mg total) by mouth daily. 90 tablet 0   Insulin Glargine Solostar (LANTUS) 100 UNIT/ML Solostar Pen Inject 72 Units into the skin daily. 30 mL 2   cetirizine (ZYRTEC ALLERGY) 10 MG tablet Take 1 tablet (10 mg total) by mouth daily. (Patient not taking: Reported on 07/12/2022) 30 tablet 0   fluticasone (FLONASE) 50 MCG/ACT nasal spray Place 2 sprays into both nostrils daily for 14 days. 11.1 mL 0   Insulin Syringe-Needle U-100 (INSULIN SYRINGE 1CC/30GX1/2") 30G X 1/2" 1 ML MISC 1 Bag by  Does not apply route 4 (four) times daily -  before meals and at bedtime. (Patient not taking: Reported on 07/12/2022) 100 each 6   metFORMIN (GLUCOPHAGE-XR) 500 MG 24 hr tablet Take 1 tablet (500 mg total) by mouth daily after supper. (Patient not taking: Reported on 07/12/2022) 90 tablet 1   rosuvastatin (CRESTOR) 20 MG tablet Take 1 tablet (20 mg total) by mouth daily. (Patient not taking: Reported on 07/12/2022) 90 tablet 0   No facility-administered medications prior to visit.     ROS Review of Systems  Constitutional:  Negative for activity change and appetite change.  HENT:  Negative for sinus pressure and sore throat.   Respiratory:  Negative for chest tightness, shortness of breath and wheezing.   Cardiovascular:  Negative for chest pain and palpitations.  Gastrointestinal:  Negative for abdominal distention, abdominal pain and constipation.  Genitourinary: Negative.   Musculoskeletal: Negative.   Skin:  Positive for color change.  Psychiatric/Behavioral:  Negative for behavioral problems and dysphoric mood.     Objective:  BP 114/78   Pulse 99   Ht 5\' 2"  (1.575 m)   Wt 174 lb 9.6 oz (79.2 kg)   SpO2 100%   BMI 31.93 kg/m      07/12/2022    1:54 PM 04/05/2022    3:15 PM 03/26/2022    6:00 PM  BP/Weight  Systolic BP [id number]  Diastolic BP 78 90 78  Wt. (Lbs) 174.6 172   BMI 31.93 kg/m2 31.46 kg/m2       Physical Exam Constitutional:      Appearance: She is well-developed.  Cardiovascular:     Rate and Rhythm: Normal rate.     Heart sounds: Normal heart sounds. No murmur heard. Pulmonary:     Effort: Pulmonary effort is normal.     Breath sounds: Normal breath sounds. No wheezing or rales.  Chest:     Chest wall: No tenderness.  Abdominal:     General: Bowel sounds are normal. There is no distension.     Palpations: Abdomen is soft. There is no mass.     Tenderness: There is no abdominal tenderness.  Musculoskeletal:        General: Normal range of  motion.     Right lower leg: No edema.     Left lower leg: No edema.  Skin:    Comments: Erythema of both legs  Neurological:     Mental Status: She is alert and oriented to person, place, and time.  Psychiatric:        Mood and Affect: Mood normal.     Diabetic Foot Exam - Simple   Simple Foot Form Diabetic Foot exam was performed with  the following findings: Yes 07/12/2022  2:23 PM  Visual Inspection No deformities, no ulcerations, no other skin breakdown bilaterally: Yes Sensation Testing Intact to touch and monofilament testing bilaterally: Yes Pulse Check See comments: Yes Comments Unable to palpate bilateral dorsalis pedis, posterior tibialis pulses Erythema of both legs        Latest Ref Rng & Units 07/12/2022    3:01 PM 06/25/2021   11:33 AM 09/11/2020   10:30 AM  CMP  Glucose 70 - 99 mg/dL 246  171  218   BUN 6 - 24 mg/dL 7  8  9    Creatinine 0.57 - 1.00 mg/dL 0.55  0.57  0.61   Sodium 134 - 144 mmol/L 138  138  137   Potassium 3.5 - 5.2 mmol/L 4.2  4.3  4.5   Chloride 96 - 106 mmol/L 101  101  100   CO2 20 - 29 mmol/L 22  20  21    Calcium 8.7 - 10.2 mg/dL 9.5  9.8  9.3   Total Protein 6.0 - 8.5 g/dL  6.8  6.6   Total Bilirubin 0.0 - 1.2 mg/dL  0.2  0.3   Alkaline Phos 44 - 121 IU/L  185  126   AST 0 - 40 IU/L  32  44   ALT 0 - 32 IU/L  89  55     Lipid Panel     Component Value Date/Time   CHOL 224 (H) 06/25/2021 1133   TRIG 69 06/25/2021 1133   HDL 49 06/25/2021 1133   CHOLHDL 4.6 (H) 06/25/2021 1133   CHOLHDL 9.2 12/27/2016 0343   VLDL 19 12/27/2016 0343   LDLCALC 163 (H) 06/25/2021 1133    CBC    Component Value Date/Time   WBC 9.8 06/25/2021 1133   WBC 8.4 11/11/2019 0621   RBC 4.52 06/25/2021 1133   RBC 3.70 (L) 11/11/2019 0621   HGB 14.1 06/25/2021 1133   HGB 12.8 01/10/2012 0000   HCT 43.1 06/25/2021 1133   PLT 387 06/25/2021 1133   PLT 372 01/10/2012 0000   MCV 95 06/25/2021 1133   MCH 31.2 06/25/2021 1133   MCH 31.4 11/11/2019  0621   MCHC 32.7 06/25/2021 1133   MCHC 32.7 11/11/2019 0621   RDW 11.7 06/25/2021 1133   LYMPHSABS 2.5 06/25/2021 1133   MONOABS 0.8 11/11/2019 0621   EOSABS 0.1 06/25/2021 1133   BASOSABS 0.0 06/25/2021 1133    Lab Results  Component Value Date   HGBA1C 10.7 (H) 06/01/2022    Assessment & Plan:  1. Type 2 diabetes mellitus with hyperglycemia, with long-term current use of insulin (HCC) Uncontrolled with A1c of 10.7 Increase Lantus from 74 units to 70 units nightly NovoLog sliding scale added to regimen Counseled on Diabetic diet, my plate method, [id number] minutes of moderate intensity exercise/week Blood sugar logs with fasting goals of 80-120 mg/dl, random of less than 180 and in the event of sugars less than 60 mg/dl or greater than 400 mg/dl encouraged to notify the clinic. Advised on the need for annual eye exams, annual foot exams, Pneumonia vaccine. - Insulin Glargine Solostar (LANTUS) 100 UNIT/ML Solostar Pen; Inject 78 Units into the skin daily.  Dispense: 30 mL; Refill: 2 - insulin aspart (NOVOLOG FLEXPEN) 100 UNIT/ML FlexPen; Inject 0-12 Units into the skin 3 (three) times daily with meals. Per sliding scale  Dispense: 15 mL; Refill: 11 - Basic Metabolic Panel - Microalbumin/Creatinine Ratio, Urine  2. Peripheral vascular disease (HCC) Erythema of  lower extremities noted Encouraged to initiate statin - POCT ABI Screening for Pilot No Charge - Ambulatory referral to Vascular Surgery - cilostazol (PLETAL) 50 MG tablet; Take 1 tablet (50 mg total) by mouth 2 (two) times daily.  Dispense: 60 tablet; Refill: 0  3. Hyperlipidemia due to type 2 diabetes mellitus ([place]) Unable to tolerate Crestor hence have switched her to Lipitor Low-cholesterol diet - atorvastatin (LIPITOR) 40 MG tablet; Take 1 tablet (40 mg total) by mouth daily.  Dispense: 90 tablet; Refill: 1  4. Essential hypertension Controlled Continue lisinopril Counseled on blood pressure goal of less than 130/80,  low-sodium, DASH diet, medication compliance, 150 minutes of moderate intensity exercise per week. Discussed medication compliance, adverse effects.  5. Need for immunization against influenza - Flu Vaccine QUAD 41mo+IM (Fluarix, Fluzone & Alfiuria Quad PF)   Meds ordered this encounter  Medications   Insulin Glargine Solostar (LANTUS) 100 UNIT/ML Solostar Pen    Sig: Inject 78 Units into the skin daily.    Dispense:  30 mL    Refill:  2    Dose increase   insulin aspart (NOVOLOG FLEXPEN) 100 UNIT/ML FlexPen    Sig: Inject 0-12 Units into the skin 3 (three) times daily with meals. Per sliding scale    Dispense:  15 mL    Refill:  11    For blood sugars 0-150 give 0 units of insulin, 151-200 give 2 units of insulin, 201-250 give 4 units, 251-300 give 6 units, 301-350 give 8 units, 351-400 give 10 units,> 400 give 12 units and call M.D.   DISCONTD: atorvastatin (LIPITOR) 20 MG tablet    Sig: Take 1 tablet (20 mg total) by mouth daily.    Dispense:  90 tablet    Refill:  1    Discontinue Crestor   atorvastatin (LIPITOR) 40 MG tablet    Sig: Take 1 tablet (40 mg total) by mouth daily.    Dispense:  90 tablet    Refill:  1    Discontinue Crestor, discontinue 20mg  atorvastatin   cilostazol (PLETAL) 50 MG tablet    Sig: Take 1 tablet (50 mg total) by mouth 2 (two) times daily.    Dispense:  60 tablet    Refill:  0    Follow-up: Return in about 3 months (around 10/12/2022) for Chronic medical conditions.       Charlott Rakes, MD, FAAFP. [facility] and [facility] [place], [place]   07/13/2022, 8:42 AM

## 2022-07-13 ENCOUNTER — Other Ambulatory Visit: Payer: Self-pay | Admitting: *Deleted

## 2022-07-13 ENCOUNTER — Encounter: Payer: Self-pay | Admitting: Family Medicine

## 2022-07-13 DIAGNOSIS — I739 Peripheral vascular disease, unspecified: Secondary | ICD-10-CM

## 2022-07-13 LAB — BASIC METABOLIC PANEL
BUN/Creatinine Ratio: 13 (ref 9–23)
BUN: 7 mg/dL (ref 6–24)
CO2: 22 mmol/L (ref 20–29)
Calcium: 9.5 mg/dL (ref 8.7–10.2)
Chloride: 101 mmol/L (ref 96–106)
Creatinine, Ser: 0.55 mg/dL — ABNORMAL LOW (ref 0.57–1.00)
Glucose: 246 mg/dL — ABNORMAL HIGH (ref 70–99)
Potassium: 4.2 mmol/L (ref 3.5–5.2)
Sodium: 138 mmol/L (ref 134–144)
eGFR: 117 mL/min/{1.73_m2} (ref >59)

## 2022-07-13 LAB — MICROALBUMIN / CREATININE URINE RATIO
Creatinine, Urine: 66.5 mg/dL
Microalb/Creat Ratio: 42 mg/g creat — ABNORMAL HIGH (ref 0–29)
Microalbumin, Urine: 28.1 ug/mL

## 2022-07-22 ENCOUNTER — Ambulatory Visit: Payer: Medicaid Other | Admitting: Vascular Surgery

## 2022-07-22 ENCOUNTER — Encounter: Payer: Self-pay | Admitting: Vascular Surgery

## 2022-07-22 ENCOUNTER — Ambulatory Visit (HOSPITAL_COMMUNITY)
Admission: RE | Admit: 2022-07-22 | Discharge: 2022-07-22 | Disposition: A | Discharge status: Home / Self Care | Payer: Medicaid Other | Source: Ambulatory Visit | Attending: Vascular Surgery | Admitting: Vascular Surgery

## 2022-07-22 VITALS — BP 136/82 | HR 96 | Temp 98.0°F | Resp 20 | Ht 62.0 in | Wt 176.0 lb

## 2022-07-22 DIAGNOSIS — I70219 Atherosclerosis of native arteries of extremities with intermittent claudication, unspecified extremity: Secondary | ICD-10-CM | POA: Diagnosis not present

## 2022-07-22 DIAGNOSIS — I739 Peripheral vascular disease, unspecified: Secondary | ICD-10-CM | POA: Diagnosis not present

## 2022-07-22 LAB — VAS US ABI WITH/WO TBI
Left ABI: 0.59
Right ABI: 0.89

## 2022-07-22 NOTE — Progress Notes (Signed)
ASSESSMENT & PLAN   PERIPHERAL ARTERIAL DISEASE WITH CLAUDICATION: This patient does have some left leg claudication which is stable.  She has evidence of infrainguinal arterial occlusive disease on the left.  Fortunately she is not a smoker.  She quit 25 years ago.  I encouraged her to stay as active as possible.  I explained that we would typically not consider arteriography and revascularization unless she developed disabling claudication, rest pain, or nonhealing ulcer.  Given that she does have diabetes and significant infrainguinal arterial occlusive disease on the left, I think we should continue to follow this and I have ordered follow-up ABIs in 9 months.  She will be seen on the PA schedule at that time.  She knows to call sooner if she has problems.  With respect to the reddish discoloration on her feet she appears to have a rash of some sort and I would recommend evaluation by dermatology.  REASON FOR CONSULT:    To evaluate for peripheral arterial disease.  The consult was requested by Dr. Margarita Rana.   HPI:   Cheryl Parker is a 42 y.o. female who was referred because of abnormal ABIs.  The patient had reddish discoloration of her feet and Dr. Margarita Rana listened with the Doppler and detected abnormal Doppler signals.  She was sent for vascular consultation.  On my history the patient has had some redness discoloration of both feet but more significantly on the left side.  She dropped a dresser on her left foot 2 years ago and she has had some discoloration along the lateral aspect of her foot since that time.  She does describe some pain in her left foot with ambulation which is relieved with rest.  I do not get any calf or thigh claudication on the left.  She has no claudication symptoms on the right.  She denies any history of rest pain.  She denies any history of nonhealing wounds.  Her risk factors for peripheral arterial disease include type 2 diabetes, hypertension, and  hypercholesterolemia.  She quit tobacco about 25 years ago.  She does not have any family history of premature cardiovascular disease.    Past Medical History:  Diagnosis Date   DKA (diabetic ketoacidoses) 09/10/2014; 06/02/2017   GERD (gastroesophageal reflux disease)    Gestational diabetes    Headache    Hypertension    Nausea & vomiting 07/2017   Pregnancy induced hypertension    Type 1 diabetes mellitus (HCC)    Insulin dependent    Family History  Problem Relation Age of Onset   Diabetes Mother    Kidney disease Father    Birth defects Son        unilateral renal agenesis    SOCIAL HISTORY: Social History   Tobacco Use   Smoking status: Former    Packs/day: 0.10    Years: 17.00    Additional pack years: 0.00    Total pack years: 1.70    Types: Cigarettes   Smokeless tobacco: Never   Tobacco comments:    06/02/2017 "quit ~ 6 months ago"  Substance Use Topics   Alcohol use: No    No Known Allergies  Current Outpatient Medications  Medication Sig Dispense Refill   Accu-Chek Softclix Lancets lancets Check blood sugar three times daily. Dx E11.65 100 each 12   atorvastatin (LIPITOR) 40 MG tablet Take 1 tablet (40 mg total) by mouth daily. 90 tablet 1   Blood Glucose Monitoring Suppl (ACCU-CHEK GUIDE) w/Device KIT Check blood  sugar three times daily. Dx E11.65 1 kit 0   cetirizine (ZYRTEC ALLERGY) 10 MG tablet Take 1 tablet (10 mg total) by mouth daily. 30 tablet 0   cilostazol (PLETAL) 50 MG tablet Take 1 tablet (50 mg total) by mouth 2 (two) times daily. 60 tablet 0   glucose blood (ACCU-CHEK GUIDE) test strip Check blood sugar three times daily. Dx E11.65 100 each 12   insulin aspart (NOVOLOG FLEXPEN) 100 UNIT/ML FlexPen Inject 0-12 Units into the skin 3 (three) times daily with meals. Per sliding scale 15 mL 11   Insulin Glargine Solostar (LANTUS) 100 UNIT/ML Solostar Pen Inject 78 Units into the skin daily. 30 mL 2   Insulin Pen Needle (B-D ULTRAFINE III SHORT  PEN) 31G X 8 MM MISC USE FOR INJECTIONS 2 TIMES DAILY AS DIRECTED. 100 each 11   Insulin Syringe-Needle U-100 (INSULIN SYRINGE 1CC/30GX1/2") 30G X 1/2" 1 ML MISC 1 Bag by Does not apply route 4 (four) times daily -  before meals and at bedtime. 100 each 6   lisinopril (ZESTRIL) 10 MG tablet Take 1 tablet (10 mg total) by mouth daily. 90 tablet 0   metFORMIN (GLUCOPHAGE-XR) 500 MG 24 hr tablet Take 1 tablet (500 mg total) by mouth daily after supper. 90 tablet 1   fluticasone (FLONASE) 50 MCG/ACT nasal spray Place 2 sprays into both nostrils daily for 14 days. 11.1 mL 0   No current facility-administered medications for this visit.    REVIEW OF SYSTEMS:  [X]  denotes positive finding, [ ]  denotes negative finding Cardiac  Comments:  Chest pain or chest pressure:    Shortness of breath upon exertion:    Short of breath when lying flat:    Irregular heart rhythm:        Vascular    Pain in calf, thigh, or hip brought on by ambulation: x   Pain in feet at night that wakes you up from your sleep:     Blood clot in your veins:    Leg swelling:         Pulmonary    Oxygen at home:    Productive cough:     Wheezing:         Neurologic    Sudden weakness in arms or legs:     Sudden numbness in arms or legs:     Sudden onset of difficulty speaking or slurred speech:    Temporary loss of vision in one eye:     Problems with dizziness:         Gastrointestinal    Blood in stool:     Vomited blood:         Genitourinary    Burning when urinating:     Blood in urine:        Psychiatric    Major depression:         Hematologic    Bleeding problems:    Problems with blood clotting too easily:        Skin    Rashes or ulcers:        Constitutional    Fever or chills:    -  PHYSICAL EXAM:   Vitals:   07/22/22 0923  BP: 136/82  Pulse: 96  Resp: 20  Temp: 98 F (36.7 C)  SpO2: 96%  Weight: 176 lb (79.8 kg)  Height: 5\' 2"  (1.575 m)   Body mass index is 32.19  kg/m. GENERAL: The patient is a well-nourished female, in no  acute distress. The vital signs are documented above. CARDIAC: There is a regular rate and rhythm.  VASCULAR: I do not detect carotid bruits. On the right side she has a palpable femoral pulse and a diminished but palpable dorsalis pedis pulse. On the left side she has a palpable femoral pulse.  I cannot palpate popliteal or pedal pulses. She has no significant lower extremity swelling. PULMONARY: There is good air exchange bilaterally without wheezing or rales. ABDOMEN: Soft and non-tender with normal pitched bowel sounds.  It is difficult to assess for an aneurysm because of her body habitus. MUSCULOSKELETAL: There are no major deformities. NEUROLOGIC: No focal weakness or paresthesias are detected. SKIN: There are no ulcers or rashes noted. PSYCHIATRIC: The patient has a normal affect.  DATA:    ARTERIAL DOPPLER STUDY: I have independently interpreted her arterial Doppler study today.   On the right side there is a biphasic posterior tibial and dorsalis pedis signal.  ABI is 89%.  Toe pressure 71 mmHg.  On the left side there is a biphasic posterior tibial and dorsalis pedis signal.  ABI is 59%.  Toe pressures 28 mmHg.  LABS: I have reviewed the labs from 07/12/2022.  Creatinine was 0.55.  Deitra Mayo [facility]

## 2022-07-27 ENCOUNTER — Ambulatory Visit: Payer: Medicaid Other | Attending: Family Medicine | Admitting: Pharmacist

## 2022-07-27 DIAGNOSIS — E1165 Type 2 diabetes mellitus with hyperglycemia: Secondary | ICD-10-CM

## 2022-07-27 DIAGNOSIS — Z794 Long term (current) use of insulin: Secondary | ICD-10-CM | POA: Diagnosis not present

## 2022-07-27 MED ORDER — NOVOLOG FLEXPEN 100 UNIT/ML ~~LOC~~ SOPN: PEN_INJECTOR | SUBCUTANEOUS | 11 refills | Status: DC

## 2022-07-27 MED ORDER — ROSUVASTATIN CALCIUM 10 MG PO TABS: 10.0000 mg | ORAL_TABLET | Freq: Every day | ORAL | 3 refills | Status: DC

## 2022-07-27 MED ORDER — FREESTYLE LIBRE 2 READER DEVI: 0 refills | Status: DC

## 2022-07-27 MED ORDER — INSULIN GLARGINE SOLOSTAR 100 UNIT/ML ~~LOC~~ SOPN: 78.0000 [IU] | PEN_INJECTOR | Freq: Every day | SUBCUTANEOUS | 2 refills | Status: DC

## 2022-07-27 MED ORDER — FREESTYLE LIBRE 2 SENSOR MISC: 3 refills | Status: DC

## 2022-07-27 NOTE — Progress Notes (Unsigned)
S:     No chief complaint on file.  Cheryl Parker is a 42 y.o. female who presents for diabetes evaluation, education, and management. PMH is significant for HTN, 123456 (complicated w/ hx of DKA). Patient was referred and last seen by Primary Care Provider, Charlott Rakes, on 07/12/2022.   Today, patient arrives in good spirits and presents without any assistance.   Patient reports Diabetes is longstanding. Since last visit, patient has stopped metformin due to side effects. She has only been using insulin. She reports she ran out of Lantus 07/19/2022 and was not able to refill Lantus or start Novolog due to an issue with the pharmacy.   Family/Social History:  -Fhx: DM, kidney disease  -Tobacco: former smoker  -Alcohol: none reported   Current diabetes medications include: Lantus 78 units daily, Novolog sliding scale  Insurance coverage: [place] Medicaid   Patient denies hypoglycemic events.  Reported home fasting blood sugars: 170s since seeing me.  No meter or CGM to verify this.   Patient denies nocturia (nighttime urination).  Patient denies neuropathy (nerve pain). Patient denies visual changes. Patient reports self foot exams.   Patient reported dietary habits:  -Denies any changes since I saw her last -Previously reported dietary habits are as follows:  Eats 2 meals/day Breakfast: [place] bacon, eggs, piece of bread, may have cereal Lunch: skips lately, usually eats leftovers otherwise Dinner: burger, hot dog, green beans, salad, usually eats at home Snacks: crackers, chips, apple, orange Drinks: water, sometimes coffee, stopped drinking sodas 1.5 years ago  Patient-reported exercise habits: on her feet daily with childcare, walks a few times a day   O:   ROS  Physical Exam  7 day average blood glucose: no meter with her today  No CGM with her today   Lab Results  Component Value Date   HGBA1C 10.7 (H) 06/01/2022   There were no vitals filed for this  visit.  Lipid Panel     Component Value Date/Time   CHOL 224 (H) 06/25/2021 1133   TRIG 69 06/25/2021 1133   HDL 49 06/25/2021 1133   CHOLHDL 4.6 (H) 06/25/2021 1133   CHOLHDL 9.2 12/27/2016 0343   VLDL 19 12/27/2016 0343   LDLCALC 163 (H) 06/25/2021 1133    Clinical Atherosclerotic Cardiovascular Disease (ASCVD): No  The 10-year ASCVD risk score (Arnett DK, et al., 2019) is: 8.1%   Values used to calculate the score:     Age: 64 years     Sex: Female     Is Non-Hispanic African American: Yes     Diabetic: Yes     Tobacco smoker: No     Systolic Blood Pressure: [id number] mmHg     Is BP treated: Yes     HDL Cholesterol: 49 mg/dL     Total Cholesterol: 224 mg/dL   Patient is participating in a Managed Medicaid Plan:  Yes   A/P: Diabetes longstanding currently uncontrolled, but we need an updated A1c. Reported sugars are better but will need to verify this before we make any changes. Patient is able to verbalize appropriate hypoglycemia management plan. Medication adherence appears appropriate but she cannot tolerate GLP-1 RA therapy. -Continue Lantus 78u daily. -Continue metformin 500 mg XR daily. *** -Patient educated on purpose, proper use, and potential adverse effects of Ozempic.  -Extensively discussed pathophysiology of diabetes, recommended lifestyle interventions, dietary effects on blood sugar control.  -Counseled on s/sx of and management of hypoglycemia.   Written patient instructions provided. Patient verbalized  understanding of treatment plan. Total time in face to face counseling 30 minutes.    Follow-up:  Pharmacist in in 1 month.  Benard Halsted, PharmD, [place], [place] [phone]

## 2022-07-28 ENCOUNTER — Other Ambulatory Visit: Payer: Self-pay

## 2022-07-28 ENCOUNTER — Encounter: Payer: Self-pay | Admitting: Pharmacist

## 2022-08-26 ENCOUNTER — Ambulatory Visit: Payer: Self-pay | Admitting: Pharmacist

## 2022-08-30 ENCOUNTER — Other Ambulatory Visit: Payer: Self-pay | Admitting: Family Medicine

## 2022-08-30 ENCOUNTER — Other Ambulatory Visit (INDEPENDENT_AMBULATORY_CARE_PROVIDER_SITE_OTHER): Payer: Self-pay | Admitting: Primary Care

## 2022-08-30 DIAGNOSIS — E782 Mixed hyperlipidemia: Secondary | ICD-10-CM

## 2022-08-30 DIAGNOSIS — Z794 Long term (current) use of insulin: Secondary | ICD-10-CM

## 2022-08-30 DIAGNOSIS — I10 Essential (primary) hypertension: Secondary | ICD-10-CM

## 2022-08-31 ENCOUNTER — Other Ambulatory Visit (INDEPENDENT_AMBULATORY_CARE_PROVIDER_SITE_OTHER): Payer: Self-pay | Admitting: Family Medicine

## 2022-08-31 NOTE — Telephone Encounter (Signed)
Requested Prescriptions  Pending Prescriptions Disp Refills   atorvastatin (LIPITOR) 20 MG tablet [Pharmacy Med Name: ATORVASTATIN 20MG  TABLETS] 90 tablet     Sig: TAKE 1 TABLET(20 MG) BY MOUTH DAILY     Cardiovascular:  Antilipid - Statins Failed - 08/30/2022 11:36 PM      Failed - Lipid Panel in normal range within the last 12 months    Cholesterol, Total  Date Value Ref Range Status  06/25/2021 224 (H) 100 - 199 mg/dL Final   LDL Chol Calc (NIH)  Date Value Ref Range Status  06/25/2021 163 (H) 0 - 99 mg/dL Final   HDL  Date Value Ref Range Status  06/25/2021 49 >39 mg/dL Final   Triglycerides  Date Value Ref Range Status  06/25/2021 69 0 - 149 mg/dL Final         Passed - Patient is not pregnant      Passed - Valid encounter within last 12 months    Recent Outpatient Visits           1 month ago Type 2 diabetes mellitus with hyperglycemia, with long-term current use of insulin (HCC)   [place] [facility] [place], Cornelius Moras, RPH-CPP   1 month ago Peripheral vascular disease (HCC)   [place] [place], Odette Horns, MD   3 months ago Type 2 diabetes mellitus with hyperglycemia, with long-term current use of insulin [facility])   [place] [facility] [place], Nokom[place] L, RPH-CPP   4 months ago COVID   [place] [facility] Grayce Sessions, NP   6 months ago Type 2 diabetes mellitus with hyperglycemia, with long-term current use of insulin (HCC)   [place] [facility] [place], Cornelius Moras, RPH-CPP       Future Appointments             In 1 week Lois Huxley, Cornelius Moras, RPH-CPP [place] [facility]   In 1 month [place], [place], MD [place] [facility]             lisinopril (ZESTRIL) 10 MG tablet [Pharmacy Med Name: LISINOPRIL 10MG  TABLETS] 90 tablet 0    Sig: TAKE 1 TABLET(10 MG)  BY MOUTH DAILY     Cardiovascular:  ACE Inhibitors Failed - 08/30/2022 11:36 PM      Failed - Cr in normal range and within 180 days    Creat  Date Value Ref Range Status  08/04/2015 0.46 (L) 0.50 - 1.10 mg/dL Final   Creatinine, Ser  Date Value Ref Range Status  07/12/2022 0.55 (L) 0.57 - 1.00 mg/dL Final   Creatinine, Urine  Date Value Ref Range Status  06/19/2015 113.00 mg/dL Final         Passed - K in normal range and within 180 days    Potassium  Date Value Ref Range Status  07/12/2022 4.2 3.5 - 5.2 mmol/L Final         Passed - Patient is not pregnant      Passed - Last BP in normal range    BP Readings from Last 1 Encounters:  07/22/22 136/82         Passed - Valid encounter within last 6 months    Recent Outpatient Visits           1 month ago Type 2 diabetes mellitus with hyperglycemia, with long-term current  use of insulin [facility])   [place] [facility] [place], Cornelius Moras, RPH-CPP   1 month ago Peripheral vascular disease [facility])   [place] [facility] [place], [place], MD   3 months ago Type 2 diabetes mellitus with hyperglycemia, with long-term current use of insulin [facility])   [place] [facility] [place], Cornelius Moras, RPH-CPP   4 months ago COVID   [place] [facility] Grayce Sessions, NP   6 months ago Type 2 diabetes mellitus with hyperglycemia, with long-term current use of insulin [facility])   [place] [facility] Drucilla [place], RPH-CPP       Future Appointments             In 1 week Lois Huxley, Cornelius Moras, RPH-CPP [place] [facility]   In 1 month [name], MD [facility] [place] [facility]

## 2022-09-09 ENCOUNTER — Ambulatory Visit: Payer: Medicaid Other | Attending: Family Medicine | Admitting: Pharmacist

## 2022-09-09 ENCOUNTER — Encounter: Payer: Self-pay | Admitting: Pharmacist

## 2022-09-09 ENCOUNTER — Other Ambulatory Visit: Payer: Self-pay

## 2022-09-09 DIAGNOSIS — Z794 Long term (current) use of insulin: Secondary | ICD-10-CM | POA: Diagnosis not present

## 2022-09-09 DIAGNOSIS — E1165 Type 2 diabetes mellitus with hyperglycemia: Secondary | ICD-10-CM

## 2022-09-09 MED ORDER — INSULIN LISPRO (1 UNIT DIAL) 100 UNIT/ML (KWIKPEN)
12.0000 [IU] | PEN_INJECTOR | Freq: Three times a day (TID) | SUBCUTANEOUS | 2 refills | Status: DC
  Filled 2022-09-09: qty 15, 42d supply, fill #0

## 2022-09-09 MED ORDER — FREESTYLE LIBRE 2 READER DEVI
0 refills | Status: DC
  Filled 2022-09-09: qty 1, 81d supply, fill #0
  Filled 2022-09-09: qty 1, 30d supply, fill #0

## 2022-09-09 NOTE — Progress Notes (Signed)
S:     No chief complaint on file.  Cheryl Parker is a 42 y.o. female who presents for diabetes evaluation, education, and management. PMH is significant for HTN, T2DM (complicated w/ hx of DKA). Patient was referred and last seen by Primary Care Provider, [name], on 07/12/2022. Pharmacy saw her 07/27/2022. We had to stop metformin d/t GI intolerance. Additionally, pt was not fully adherent to her SSI + basal insulin. We provided teaching and she was amenable to taking by the end of the visit.    Today, patient arrives in good spirits and presents without any assistance.   Patient reports Diabetes is longstanding. Since last visit, patient endorses compliance to her Lantus but stopped Novolog. She noticed pruritus with rash at the site of injection with 5 minutes of injecting. Developed hives on her trunk. She denies any airway restriction, wheezing. She said she took some Benadryl ~30 minutes later and symptoms resolved. No further occurrence of symptoms since stopping the Novolog. She has only been using Lantus. She brings in her [place] 2 sensors for placement.   Family/Social History:  -Fhx: DM, kidney disease  -Tobacco: former smoker  -Alcohol: none reported   Current diabetes medications include: Lantus 74 units daily, Novolog sliding scale (not taking)  Insurance coverage: [place] Medicaid   Patient denies hypoglycemic events.  Reported home fasting blood sugars: 220s - 300s. No meter or CGM to verify this.   Patient denies nocturia (nighttime urination).  Patient denies neuropathy (nerve pain). Patient denies visual changes. Patient reports self foot exams.   Patient reported dietary habits:  -Denies any changes since I saw her last -Previously reported dietary habits are as follows:  Eats 2 meals/day Breakfast: [place] bacon, eggs, piece of bread, may have cereal Lunch: skips lately, usually eats leftovers otherwise Dinner: burger, hot dog, green beans, salad, usually  eats at home Snacks: crackers, chips, apple, orange Drinks: water, sometimes coffee, stopped drinking sodas 1.5 years ago  Patient-reported exercise habits: on her feet daily with childcare, walks a few times a day   O:   ROS  Physical Exam  7 day average blood glucose: no meter with her today  CGM with her today but needs help placing it  Lab Results  Component Value Date   HGBA1C 10.7 (H) 06/01/2022   There were no vitals filed for this visit.  Lipid Panel     Component Value Date/Time   CHOL 224 (H) 06/25/2021 1133   TRIG 69 06/25/2021 1133   HDL 49 06/25/2021 1133   CHOLHDL 4.6 (H) 06/25/2021 1133   CHOLHDL 9.2 12/27/2016 0343   VLDL 19 12/27/2016 0343   LDLCALC 163 (H) 06/25/2021 1133    Clinical Atherosclerotic Cardiovascular Disease (ASCVD): No  The 10-year ASCVD risk score (Arnett DK, et al., 2019) is: 8.1%   Values used to calculate the score:     Age: 65 years     Sex: Female     Is Non-Hispanic African American: Yes     Diabetic: Yes     Tobacco smoker: No     Systolic Blood Pressure: 136 mmHg     Is BP treated: Yes     HDL Cholesterol: 49 mg/dL     Total Cholesterol: 224 mg/dL   Patient is participating in a Managed Medicaid Plan:  Yes   A/P: Diabetes longstanding currently uncontrolled. Reported sugars are above goal. Patient is able to verbalize appropriate hypoglycemia management plan. Medication adherence appears to be suboptimal. I  updated her chart to show an allergy to aspart analogues of insulin. We will send in Humalog instead. I also placed her Libre sensor, set-up her app on her phone, and scanned her first sensor. -Continue Lantus 78u daily. -Stop Novolog.  -Start SSI Humalog as prescribed.  -Patient educated on purpose, proper use, and potential adverse effects of SSI.  -Extensively discussed pathophysiology of diabetes, recommended lifestyle interventions, dietary effects on blood sugar control.  -Counseled on s/sx of and management of  hypoglycemia.  -Next A1c next month.  Written patient instructions provided. Patient verbalized understanding of treatment plan. Total time in face to face counseling 30 minutes.    Follow-up:  Pharmacist in in 1 month.  Butch Penny, PharmD, [place], CPP Clinical Pharmacist [facility] [phone]

## 2022-10-11 ENCOUNTER — Ambulatory Visit: Payer: Medicaid Other | Admitting: Pharmacist

## 2022-10-16 DIAGNOSIS — K0889 Other specified disorders of teeth and supporting structures: Secondary | ICD-10-CM | POA: Diagnosis not present

## 2022-10-16 DIAGNOSIS — K029 Dental caries, unspecified: Secondary | ICD-10-CM | POA: Diagnosis not present

## 2022-10-16 DIAGNOSIS — Z79899 Other long term (current) drug therapy: Secondary | ICD-10-CM | POA: Diagnosis not present

## 2022-10-18 ENCOUNTER — Ambulatory Visit: Payer: Medicaid Other | Admitting: Family Medicine

## 2022-10-27 ENCOUNTER — Ambulatory Visit: Payer: Medicaid Other | Admitting: Family Medicine

## 2022-11-01 ENCOUNTER — Encounter: Payer: Self-pay | Admitting: Pharmacist

## 2022-11-01 ENCOUNTER — Ambulatory Visit: Payer: Medicaid Other | Attending: Family Medicine | Admitting: Pharmacist

## 2022-11-01 DIAGNOSIS — E1165 Type 2 diabetes mellitus with hyperglycemia: Secondary | ICD-10-CM

## 2022-11-01 DIAGNOSIS — Z794 Long term (current) use of insulin: Secondary | ICD-10-CM

## 2022-11-01 LAB — POCT GLYCOSYLATED HEMOGLOBIN (HGB A1C): HbA1c, POC (controlled diabetic range): 9.8 % — AB (ref 0.0–7.0)

## 2022-11-01 MED ORDER — INSULIN LISPRO (1 UNIT DIAL) 100 UNIT/ML (KWIKPEN): 14.0000 [IU] | PEN_INJECTOR | Freq: Three times a day (TID) | SUBCUTANEOUS | 2 refills | Status: DC

## 2022-11-01 NOTE — Progress Notes (Signed)
S:     No chief complaint on file.  Cheryl Parker is a 42 y.o. female who presents for diabetes evaluation, education, and management. PMH is significant for HTN, T2DM (complicated w/ hx of DKA). Patient was referred and last seen by Primary Care Provider, [name], on 07/12/2022. Pharmacy saw pt last on 09/09/2022. I changed her bolus analog to lispro d/t an apparent allergic reaction (hives) to aspart.   Today, patient arrives in good spirits and presents without any assistance.   Patient reports Diabetes is longstanding. Since last visit, patient endorses compliance to her Lantus and Humalog. She tells me she most often uses 10-14u TID of her Humalog. No reaction reported to this bolus analog. A1c today shows some improvement. We are now <10% but still gradually improving. She does not have any concerns at this time.   Family/Social History:  -Fhx: DM, kidney disease  -Tobacco: former smoker  -Alcohol: none reported   Current diabetes medications include: Lantus 74 units daily, Humalog sliding scale (most often 10-14u TID before meals) Patient reports medication adherence.   Insurance coverage: [place] Medicaid   Patient denies hypoglycemic events.  Reported home fasting blood sugars:  -Tells me her home sugars are improving.  -Prior to changing to Humalog she endorsed 200s-300s.  -Today, she reports mostly high 100s (180s-190s) with occasional readings in the low 200s.  Patient denies nocturia (nighttime urination).  Patient denies neuropathy (nerve pain). Patient denies visual changes. Patient reports self foot exams.   Patient reported dietary habits:  -Denies any changes since I saw her last -Previously reported dietary habits are as follows:  Eats 2 meals/day Breakfast: [place] bacon, eggs, piece of bread, may have cereal Lunch: skips lately, usually eats leftovers otherwise Dinner: burger, hot dog, green beans, salad, usually eats at home Snacks: crackers, chips,  apple, orange Drinks: water, sometimes coffee, stopped drinking sodas 1.5 years ago  Patient-reported exercise habits: on her feet daily with childcare, walks a few times a day   O:   ROS  Physical Exam  7 day average blood glucose: no meter with her today  CGM is not in place.   Lab Results  Component Value Date   HGBA1C 9.8 (A) 11/01/2022   There were no vitals filed for this visit.  Lipid Panel     Component Value Date/Time   CHOL 224 (H) 06/25/2021 1133   TRIG 69 06/25/2021 1133   HDL 49 06/25/2021 1133   CHOLHDL 4.6 (H) 06/25/2021 1133   CHOLHDL 9.2 12/27/2016 0343   VLDL 19 12/27/2016 0343   LDLCALC 163 (H) 06/25/2021 1133    Clinical Atherosclerotic Cardiovascular Disease (ASCVD): No  The 10-year ASCVD risk score (Arnett DK, et al., 2019) is: 11.6%   Values used to calculate the score:     Age: 35 years     Sex: Female     Is Non-Hispanic African American: Yes     Diabetic: Yes     Tobacco smoker: No     Systolic Blood Pressure: 146 mmHg     Is BP treated: Yes     HDL Cholesterol: 49 mg/dL     Total Cholesterol: 224 mg/dL   Patient is participating in a Managed Medicaid Plan:  Yes   A/P: Diabetes longstanding currently uncontrolled but improving. Reported sugars are above goal but improved since being able to stay on  a RA analog. Patient is able to verbalize appropriate hypoglycemia management plan. Medication adherence appears to be optimal.  -  Continue Lantus 78u daily. -Continue Humalog but increase to 14u TID consistently.  -Patient educated on purpose, proper use, and potential adverse effects of Humalog.  -Extensively discussed pathophysiology of diabetes, recommended lifestyle interventions, dietary effects on blood sugar control.  -Counseled on s/sx of and management of hypoglycemia.  -Next A1c 01/2023.  Written patient instructions provided. Patient verbalized understanding of treatment plan. Total time in face to face counseling 30 minutes.     Follow-up:  Pharmacist in 1 month.  Butch Penny, PharmD, [place]y [place], CPP Clinical Pharmacist [facility] [phone]

## 2022-12-13 ENCOUNTER — Ambulatory Visit: Payer: Medicaid Other | Admitting: Pharmacist

## 2022-12-21 ENCOUNTER — Ambulatory Visit: Payer: Medicaid Other | Admitting: Pharmacist

## 2022-12-28 ENCOUNTER — Other Ambulatory Visit: Payer: Self-pay

## 2022-12-30 ENCOUNTER — Encounter: Payer: Self-pay | Admitting: Pharmacist

## 2022-12-30 ENCOUNTER — Ambulatory Visit: Payer: Medicaid Other | Attending: Family Medicine | Admitting: Pharmacist

## 2022-12-30 VITALS — BP 143/89

## 2022-12-30 DIAGNOSIS — E1165 Type 2 diabetes mellitus with hyperglycemia: Secondary | ICD-10-CM | POA: Diagnosis not present

## 2022-12-30 DIAGNOSIS — Z794 Long term (current) use of insulin: Secondary | ICD-10-CM

## 2022-12-30 DIAGNOSIS — I10 Essential (primary) hypertension: Secondary | ICD-10-CM

## 2022-12-30 MED ORDER — ROSUVASTATIN CALCIUM 10 MG PO TABS: ORAL_TABLET | ORAL | 3 refills | Status: DC

## 2022-12-30 MED ORDER — INSULIN GLARGINE SOLOSTAR 100 UNIT/ML ~~LOC~~ SOPN: 66.0000 [IU] | PEN_INJECTOR | Freq: Every day | SUBCUTANEOUS | 2 refills | Status: DC

## 2022-12-30 MED ORDER — INSULIN LISPRO (1 UNIT DIAL) 100 UNIT/ML (KWIKPEN): 10.0000 [IU] | PEN_INJECTOR | Freq: Three times a day (TID) | SUBCUTANEOUS | 2 refills | Status: DC

## 2022-12-30 MED ORDER — LISINOPRIL 10 MG PO TABS: 10.0000 mg | ORAL_TABLET | Freq: Every day | ORAL | 1 refills | Status: DC

## 2022-12-30 NOTE — Progress Notes (Signed)
S:    Cheryl Parker is a 42 y.o. female who presents for diabetes evaluation, education, and management. PMH is significant for HTN, T2DM (complicated w/ hx of DKA). Patient was referred and last seen by Primary Care Provider, [name], on 07/12/2022. Pharmacy saw pt last on 11/01/2022.  At last visit, Lantus 78 units continued and Humalog increased to 14 units TID.   Family/Social History:  -Fhx: DM, kidney disease  -Tobacco: former smoker  -Alcohol: none reported   Current diabetes medications include: Lantus 72 units daily and Humalog 6-8 units TID (per patient) Current hypertension medications include: lisinopril  Current hyperlipidemia medications include: rosuvastatin  Patient reports adherence to taking all medications as prescribed. Patient had some issues getting refills at [facility] recently. Prefers their [employer] over pharmacy downstairs due to transportation issues. Also reports taking Crestor "here and there" due to it causing constipation   Insurance coverage:  Medicaid  Patient denies hypoglycemic events.  Reported home blood sugars in 200-230s, improved from last visit.   Patient denies nocturia (nighttime urination) as much now.  Patient denies neuropathy (nerve pain). Patient denies visual changes. Patient denies self foot exams.   Patient reported dietary habits: Eats 1-2 meals/day (breakfast and lunch) Breakfast: [place] bacon, eggs, piece of bread, may have cereal Lunch: skips lately, usually eats leftovers otherwise Dinner: burger, hot dog, green beans, salad, usually eats at home Snacks: crackers, chips, apple, orange Drinks: Water, doesn't drink juices; soda rarely  - Sometimes eats fries. Now using air fryer to avoid using oil.   Patient-reported exercise habits:  - reports moving a lot by doing chores and taking care of kids   O:   Lab Results  Component Value Date   HGBA1C 9.8 (A) 11/01/2022   Vitals:   12/30/22 1640   BP: (!) 143/89   Lipid Panel     Component Value Date/Time   CHOL 224 (H) 06/25/2021 1133   TRIG 69 06/25/2021 1133   HDL 49 06/25/2021 1133   CHOLHDL 4.6 (H) 06/25/2021 1133   CHOLHDL 9.2 12/27/2016 0343   VLDL 19 12/27/2016 0343   LDLCALC 163 (H) 06/25/2021 1133    Clinical Atherosclerotic Cardiovascular Disease (ASCVD): No  The 10-year ASCVD risk score (Arnett DK, et al., 2019) is: 10.4%   Values used to calculate the score:     Age: 42 years     Sex: Female     Is Non-Hispanic African American: Yes     Diabetic: Yes     Tobacco smoker: No     Systolic Blood Pressure: 143 mmHg     Is BP treated: Yes     HDL Cholesterol: 49 mg/dL     Total Cholesterol: 224 mg/dL   A/P: Diabetes longstanding currently uncontrolled but improving. Reported sugars are above goal but improved since being able to stay on a RA analog. Patient is able to verbalize appropriate hypoglycemia management plan. Medication adherence appears to be optimal.  -Decrease Lantus to 66 units daily. -Increase Humalog to 10 units TID  -Continue to use Libre CGM - refills sent to pharmacy -Patient educated on purpose, proper use, and potential adverse effects of Humalog.  -Extensively discussed pathophysiology of diabetes, recommended lifestyle interventions, dietary effects on blood sugar control.  -Counseled on s/sx of and management of hypoglycemia.  -Next A1c 01/2023.  ASCVD risk - primary prevention in patient with diabetes. Last LDL is 163 (06/25/21) - not at goal of <70 mg/dL. ASCVD risk factors include HTN, DM,  and BMI, and 10-year ASCVD risk score of 11.6%. Moderate intensity statin indicated.  -Adjusted dose of Crestor 10 mg to three days a week d/t constipation reports from patient with taking daily  Hypertension longstanding and is 143/89 mmHg today. Blood pressure goal of <130/80  mmHg. Medication adherence okay.  -Continued lisinopril 10 mg daily. -Recommended patient to take at night since it makes  her sleepy  Written patient instructions provided. Patient verbalized understanding of treatment plan.  Total time in face to face counseling 30 minutes.    Follow-up:  Pharmacist in one month PCP clinic visit on 01/19/2023  Butch Penny, PharmD, BCACP, CPP Clinical Pharmacist [facility] & [facility] [phone]

## 2023-01-10 DIAGNOSIS — B351 Tinea unguium: Secondary | ICD-10-CM | POA: Diagnosis not present

## 2023-01-10 DIAGNOSIS — L209 Atopic dermatitis, unspecified: Secondary | ICD-10-CM | POA: Diagnosis not present

## 2023-01-12 ENCOUNTER — Encounter: Payer: Self-pay | Admitting: Pharmacist

## 2023-01-12 ENCOUNTER — Other Ambulatory Visit: Payer: Self-pay

## 2023-01-19 ENCOUNTER — Encounter: Payer: Self-pay | Admitting: Family Medicine

## 2023-01-19 ENCOUNTER — Ambulatory Visit: Payer: Medicaid Other | Attending: Family Medicine | Admitting: Family Medicine

## 2023-01-19 VITALS — BP 120/80 | HR 98 | Ht 62.0 in | Wt 177.0 lb

## 2023-01-19 DIAGNOSIS — B3731 Acute candidiasis of vulva and vagina: Secondary | ICD-10-CM | POA: Diagnosis not present

## 2023-01-19 DIAGNOSIS — Z794 Long term (current) use of insulin: Secondary | ICD-10-CM | POA: Diagnosis not present

## 2023-01-19 DIAGNOSIS — I739 Peripheral vascular disease, unspecified: Secondary | ICD-10-CM | POA: Diagnosis not present

## 2023-01-19 DIAGNOSIS — Z23 Encounter for immunization: Secondary | ICD-10-CM

## 2023-01-19 DIAGNOSIS — E1165 Type 2 diabetes mellitus with hyperglycemia: Secondary | ICD-10-CM

## 2023-01-19 DIAGNOSIS — E785 Hyperlipidemia, unspecified: Secondary | ICD-10-CM

## 2023-01-19 DIAGNOSIS — E1169 Type 2 diabetes mellitus with other specified complication: Secondary | ICD-10-CM

## 2023-01-19 MED ORDER — FLUCONAZOLE 150 MG PO TABS: 150.0000 mg | ORAL_TABLET | Freq: Once | ORAL | 0 refills | Status: AC

## 2023-01-19 MED ORDER — INSULIN GLARGINE SOLOSTAR 100 UNIT/ML ~~LOC~~ SOPN: 72.0000 [IU] | PEN_INJECTOR | Freq: Every day | SUBCUTANEOUS | Status: DC

## 2023-01-19 MED ORDER — INSULIN LISPRO (1 UNIT DIAL) 100 UNIT/ML (KWIKPEN): 6.0000 [IU] | PEN_INJECTOR | Freq: Three times a day (TID) | SUBCUTANEOUS | 2 refills | Status: DC

## 2023-01-19 NOTE — Patient Instructions (Signed)

## 2023-01-19 NOTE — Progress Notes (Signed)
Subjective:  Patient ID: Cheryl Parker, female    DOB: 1981-01-09  Age: 42 y.o. MRN: [id number]  CC: Medical Management of Chronic Issues (Yeast infection)   HPI Cheryl Parker is a 42 y.o. year old female with a history of type 2 diabetes mellitus (A1c 9.8), hypertension here to establish care.   Interval History: Discussed the use of AI scribe software for clinical note transcription with the patient, who gave verbal consent to proceed.  She presents for follow-up after a recent medication adjustment by the pharmacist. She reports that her blood glucose levels have improved from the 400s to the high 200s after adjusting her insulin regimen to Lantus 66 units at night and Humalog 6 units three times a day.  She states she was given this dose at her clinical pharmacy visit even though upon review of the notes her NovoLog was documented at 10 units 3 times daily.  Despite this improvement, her fasting blood glucose levels remain high, around 230-240. She has been monitoring her blood glucose levels by finger stick after her Freestyle Libre fell out and caused discomfort.  In addition to her diabetes, the patient reports a recurrent yeast infection, presenting with itching and discharge. She suspects this may be related to her elevated blood glucose levels.      She was seen by vascular in 06/2022 for peripheral artery disease with claudication and per notes she does have left leg claudication which is stable and no arteriography and revascularization would be considered unless she develops disabling claudication, rest pain or nonhealing ulcer. Also seen by dermatology and diagnosed with tinea pedis for which she was placed on terbinafine. Past Medical History:  Diagnosis Date   DKA (diabetic ketoacidoses) 09/10/2014; 06/02/2017   GERD (gastroesophageal reflux disease)    Gestational diabetes    Headache    Hypertension    Nausea & vomiting 07/2017   Pregnancy induced hypertension    Type  1 diabetes mellitus (HCC)    Insulin dependent    Past Surgical History:  Procedure Laterality Date   CESAREAN SECTION  2011   CESAREAN SECTION N/A 07/05/2012   Procedure: CESAREAN SECTION;  Surgeon: Catalina Antigua, MD;  Location: [facility];  Service: Obstetrics;  Laterality: N/A;   CESAREAN SECTION N/A 06/25/2015   Procedure: CESAREAN SECTION;  Surgeon: Catalina Antigua, MD;  Location: [facility];  Service: Obstetrics;  Laterality: N/A;   ESOPHAGOGASTRODUODENOSCOPY (EGD) WITH PROPOFOL Left 12/28/2016   Procedure: ESOPHAGOGASTRODUODENOSCOPY (EGD) WITH PROPOFOL;  Surgeon: Kerin Salen, MD;  Location: [facility] ENDOSCOPY;  Service: Gastroenterology;  Laterality: Left;    Family History  Problem Relation Age of Onset   Diabetes Mother    Kidney disease Father    Birth defects Son        unilateral renal agenesis    Social History   Socioeconomic History   Marital status: Single    Spouse name: Not on file   Number of children: Not on file   Years of education: Not on file   Highest education level: 11th grade  Occupational History   Not on file  Tobacco Use   Smoking status: Former    Current packs/day: 0.10    Average packs/day: 0.1 packs/day for 17.0 years (1.7 ttl pk-yrs)    Types: Cigarettes   Smokeless tobacco: Never   Tobacco comments:    06/02/2017 "quit ~ 6 months ago"  Vaping Use   Vaping status: Never Used  Substance and Sexual Activity   Alcohol use: No  Drug use: No   Sexual activity: Not on file  Other Topics Concern   Not on file  Social History Narrative   Not on file   Social Determinants of Health   Financial Resource Strain: Low Risk  (09/08/2022)   Overall Financial Resource Strain (CARDIA)    Difficulty of Paying Living Expenses: Not hard at all  Food Insecurity: No Food Insecurity (09/09/2022)   Hunger Vital Sign    Worried About Running Out of Food in the Last Year: Never true    Ran Out of Food in the Last Year: Never true  Transportation Needs: No  Transportation Needs (09/08/2022)   PRAPARE - Administrator, Civil Service (Medical): No    Lack of Transportation (Non-Medical): No  Physical Activity: Insufficiently Active (09/08/2022)   Exercise Vital Sign    Days of Exercise per Week: 2 days    Minutes of Exercise per Session: 60 min  Stress: No Stress Concern Present (09/09/2022)   [employer] of Occupational Health - Occupational Stress Questionnaire    Feeling of Stress : Not at all  Social Connections: Moderately Integrated (09/08/2022)   Social Connection and Isolation Panel [NHANES]    Frequency of Communication with Friends and Family: Three times a week    Frequency of Social Gatherings with Friends and Family: Twice a week    Attends Religious Services: 1 to 4 times per year    Active Member of [employer] or Organizations: No    Attends Engineer, structural: 1 to 4 times per year    Marital Status: Never married    Allergies  Allergen Reactions   Insulin Aspart Prot & Aspart Hives and Itching    Immediately after taking, developed itching and hives    Outpatient Medications Prior to Visit  Medication Sig Dispense Refill   Accu-Chek Softclix Lancets lancets Check blood sugar three times daily. Dx E11.65 100 each 12   Blood Glucose Monitoring Suppl (ACCU-CHEK GUIDE) w/Device KIT Check blood sugar three times daily. Dx E11.65 1 kit 0   Continuous Blood Gluc Sensor (FREESTYLE LIBRE 2 SENSOR) MISC Check blood sugar continuously. Change sensors once every 14 days. Dx: E11.65 2 each 3   Continuous Glucose Receiver (FREESTYLE LIBRE 2 READER) DEVI Check blood sugar continuously. Dx: E11.65 1 each 0   glucose blood (ACCU-CHEK GUIDE) test strip USE TO CHECK BLOOD SUGAR THREE TIMES DAILY AS DIRECTED 200 strip 1   Insulin Pen Needle (B-D ULTRAFINE III SHORT PEN) 31G X 8 MM MISC USE FOR INJECTIONS 2 TIMES DAILY AS DIRECTED. 100 each 11   Insulin Syringe-Needle U-100 (INSULIN SYRINGE 1CC/30GX1/2") 30G X 1/2" 1 ML  MISC 1 Bag by Does not apply route 4 (four) times daily -  before meals and at bedtime. 100 each 6   lisinopril (ZESTRIL) 10 MG tablet Take 1 tablet (10 mg total) by mouth daily. 90 tablet 1   rosuvastatin (CRESTOR) 10 MG tablet Take 1 tablet (10 mg total) by mouth on Monday, Wednesday, and Friday. 90 tablet 3   terbinafine (LAMISIL) 250 MG tablet Take 250 mg by mouth daily.     Insulin Glargine Solostar (LANTUS) 100 UNIT/ML Solostar Pen Inject 66 Units into the skin daily. 30 mL 2   insulin lispro (HUMALOG KWIKPEN) 100 UNIT/ML KwikPen Inject 10 Units into the skin 3 (three) times daily before meals. 15 mL 2   cetirizine (ZYRTEC ALLERGY) 10 MG tablet Take 1 tablet (10 mg total) by mouth daily. 30  tablet 0   cilostazol (PLETAL) 50 MG tablet Take 1 tablet (50 mg total) by mouth 2 (two) times daily. 60 tablet 0   fluticasone (FLONASE) 50 MCG/ACT nasal spray Place 2 sprays into both nostrils daily for 14 days. 11.1 mL 0   No facility-administered medications prior to visit.     ROS Review of Systems  Constitutional:  Negative for activity change and appetite change.  HENT:  Negative for sinus pressure and sore throat.   Respiratory:  Negative for chest tightness, shortness of breath and wheezing.   Cardiovascular:  Negative for chest pain and palpitations.  Gastrointestinal:  Negative for abdominal distention, abdominal pain and constipation.  Genitourinary: Negative.   Musculoskeletal: Negative.   Psychiatric/Behavioral:  Negative for behavioral problems and dysphoric mood.     Objective:  BP 120/80   Pulse 98   Ht 5\' 2"  (1.575 m)   Wt 177 lb (80.3 kg)   SpO2 97%   BMI 32.37 kg/m      01/19/2023    8:40 AM 12/30/2022    4:40 PM 07/22/2022    9:23 AM  BP/Weight  Systolic BP 120 143 136  Diastolic BP 80 89 82  Wt. (Lbs) 177  176  BMI 32.37 kg/m2  32.19 kg/m2      Physical Exam Constitutional:      Appearance: She is well-developed.  Cardiovascular:     Rate and Rhythm:  Normal rate.     Heart sounds: Normal heart sounds. No murmur heard. Pulmonary:     Effort: Pulmonary effort is normal.     Breath sounds: Normal breath sounds. No wheezing or rales.  Chest:     Chest wall: No tenderness.  Abdominal:     General: Bowel sounds are normal. There is no distension.     Palpations: Abdomen is soft. There is no mass.     Tenderness: There is no abdominal tenderness.  Musculoskeletal:        General: Normal range of motion.     Right lower leg: No edema.     Left lower leg: No edema.  Skin:    Comments: Erythematous rash around soles of both feet  Neurological:     Mental Status: She is alert and oriented to person, place, and time.  Psychiatric:        Mood and Affect: Mood normal.        Latest Ref Rng & Units 07/12/2022    3:01 PM 06/25/2021   11:33 AM 09/11/2020   10:30 AM  CMP  Glucose 70 - 99 mg/dL 595  638  756   BUN 6 - 24 mg/dL 7  8  9    Creatinine 0.57 - 1.00 mg/dL 4.33  2.95  1.88   Sodium 134 - 144 mmol/L 138  138  137   Potassium 3.5 - 5.2 mmol/L 4.2  4.3  4.5   Chloride 96 - 106 mmol/L 101  101  100   CO2 20 - 29 mmol/L 22  20  21    Calcium 8.7 - 10.2 mg/dL 9.5  9.8  9.3   Total Protein 6.0 - 8.5 g/dL  6.8  6.6   Total Bilirubin 0.0 - 1.2 mg/dL  0.2  0.3   Alkaline Phos 44 - 121 IU/L  185  126   AST 0 - 40 IU/L  32  44   ALT 0 - 32 IU/L  89  55     Lipid Panel     Component  Value Date/Time   CHOL 224 (H) 06/25/2021 1133   TRIG 69 06/25/2021 1133   HDL 49 06/25/2021 1133   CHOLHDL 4.6 (H) 06/25/2021 1133   CHOLHDL 9.2 12/27/2016 0343   VLDL 19 12/27/2016 0343   LDLCALC 163 (H) 06/25/2021 1133    CBC    Component Value Date/Time   WBC 9.8 06/25/2021 1133   WBC 8.4 11/11/2019 0621   RBC 4.52 06/25/2021 1133   RBC 3.70 (L) 11/11/2019 0621   HGB 14.1 06/25/2021 1133   HGB 12.8 01/10/2012 0000   HCT 43.1 06/25/2021 1133   PLT 387 06/25/2021 1133   PLT 372 01/10/2012 0000   MCV 95 06/25/2021 1133   MCH 31.2 06/25/2021  1133   MCH 31.4 11/11/2019 0621   MCHC 32.7 06/25/2021 1133   MCHC 32.7 11/11/2019 0621   RDW 11.7 06/25/2021 1133   LYMPHSABS 2.5 06/25/2021 1133   MONOABS 0.8 11/11/2019 0621   EOSABS 0.1 06/25/2021 1133   BASOSABS 0.0 06/25/2021 1133    Lab Results  Component Value Date   HGBA1C 9.8 (A) 11/01/2022    Assessment & Plan:      Type 2 Diabetes Mellitus Suboptimal control with fasting blood glucose in the 200s. Patient is currently on Lantus 66 units at night and Humalog 6 units TID. Discussed the need for increased insulin dosing due to persistent hyperglycemia. -Increase Lantus to 72 units at night. -Continue Humalog 6 units TID. -Check blood glucose levels regularly and maintain a log. -Follow-up with pharmacist on 02/03/2023 for further adjustment of insulin regimen.  Hypertension associated with type 2 diabetes mellitus Controlled -Continue current antihypertensive -Counseled on blood pressure goal of less than 130/80, low-sodium, DASH diet, medication compliance, 150 minutes of moderate intensity exercise per week. Discussed medication compliance, adverse effects.  Peripheral vascular disease Asymptomatic Seen by vascular in 06/2022 -Continue risk factor modification per vascular note -Continue statin and exercise regimen  Hyperlipidemia associated with type 2 diabetes mellitus -Uncontrolled -Repeat lipid panel -Continue statin and will adjust dose after lipid panel is obtained -Low-cholesterol diet  Vaginal candidiasis Likely secondary to poorly controlled diabetes. -Prescribe Diflucan for yeast infection.  General Health Maintenance -Order blood tests today. -Refer for annual diabetic eye exam. -Follow-up in three months.          Meds ordered this encounter  Medications   fluconazole (DIFLUCAN) 150 MG tablet    Sig: Take 1 tablet (150 mg total) by mouth once for 1 dose.    Dispense:  1 tablet    Refill:  0   Insulin Glargine Solostar (LANTUS) 100  UNIT/ML Solostar Pen    Sig: Inject 72 Units into the skin daily.    Dose increase   insulin lispro (HUMALOG KWIKPEN) 100 UNIT/ML KwikPen    Sig: Inject 6 Units into the skin 3 (three) times daily before meals.    Dispense:  15 mL    Refill:  2    Follow-up: Return in about 3 months (around 04/20/2023) for Chronic medical conditions.       [name], MD, FAAFP. [facility] [place], [place] [phone]   01/19/2023, 10:21 AM

## 2023-01-20 ENCOUNTER — Other Ambulatory Visit: Payer: Self-pay | Admitting: Family Medicine

## 2023-01-20 DIAGNOSIS — E1165 Type 2 diabetes mellitus with hyperglycemia: Secondary | ICD-10-CM

## 2023-01-20 LAB — CBC WITH DIFFERENTIAL/PLATELET
Basophils Absolute: 0 10*3/uL (ref 0.0–0.2)
Basos: 0 %
EOS (ABSOLUTE): 0 10*3/uL (ref 0.0–0.4)
Eos: 0 %
Hematocrit: 44.3 % (ref 34.0–46.6)
Hemoglobin: 14.5 g/dL (ref 11.1–15.9)
Immature Grans (Abs): 0.1 10*3/uL (ref 0.0–0.1)
Immature Granulocytes: 1 %
Lymphocytes Absolute: 2.6 10*3/uL (ref 0.7–3.1)
Lymphs: 23 %
MCH: 32.3 pg (ref 26.6–33.0)
MCHC: 32.7 g/dL (ref 31.5–35.7)
MCV: 99 fL — ABNORMAL HIGH (ref 79–97)
Monocytes Absolute: 0.6 10*3/uL (ref 0.1–0.9)
Monocytes: 5 %
Neutrophils Absolute: 8 10*3/uL — ABNORMAL HIGH (ref 1.4–7.0)
Neutrophils: 71 %
Platelets: 298 10*3/uL (ref 150–450)
RBC: 4.49 x10E6/uL (ref 3.77–5.28)
RDW: 11.8 % (ref 11.7–15.4)
WBC: 11.4 10*3/uL — ABNORMAL HIGH (ref 3.4–10.8)

## 2023-01-20 LAB — CMP14+EGFR
ALT: 28 IU/L (ref 0–32)
AST: 33 IU/L (ref 0–40)
Albumin: 4.4 g/dL (ref 3.9–4.9)
Alkaline Phosphatase: 129 IU/L — ABNORMAL HIGH (ref 44–121)
BUN/Creatinine Ratio: 15 (ref 9–23)
BUN: 9 mg/dL (ref 6–24)
Bilirubin Total: <0.2 mg/dL (ref 0.0–1.2)
CO2: 23 mmol/L (ref 20–29)
Calcium: 9.7 mg/dL (ref 8.7–10.2)
Chloride: 96 mmol/L (ref 96–106)
Creatinine, Ser: 0.6 mg/dL (ref 0.57–1.00)
Globulin, Total: 2 g/dL (ref 1.5–4.5)
Glucose: 206 mg/dL — ABNORMAL HIGH (ref 70–99)
Potassium: 4.2 mmol/L (ref 3.5–5.2)
Sodium: 135 mmol/L (ref 134–144)
Total Protein: 6.4 g/dL (ref 6.0–8.5)
eGFR: 115 mL/min/{1.73_m2} (ref >59)

## 2023-01-20 LAB — LP+NON-HDL CHOLESTEROL
Cholesterol, Total: 304 mg/dL — ABNORMAL HIGH (ref 100–199)
HDL: 59 mg/dL (ref >39)
LDL Chol Calc (NIH): 228 mg/dL — ABNORMAL HIGH (ref 0–99)
Total Non-HDL-Chol (LDL+VLDL): 245 mg/dL — ABNORMAL HIGH (ref 0–129)
Triglycerides: 98 mg/dL (ref 0–149)
VLDL Cholesterol Cal: 17 mg/dL (ref 5–40)

## 2023-01-20 MED ORDER — ROSUVASTATIN CALCIUM 20 MG PO TABS: 20.0000 mg | ORAL_TABLET | Freq: Every day | ORAL | 1 refills | Status: DC

## 2023-02-03 ENCOUNTER — Ambulatory Visit: Payer: Medicaid Other | Admitting: Pharmacist

## 2023-02-15 ENCOUNTER — Encounter: Payer: Self-pay | Admitting: Pharmacist

## 2023-02-15 ENCOUNTER — Ambulatory Visit: Payer: Medicaid Other | Attending: Family Medicine | Admitting: Pharmacist

## 2023-02-15 DIAGNOSIS — E1165 Type 2 diabetes mellitus with hyperglycemia: Secondary | ICD-10-CM | POA: Diagnosis not present

## 2023-02-15 DIAGNOSIS — Z794 Long term (current) use of insulin: Secondary | ICD-10-CM

## 2023-02-15 MED ORDER — INSULIN LISPRO (1 UNIT DIAL) 100 UNIT/ML (KWIKPEN): 14.0000 [IU] | PEN_INJECTOR | Freq: Three times a day (TID) | SUBCUTANEOUS | 2 refills | Status: DC

## 2023-02-15 NOTE — Progress Notes (Signed)
S:     No chief complaint on file.  Cheryl Parker is a 42 y.o. female who presents for diabetes evaluation, education, and management. PMH is significant for HTN, T2DM (complicated w/ hx of DKA). Patient was referred and last seen by Primary Care Provider, Dr. Alvis Lemmings, on 01/19/2023. Pharmacy saw pt last on 12/30/2022.  Today, patient arrives in good spirits and presents without any assistance.   Patient reports Diabetes is longstanding. Since last visit, patient endorses compliance to her Lantus and Humalog. She is using 10u TID of Humalog + 66u daily of Lantus. No concerns today.   Family/Social History:  -Fhx: DM, kidney disease  -Tobacco: former smoker  -Alcohol: none reported   Current diabetes medications include: Lantus 66 units daily, Humalog 10u TID.  Patient reports medication adherence.   Insurance coverage: [place] Medicaid   Patient denies hypoglycemic events.  Reported home fasting blood sugars:  -Tells me her home sugars are improving.  -180s - 200s.   Patient denies nocturia (nighttime urination).  Patient denies neuropathy (nerve pain). Patient denies visual changes. Patient reports self foot exams.   Patient reported dietary habits:  -Denies any changes since I saw her last -Previously reported dietary habits are as follows:  Eats 2 meals/day Breakfast: [place] bacon, eggs, piece of bread, may have cereal Lunch: skips lately, usually eats leftovers otherwise Dinner: burger, hot dog, green beans, salad, usually eats at home Snacks: crackers, chips, apple, orange Drinks: water, sometimes coffee, stopped drinking sodas 1.5 years ago  Patient-reported exercise habits: on her feet daily with childcare, walks a few times a day   O:   ROS  Physical Exam  7 day average blood glucose: no meter with her today  CGM is not in place.   Lab Results  Component Value Date   HGBA1C 9.8 (A) 11/01/2022   There were no vitals filed for this visit.  Lipid Panel      Component Value Date/Time   CHOL 304 (H) 01/19/2023 0920   TRIG 98 01/19/2023 0920   HDL 59 01/19/2023 0920   CHOLHDL 4.6 (H) 06/25/2021 1133   CHOLHDL 9.2 12/27/2016 0343   VLDL 19 12/27/2016 0343   LDLCALC 228 (H) 01/19/2023 0920    Clinical Atherosclerotic Cardiovascular Disease (ASCVD): No  The 10-year ASCVD risk score (Arnett DK, et al., 2019) is: 3.8%   Values used to calculate the score:     Age: 47 years     Sex: Female     Is Non-Hispanic African American: Yes     Diabetic: Yes     Tobacco smoker: No     Systolic Blood Pressure: 120 mmHg     Is BP treated: Yes     HDL Cholesterol: 59 mg/dL     Total Cholesterol: 304 mg/dL   Patient is participating in a Managed Medicaid Plan:  Yes   A/P: Diabetes longstanding currently uncontrolled. Reported sugars are above goal but improved since being able to stay on a RA analog. Patient is able to verbalize appropriate hypoglycemia management plan. Medication adherence appears to be optimal. She is overbasalized at this point. I think her mealtime requirement is larger. We will begin titrating bolus insulin and, hopefully, we'll be able to decrease basal insulin.  -Continue Lantus 66u daily for now.  -Increase Humalog to 14u TID before meals.  -Patient educated on purpose, proper use, and potential adverse effects of Humalog.  -Extensively discussed pathophysiology of diabetes, recommended lifestyle interventions, dietary effects on blood sugar  control.  -Counseled on s/sx of and management of hypoglycemia.  -A1c today.  Written patient instructions provided. Patient verbalized understanding of treatment plan. Total time in face to face counseling 30 minutes.    Follow-up:  Pharmacist in 1 month.  Butch Penny, PharmD, Patsy [place], CPP Clinical Pharmacist [facility] & [facility] [phone]

## 2023-02-16 LAB — HEMOGLOBIN A1C
Est. average glucose Bld gHb Est-mCnc: 280 mg/dL
Hgb A1c MFr Bld: 11.4 % — ABNORMAL HIGH (ref 4.8–5.6)

## 2023-03-15 ENCOUNTER — Ambulatory Visit: Payer: Medicaid Other | Attending: Family Medicine | Admitting: Pharmacist

## 2023-03-15 ENCOUNTER — Encounter: Payer: Self-pay | Admitting: Pharmacist

## 2023-03-15 DIAGNOSIS — E1165 Type 2 diabetes mellitus with hyperglycemia: Secondary | ICD-10-CM

## 2023-03-15 DIAGNOSIS — Z794 Long term (current) use of insulin: Secondary | ICD-10-CM

## 2023-03-15 MED ORDER — INSULIN GLARGINE SOLOSTAR 100 UNIT/ML ~~LOC~~ SOPN: 68.0000 [IU] | PEN_INJECTOR | Freq: Every day | SUBCUTANEOUS | 2 refills | Status: DC

## 2023-03-15 MED ORDER — INSULIN LISPRO (1 UNIT DIAL) 100 UNIT/ML (KWIKPEN): 14.0000 [IU] | PEN_INJECTOR | Freq: Three times a day (TID) | SUBCUTANEOUS | 2 refills | Status: DC

## 2023-03-15 NOTE — Progress Notes (Signed)
S:     No chief complaint on file.  Cheryl Parker is a 42 y.o. female who presents for diabetes evaluation, education, and management. PMH is significant for HTN, T2DM (complicated w/ hx of DKA). Patient was referred and last seen by Primary Care Provider, Dr. Alvis Lemmings, on 01/19/2023. Pharmacy saw pt last on 02/15/2023. We increased Humalog to 14u TID with meals.   Today, patient arrives in good spirits and presents without any assistance.   Patient reports Diabetes is longstanding. Since last visit, patient endorses compliance to her Lantus and Humalog. She is using 10u TID of Humalog + 76u daily of Lantus. She tells me she took 14u TID of Humalog for a couple of days after her last visit but decreased back to 10u d/t symptoms of hypo. She denies objective hypoglycemia. She tells me her glucose at home was in the 160s when she felt symptoms.  Family/Social History:  -Fhx: DM, kidney disease  -Tobacco: former smoker  -Alcohol: none reported   Current diabetes medications include: Lantus 72 units daily (taking 76 instead), Humalog 14u TID (10u TID).  Patient reports medication adherence but takes differently than prescribed.   Insurance coverage: [place] Medicaid   Patient denies hypoglycemic events.  Reported home fasting blood sugars:  -Tells me her home sugars are improving.  -180s - 200s.   Patient denies nocturia (nighttime urination).  Patient denies neuropathy (nerve pain). Patient denies visual changes. Patient reports self foot exams.   Patient reported dietary habits:  -Denies any changes since I saw her last -Previously reported dietary habits are as follows:  Eats 2 meals/day Breakfast: [place] bacon, eggs, piece of bread, may have cereal Lunch: skips lately, usually eats leftovers otherwise Dinner: burger, hot dog, green beans, salad, usually eats at home Snacks: crackers, chips, apple, orange Drinks: water, sometimes coffee, stopped drinking sodas 1.5 years  ago  Patient-reported exercise habits: on her feet daily with childcare, walks a few times a day   O:   ROS  Physical Exam  7 day average blood glucose: no meter with her today  CGM is not in place.   Lab Results  Component Value Date   HGBA1C 11.4 (H) 02/15/2023   There were no vitals filed for this visit.  Lipid Panel     Component Value Date/Time   CHOL 304 (H) 01/19/2023 0920   TRIG 98 01/19/2023 0920   HDL 59 01/19/2023 0920   CHOLHDL 4.6 (H) 06/25/2021 1133   CHOLHDL 9.2 12/27/2016 0343   VLDL 19 12/27/2016 0343   LDLCALC 228 (H) 01/19/2023 0920    Clinical Atherosclerotic Cardiovascular Disease (ASCVD): No  The 10-year ASCVD risk score (Arnett DK, et al., 2019) is: 3.8%   Values used to calculate the score:     Age: 27 years     Sex: Female     Is Non-Hispanic African American: Yes     Diabetic: Yes     Tobacco smoker: No     Systolic Blood Pressure: 120 mmHg     Is BP treated: Yes     HDL Cholesterol: 59 mg/dL     Total Cholesterol: 304 mg/dL   Patient is participating in a Managed Medicaid Plan:  Yes   A/P: Diabetes longstanding currently uncontrolled. Reported sugars are above goal. Patient is able to verbalize appropriate hypoglycemia management plan. Her symptoms are due to relative hypoglycemia and I explained this to her. Again, I think she is over-basalized and we will work to decrease basal  but increase her mealtime insulin. -Decrease Lantus 68u daily for now.  -Increase Humalog to 14u TID before meals.  -Patient educated on purpose, proper use, and potential adverse effects of Humalog.  -Extensively discussed pathophysiology of diabetes, recommended lifestyle interventions, dietary effects on blood sugar control.  -Counseled on s/sx of and management of hypoglycemia.  -A1c 04/2023.  Written patient instructions provided. Patient verbalized understanding of treatment plan. Total time in face to face counseling 30 minutes.    Follow-up:   Pharmacist in 1 month.  Butch Penny, PharmD, Patsy [place], CPP Clinical Pharmacist [facility] & [facility] [place] [phone]

## 2023-04-26 ENCOUNTER — Ambulatory Visit: Payer: Medicaid Other | Admitting: Family Medicine

## 2023-05-03 ENCOUNTER — Ambulatory Visit: Payer: Medicaid Other | Admitting: Pharmacist

## 2023-05-09 DIAGNOSIS — R059 Cough, unspecified: Secondary | ICD-10-CM | POA: Diagnosis not present

## 2023-05-09 DIAGNOSIS — Z20822 Contact with and (suspected) exposure to covid-19: Secondary | ICD-10-CM | POA: Diagnosis not present

## 2023-05-09 DIAGNOSIS — J069 Acute upper respiratory infection, unspecified: Secondary | ICD-10-CM | POA: Diagnosis not present

## 2023-05-09 DIAGNOSIS — B9789 Other viral agents as the cause of diseases classified elsewhere: Secondary | ICD-10-CM | POA: Diagnosis not present

## 2023-06-03 ENCOUNTER — Ambulatory Visit: Payer: Medicaid Other | Admitting: Pharmacist

## 2023-06-07 ENCOUNTER — Ambulatory Visit: Payer: Medicaid Other | Attending: Family Medicine | Admitting: Pharmacist

## 2023-06-07 ENCOUNTER — Encounter: Payer: Self-pay | Admitting: Pharmacist

## 2023-06-07 DIAGNOSIS — I10 Essential (primary) hypertension: Secondary | ICD-10-CM

## 2023-06-07 DIAGNOSIS — Z794 Long term (current) use of insulin: Secondary | ICD-10-CM

## 2023-06-07 DIAGNOSIS — E1165 Type 2 diabetes mellitus with hyperglycemia: Secondary | ICD-10-CM

## 2023-06-07 LAB — POCT GLYCOSYLATED HEMOGLOBIN (HGB A1C): HbA1c, POC (controlled diabetic range): 10.8 % — AB (ref 0.0–7.0)

## 2023-06-07 MED ORDER — INSULIN LISPRO (1 UNIT DIAL) 100 UNIT/ML (KWIKPEN): 14.0000 [IU] | PEN_INJECTOR | Freq: Three times a day (TID) | SUBCUTANEOUS | 1 refills | Status: DC

## 2023-06-07 MED ORDER — INSULIN GLARGINE SOLOSTAR 100 UNIT/ML ~~LOC~~ SOPN: 60.0000 [IU] | PEN_INJECTOR | Freq: Every day | SUBCUTANEOUS | 1 refills | Status: DC

## 2023-06-07 MED ORDER — LISINOPRIL 10 MG PO TABS: 10.0000 mg | ORAL_TABLET | Freq: Every day | ORAL | 1 refills | Status: DC

## 2023-06-07 MED ORDER — INSULIN GLARGINE SOLOSTAR 100 UNIT/ML ~~LOC~~ SOPN: 68.0000 [IU] | PEN_INJECTOR | Freq: Every day | SUBCUTANEOUS | 1 refills | Status: DC

## 2023-06-07 MED ORDER — BD PEN NEEDLE SHORT U/F 31G X 8 MM MISC: 1 refills | Status: DC

## 2023-06-07 MED ORDER — ACCU-CHEK GUIDE TEST VI STRP: ORAL_STRIP | 6 refills | Status: DC

## 2023-06-07 MED ORDER — ACCU-CHEK GUIDE W/DEVICE KIT: PACK | 0 refills | Status: DC

## 2023-06-07 MED ORDER — INSULIN LISPRO (1 UNIT DIAL) 100 UNIT/ML (KWIKPEN): 20.0000 [IU] | PEN_INJECTOR | Freq: Three times a day (TID) | SUBCUTANEOUS | 1 refills | Status: DC

## 2023-06-07 MED ORDER — ROSUVASTATIN CALCIUM 20 MG PO TABS: 20.0000 mg | ORAL_TABLET | Freq: Every day | ORAL | 1 refills | Status: DC

## 2023-06-07 MED ORDER — ACCU-CHEK SOFTCLIX LANCETS MISC: 6 refills | Status: DC

## 2023-06-07 NOTE — Progress Notes (Signed)
S:     No chief complaint on file.  Cheryl Parker is a 43 y.o. female who presents for diabetes evaluation, education, and management. PMH is significant for HTN, T2DM (complicated w/ hx of DKA). Patient was referred and last seen by Primary Care Provider, Dr. Alvis Lemmings, on 01/19/2023. Pharmacy saw pt last on 03/15/2023. We decreased her basal insulin and increased her bolus insulin.   Today, patient arrives in good spirits and presents without any assistance.   Patient reports Diabetes is longstanding. Since last visit, patient endorses compliance to her Lantus and Humalog. She is using 14u TID of Humalog + 60u daily of Lantus. She denies objective hypoglycemia. She tells me her glucose at home has been in the 200s-300s.  Family/Social History:  -Fhx: DM, kidney disease  -Tobacco: former smoker  -Alcohol: none reported   Current diabetes medications include: Lantus 60 units daily, Humalog 14u TID Patient reports medication adherence but takes differently than prescribed.  Insurance coverage: [place] Medicaid   Patient denies hypoglycemic events.  Patient denies nocturia (nighttime urination).  Patient denies neuropathy (nerve pain). Patient denies visual changes. Patient reports self foot exams.   Patient reported dietary habits:  -Denies any changes since I saw her last -Previously reported dietary habits are as follows:  Eats 2 meals/day Breakfast: [place] bacon, eggs, piece of bread, may have cereal Lunch: skips lately, usually eats leftovers otherwise Dinner: burger, hot dog, green beans, salad, usually eats at home Snacks: crackers, chips, apple, orange Drinks: water, sometimes coffee, stopped drinking sodas 1.5 years ago  Patient-reported exercise habits: on her feet daily with childcare, walks a few times a day  O:   ROS  Physical Exam  7 day average blood glucose: no meter with her today  CGM is not in place.   Lab Results  Component Value Date   HGBA1C 11.4 (H)  02/15/2023   There were no vitals filed for this visit.  Lipid Panel     Component Value Date/Time   CHOL 304 (H) 01/19/2023 0920   TRIG 98 01/19/2023 0920   HDL 59 01/19/2023 0920   CHOLHDL 4.6 (H) 06/25/2021 1133   CHOLHDL 9.2 12/27/2016 0343   VLDL 19 12/27/2016 0343   LDLCALC 228 (H) 01/19/2023 0920    Clinical Atherosclerotic Cardiovascular Disease (ASCVD): No  The 10-year ASCVD risk score (Arnett DK, et al., 2019) is: 16.6%   Values used to calculate the score:     Age: 31 years     Sex: Female     Is Non-Hispanic African American: Yes     Diabetic: Yes     Tobacco smoker: No     Systolic Blood Pressure: 159 mmHg     Is BP treated: Yes     HDL Cholesterol: 59 mg/dL     Total Cholesterol: 304 mg/dL   Patient is participating in a Managed Medicaid Plan:  Yes   A/P: Diabetes longstanding currently uncontrolled. A1c today is improving from October, however, reported sugars are above goal. Patient is able to verbalize appropriate hypoglycemia management plan. She is not symptomatic at this time.  -Continue Lantus 60u daily for now.  -Increase Humalog to 20u TID before meals.  -Patient educated on purpose, proper use, and potential adverse effects of Humalog.  -Extensively discussed pathophysiology of diabetes, recommended lifestyle interventions, dietary effects on blood sugar control.  -Counseled on s/sx of and management of hypoglycemia.  -A1c 08/2023.  Written patient instructions provided. Patient verbalized understanding of treatment plan. Total  time in face to face counseling 30 minutes.    Follow-up:  Pharmacist in 2 months. Dr. Alvis Lemmings on 07/21/2023.  Butch Penny, PharmD, [place]y [place], CPP Clinical Pharmacist [facility] [phone]

## 2023-07-12 DIAGNOSIS — H5213 Myopia, bilateral: Secondary | ICD-10-CM | POA: Diagnosis not present

## 2023-07-21 ENCOUNTER — Ambulatory Visit: Payer: Medicaid Other | Admitting: Family Medicine

## 2023-08-08 ENCOUNTER — Ambulatory Visit: Payer: Medicaid Other | Admitting: Pharmacist

## 2023-08-30 ENCOUNTER — Ambulatory Visit: Admitting: Pharmacist

## 2023-08-31 NOTE — Progress Notes (Unsigned)
 S:     No chief complaint on file.  Cheryl Parker is a 43 y.o. female with PMH significant for HTN, T2DM (complicated w/ hx of DKA). Last visit with pharmacist 06/07/2023 and identified patient is taking their insulin  regimen different than prescribed. A1c at that visit was 10.8%. Increased Humalog  from 14 U to 20 U TID and continued Lantus  60 U daily. In the past, noted that patient is overbasalized and medication management was to decrease basal insulin  and increase mealtime insulin .   Today, patient presents for diabetes evaluation, education, and management.    ***adherence to Lantus  and Humalog . She is using 20 U TID of Humalog  + 60u daily of Lantus .   BG readings at home:  Any readings over 200 mg/dL:   Current diabetes medications include: Lantus  60 units daily, Humalog  20 units TID.   Insurance coverage: [place] Medicaid   ***hypoglycemic events (sweating, lightheadedness, slurred speech)  Hyperglycemic symptoms:  ***nocturia (nighttime urination).  *** neuropathy (nerve pain). *** visual changes. ***self foot exams.   Family/Social History:  -Fhx: DM, kidney disease  -Tobacco: former smoker  -Alcohol: none reported   Patient reported dietary habits:  Eats *** meals/day Breakfast: *** [place] bacon, eggs, piece of bread, may have cereal Lunch: *** skips lately, usually eats leftovers otherwise Dinner: ***  burger, hot dog, green beans, salad, usually eats at home Snacks: *** crackers, chips, apple, orange Drinks: *** water, sometimes coffee, stopped drinking sodas 1.5 years ago  Patient-reported exercise habits: on her feet daily with childcare, walks a few times a day  O:   ROS  Physical Exam  Lab Results  Component Value Date   HGBA1C 10.8 (A) 06/07/2023   There were no vitals filed for this visit.  Lipid Panel     Component Value Date/Time   CHOL 304 (H) 01/19/2023 0920   TRIG 98 01/19/2023 0920   HDL 59 01/19/2023 0920   CHOLHDL 4.6 (H)  06/25/2021 1133   CHOLHDL 9.2 12/27/2016 0343   VLDL 19 12/27/2016 0343   LDLCALC 228 (H) 01/19/2023 0920    Clinical Atherosclerotic Cardiovascular Disease (ASCVD): No  The 10-year ASCVD risk score (Arnett DK, et al., 2019) is: 16.6%   Values used to calculate the score:     Age: 14 years     Sex: Female     Is Non-Hispanic African American: Yes     Diabetic: Yes     Tobacco smoker: No     Systolic Blood Pressure: 159 mmHg     Is BP treated: Yes     HDL Cholesterol: 59 mg/dL     Total Cholesterol: 304 mg/dL   Patient is participating in a Managed Medicaid Plan:  Yes   A/P: Diabetes longstanding currently uncontrolled. A1c today is *** since February. BS are ***. Patient is able to verbalize appropriate hypoglycemia management plan. She is *** symptomatic at this time. She is overbasalized and prandial insulin  is 50/50 split with current basal dose. Consider adding agent (SGLT2i) for better glycemic control.  -Continue Lantus  60 U daily -continue Humalog  20 U TID  - collect A1c today *** - collect BMP today to evaluate kidney function to potentially consider initiating SGLT2i.   -Patient educated on purpose, proper use, and potential adverse effects of Humalog .  -Extensively discussed pathophysiology of diabetes, recommended lifestyle interventions, dietary effects on blood sugar control.  -Counseled on s/sx of and management of hypoglycemia.  -A1c 11/2023.   Written patient instructions provided. Patient verbalized understanding  of treatment plan. Total time in face to face counseling 30 minutes.    Follow-up:  Pharmacist in 2 months  Georges Kings  PharmD Candidate Class of 2026 [employer]   ***

## 2023-09-01 ENCOUNTER — Encounter: Payer: Self-pay | Admitting: Family Medicine

## 2023-09-01 ENCOUNTER — Ambulatory Visit: Attending: Family Medicine | Admitting: Family Medicine

## 2023-09-01 ENCOUNTER — Ambulatory Visit: Admitting: Pharmacist

## 2023-09-01 VITALS — BP 146/89 | HR 101 | Ht 62.0 in | Wt 167.6 lb

## 2023-09-01 DIAGNOSIS — E1165 Type 2 diabetes mellitus with hyperglycemia: Secondary | ICD-10-CM | POA: Diagnosis not present

## 2023-09-01 DIAGNOSIS — Z794 Long term (current) use of insulin: Secondary | ICD-10-CM

## 2023-09-01 DIAGNOSIS — I10 Essential (primary) hypertension: Secondary | ICD-10-CM | POA: Diagnosis not present

## 2023-09-01 DIAGNOSIS — E785 Hyperlipidemia, unspecified: Secondary | ICD-10-CM

## 2023-09-01 DIAGNOSIS — E1169 Type 2 diabetes mellitus with other specified complication: Secondary | ICD-10-CM

## 2023-09-01 DIAGNOSIS — I739 Peripheral vascular disease, unspecified: Secondary | ICD-10-CM

## 2023-09-01 LAB — POCT GLYCOSYLATED HEMOGLOBIN (HGB A1C): HbA1c, POC (controlled diabetic range): 11.1 % — AB (ref 0.0–7.0)

## 2023-09-01 MED ORDER — LISINOPRIL 10 MG PO TABS: 10.0000 mg | ORAL_TABLET | Freq: Every day | ORAL | 1 refills | Status: DC

## 2023-09-01 MED ORDER — CILOSTAZOL 50 MG PO TABS: 50.0000 mg | ORAL_TABLET | Freq: Two times a day (BID) | ORAL | 1 refills | Status: AC

## 2023-09-01 MED ORDER — FREESTYLE LIBRE 2 SENSOR MISC: 11 refills | Status: DC

## 2023-09-01 MED ORDER — INSULIN GLARGINE SOLOSTAR 100 UNIT/ML ~~LOC~~ SOPN: 45.0000 [IU] | PEN_INJECTOR | Freq: Every day | SUBCUTANEOUS | 1 refills | Status: DC

## 2023-09-01 MED ORDER — INSULIN LISPRO (1 UNIT DIAL) 100 UNIT/ML (KWIKPEN): 20.0000 [IU] | PEN_INJECTOR | Freq: Three times a day (TID) | SUBCUTANEOUS | 1 refills | Status: DC

## 2023-09-01 NOTE — Progress Notes (Signed)
 Subjective:  Patient ID: Cheryl Parker, female    DOB: November 27, 1980  Age: 43 y.o. MRN: [id number]  CC: Medical Management of Chronic Issues     Discussed the use of AI scribe software for clinical note transcription with the patient, who gave verbal consent to proceed.  History of Present Illness Cheryl Parker is a 43 year old female with type two diabetes mellitus, hyperlipidemia, hypertension, peripheral vascular disease who presents for a follow-up visit.  Her type two diabetes mellitus is poorly controlled, with a recent A1c of 11.8, up from 9.8. She takes 32 units of short-acting insulin  with meals three times a day and 26 units of long-acting insulin  at night which she informs me the clinical pharmacist had advised her to take however her med list reveals she should be on 20 units of short acting insulin  3 times a day with meals and 60 units once a day at night.  I have reviewed this CPPs note and do not see anything like she is suggesting.  Blood sugar levels remain high, often between 300-320 mg/dL, with the lowest being 245 mg/dL.   Hypertension was slightly elevated today, though it was normal during her last visit in September. She confirms taking her blood pressure medication today.  For hyperlipidemia, she takes Crestor  regularly, preferring nighttime administration due to daytime side effects.  She has peripheral arterial disease with circulation problems in her left leg, causing pain when walking. She is not taking cilostazol  due to pharmacy access issues and has switched her [facility] to [facility].  She did see vascular last year and per notes no intervention recommended at this time until symptoms are disabling. She has quit smoking. She experiences pain in her left leg when walking.    Past Medical History:  Diagnosis Date   DKA (diabetic ketoacidoses) 09/10/2014; 06/02/2017   GERD (gastroesophageal reflux disease)    Gestational diabetes    Headache     Hypertension    Nausea & vomiting 07/2017   Pregnancy induced hypertension    Type 1 diabetes mellitus (HCC)    Insulin  dependent    Past Surgical History:  Procedure Laterality Date   CESAREAN SECTION  2011   CESAREAN SECTION N/A 07/05/2012   Procedure: CESAREAN SECTION;  Surgeon: Verlyn Goad, MD;  Location: [facility] ORS;  Service: Obstetrics;  Laterality: N/A;   CESAREAN SECTION N/A 06/25/2015   Procedure: CESAREAN SECTION;  Surgeon: Verlyn Goad, MD;  Location: [facility];  Service: Obstetrics;  Laterality: N/A;   ESOPHAGOGASTRODUODENOSCOPY (EGD) WITH PROPOFOL  Left 12/28/2016   Procedure: ESOPHAGOGASTRODUODENOSCOPY (EGD) WITH PROPOFOL ;  Surgeon: Genell Ken, MD;  Location: [facility] ENDOSCOPY;  Service: Gastroenterology;  Laterality: Left;    Family History  Problem Relation Age of Onset   Diabetes Mother    Kidney disease Father    Birth defects Son        unilateral renal agenesis    Social History   Socioeconomic History   Marital status: Single    Spouse name: Not on file   Number of children: Not on file   Years of education: Not on file   Highest education level: GED or equivalent  Occupational History   Not on file  Tobacco Use   Smoking status: Former    Current packs/day: 0.10    Average packs/day: 0.1 packs/day for 17.0 years (1.7 ttl pk-yrs)    Types: Cigarettes   Smokeless tobacco: Never   Tobacco comments:    06/02/2017 "quit ~ 6 months ago"  Vaping Use   Vaping status: Never Used  Substance and Sexual Activity   Alcohol use: No   Drug use: No   Sexual activity: Not on file  Other Topics Concern   Not on file  Social History Narrative   Not on file   Social Drivers of Health   Financial Resource Strain: Low Risk  (06/03/2023)   Overall Financial Resource Strain (CARDIA)    Difficulty of Paying Living Expenses: Not hard at all  Food Insecurity: No Food Insecurity (06/03/2023)   Hunger Vital Sign    Worried About Running Out of Food in the Last Year: Never true     Ran Out of Food in the Last Year: Never true  Transportation Needs: No Transportation Needs (06/03/2023)   PRAPARE - Administrator, Civil Service (Medical): No    Lack of Transportation (Non-Medical): No  Physical Activity: Sufficiently Active (06/03/2023)   Exercise Vital Sign    Days of Exercise per Week: 4 days    Minutes of Exercise per Session: 60 min  Stress: No Stress Concern Present (06/03/2023)   [employer] of Occupational Health - Occupational Stress Questionnaire    Feeling of Stress : Not at all  Social Connections: Moderately Integrated (06/03/2023)   Social Connection and Isolation Panel [NHANES]    Frequency of Communication with Friends and Family: More than three times a week    Frequency of Social Gatherings with Friends and Family: More than three times a week    Attends Religious Services: 1 to 4 times per year    Active Member of [employer] or Organizations: No    Attends Engineer, structural: Not on file    Marital Status: Living with partner    Allergies  Allergen Reactions   Insulin  Aspart Prot & Aspart Hives and Itching    Immediately after taking, developed itching and hives    Outpatient Medications Prior to Visit  Medication Sig Dispense Refill   Accu-Chek Softclix Lancets lancets Use to check blood sugar 3 times daily. E11.65 100 each 6   Blood Glucose Monitoring Suppl (ACCU-CHEK GUIDE) w/Device KIT Use to check blood sugar 3 times daily. E11.65 1 kit 0   Continuous Glucose Receiver (FREESTYLE LIBRE 2 READER) DEVI Check blood sugar continuously. Dx: E11.65 1 each 0   glucose blood (ACCU-CHEK GUIDE TEST) test strip Use to check blood sugar 3 times daily. E11.65 100 each 6   Insulin  Pen Needle (B-D ULTRAFINE III SHORT PEN) 31G X 8 MM MISC Use to inject insulin  a total of 4 times daily. 400 each 1   rosuvastatin  (CRESTOR ) 20 MG tablet Take 1 tablet (20 mg total) by mouth daily. 90 tablet 1   terbinafine (LAMISIL) 250 MG tablet Take 250 mg  by mouth daily.     Continuous Blood Gluc Sensor (FREESTYLE LIBRE 2 SENSOR) MISC Check blood sugar continuously. Change sensors once every 14 days. Dx: E11.65 2 each 3   Insulin  Glargine Solostar (LANTUS ) 100 UNIT/ML Solostar Pen Inject 60 Units into the skin daily. 60 mL 1   insulin  lispro (HUMALOG  KWIKPEN) 100 UNIT/ML KwikPen Inject 20 Units into the skin 3 (three) times daily before meals. 60 mL 1   lisinopril  (ZESTRIL ) 10 MG tablet Take 1 tablet (10 mg total) by mouth daily. 90 tablet 1   cetirizine  (ZYRTEC  ALLERGY) 10 MG tablet Take 1 tablet (10 mg total) by mouth daily. 30 tablet 0   fluticasone  (FLONASE ) 50 MCG/ACT nasal spray Place 2  sprays into both nostrils daily for 14 days. 11.1 mL 0   cilostazol  (PLETAL ) 50 MG tablet Take 1 tablet (50 mg total) by mouth 2 (two) times daily. 60 tablet 0   No facility-administered medications prior to visit.     ROS Review of Systems  Constitutional:  Negative for activity change and appetite change.  HENT:  Negative for sinus pressure and sore throat.   Respiratory:  Negative for chest tightness, shortness of breath and wheezing.   Cardiovascular:  Negative for chest pain and palpitations.  Gastrointestinal:  Negative for abdominal distention, abdominal pain and constipation.  Genitourinary: Negative.   Musculoskeletal: Negative.   Psychiatric/Behavioral:  Negative for behavioral problems and dysphoric mood.     Objective:  BP (!) 146/89   Pulse (!) 101   Ht 5\' 2"  (1.575 m)   Wt 167 lb 9.6 oz (76 kg)   SpO2 99%   BMI 30.65 kg/m      09/01/2023   10:38 AM 09/01/2023   10:14 AM 01/19/2023    8:40 AM  BP/Weight  Systolic BP 146 147 120  Diastolic BP 89 91 80  Wt. (Lbs)  167.6 177  BMI  30.65 kg/m2 32.37 kg/m2      Physical Exam Constitutional:      Appearance: She is well-developed.  Cardiovascular:     Rate and Rhythm: Tachycardia present.     Heart sounds: Normal heart sounds. No murmur heard. Pulmonary:     Effort:  Pulmonary effort is normal.     Breath sounds: Normal breath sounds. No wheezing or rales.  Chest:     Chest wall: No tenderness.  Abdominal:     General: Bowel sounds are normal. There is no distension.     Palpations: Abdomen is soft. There is no mass.     Tenderness: There is no abdominal tenderness.  Musculoskeletal:        General: Normal range of motion.     Right lower leg: No edema.     Left lower leg: No edema.  Neurological:     Mental Status: She is alert and oriented to person, place, and time.  Psychiatric:        Mood and Affect: Mood normal.        Latest Ref Rng & Units 01/19/2023    9:20 AM 07/12/2022    3:01 PM 06/25/2021   11:33 AM  CMP  Glucose 70 - 99 mg/dL 161  096  045   BUN 6 - 24 mg/dL 9  7  8    Creatinine 0.57 - 1.00 mg/dL 4.09  8.11  9.14   Sodium 134 - 144 mmol/L 135  138  138   Potassium 3.5 - 5.2 mmol/L 4.2  4.2  4.3   Chloride 96 - 106 mmol/L 96  101  101   CO2 20 - 29 mmol/L 23  22  20    Calcium  8.7 - 10.2 mg/dL 9.7  9.5  9.8   Total Protein 6.0 - 8.5 g/dL 6.4   6.8   Total Bilirubin 0.0 - 1.2 mg/dL <7.8   0.2   Alkaline Phos 44 - 121 IU/L 129   185   AST 0 - 40 IU/L 33   32   ALT 0 - 32 IU/L 28   89     Lipid Panel     Component Value Date/Time   CHOL 304 (H) 01/19/2023 0920   TRIG 98 01/19/2023 0920   HDL 59 01/19/2023 0920  CHOLHDL 4.6 (H) 06/25/2021 1133   CHOLHDL 9.2 12/27/2016 0343   VLDL 19 12/27/2016 0343   LDLCALC 228 (H) 01/19/2023 0920    CBC    Component Value Date/Time   WBC 11.4 (H) 01/19/2023 0920   WBC 8.4 11/11/2019 0621   RBC 4.49 01/19/2023 0920   RBC 3.70 (L) 11/11/2019 0621   HGB 14.5 01/19/2023 0920   HGB 12.8 01/10/2012 0000   HCT 44.3 01/19/2023 0920   PLT 298 01/19/2023 0920   PLT 372 01/10/2012 0000   MCV 99 (H) 01/19/2023 0920   MCH 32.3 01/19/2023 0920   MCH 31.4 11/11/2019 0621   MCHC 32.7 01/19/2023 0920   MCHC 32.7 11/11/2019 0621   RDW 11.8 01/19/2023 0920   LYMPHSABS 2.6 01/19/2023 0920    MONOABS 0.8 11/11/2019 0621   EOSABS 0.0 01/19/2023 0920   BASOSABS 0.0 01/19/2023 0920    Lab Results  Component Value Date   HGBA1C 11.1 (A) 09/01/2023       Assessment & Plan Type 2 Diabetes Mellitus, uncontrolled Uncontrolled diabetes with A1c of 11.8 and blood glucose 300-320 mg/dL. Current insulin  regimen ineffective. Adjustments needed for better glycemic control. -She did have misunderstanding about her insulin  regimen - Increase Lantus  to 45 units at night, decrease Humalog  to 20 units three times daily with meals. - Increase Lantus  to 50 units if blood sugars remain above 200 mg/dL. - Schedule follow-up in one month to evaluate blood glucose control and adjust insulin  as needed. - Refill Humalog  prescription and continuous glucose monitor sensors. - Encourage use of continuous glucose monitor to track blood sugar levels and prevent hypoglycemia.  Hypertension associated with type 2 diabetes mellitus Slightly elevated blood pressure today, normal in September. Currently on Lisinopril . - Recheck blood pressure during the visit still elevated - Evaluate blood pressure control at follow-up in one month and adjust treatment if necessary.  Hyperlipidemia associated with type 2 diabetes mellitus Managed with Rosuvastatin , taken at night due to side effects. Awaiting cholesterol levels for dosage evaluation. - Order blood work to check cholesterol levels. - Evaluate cholesterol results tomorrow to determine if Rosuvastatin  dosage adjustment is necessary.  Peripheral Arterial Disease She does have intermittent claudication in the left leg. Cilostazol  prescribed but not received due to pharmacy issues. Switched to [facility]. - Refill Cilostazol  prescription and send to [facility] pharmacy. - Encourage adherence to Cilostazol  for circulation improvement. - Risk factor modification including diabetes, blood pressure control and statin use        Meds ordered this encounter   Medications   Insulin  Glargine Solostar (LANTUS ) 100 UNIT/ML Solostar Pen    Sig: Inject 45 Units into the skin daily.    Dispense:  60 mL    Refill:  1   cilostazol  (PLETAL ) 50 MG tablet    Sig: Take 1 tablet (50 mg total) by mouth 2 (two) times daily.    Dispense:  180 tablet    Refill:  1   insulin  lispro (HUMALOG  KWIKPEN) 100 UNIT/ML KwikPen    Sig: Inject 20 Units into the skin 3 (three) times daily before meals.    Dispense:  60 mL    Refill:  1   lisinopril  (ZESTRIL ) 10 MG tablet    Sig: Take 1 tablet (10 mg total) by mouth daily.    Dispense:  90 tablet    Refill:  1   Continuous Glucose Sensor (FREESTYLE LIBRE 2 SENSOR) MISC    Sig: Check blood sugar continuously. Change sensors once every  14 days. Dx: E11.65    Dispense:  3 each    Refill:  11    Follow-up: Return in about 1 month (around 10/02/2023) for Diabetes follow-up with PCP.       Joaquin Mulberry, MD, FAAFP. [facility] [place], [place] [phone]   09/01/2023, 10:52 AM

## 2023-09-01 NOTE — Patient Instructions (Signed)
 VISIT SUMMARY:  Today, you had a follow-up visit to address your type 2 diabetes, hypertension, hyperlipidemia, and peripheral arterial disease. We discussed your current medications, recent lab results, and made some adjustments to your treatment plan to better manage your conditions.  YOUR PLAN:  -TYPE 2 DIABETES MELLITUS, UNCONTROLLED: Your diabetes is not well controlled, with high blood sugar levels and an A1c of 11.8. We have adjusted your insulin  regimen to help lower your blood sugar. You should now take 45 units of Lantus  at night and 20 units of Humalog  with each meal. If your blood sugar remains above 200 mg/dL, increase Lantus  to 50 units. Please use your continuous glucose monitor to track your levels and prevent low blood sugar. We will follow up in one month to see how you are doing.  -HYPERTENSION ASSOCIATED WITH TYPE 2 DIABETES MELLITUS: Your blood pressure was slightly elevated today. We will recheck it during this visit and evaluate it again in one month to see if any changes to your medication are needed.  -HYPERLIPIDEMIA ASSOCIATED WITH TYPE 2 DIABETES MELLITUS: You are taking Rosuvastatin  to manage your cholesterol levels. We will order blood work to check your cholesterol and evaluate the results at your next visit to see if any adjustments to your medication are needed.  -PERIPHERAL ARTERIAL DISEASE: You have circulation problems in your left leg causing pain when you walk. We have refilled your Cilostazol  prescription and sent it to [facility]. Please take this medication as prescribed to help improve your circulation.  INSTRUCTIONS:  Please follow up in one month to evaluate your blood glucose control, blood pressure, and cholesterol levels. Use your continuous glucose monitor regularly and take your medications as prescribed. If you have any issues or concerns before your next appointment, do not hesitate to contact our office.

## 2023-09-02 ENCOUNTER — Other Ambulatory Visit: Payer: Self-pay | Admitting: Family Medicine

## 2023-09-04 LAB — CMP14+EGFR
ALT: 124 IU/L — ABNORMAL HIGH (ref 0–32)
AST: 72 IU/L — ABNORMAL HIGH (ref 0–40)
Albumin: 4.3 g/dL (ref 3.9–4.9)
Alkaline Phosphatase: 231 IU/L — ABNORMAL HIGH (ref 44–121)
BUN/Creatinine Ratio: 9 (ref 9–23)
BUN: 6 mg/dL (ref 6–24)
Bilirubin Total: 0.3 mg/dL (ref 0.0–1.2)
CO2: 22 mmol/L (ref 20–29)
Calcium: 9.5 mg/dL (ref 8.7–10.2)
Chloride: 98 mmol/L (ref 96–106)
Creatinine, Ser: 0.68 mg/dL (ref 0.57–1.00)
Globulin, Total: 2.1 g/dL (ref 1.5–4.5)
Glucose: 215 mg/dL — ABNORMAL HIGH (ref 70–99)
Potassium: 4 mmol/L (ref 3.5–5.2)
Sodium: 136 mmol/L (ref 134–144)
Total Protein: 6.4 g/dL (ref 6.0–8.5)
eGFR: 111 mL/min/{1.73_m2} (ref >59)

## 2023-09-04 LAB — LP+NON-HDL CHOLESTEROL
Cholesterol, Total: 250 mg/dL — ABNORMAL HIGH (ref 100–199)
HDL: 57 mg/dL (ref >39)
LDL Chol Calc (NIH): 159 mg/dL — ABNORMAL HIGH (ref 0–99)
Total Non-HDL-Chol (LDL+VLDL): 193 mg/dL — ABNORMAL HIGH (ref 0–129)
Triglycerides: 189 mg/dL — ABNORMAL HIGH (ref 0–149)
VLDL Cholesterol Cal: 34 mg/dL (ref 5–40)

## 2023-09-04 LAB — MICROALBUMIN / CREATININE URINE RATIO
Creatinine, Urine: 70.7 mg/dL
Microalb/Creat Ratio: 43 mg/g{creat} — ABNORMAL HIGH (ref 0–29)
Microalbumin, Urine: 30.6 ug/mL

## 2023-09-05 ENCOUNTER — Encounter: Payer: Self-pay | Admitting: Family Medicine

## 2023-09-05 ENCOUNTER — Telehealth: Payer: Self-pay

## 2023-09-05 ENCOUNTER — Other Ambulatory Visit: Payer: Self-pay

## 2023-09-05 LAB — LIPOPROTEIN A (LPA): Lipoprotein (a): 15 nmol/L (ref <75.0)

## 2023-09-05 LAB — SPECIMEN STATUS REPORT

## 2023-09-05 NOTE — Telephone Encounter (Signed)
 Pharmacy Patient Advocate Encounter  Received notification from [facility] that Prior Authorization for FREESTYLE LIBRE 2 SENSOR has been APPROVED from 09/05/2023 to 03/03/2024   PA #/Case ID/Reference #: [id number]

## 2023-09-23 ENCOUNTER — Other Ambulatory Visit: Payer: Self-pay | Admitting: Family Medicine

## 2023-09-23 DIAGNOSIS — E1165 Type 2 diabetes mellitus with hyperglycemia: Secondary | ICD-10-CM

## 2023-10-03 ENCOUNTER — Ambulatory Visit: Admitting: Pharmacist

## 2023-10-10 ENCOUNTER — Ambulatory Visit: Payer: Self-pay | Admitting: Family Medicine

## 2023-10-17 ENCOUNTER — Ambulatory Visit: Admitting: Family Medicine

## 2023-10-22 ENCOUNTER — Other Ambulatory Visit (INDEPENDENT_AMBULATORY_CARE_PROVIDER_SITE_OTHER): Payer: Self-pay | Admitting: Family Medicine

## 2023-10-27 ENCOUNTER — Encounter: Payer: Self-pay | Admitting: Pharmacist

## 2023-10-27 ENCOUNTER — Ambulatory Visit: Attending: Family Medicine | Admitting: Pharmacist

## 2023-10-27 DIAGNOSIS — Z794 Long term (current) use of insulin: Secondary | ICD-10-CM | POA: Diagnosis not present

## 2023-10-27 DIAGNOSIS — E1165 Type 2 diabetes mellitus with hyperglycemia: Secondary | ICD-10-CM

## 2023-10-27 MED ORDER — BD PEN NEEDLE SHORT ULTRAFINE 31G X 8 MM MISC: 1 refills | Status: DC

## 2023-10-27 MED ORDER — INSULIN GLARGINE SOLOSTAR 100 UNIT/ML ~~LOC~~ SOPN: 52.0000 [IU] | PEN_INJECTOR | Freq: Every day | SUBCUTANEOUS | 1 refills | Status: DC

## 2023-10-27 NOTE — Progress Notes (Signed)
 S:     No chief complaint on file.  Cheryl Parker is a 43 y.o. female who presents for diabetes evaluation, education, and management. PMH is significant for HTN, T2DM (complicated w/ hx of DKA). Patient was referred and last seen by Primary Care Provider, Dr. Delbert, on 09/01/2023. Pharmacy has seen pt before with her most recent visit occurring on 06/07/2023.   Today, patient arrives in good spirits and presents without any assistance. At her most recent visit with Dr. Newlin, she her A1c was 11.1. Additionally her urine albumin to creatinine ratio showed evidence of nephropathy. Her LDL was 159 at that visit but she is currently holding rosuvastatin  due to an elevation in her liver enzymes. Her insulin  regimen was adjusted.   Family/Social History:  -Fhx: DM, kidney disease  -Tobacco: former smoker  -Alcohol: none reported   Current diabetes medications include: Lantus  45 units daily, Humalog  20u TID Patient reports medication adherence but takes differently than prescribed.  Insurance coverage:  Medicaid   Patient denies hypoglycemic events.  Patient denies nocturia (nighttime urination).  Patient denies neuropathy (nerve pain). Patient denies visual changes. Patient reports self foot exams.   Patient reported dietary habits:  -Denies any changes since I saw her last -Previously reported dietary habits are as follows:  Eats 2 meals/day Breakfast: [place] bacon, eggs, piece of bread, may have cereal Lunch: skips lately, usually eats leftovers otherwise Dinner: burger, hot dog, green beans, salad, usually eats at home Snacks: crackers, chips, apple, orange Drinks: water, sometimes coffee, stopped drinking sodas 1.5 years ago  Patient-reported exercise habits: on her feet daily with childcare, walks a few times a day  O:   ROS  Physical Exam  7 day average blood glucose: no meter with her today  CGM is not in place.   Reported CBGs: 170 - 270s.   Lab Results   Component Value Date   HGBA1C 11.1 (A) 09/01/2023   There were no vitals filed for this visit.  Lipid Panel     Component Value Date/Time   CHOL 250 (H) 09/01/2023 1046   TRIG 189 (H) 09/01/2023 1046   HDL 57 09/01/2023 1046   CHOLHDL 4.6 (H) 06/25/2021 1133   CHOLHDL 9.2 12/27/2016 0343   VLDL 19 12/27/2016 0343   LDLCALC 159 (H) 09/01/2023 1046    Clinical Atherosclerotic Cardiovascular Disease (ASCVD): No  The 10-year ASCVD risk score (Arnett DK, et al., 2019) is: 10%   Values used to calculate the score:     Age: 13 years     Clincally relevant sex: Female     Is Non-Hispanic African American: Yes     Diabetic: Yes     Tobacco smoker: No     Systolic Blood Pressure: 146 mmHg     Is BP treated: Yes     HDL Cholesterol: 57 mg/dL     Total Cholesterol: 250 mg/dL   Patient is participating in a Managed Medicaid Plan:  Yes   A/P: Diabetes longstanding currently uncontrolled. Home sugars are still above goal. Patient is able to verbalize appropriate hypoglycemia management plan but is not currently hypoglycemia.  -Increase Lantus  to 52 units daily.  -Increase Humalog  to 20u TID before meals.  -Patient educated on purpose, proper use, and potential adverse effects of Lantus , Humalog .  -Extensively discussed pathophysiology of diabetes, recommended lifestyle interventions, dietary effects on blood sugar control.  -Counseled on s/sx of and management of hypoglycemia.  -A1c 11/2023.  Written patient instructions provided. Patient verbalized  understanding of treatment plan. Total time in face to face counseling 30 minutes.    Follow-up:  Pharmacist in 2 months. Dr. Newlin on 11/14/2023.  Herlene Fleeta Morris, PharmD, JAQUELINE, CPP Clinical Pharmacist [facility] [phone]

## 2023-11-11 ENCOUNTER — Telehealth: Payer: Self-pay | Admitting: Family Medicine

## 2023-11-11 NOTE — Telephone Encounter (Signed)
 Contacted pt left vm to confirmed appt again pt (confirmed from phone)

## 2023-11-14 ENCOUNTER — Encounter: Payer: Self-pay | Admitting: Family Medicine

## 2023-11-14 ENCOUNTER — Ambulatory Visit: Attending: Family Medicine | Admitting: Family Medicine

## 2023-11-14 VITALS — BP 144/88 | HR 103 | Ht 62.0 in | Wt 171.6 lb

## 2023-11-14 DIAGNOSIS — E1165 Type 2 diabetes mellitus with hyperglycemia: Secondary | ICD-10-CM

## 2023-11-14 DIAGNOSIS — Z794 Long term (current) use of insulin: Secondary | ICD-10-CM

## 2023-11-14 DIAGNOSIS — R7989 Other specified abnormal findings of blood chemistry: Secondary | ICD-10-CM | POA: Diagnosis not present

## 2023-11-14 LAB — GLUCOSE, POCT (MANUAL RESULT ENTRY): POC Glucose: 163 mg/dL — AB (ref 70–99)

## 2023-11-14 MED ORDER — INSULIN LISPRO (1 UNIT DIAL) 100 UNIT/ML (KWIKPEN): 15.0000 [IU] | PEN_INJECTOR | Freq: Three times a day (TID) | SUBCUTANEOUS | 1 refills | Status: DC

## 2023-11-14 MED ORDER — INSULIN GLARGINE SOLOSTAR 100 UNIT/ML ~~LOC~~ SOPN: 55.0000 [IU] | PEN_INJECTOR | Freq: Every day | SUBCUTANEOUS | 1 refills | Status: DC

## 2023-11-14 NOTE — Progress Notes (Signed)
 Subjective:  Patient ID: Cheryl Parker, female    DOB: [birth year]  Age: 43 y.o. MRN: [id number]  CC: Medical Management of Chronic Issues     Discussed the use of AI scribe software for clinical note transcription with the patient, who gave verbal consent to proceed.  History of Present Illness Cheryl Parker is a 43 year old female with type two diabetes mellitus, hyperlipidemia, hypertension, peripheral vascular disease  who presents for follow-up regarding her insulin  regimen and blood sugar control.  She takes 45 units of Lantus  at bedtime and 25 units of NovoLog  three times daily. Her blood sugar levels are in the low 200s, with previous levels reaching 300 to 400, and the lowest at 170. Fasting blood sugar levels are around 170. She eats once a day with snacks in between due to her work schedule. She has not yet used the continuous glucose monitor she received. Denies presence of additional concerns today.  Last set of labs had revealed elevated LFTs compared to 7 months prior when they were normal and she was advised via MyChart to hold off on her statin so we can repeat LFTs but she continues to take her statin. Past Medical History:  Diagnosis Date   DKA (diabetic ketoacidoses) 09/10/2014; 06/02/2017   GERD (gastroesophageal reflux disease)    Gestational diabetes    Headache    Hypertension    Nausea & vomiting 07/2017   Pregnancy induced hypertension    Type 1 diabetes mellitus (HCC)    Insulin  dependent    Past Surgical History:  Procedure Laterality Date   CESAREAN SECTION  2011   CESAREAN SECTION N/A 07/05/2012   Procedure: CESAREAN SECTION;  Surgeon: Winton Felt, MD;  Location: [facility];  Service: Obstetrics;  Laterality: N/A;   CESAREAN SECTION N/A 06/25/2015   Procedure: CESAREAN SECTION;  Surgeon: Winton Felt, MD;  Location: [facility];  Service: Obstetrics;  Laterality: N/A;   ESOPHAGOGASTRODUODENOSCOPY (EGD) WITH PROPOFOL  Left 12/28/2016   Procedure:  ESOPHAGOGASTRODUODENOSCOPY (EGD) WITH PROPOFOL ;  Surgeon: Saintclair Jasper, MD;  Location: [facility] END[facility]COPY;  Service: Gastroenterology;  Laterality: Left;    Family History  Problem Relation Age of Onset   Diabetes Mother    Kidney disease Father    Birth defects Son        unilateral renal agenesis    Social History   Socioeconomic History   Marital status: Single    Spouse name: Not on file   Number of children: Not on file   Years of education: Not on file   Highest education level: 12th grade  Occupational History   Not on file  Tobacco Use   Smoking status: Former    Current packs/day: 0.10    Average packs/day: 0.1 packs/day for 17.0 years (1.7 ttl pk-yrs)    Types: Cigarettes   Smokeless tobacco: Never   Tobacco comments:    06/02/2017 quit ~ 6 months ago  Vaping Use   Vaping status: Never Used  Substance and Sexual Activity   Alcohol use: No   Drug use: No   Sexual activity: Not on file  Other Topics Concern   Not on file  Social History Narrative   Not on file   Social Drivers of Health   Financial Resource Strain: Low Risk  (10/24/2023)   Overall Financial Resource Strain (CARDIA)    Difficulty of Paying Living Expenses: Not hard at all  Food Insecurity: No Food Insecurity (10/24/2023)   Hunger Vital Sign    Worried About  Running Out of Food in the Last Year: Never true    Ran Out of Food in the Last Year: Never true  Transportation Needs: No Transportation Needs (10/24/2023)   PRAPARE - Administrator, Civil Service (Medical): No    Lack of Transportation (Non-Medical): No  Physical Activity: Sufficiently Active (10/24/2023)   Exercise Vital Sign    Days of Exercise per Week: 3 days    Minutes of Exercise per Session: 70 min  Stress: No Stress Concern Present (10/24/2023)   [employer] of Occupational Health - Occupational Stress Questionnaire    Feeling of Stress: Not at all  Social Connections: Moderately Integrated (10/24/2023)   Social  Connection and Isolation Panel    Frequency of Communication with Friends and Family: More than three times a week    Frequency of Social Gatherings with Friends and Family: Three times a week    Attends Religious Services: More than 4 times per year    Active Member of Clubs or Organizations: No    Attends Engineer, structural: Not on file    Marital Status: Living with partner    Allergies  Allergen Reactions   Insulin  Aspart Prot & Aspart Hives and Itching    Immediately after taking, developed itching and hives    Outpatient Medications Prior to Visit  Medication Sig Dispense Refill   Accu-Chek Softclix Lancets lancets Use to check blood sugar 3 times daily. E11.65 100 each 6   Blood Glucose Monitoring Suppl (ACCU-CHEK GUIDE) w/Device KIT Use to check blood sugar 3 times daily. E11.65 1 kit 0   glucose blood (ACCU-CHEK GUIDE TEST) test strip TEST THREE TIMES DAILY 200 strip 11   Insulin  Pen Needle (BD PEN NEEDLE SHORT ULTRAFINE) 31G X 8 MM MISC Use to inject insulin  four times daily. 400 each 1   lisinopril  (ZESTRIL ) 10 MG tablet Take 1 tablet (10 mg total) by mouth daily. 90 tablet 1   rosuvastatin  (CRESTOR ) 20 MG tablet Take 1 tablet (20 mg total) by mouth daily. 90 tablet 1   Insulin  Glargine Solostar (LANTUS ) 100 UNIT/ML Solostar Pen Inject 52 Units into the skin daily. 45 mL 1   insulin  lispro (HUMALOG  KWIKPEN) 100 UNIT/ML KwikPen Inject 20 Units into the skin 3 (three) times daily before meals. 60 mL 1   cilostazol  (PLETAL ) 50 MG tablet Take 1 tablet (50 mg total) by mouth 2 (two) times daily. (Patient not taking: Reported on 11/14/2023) 180 tablet 1   Continuous Glucose Receiver (FREESTYLE LIBRE 2 READER) DEVI Check blood sugar continuously. Dx: E11.65 (Patient not taking: Reported on 11/14/2023) 1 each 0   Continuous Glucose Sensor (FREESTYLE LIBRE 2 SENSOR) MISC Check blood sugar continuously. Change sensors once every 14 days. Dx: E11.65 (Patient not taking: Reported  on 11/14/2023) 3 each 11   No facility-administered medications prior to visit.     ROS Review of Systems  Constitutional:  Negative for activity change and appetite change.  HENT:  Negative for sinus pressure and sore throat.   Respiratory:  Negative for chest tightness, shortness of breath and wheezing.   Cardiovascular:  Negative for chest pain and palpitations.  Gastrointestinal:  Negative for abdominal distention, abdominal pain and constipation.  Genitourinary: Negative.   Musculoskeletal: Negative.   Psychiatric/Behavioral:  Negative for behavioral problems and dysphoric mood.     Objective:  BP (!) 144/88   Pulse (!) 103   Ht 5' 2 (1.575 m)   Wt 171 lb 9.6 oz (77.8  kg)   SpO2 99%   BMI 31.39 kg/m      11/14/2023   11:04 AM 09/01/2023   10:38 AM 09/01/2023   10:14 AM  BP/Weight  Systolic BP 144 146 147  Diastolic BP 88 89 91  Wt. (Lbs) 171.6  167.6  BMI 31.39 kg/m2  30.65 kg/m2      Physical Exam Constitutional:      Appearance: She is well-developed.  Cardiovascular:     Rate and Rhythm: Tachycardia present.     Heart sounds: Normal heart sounds. No murmur heard. Pulmonary:     Effort: Pulmonary effort is normal.     Breath sounds: Normal breath sounds. No wheezing or rales.  Chest:     Chest wall: No tenderness.  Abdominal:     General: Bowel sounds are normal. There is no distension.     Palpations: Abdomen is soft. There is no mass.     Tenderness: There is no abdominal tenderness.  Musculoskeletal:        General: Normal range of motion.     Right lower leg: No edema.     Left lower leg: No edema.  Neurological:     Mental Status: She is alert and oriented to person, place, and time.  Psychiatric:        Mood and Affect: Mood normal.        Latest Ref Rng & Units 09/01/2023   10:46 AM 01/19/2023    9:20 AM 07/12/2022    3:01 PM  CMP  Glucose 70 - 99 mg/dL 784  793  753   BUN 6 - 24 mg/dL 6  9  7    Creatinine 0.57 - 1.00 mg/dL 9.31  9.39   9.44   Sodium 134 - 144 mmol/L 136  135  138   Potassium 3.5 - 5.2 mmol/L 4.0  4.2  4.2   Chloride 96 - 106 mmol/L 98  96  101   CO2 20 - 29 mmol/L 22  23  22    Calcium  8.7 - 10.2 mg/dL 9.5  9.7  9.5   Total Protein 6.0 - 8.5 g/dL 6.4  6.4    Total Bilirubin 0.0 - 1.2 mg/dL 0.3  <9.7    Alkaline Phos 44 - 121 IU/L 231  129    AST 0 - 40 IU/L 72  33    ALT 0 - 32 IU/L 124  28      Lipid Panel     Component Value Date/Time   CHOL 250 (H) 09/01/2023 1046   TRIG 189 (H) 09/01/2023 1046   HDL 57 09/01/2023 1046   CHOLHDL 4.6 (H) 06/25/2021 1133   CHOLHDL 9.2 12/27/2016 0343   VLDL 19 12/27/2016 0343   LDLCALC 159 (H) 09/01/2023 1046    CBC    Component Value Date/Time   WBC 11.4 (H) 01/19/2023 0920   WBC 8.4 11/11/2019 0621   RBC 4.49 01/19/2023 0920   RBC 3.70 (L) 11/11/2019 0621   HGB 14.5 01/19/2023 0920   HGB 12.8 01/10/2012 0000   HCT 44.3 01/19/2023 0920   PLT 298 01/19/2023 0920   PLT 372 01/10/2012 0000   MCV 99 (H) 01/19/2023 0920   MCH 32.3 01/19/2023 0920   MCH 31.4 11/11/2019 0621   MCHC 32.7 01/19/2023 0920   MCHC 32.7 11/11/2019 0621   RDW 11.8 01/19/2023 0920   LYMPHSABS 2.6 01/19/2023 0920   MONOABS 0.8 11/11/2019 0621   EOSABS 0.0 01/19/2023 0920   BASOSABS 0.0  01/19/2023 0920    Lab Results  Component Value Date   HGBA1C 11.1 (A) 09/01/2023       Assessment & Plan Type 2 Diabetes Mellitus, uncontrolled Blood glucose levels remain elevated. Current insulin  regimen insufficient for glycemic control. Adjustments to Lantus  and NovoLog  planned to improve fasting glucose and prevent hypoglycemia. Dietary recommendations reviewed. - Increase Lantus  to 55 units at bedtime, consider 60 units if hyperglycemia persists. - Decrease NovoLog  to 15 units three times daily before meals. - Instruct her to monitor blood glucose levels closely and adjust insulin  as directed. - Schedule follow-up in two months to reassess blood glucose control and check A1c  levels.  Elevated LFTs Elevated LFTs in 08/2023 which were normal 7 months prior Advised to hold statin but she never did Will check LFTs today again and if still elevated she will need to hold her statin so we can reassess.  Meds ordered this encounter  Medications   Insulin  Glargine Solostar (LANTUS ) 100 UNIT/ML Solostar Pen    Sig: Inject 55 Units into the skin daily.    Dispense:  45 mL    Refill:  1    Dose increase   insulin  lispro (HUMALOG  KWIKPEN) 100 UNIT/ML KwikPen    Sig: Inject 15 Units into the skin 3 (three) times daily before meals.    Dispense:  60 mL    Refill:  1    Follow-up: Return in about 2 months (around 01/15/2024) for Diabetes follow-up with PCP.       Corrina Sabin, MD, FAAFP. [facility] [place], [place] [phone]   11/14/2023, 11:37 AM

## 2023-11-14 NOTE — Patient Instructions (Signed)
 Please increase your Lantus  to 55 units at bedtime and decrease NovoLog  to 15 units 3 times a day with meals. If random blood sugars remain above 200 and fasting blood sugars above goal of close to 100 (80-120) increase your Lantus  to 60 units at bedtime.

## 2023-11-15 ENCOUNTER — Ambulatory Visit: Payer: Self-pay | Admitting: Family Medicine

## 2023-11-15 LAB — CMP14+EGFR
ALT: 74 IU/L — ABNORMAL HIGH (ref 0–32)
AST: 58 IU/L — ABNORMAL HIGH (ref 0–40)
Albumin: 4.3 g/dL (ref 3.9–4.9)
Alkaline Phosphatase: 193 IU/L — ABNORMAL HIGH (ref 44–121)
BUN/Creatinine Ratio: 11 (ref 9–23)
BUN: 8 mg/dL (ref 6–24)
Bilirubin Total: 0.4 mg/dL (ref 0.0–1.2)
CO2: 18 mmol/L — ABNORMAL LOW (ref 20–29)
Calcium: 9.5 mg/dL (ref 8.7–10.2)
Chloride: 102 mmol/L (ref 96–106)
Creatinine, Ser: 0.71 mg/dL (ref 0.57–1.00)
Globulin, Total: 2.5 g/dL (ref 1.5–4.5)
Glucose: 165 mg/dL — ABNORMAL HIGH (ref 70–99)
Potassium: 4.5 mmol/L (ref 3.5–5.2)
Sodium: 138 mmol/L (ref 134–144)
Total Protein: 6.8 g/dL (ref 6.0–8.5)
eGFR: 108 mL/min/1.73 (ref >59)

## 2023-12-16 ENCOUNTER — Telehealth: Payer: Self-pay | Admitting: Family Medicine

## 2023-12-16 NOTE — Telephone Encounter (Signed)
Patient appointment has been rescheduled

## 2023-12-16 NOTE — Telephone Encounter (Signed)
 Copied from CRM #[id number]. Topic: Appointments - Appointment Cancel/Reschedule >> Dec 16, 2023  2:02 PM Winona R wrote:  Pt need to resch her appointment with Fleeta Tonia Garnette LITTIE, RPH-CPP on 08/28

## 2023-12-22 ENCOUNTER — Ambulatory Visit: Admitting: Pharmacist

## 2024-01-16 ENCOUNTER — Encounter: Payer: Self-pay | Admitting: Family Medicine

## 2024-01-16 ENCOUNTER — Ambulatory Visit: Attending: Family Medicine | Admitting: Family Medicine

## 2024-01-16 VITALS — BP 126/85 | HR 105 | Ht 62.0 in | Wt 169.6 lb

## 2024-01-16 DIAGNOSIS — Z794 Long term (current) use of insulin: Secondary | ICD-10-CM

## 2024-01-16 DIAGNOSIS — R252 Cramp and spasm: Secondary | ICD-10-CM | POA: Diagnosis not present

## 2024-01-16 DIAGNOSIS — I739 Peripheral vascular disease, unspecified: Secondary | ICD-10-CM

## 2024-01-16 DIAGNOSIS — E1165 Type 2 diabetes mellitus with hyperglycemia: Secondary | ICD-10-CM | POA: Diagnosis not present

## 2024-01-16 DIAGNOSIS — E785 Hyperlipidemia, unspecified: Secondary | ICD-10-CM

## 2024-01-16 DIAGNOSIS — I10 Essential (primary) hypertension: Secondary | ICD-10-CM

## 2024-01-16 DIAGNOSIS — E1169 Type 2 diabetes mellitus with other specified complication: Secondary | ICD-10-CM

## 2024-01-16 LAB — POCT GLYCOSYLATED HEMOGLOBIN (HGB A1C): HbA1c, POC (controlled diabetic range): 11.7 % — AB (ref 0.0–7.0)

## 2024-01-16 MED ORDER — INSULIN LISPRO (1 UNIT DIAL) 100 UNIT/ML (KWIKPEN): 15.0000 [IU] | PEN_INJECTOR | Freq: Three times a day (TID) | SUBCUTANEOUS | 1 refills | Status: DC

## 2024-01-16 MED ORDER — ROSUVASTATIN CALCIUM 20 MG PO TABS: 20.0000 mg | ORAL_TABLET | Freq: Every day | ORAL | 1 refills | Status: AC

## 2024-01-16 MED ORDER — BD PEN NEEDLE SHORT ULTRAFINE 31G X 8 MM MISC
1 refills | Status: AC
Start: 2024-01-16 — End: ?

## 2024-01-16 MED ORDER — INSULIN GLARGINE SOLOSTAR 100 UNIT/ML ~~LOC~~ SOPN: 60.0000 [IU] | PEN_INJECTOR | Freq: Every day | SUBCUTANEOUS | 1 refills | Status: DC

## 2024-01-16 MED ORDER — CYCLOBENZAPRINE HCL 10 MG PO TABS: 10.0000 mg | ORAL_TABLET | Freq: Every evening | ORAL | 1 refills | Status: AC | PRN

## 2024-01-16 NOTE — Patient Instructions (Signed)
 VISIT SUMMARY:  Today, we discussed your diabetes management, leg cramps, peripheral vascular disease, and hypertension. We made some changes to your medication schedule to better fit your lifestyle and addressed your concerns about leg cramps and Gatorade consumption.  YOUR PLAN:  -TYPE 2 DIABETES MELLITUS WITH HYPERGLYCEMIA: Your diabetes is not well controlled, with your A1c at 11.7 and blood sugar levels between 230-250 mg/dL. We are changing your Lantus  dose to 60 units in the morning to help you remember to take it. If your blood sugar remains high, we will increase it to 62 units. You should take 15 units of Humalog  with each meal, but only if you eat. We will also refer you to an endocrinologist for further management. Please make sure you have enough insulin  pen needles and supplies.  -PERIPHERAL VASCULAR DISEASE OF LEFT LEG: This condition affects the circulation in your legs. Managing your diabetes is crucial to prevent worsening of your circulation issues. If your leg symptoms get worse, you should follow up with a vascular specialist.  -LEG CRAMPS: Your leg cramps, especially at night, may be related to your peripheral vascular disease or prolonged standing. Your potassium levels are normal. We are prescribing Flexeril  to take at bedtime to help with the cramps. Keep the medication on your nightstand to help you remember to take it.  -HYPERTENSION: Your high blood pressure is being managed with lisinopril . It is safe for you to drink Gatorade while on this medication. Please continue taking lisinopril  as prescribed and ensure you have enough refills.  INSTRUCTIONS:  Please follow up with an endocrinologist for your diabetes management. If your leg symptoms worsen, schedule an appointment with a vascular specialist. Continue taking your medications as prescribed and monitor your blood sugar levels regularly.

## 2024-01-16 NOTE — Progress Notes (Signed)
 Subjective:  Patient ID: Cheryl Parker, female    DOB: March 03, 1981  Age: 43 y.o. MRN: [id number]  CC: Medical Management of Chronic Issues     Discussed the use of AI scribe software for clinical note transcription with the patient, who gave verbal consent to proceed.  History of Present Illness Cheryl Parker is a 43 year old female with type 2 diabetes, hyperlipidemia, hypertension, peripheral vascular disease who presents for diabetes management.  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 She often forgets to take her 55 units of Lantus  at night. Her A1c has increased from 11.1 to 11.7. She takes Humalog  15 units three times a day but administers it only twice due to eating two meals a day. Blood sugar levels are around 230-250 mg/dL in the morning and before dinner, previously in the high 300s.  She experiences severe leg cramps, particularly at night, and consumes bananas and Gatorade to manage these symptoms. Her last potassium level was normal. She has a history of peripheral vascular disease and circulation problems in her legs, with leg pain present.  Vascular note from 2024 reviewed which indicates due to the fact that she did not have rest pain or ulcers no intervention recommended at this time but medical management.  She is on lisinopril  for high blood pressure and is adherent with her statin.    Past Medical History:  Diagnosis Date   DKA (diabetic ketoacidoses) 09/10/2014; 06/02/2017   GERD (gastroesophageal reflux disease)    Gestational diabetes    Headache    Hypertension    Nausea & vomiting 07/2017   Pregnancy induced hypertension    Type 1 diabetes mellitus (HCC)    Insulin  dependent    Past Surgical History:  Procedure Laterality Date   CESAREAN SECTION  2011   CESAREAN SECTION N/A 07/05/2012   Procedure: CESAREAN SECTION;  Surgeon: Winton Felt, MD;  Location: [facility];  Service:  Obstetrics;  Laterality: N/A;   CESAREAN SECTION N/A 06/25/2015   Procedure: CESAREAN SECTION;  Surgeon: Winton Felt, MD;  Location: [facility];  Service: Obstetrics;  Laterality: N/A;   ESOPHAGOGASTRODUODENOSCOPY (EGD) WITH PROPOFOL  Left 12/28/2016   Procedure: ESOPHAGOGASTRODUODENOSCOPY (EGD) WITH PROPOFOL ;  Surgeon: Saintclair Jasper, MD;  Location: [facility];  Service: Gastroenterology;  Laterality: Left;    Family History  Problem Relation Age of Onset   Diabetes Mother    Kidney disease Father    Birth defects Son        unilateral renal agenesis    Social History   Socioeconomic History   Marital status: Single    Spouse name: Not on file   Number of children: Not on file   Years of education: Not on file   Highest education level: 12th grade  Occupational History   Not on file  Tobacco Use   Smoking status: Former    Current packs/day: 0.10    Average packs/day: 0.1 packs/day for 17.0 years (1.7 ttl pk-yrs)    Types: Cigarettes   Smokeless tobacco: Never   Tobacco comments:    06/02/2017 quit ~ 6 months ago  Vaping Use   Vaping status: Never Used  Substance and Sexual Activity   Alcohol use: No   Drug use: No   Sexual activity: Not on file  Other Topics Concern   Not on file  Social History Narrative   Not on file   Social Drivers of Health   Financial Resource Strain: Low  Risk  (10/24/2023)   Overall Financial Resource Strain (CARDIA)    Difficulty of Paying Living Expenses: Not hard at all  Food Insecurity: No Food Insecurity (10/24/2023)   Hunger Vital Sign    Worried About Running Out of Food in the Last Year: Never true    Ran Out of Food in the Last Year: Never true  Transportation Needs: No Transportation Needs (10/24/2023)   PRAPARE - Administrator, Civil Service (Medical): No    Lack of Transportation (Non-Medical): No  Physical Activity: Sufficiently Active (10/24/2023)   Exercise Vital Sign    Days of Exercise per Week: 3 days    Minutes  of Exercise per Session: 70 min  Stress: No Stress Concern Present (10/24/2023)   [employer] of Occupational Health - Occupational Stress Questionnaire    Feeling of Stress: Not at all  Social Connections: Moderately Integrated (10/24/2023)   Social Connection and Isolation Panel    Frequency of Communication with Friends and Family: More than three times a week    Frequency of Social Gatherings with Friends and Family: Three times a week    Attends Religious Services: More than 4 times per year    Active Member of Clubs or Organizations: No    Attends Engineer, structural: Not on file    Marital Status: Living with partner    Allergies  Allergen Reactions   Insulin  Aspart Prot & Aspart Hives and Itching    Immediately after taking, developed itching and hives    Outpatient Medications Prior to Visit  Medication Sig Dispense Refill   Accu-Chek Softclix Lancets lancets Use to check blood sugar 3 times daily. E11.65 100 each 6   Blood Glucose Monitoring Suppl (ACCU-CHEK GUIDE) w/Device KIT Use to check blood sugar 3 times daily. E11.65 1 kit 0   glucose blood (ACCU-CHEK GUIDE TEST) test strip TEST THREE TIMES DAILY 200 strip 11   lisinopril  (ZESTRIL ) 10 MG tablet Take 1 tablet (10 mg total) by mouth daily. 90 tablet 1   Insulin  Glargine Solostar (LANTUS ) 100 UNIT/ML Solostar Pen Inject 55 Units into the skin daily. 45 mL 1   insulin  lispro (HUMALOG  KWIKPEN) 100 UNIT/ML KwikPen Inject 15 Units into the skin 3 (three) times daily before meals. 60 mL 1   Insulin  Pen Needle (BD PEN NEEDLE SHORT ULTRAFINE) 31G X 8 MM MISC Use to inject insulin  four times daily. 400 each 1   rosuvastatin  (CRESTOR ) 20 MG tablet Take 1 tablet (20 mg total) by mouth daily. 90 tablet 1   cilostazol  (PLETAL ) 50 MG tablet Take 1 tablet (50 mg total) by mouth 2 (two) times daily. (Patient not taking: Reported on 01/16/2024) 180 tablet 1   Continuous Glucose Receiver (FREESTYLE LIBRE 2 READER) DEVI Check  blood sugar continuously. Dx: E11.65 (Patient not taking: Reported on 01/16/2024) 1 each 0   Continuous Glucose Sensor (FREESTYLE LIBRE 2 SENSOR) MISC Check blood sugar continuously. Change sensors once every 14 days. Dx: E11.65 (Patient not taking: Reported on 01/16/2024) 3 each 11   No facility-administered medications prior to visit.     ROS Review of Systems  Constitutional:  Negative for activity change and appetite change.  HENT:  Negative for sinus pressure and sore throat.   Respiratory:  Negative for chest tightness, shortness of breath and wheezing.   Cardiovascular:  Negative for chest pain and palpitations.  Gastrointestinal:  Negative for abdominal distention, abdominal pain and constipation.  Genitourinary: Negative.   Musculoskeletal:  See HPI  Psychiatric/Behavioral:  Negative for behavioral problems and dysphoric mood.     Objective:  BP 126/85   Pulse (!) 105   Ht 5' 2 (1.575 m)   Wt 169 lb 9.6 oz (76.9 kg)   SpO2 98%   BMI 31.02 kg/m      01/16/2024    2:09 PM 11/14/2023   11:04 AM 09/01/2023   10:38 AM  BP/Weight  Systolic BP 126 144 146  Diastolic BP 85 88 89  Wt. (Lbs) 169.6 171.6   BMI 31.02 kg/m2 31.39 kg/m2       Physical Exam Constitutional:      Appearance: She is well-developed.  Cardiovascular:     Rate and Rhythm: Tachycardia present.     Heart sounds: Normal heart sounds. No murmur heard. Pulmonary:     Effort: Pulmonary effort is normal.     Breath sounds: Normal breath sounds. No wheezing or rales.  Chest:     Chest wall: No tenderness.  Abdominal:     General: Bowel sounds are normal. There is no distension.     Palpations: Abdomen is soft. There is no mass.     Tenderness: There is no abdominal tenderness.  Musculoskeletal:        General: Normal range of motion.     Right lower leg: No edema.     Left lower leg: No edema.  Neurological:     Mental Status: She is alert and oriented to person, place, and time.   Psychiatric:        Mood and Affect: Mood normal.        Latest Ref Rng & Units 11/14/2023   11:43 AM 09/01/2023   10:46 AM 01/19/2023    9:20 AM  CMP  Glucose 70 - 99 mg/dL 834  784  793   BUN 6 - 24 mg/dL 8  6  9    Creatinine 0.57 - 1.00 mg/dL 9.28  9.31  9.39   Sodium 134 - 144 mmol/L 138  136  135   Potassium 3.5 - 5.2 mmol/L 4.5  4.0  4.2   Chloride 96 - 106 mmol/L 102  98  96   CO2 20 - 29 mmol/L 18  22  23    Calcium  8.7 - 10.2 mg/dL 9.5  9.5  9.7   Total Protein 6.0 - 8.5 g/dL 6.8  6.4  6.4   Total Bilirubin 0.0 - 1.2 mg/dL 0.4  0.3  <9.7   Alkaline Phos 44 - 121 IU/L 193  231  129   AST 0 - 40 IU/L 58  72  33   ALT 0 - 32 IU/L 74  124  28     Lipid Panel     Component Value Date/Time   CHOL 250 (H) 09/01/2023 1046   TRIG 189 (H) 09/01/2023 1046   HDL 57 09/01/2023 1046   CHOLHDL 4.6 (H) 06/25/2021 1133   CHOLHDL 9.2 12/27/2016 0343   VLDL 19 12/27/2016 0343   LDLCALC 159 (H) 09/01/2023 1046    CBC    Component Value Date/Time   WBC 11.4 (H) 01/19/2023 0920   WBC 8.4 11/11/2019 0621   RBC 4.49 01/19/2023 0920   RBC 3.70 (L) 11/11/2019 0621   HGB 14.5 01/19/2023 0920   HGB 12.8 01/10/2012 0000   HCT 44.3 01/19/2023 0920   PLT 298 01/19/2023 0920   PLT 372 01/10/2012 0000   MCV 99 (H) 01/19/2023 0920   MCH 32.3 01/19/2023 0920  MCH 31.4 11/11/2019 0621   MCHC 32.7 01/19/2023 0920   MCHC 32.7 11/11/2019 0621   RDW 11.8 01/19/2023 0920   LYMPHSABS 2.6 01/19/2023 0920   MONOABS 0.8 11/11/2019 0621   EOSABS 0.0 01/19/2023 0920   BASOSABS 0.0 01/19/2023 0920    Lab Results  Component Value Date   HGBA1C 11.7 (A) 01/16/2024    Lab Results  Component Value Date   HGBA1C 11.7 (A) 01/16/2024   HGBA1C 11.1 (A) 09/01/2023   HGBA1C 10.8 (A) 06/07/2023       Assessment & Plan Type 2 diabetes mellitus with hyperglycemia Poorly controlled with A1c of 11.7. Blood glucose 230-250 mg/dL. Difficulty with insulin  adherence due to schedule. Discontinued  Ozempic  due to adverse effects. Risks include cardiovascular and renal complications. - Switch Lantus  to 60 units in the morning given the fact that she forgets to take it at night - Refer to endocrinologist. - Adjust Humalog  to 15 units with meals, contingent on meal consumption. - Increase Lantus  to 62 units if blood glucose remains high. - Educated on regular insulin  administration and dietary management. - Ensure refills for insulin  pen needles and supplies. - Also being seen by the clinical pharmacist with an upcoming appointment in 2 weeks.  Peripheral vascular disease  Previous evaluation showed no immediate intervention needed unless symptoms worsen. Uncontrolled diabetes can exacerbate. - Manage diabetes to prevent worsening circulation. -Secondary risk factor modification including statin use - Follow up with vascular specialist if symptoms worsen.  Leg cramps Occur at night, possibly related to peripheral vascular disease or prolonged standing. Potassium levels normal. - Prescribe Flexeril  at bedtime.   Hypertension Lisinopril  prescribed. Gatorade consumption confirmed safe. - Continue lisinopril . - Ensure refills for lisinopril .       Meds ordered this encounter  Medications   Insulin  Pen Needle (BD PEN NEEDLE SHORT ULTRAFINE) 31G X 8 MM MISC    Sig: Use to inject insulin  four times daily.    Dispense:  400 each    Refill:  1   insulin  lispro (HUMALOG  KWIKPEN) 100 UNIT/ML KwikPen    Sig: Inject 15 Units into the skin 3 (three) times daily before meals.    Dispense:  60 mL    Refill:  1   Insulin  Glargine Solostar (LANTUS ) 100 UNIT/ML Solostar Pen    Sig: Inject 60 Units into the skin daily.    Dispense:  45 mL    Refill:  1    Dose increase   cyclobenzaprine  (FLEXERIL ) 10 MG tablet    Sig: Take 1 tablet (10 mg total) by mouth at bedtime as needed for muscle spasms.    Dispense:  30 tablet    Refill:  1   rosuvastatin  (CRESTOR ) 20 MG tablet    Sig: Take 1  tablet (20 mg total) by mouth daily.    Dispense:  90 tablet    Refill:  1    Dose increase    Follow-up: Return in about 3 months (around 04/16/2024) for Chronic medical conditions.       Corrina Sabin, MD, FAAFP. [facility] [place], [place] [phone]   01/16/2024, 3:32 PM

## 2024-01-30 ENCOUNTER — Telehealth: Payer: Self-pay | Admitting: Family Medicine

## 2024-01-30 ENCOUNTER — Ambulatory Visit: Admitting: Pharmacist

## 2024-01-30 NOTE — Telephone Encounter (Signed)
 Confirmed appt for 10/7

## 2024-01-31 ENCOUNTER — Ambulatory Visit: Attending: Family Medicine | Admitting: Pharmacist

## 2024-01-31 ENCOUNTER — Encounter: Payer: Self-pay | Admitting: Pharmacist

## 2024-01-31 ENCOUNTER — Other Ambulatory Visit: Payer: Self-pay

## 2024-01-31 DIAGNOSIS — Z23 Encounter for immunization: Secondary | ICD-10-CM | POA: Diagnosis not present

## 2024-01-31 DIAGNOSIS — Z794 Long term (current) use of insulin: Secondary | ICD-10-CM | POA: Diagnosis not present

## 2024-01-31 DIAGNOSIS — E1165 Type 2 diabetes mellitus with hyperglycemia: Secondary | ICD-10-CM

## 2024-01-31 MED ORDER — ACCU-CHEK GUIDE TEST VI STRP: ORAL_STRIP | 6 refills | Status: AC

## 2024-01-31 MED ORDER — FREESTYLE LIBRE 3 PLUS SENSOR MISC: 6 refills | Status: AC

## 2024-01-31 MED ORDER — FREESTYLE LIBRE 3 READER DEVI: 0 refills | Status: AC

## 2024-01-31 MED ORDER — ACCU-CHEK SOFTCLIX LANCETS MISC: 6 refills | Status: AC

## 2024-01-31 MED ORDER — ACCU-CHEK GUIDE W/DEVICE KIT: PACK | 0 refills | Status: AC

## 2024-01-31 NOTE — Progress Notes (Signed)
 S:     No chief complaint on file.  Cheryl Parker is a 43 y.o. female who presents for diabetes evaluation, education, and management. PMH is significant for HTN, T2DM (complicated w/ hx of DKA). Patient was referred and last seen by Primary Care Provider, Dr. Delbert, on 01/16/2024. Pharmacy has seen pt before with her most recent visit occurring on 10/27/23.  Her A1c at Dr. Millard visit was 11.7% (up from 11.1% prior). Lantus  was increased from 55 to 60 units daily.   Today, patient arrives in good spirits and presents without any assistance.  Family/Social History:  -Fhx: DM, kidney disease  -Tobacco: former smoker  -Alcohol: none reported   Current diabetes medications include: Lantus  60 units daily (taking 55 units), Humalog  15u BID before meals  Patient reports medication adherence.  Insurance coverage: [place] Medicaid   Patient denies hypoglycemic events.  Patient denies nocturia (nighttime urination).  Patient denies neuropathy (nerve pain). Patient denies visual changes. Patient reports self foot exams.   Patient reported dietary habits:  -Denies any changes since I saw her last -Previously reported dietary habits are as follows:  -Eats 2 meals/day Breakfast: [place] bacon, eggs, piece of bread, may have cereal Lunch: skips lately, usually eats leftovers otherwise Dinner: burger, hot dog, green beans, salad, usually eats at home Snacks: crackers, chips, apple, orange Drinks: water, sometimes coffee, stopped drinking sodas 1.5 years ago  Patient-reported exercise habits: on her feet daily with childcare, walks a few times a day  O:   ROS  Physical Exam  7 day average blood glucose: no meter with her today  CGM is not in place.   Lab Results  Component Value Date   HGBA1C 11.7 (A) 01/16/2024   There were no vitals filed for this visit.  Lipid Panel     Component Value Date/Time   CHOL 250 (H) 09/01/2023 1046   TRIG 189 (H) 09/01/2023 1046   HDL 57  09/01/2023 1046   CHOLHDL 4.6 (H) 06/25/2021 1133   CHOLHDL 9.2 12/27/2016 0343   VLDL 19 12/27/2016 0343   LDLCALC 159 (H) 09/01/2023 1046    Clinical Atherosclerotic Cardiovascular Disease (ASCVD): No  The 10-year ASCVD risk score (Arnett DK, et al., 2019) is: 4.9%   Values used to calculate the score:     Age: 41 years     Clincally relevant sex: Female     Is Non-Hispanic African American: Yes     Diabetic: Yes     Tobacco smoker: No     Systolic Blood Pressure: 126 mmHg     Is BP treated: Yes     HDL Cholesterol: 57 mg/dL     Total Cholesterol: 250 mg/dL   Patient is participating in a Managed Medicaid Plan:  Yes   A/P: Diabetes longstanding currently uncontrolled. Will send in updated rxns for Accu Chek supplies and Libre 3 Plus CGM. We will keep insulin  doses the same until I have meter information to review. Of note she is interested in retrying Ozempic . I will look into this to see if this is an option for her and review at her follow-up next month. Patient is able to verbalize appropriate hypoglycemia management plan but is not currently hypoglycemic. -Continue Lantus  55 units daily.  -Continue Humalog  15u BID before meals.  -Patient educated on purpose, proper use, and potential adverse effects of Lantus , Humalog .  -Extensively discussed pathophysiology of diabetes, recommended lifestyle interventions, dietary effects on blood sugar control.  -Counseled on s/sx of and  management of hypoglycemia.  -Next A1c 03/2024.  Written patient instructions provided. Patient verbalized understanding of treatment plan. Total time in face to face counseling 30 minutes.    Follow-up:  Pharmacist in 1 months.   Herlene Fleeta Morris, PharmD, JAQUELINE, CPP Clinical Pharmacist [facility] [phone]

## 2024-02-06 ENCOUNTER — Other Ambulatory Visit: Payer: Self-pay

## 2024-02-12 ENCOUNTER — Other Ambulatory Visit: Payer: Self-pay

## 2024-02-12 ENCOUNTER — Encounter (HOSPITAL_BASED_OUTPATIENT_CLINIC_OR_DEPARTMENT_OTHER): Payer: Self-pay | Admitting: Emergency Medicine

## 2024-02-12 ENCOUNTER — Emergency Department (HOSPITAL_BASED_OUTPATIENT_CLINIC_OR_DEPARTMENT_OTHER)
Admission: EM | Admit: 2024-02-12 | Discharge: 2024-02-12 | Disposition: A | Discharge status: Home / Self Care | Attending: Emergency Medicine | Admitting: Emergency Medicine

## 2024-02-12 DIAGNOSIS — Z79899 Other long term (current) drug therapy: Secondary | ICD-10-CM | POA: Diagnosis not present

## 2024-02-12 DIAGNOSIS — K0381 Cracked tooth: Secondary | ICD-10-CM | POA: Diagnosis not present

## 2024-02-12 DIAGNOSIS — K0889 Other specified disorders of teeth and supporting structures: Secondary | ICD-10-CM

## 2024-02-12 DIAGNOSIS — Z794 Long term (current) use of insulin: Secondary | ICD-10-CM | POA: Insufficient documentation

## 2024-02-12 MED ORDER — OXYCODONE-ACETAMINOPHEN 5-325 MG PO TABS
2.0000 | ORAL_TABLET | Freq: Once | ORAL | Status: AC
  Administered 2024-02-12: 2 via ORAL
  Filled 2024-02-12: qty 2

## 2024-02-12 MED ORDER — OXYCODONE-ACETAMINOPHEN 5-325 MG PO TABS: 1.0000 | ORAL_TABLET | Freq: Four times a day (QID) | ORAL | 0 refills | Status: AC | PRN

## 2024-02-12 MED ORDER — IBUPROFEN 600 MG PO TABS
600.0000 mg | ORAL_TABLET | Freq: Four times a day (QID) | ORAL | 0 refills | Status: AC | PRN
Start: 2024-02-12 — End: ?

## 2024-02-12 NOTE — Discharge Instructions (Signed)
 As discussed, please follow-up with your dentist within the next week for definitive management of your broken tooth.

## 2024-02-12 NOTE — ED Triage Notes (Signed)
 Pt c/o dental pain (RT upper) x 3d d/t broken tooth; c/o facial pain; BP elevated, sts she is compliant with meds

## 2024-02-12 NOTE — ED Notes (Signed)
 Pt alert and oriented X 4 at the time of discharge. RR even and unlabored. No acute distress noted. Pt verbalized understanding of discharge instructions as discussed. Pt ambulatory to lobby at time of discharge.

## 2024-02-12 NOTE — ED Provider Notes (Signed)
 [facility] [facility] Provider Note   CSN: [id number] Arrival date & time: 02/12/24  1244     Patient presents with: Dental Pain   Cheryl Parker is a 43 y.o. female dental pain due to a broken mandibular incisor.  Completely fracture from the root approximately 3 days ago has had significant discomfort since.  Has pain going up through the maxilla into the right side of the nose.  Has had no dental follow-up as of yet.    Dental Pain      Prior to Admission medications   Medication Sig Start Date End Date Taking? Authorizing Provider  ibuprofen  (ADVIL ) 600 MG tablet Take 1 tablet (600 mg total) by mouth every 6 (six) hours as needed. 02/12/24  Yes Myriam Dorn BROCKS, PA  oxyCODONE -acetaminophen  (PERCOCET/ROXICET) 5-325 MG tablet Take 1 tablet by mouth every 6 (six) hours as needed for severe pain (pain score 7-10). 02/12/24  Yes Myriam Dorn BROCKS, PA  Accu-Chek Softclix Lancets lancets Use to check blood sugar 3 times daily. 01/31/24   Newlin, Enobong, MD  Blood Glucose Monitoring Suppl (ACCU-CHEK GUIDE) w/Device KIT Use to check blood sugar 3 times daily. 01/31/24   Newlin, Enobong, MD  cilostazol  (PLETAL ) 50 MG tablet Take 1 tablet (50 mg total) by mouth 2 (two) times daily. Patient not taking: Reported on 01/16/2024 09/01/23   Newlin, Enobong, MD  Continuous Glucose Receiver (FREESTYLE LIBRE 3 READER) DEVI Use to check blood sugar continuously throughout the day. 01/31/24   Newlin, Enobong, MD  Continuous Glucose Sensor (FREESTYLE LIBRE 3 PLUS SENSOR) MISC Change sensor every 15 days. Use to check blood sugar continuously. 01/31/24   Newlin, Enobong, MD  cyclobenzaprine  (FLEXERIL ) 10 MG tablet Take 1 tablet (10 mg total) by mouth at bedtime as needed for muscle spasms. 01/16/24   Newlin, Enobong, MD  glucose blood (ACCU-CHEK GUIDE TEST) test strip Use to check blood sugar 3 times daily. 01/31/24   Newlin, Enobong, MD  Insulin  Glargine Solostar  (LANTUS ) 100 UNIT/ML Solostar Pen Inject 60 Units into the skin daily. 01/16/24   Newlin, Enobong, MD  insulin  lispro (HUMALOG  KWIKPEN) 100 UNIT/ML KwikPen Inject 15 Units into the skin 3 (three) times daily before meals. 01/16/24   Newlin, Enobong, MD  Insulin  Pen Needle (BD PEN NEEDLE SHORT ULTRAFINE) 31G X 8 MM MISC Use to inject insulin  four times daily. 01/16/24   Newlin, Enobong, MD  lisinopril  (ZESTRIL ) 10 MG tablet Take 1 tablet (10 mg total) by mouth daily. 09/01/23   Newlin, Enobong, MD  rosuvastatin  (CRESTOR ) 20 MG tablet Take 1 tablet (20 mg total) by mouth daily. 01/16/24   Newlin, Enobong, MD    Allergies: Insulin  aspart prot & aspart    Review of Systems  HENT:  Positive for dental problem.   All other systems reviewed and are negative.   Updated Vital Signs BP (!) 191/111   Pulse (!) 116   Temp (!) 97.2 F (36.2 C)   Resp 20   Ht 5' 2 (1.575 m)   Wt 74.8 kg   SpO2 100%   BMI 30.18 kg/m   Physical Exam Vitals and nursing note reviewed.  Constitutional:      General: She is not in acute distress.    Appearance: She is well-developed.  HENT:     Head: Normocephalic and atraumatic.     Comments: Tenderness appreciated along the right side of the maxilla going up into the right paranasal structures.    Mouth/Throat:  Lips: Pink.     Mouth: Mucous membranes are moist.     Dentition: Abnormal dentition.      Comments: Incisor fractured all the way to the gingiva, obvious infected pulp is visible. Eyes:     Conjunctiva/sclera: Conjunctivae normal.  Cardiovascular:     Rate and Rhythm: Normal rate and regular rhythm.     Heart sounds: No murmur heard. Pulmonary:     Effort: Pulmonary effort is normal. No respiratory distress.     Breath sounds: Normal breath sounds.  Abdominal:     Palpations: Abdomen is soft.     Tenderness: There is no abdominal tenderness.  Musculoskeletal:        General: No swelling.     Cervical back: Neck supple.  Skin:    General:  Skin is warm and dry.     Capillary Refill: Capillary refill takes less than 2 seconds.  Neurological:     Mental Status: She is alert.  Psychiatric:        Mood and Affect: Mood normal.     (all labs ordered are listed, but only abnormal results are displayed) Labs Reviewed - No data to display  EKG: None  Radiology: No results found.   Procedures   Medications Ordered in the ED  oxyCODONE -acetaminophen  (PERCOCET/ROXICET) 5-325 MG per tablet 2 tablet (has no administration in time range)                                    Medical Decision Making Risk Prescription drug management.   With acute fracture tooth along with tenderness in the maxilla, consistent with likely dental infection and dental pain associated with this.  Will manage infection with amoxicillin  prescription, manage pain with ibuprofen  along with a prescription for oxycodone  to be used for pain above and beyond pain managed with ibuprofen .  Stressed need for dental follow-up within the next week, patient understands and agrees has no further concerns at this time.  As vital signs are stable within normal limits, and there are no other concerning findings on physical exam, defer further workup at this time and will discharge patient with outpatient follow-up.     Final diagnoses:  Pain, dental    ED Discharge Orders          Ordered    oxyCODONE -acetaminophen  (PERCOCET/ROXICET) 5-325 MG tablet  Every 6 hours PRN        02/12/24 1521    ibuprofen  (ADVIL ) 600 MG tablet  Every 6 hours PRN        02/12/24 1521               Myriam Dorn BROCKS, PA 02/12/24 1524    Darra Fonda MATSU, MD 02/22/24 [phone]

## 2024-03-02 ENCOUNTER — Ambulatory Visit: Admitting: Pharmacist

## 2024-03-07 ENCOUNTER — Other Ambulatory Visit: Payer: Self-pay | Admitting: Family Medicine

## 2024-03-07 DIAGNOSIS — I10 Essential (primary) hypertension: Secondary | ICD-10-CM

## 2024-03-27 ENCOUNTER — Ambulatory Visit: Admitting: Pharmacist

## 2024-04-16 ENCOUNTER — Ambulatory Visit: Admitting: Family Medicine

## 2024-05-03 ENCOUNTER — Ambulatory Visit: Admitting: Pharmacist

## 2024-05-08 ENCOUNTER — Other Ambulatory Visit: Payer: Self-pay | Admitting: Family Medicine

## 2024-05-08 DIAGNOSIS — E1165 Type 2 diabetes mellitus with hyperglycemia: Secondary | ICD-10-CM

## 2024-05-08 NOTE — Telephone Encounter (Unsigned)
 Copied from CRM [id number]. Topic: Clinical - Medication Refill >> May 08, 2024  9:12 AM Kendralyn S wrote: Medication: Insulin  Glargine Solostar (LANTUS ) 100 UNIT/ML Solostar Pen insulin  lispro (HUMALOG  KWIKPEN) 100 UNIT/ML KwikPen  Has the patient contacted their pharmacy? Yes (Agent: If no, request that the patient contact the pharmacy for the refill. If patient does not wish to contact the pharmacy document the reason why and proceed with request.) (Agent: If yes, when and what did the pharmacy advise?)  This is the patient's preferred pharmacy:  [id number] [place] - [address] AT [facility] OF [facility] [address] [facility] [address] Phone: [phone] Fax: [phone]  Is this the correct pharmacy for this prescription? Yes If no, delete pharmacy and type the correct one.   Has the prescription been filled recently? No  Is the patient out of the medication? No  Has the patient been seen for an appointment in the last year OR does the patient have an upcoming appointment? Yes  Can we respond through MyChart? Yes  Agent: Please be advised that Rx refills may take up to 3 business days. We ask that you follow-up with your pharmacy.

## 2024-05-09 MED ORDER — INSULIN GLARGINE SOLOSTAR 100 UNIT/ML ~~LOC~~ SOPN: 60.0000 [IU] | PEN_INJECTOR | Freq: Every day | SUBCUTANEOUS | 0 refills | Status: AC

## 2024-05-09 MED ORDER — INSULIN LISPRO (1 UNIT DIAL) 100 UNIT/ML (KWIKPEN): 15.0000 [IU] | PEN_INJECTOR | Freq: Three times a day (TID) | SUBCUTANEOUS | 0 refills | Status: AC

## 2024-05-09 NOTE — Telephone Encounter (Signed)
 Requested Prescriptions  Pending Prescriptions Disp Refills   Insulin  Glargine Solostar (LANTUS ) 100 UNIT/ML Solostar Pen 45 mL 0    Sig: Inject 60 Units into the skin daily.     Endocrinology:  Diabetes - Insulins Failed - 05/09/2024  9:17 AM      Failed - HBA1C is between 0 and 7.9 and within 180 days    HbA1c, POC (controlled diabetic range)  Date Value Ref Range Status  01/16/2024 11.7 (A) 0.0 - 7.0 % Final         Passed - Valid encounter within last 6 months    Recent Outpatient Visits           3 months ago Type 2 diabetes mellitus with hyperglycemia, with long-term current use of insulin  [facility])   [facility] [place]. [facility] Fleeta Morris, [place] L, RPH-CPP   3 months ago Type 2 diabetes mellitus with hyperglycemia, with long-term current use of insulin  [facility])   [facility] [place]. [facility] Delbert Clam, MD   5 months ago Type 2 diabetes mellitus with hyperglycemia, with long-term current use of insulin  [facility]   [facility] [facility] [place]. [facility] Delbert Clam, MD   6 months ago Type 2 diabetes mellitus with hyperglycemia, with long-term current use of insulin  [facility])   [facility] [place]. [facility] Fleeta Morris, [place] L, RPH-CPP   8 months ago Type 2 diabetes mellitus with hyperglycemia, with long-term current use of insulin  [facility])   [facility] Dept Of [place]. [facility] [place], Clam, MD               insulin  lispro (HUMALOG  KWIKPEN) 100 UNIT/ML KwikPen 60 mL 0    Sig: Inject 15 Units into the skin 3 (three) times daily before meals.     Endocrinology:  Diabetes - Insulins Failed - 05/09/2024  9:17 AM      Failed - HBA1C is between 0 and 7.9 and within 180 days    HbA1c, POC (controlled diabetic range)  Date Value Ref Range Status  01/16/2024 11.7 (A)  0.0 - 7.0 % Final         Passed - Valid encounter within last 6 months    Recent Outpatient Visits           3 months ago Type 2 diabetes mellitus with hyperglycemia, with long-term current use of insulin  (HCC)   [facility] [place]. [facility] Fleeta Morris, [place] L, RPH-CPP   3 months ago Type 2 diabetes mellitus with hyperglycemia, with long-term current use of insulin  [facility])   [facility] [place]. [facility] Delbert Clam, MD   5 months ago Type 2 diabetes mellitus with hyperglycemia, with long-term current use of insulin  [facility])   [place] [facility] [place]. [facility] Delbert Clam, MD   6 months ago Type 2 diabetes mellitus with hyperglycemia, with long-term current use of insulin  [facility])   [place] [place]. [facility] Fleeta Morris, [place] L, RPH-CPP   8 months ago Type 2 diabetes mellitus with hyperglycemia, with long-term current use of insulin  (  HCC)   [place] [facility] [place]. [facility] Delbert Clam, MD

## 2024-05-15 ENCOUNTER — Ambulatory Visit: Admitting: Family Medicine

## 2024-05-17 ENCOUNTER — Other Ambulatory Visit: Payer: Self-pay

## 2024-05-18 ENCOUNTER — Ambulatory Visit: Admitting: Pharmacist

## 2024-06-19 ENCOUNTER — Ambulatory Visit: Admitting: Family Medicine

## 2024-06-21 ENCOUNTER — Ambulatory Visit: Admitting: Pharmacist
